# Patient Record
Sex: Male | Born: 1945 | Race: White | Hispanic: No | Marital: Married | Smoking: Current every day smoker
Health system: Southern US, Community
[De-identification: ages and names within clinical notes are randomized; demographics above are authoritative.]

## PROBLEM LIST (undated history)

## (undated) DIAGNOSIS — I6789 Other cerebrovascular disease: Secondary | ICD-10-CM

## (undated) DIAGNOSIS — M549 Dorsalgia, unspecified: Secondary | ICD-10-CM

## (undated) DIAGNOSIS — Z974 Presence of external hearing-aid: Secondary | ICD-10-CM

## (undated) DIAGNOSIS — G8929 Other chronic pain: Secondary | ICD-10-CM

## (undated) DIAGNOSIS — D719 Functional disorders of polymorphonuclear neutrophils, unspecified: Secondary | ICD-10-CM

## (undated) DIAGNOSIS — Z972 Presence of dental prosthetic device (complete) (partial): Secondary | ICD-10-CM

## (undated) DIAGNOSIS — I509 Heart failure, unspecified: Secondary | ICD-10-CM

## (undated) DIAGNOSIS — J439 Emphysema, unspecified: Secondary | ICD-10-CM

## (undated) DIAGNOSIS — I219 Acute myocardial infarction, unspecified: Secondary | ICD-10-CM

## (undated) DIAGNOSIS — I700 Atherosclerosis of aorta: Secondary | ICD-10-CM

## (undated) DIAGNOSIS — E785 Hyperlipidemia, unspecified: Secondary | ICD-10-CM

## (undated) DIAGNOSIS — I10 Essential (primary) hypertension: Secondary | ICD-10-CM

## (undated) DIAGNOSIS — G5601 Carpal tunnel syndrome, right upper limb: Secondary | ICD-10-CM

## (undated) DIAGNOSIS — K449 Diaphragmatic hernia without obstruction or gangrene: Secondary | ICD-10-CM

## (undated) DIAGNOSIS — Q7649 Other congenital malformations of spine, not associated with scoliosis: Secondary | ICD-10-CM

## (undated) DIAGNOSIS — M5416 Radiculopathy, lumbar region: Secondary | ICD-10-CM

## (undated) DIAGNOSIS — K219 Gastro-esophageal reflux disease without esophagitis: Secondary | ICD-10-CM

## (undated) DIAGNOSIS — G8314 Monoplegia of lower limb affecting left nondominant side: Secondary | ICD-10-CM

## (undated) DIAGNOSIS — M1990 Unspecified osteoarthritis, unspecified site: Secondary | ICD-10-CM

## (undated) DIAGNOSIS — L8990 Pressure ulcer of unspecified site, unspecified stage: Secondary | ICD-10-CM

## (undated) DIAGNOSIS — M5136 Other intervertebral disc degeneration, lumbar region: Secondary | ICD-10-CM

## (undated) DIAGNOSIS — J019 Acute sinusitis, unspecified: Secondary | ICD-10-CM

## (undated) DIAGNOSIS — E7800 Pure hypercholesterolemia, unspecified: Secondary | ICD-10-CM

## (undated) DIAGNOSIS — K08109 Complete loss of teeth, unspecified cause, unspecified class: Secondary | ICD-10-CM

## (undated) DIAGNOSIS — Z7902 Long term (current) use of antithrombotics/antiplatelets: Secondary | ICD-10-CM

## (undated) DIAGNOSIS — K7460 Unspecified cirrhosis of liver: Secondary | ICD-10-CM

## (undated) DIAGNOSIS — I2510 Atherosclerotic heart disease of native coronary artery without angina pectoris: Secondary | ICD-10-CM

## (undated) DIAGNOSIS — I429 Cardiomyopathy, unspecified: Secondary | ICD-10-CM

## (undated) DIAGNOSIS — H259 Unspecified age-related cataract: Secondary | ICD-10-CM

## (undated) DIAGNOSIS — K2970 Gastritis, unspecified, without bleeding: Secondary | ICD-10-CM

## (undated) DIAGNOSIS — H9190 Unspecified hearing loss, unspecified ear: Secondary | ICD-10-CM

## (undated) DIAGNOSIS — H6120 Impacted cerumen, unspecified ear: Secondary | ICD-10-CM

## (undated) DIAGNOSIS — I5030 Unspecified diastolic (congestive) heart failure: Secondary | ICD-10-CM

## (undated) DIAGNOSIS — I739 Peripheral vascular disease, unspecified: Secondary | ICD-10-CM

## (undated) DIAGNOSIS — G5600 Carpal tunnel syndrome, unspecified upper limb: Secondary | ICD-10-CM

## (undated) DIAGNOSIS — M1612 Unilateral primary osteoarthritis, left hip: Secondary | ICD-10-CM

## (undated) DIAGNOSIS — I209 Angina pectoris, unspecified: Secondary | ICD-10-CM

## (undated) DIAGNOSIS — K5790 Diverticulosis of intestine, part unspecified, without perforation or abscess without bleeding: Secondary | ICD-10-CM

## (undated) DIAGNOSIS — G629 Polyneuropathy, unspecified: Secondary | ICD-10-CM

## (undated) DIAGNOSIS — M795 Residual foreign body in soft tissue: Secondary | ICD-10-CM

## (undated) DIAGNOSIS — G4701 Insomnia due to medical condition: Secondary | ICD-10-CM

## (undated) DIAGNOSIS — R918 Other nonspecific abnormal finding of lung field: Secondary | ICD-10-CM

## (undated) DIAGNOSIS — K2289 Other specified disease of esophagus: Secondary | ICD-10-CM

## (undated) DIAGNOSIS — R0989 Other specified symptoms and signs involving the circulatory and respiratory systems: Secondary | ICD-10-CM

## (undated) DIAGNOSIS — M542 Cervicalgia: Secondary | ICD-10-CM

## (undated) DIAGNOSIS — M51369 Other intervertebral disc degeneration, lumbar region without mention of lumbar back pain or lower extremity pain: Secondary | ICD-10-CM

## (undated) DIAGNOSIS — I255 Ischemic cardiomyopathy: Secondary | ICD-10-CM

## (undated) DIAGNOSIS — D71 Functional disorders of polymorphonuclear neutrophils: Secondary | ICD-10-CM

## (undated) DIAGNOSIS — M5430 Sciatica, unspecified side: Secondary | ICD-10-CM

## (undated) DIAGNOSIS — M25539 Pain in unspecified wrist: Secondary | ICD-10-CM

## (undated) DIAGNOSIS — I639 Cerebral infarction, unspecified: Secondary | ICD-10-CM

## (undated) HISTORY — PX: NECK SURGERY: SHX720

## (undated) HISTORY — PX: SHOULDER OPEN ROTATOR CUFF REPAIR: SHX2407

## (undated) HISTORY — PX: EYE SURGERY: SHX253

## (undated) HISTORY — PX: APPENDECTOMY: SHX54

## (undated) HISTORY — PX: CARDIAC CATHETERIZATION: SHX172

## (undated) HISTORY — PX: BACK SURGERY: SHX140

## (undated) HISTORY — PX: CATARACT EXTRACTION W/ INTRAOCULAR LENS IMPLANT: SHX1309

## (undated) HISTORY — PX: COLONOSCOPY: SHX174

---

## 1989-06-03 HISTORY — PX: ANKLE RECONSTRUCTION: SHX1151

## 2007-12-11 ENCOUNTER — Other Ambulatory Visit: Payer: Self-pay

## 2007-12-12 ENCOUNTER — Observation Stay: Payer: Self-pay | Admitting: Internal Medicine

## 2007-12-12 ENCOUNTER — Other Ambulatory Visit: Payer: Self-pay

## 2009-02-16 ENCOUNTER — Emergency Department: Payer: Self-pay | Admitting: Emergency Medicine

## 2009-03-30 ENCOUNTER — Ambulatory Visit: Payer: Self-pay | Admitting: Gastroenterology

## 2009-08-09 ENCOUNTER — Observation Stay: Payer: Self-pay | Admitting: *Deleted

## 2009-08-09 DIAGNOSIS — I2510 Atherosclerotic heart disease of native coronary artery without angina pectoris: Secondary | ICD-10-CM

## 2009-08-09 DIAGNOSIS — I214 Non-ST elevation (NSTEMI) myocardial infarction: Secondary | ICD-10-CM

## 2009-08-09 HISTORY — DX: Atherosclerotic heart disease of native coronary artery without angina pectoris: I25.10

## 2009-08-09 HISTORY — PX: LEFT HEART CATH AND CORONARY ANGIOGRAPHY: CATH118249

## 2009-08-09 HISTORY — DX: Non-ST elevation (NSTEMI) myocardial infarction: I21.4

## 2009-08-15 ENCOUNTER — Ambulatory Visit: Payer: Self-pay | Admitting: Cardiovascular Disease

## 2009-08-31 ENCOUNTER — Encounter: Payer: Self-pay | Admitting: Internal Medicine

## 2009-09-01 ENCOUNTER — Encounter: Payer: Self-pay | Admitting: Internal Medicine

## 2009-10-01 ENCOUNTER — Encounter: Payer: Self-pay | Admitting: Internal Medicine

## 2009-11-01 ENCOUNTER — Encounter: Payer: Self-pay | Admitting: Internal Medicine

## 2009-11-18 ENCOUNTER — Observation Stay: Payer: Self-pay | Admitting: Internal Medicine

## 2009-12-01 ENCOUNTER — Encounter: Payer: Self-pay | Admitting: Internal Medicine

## 2010-01-01 ENCOUNTER — Encounter: Payer: Self-pay | Admitting: Internal Medicine

## 2010-03-28 ENCOUNTER — Observation Stay: Payer: Self-pay | Admitting: Internal Medicine

## 2010-03-29 ENCOUNTER — Emergency Department: Payer: Self-pay | Admitting: Emergency Medicine

## 2010-04-02 ENCOUNTER — Ambulatory Visit: Payer: Self-pay | Admitting: Gastroenterology

## 2010-04-09 ENCOUNTER — Ambulatory Visit: Payer: Self-pay | Admitting: Gastroenterology

## 2010-04-11 LAB — PATHOLOGY REPORT

## 2010-04-12 ENCOUNTER — Ambulatory Visit: Payer: Self-pay | Admitting: Gastroenterology

## 2010-05-20 ENCOUNTER — Inpatient Hospital Stay: Payer: Self-pay | Admitting: Internal Medicine

## 2010-05-23 LAB — PATHOLOGY REPORT

## 2010-06-14 ENCOUNTER — Ambulatory Visit: Payer: Self-pay | Admitting: General Surgery

## 2010-07-04 ENCOUNTER — Ambulatory Visit: Payer: Self-pay | Admitting: General Surgery

## 2010-08-17 ENCOUNTER — Emergency Department: Payer: Self-pay | Admitting: Emergency Medicine

## 2010-08-29 ENCOUNTER — Emergency Department: Payer: Self-pay | Admitting: Emergency Medicine

## 2010-09-20 ENCOUNTER — Encounter: Payer: Self-pay | Admitting: Anesthesiology

## 2010-10-02 ENCOUNTER — Encounter: Payer: Self-pay | Admitting: Anesthesiology

## 2011-09-01 LAB — BASIC METABOLIC PANEL
Anion Gap: 9 (ref 7–16)
BUN: 24 mg/dL — ABNORMAL HIGH (ref 7–18)
Chloride: 106 mmol/L (ref 98–107)
EGFR (African American): >60
EGFR (Non-African Amer.): >60
Glucose: 125 mg/dL — ABNORMAL HIGH (ref 65–99)
Osmolality: 294 (ref 275–301)
Sodium: 145 mmol/L (ref 136–145)

## 2011-09-01 LAB — CBC
HCT: 40.3 % (ref 40.0–52.0)
HGB: 13.7 g/dL (ref 13.0–18.0)
MCH: 32.8 pg (ref 26.0–34.0)
MCV: 97 fL (ref 80–100)
RBC: 4.18 10*6/uL — ABNORMAL LOW (ref 4.40–5.90)

## 2011-09-01 LAB — TROPONIN I: Troponin-I: <0.02 ng/mL

## 2011-09-02 ENCOUNTER — Observation Stay: Payer: Self-pay | Admitting: Specialist

## 2011-09-02 LAB — CK TOTAL AND CKMB (NOT AT ARMC)
CK, Total: 81 U/L (ref 35–232)
CK-MB: 1.2 ng/mL (ref 0.5–3.6)

## 2011-09-02 LAB — TROPONIN I: Troponin-I: <0.02 ng/mL

## 2012-02-27 ENCOUNTER — Emergency Department: Payer: Self-pay | Admitting: *Deleted

## 2012-02-27 LAB — COMPREHENSIVE METABOLIC PANEL
Albumin: 4.3 g/dL (ref 3.4–5.0)
Anion Gap: 9 (ref 7–16)
BUN: 14 mg/dL (ref 7–18)
Calcium, Total: 9.6 mg/dL (ref 8.5–10.1)
Chloride: 100 mmol/L (ref 98–107)
Co2: 29 mmol/L (ref 21–32)
EGFR (African American): >60
EGFR (Non-African Amer.): >60
Glucose: 98 mg/dL (ref 65–99)
Osmolality: 276 (ref 275–301)
Potassium: 4.1 mmol/L (ref 3.5–5.1)
SGOT(AST): 67 U/L — ABNORMAL HIGH (ref 15–37)
Sodium: 138 mmol/L (ref 136–145)
Total Protein: 7.9 g/dL (ref 6.4–8.2)

## 2012-02-27 LAB — CBC
HGB: 16.2 g/dL (ref 13.0–18.0)
MCH: 33.4 pg (ref 26.0–34.0)
MCV: 94 fL (ref 80–100)
Platelet: 254 10*3/uL (ref 150–440)
RBC: 4.86 10*6/uL (ref 4.40–5.90)
RDW: 13.6 % (ref 11.5–14.5)
WBC: 14.7 10*3/uL — ABNORMAL HIGH (ref 3.8–10.6)

## 2012-02-27 LAB — URINALYSIS, COMPLETE
Blood: NEGATIVE
Ketone: NEGATIVE
Nitrite: NEGATIVE
Ph: 6 (ref 4.5–8.0)
Protein: NEGATIVE
RBC,UR: NONE SEEN /HPF (ref 0–5)
Specific Gravity: 1.013 (ref 1.003–1.030)
WBC UR: 1 /HPF (ref 0–5)

## 2012-04-09 ENCOUNTER — Emergency Department: Payer: Self-pay | Admitting: Emergency Medicine

## 2012-04-09 LAB — COMPREHENSIVE METABOLIC PANEL
Alkaline Phosphatase: 120 U/L (ref 50–136)
Anion Gap: 8 (ref 7–16)
BUN: 20 mg/dL — ABNORMAL HIGH (ref 7–18)
Bilirubin,Total: 1 mg/dL (ref 0.2–1.0)
Chloride: 102 mmol/L (ref 98–107)
Co2: 29 mmol/L (ref 21–32)
Creatinine: 0.83 mg/dL (ref 0.60–1.30)
EGFR (African American): >60
EGFR (Non-African Amer.): >60
Potassium: 3.5 mmol/L (ref 3.5–5.1)
SGOT(AST): 58 U/L — ABNORMAL HIGH (ref 15–37)
Total Protein: 7.8 g/dL (ref 6.4–8.2)

## 2012-04-09 LAB — URINALYSIS, COMPLETE
Bacteria: NONE SEEN
Glucose,UR: NEGATIVE mg/dL (ref 0–75)
Ketone: NEGATIVE
Nitrite: NEGATIVE
Specific Gravity: 1.025 (ref 1.003–1.030)
WBC UR: 1 /HPF (ref 0–5)

## 2012-04-09 LAB — CBC
HGB: 15 g/dL (ref 13.0–18.0)
MCH: 34 pg (ref 26.0–34.0)
MCHC: 36.1 g/dL — ABNORMAL HIGH (ref 32.0–36.0)
Platelet: 231 10*3/uL (ref 150–440)
RDW: 13.5 % (ref 11.5–14.5)
WBC: 12.6 10*3/uL — ABNORMAL HIGH (ref 3.8–10.6)

## 2012-06-27 ENCOUNTER — Emergency Department: Payer: Self-pay | Admitting: Unknown Physician Specialty

## 2012-06-27 LAB — BASIC METABOLIC PANEL
BUN: 21 mg/dL — ABNORMAL HIGH (ref 7–18)
Co2: 25 mmol/L (ref 21–32)
Creatinine: 0.94 mg/dL (ref 0.60–1.30)
EGFR (African American): >60
EGFR (Non-African Amer.): >60
Glucose: 109 mg/dL — ABNORMAL HIGH (ref 65–99)
Osmolality: 277 (ref 275–301)
Sodium: 137 mmol/L (ref 136–145)

## 2012-06-28 LAB — CBC
HGB: 17.2 g/dL (ref 13.0–18.0)
MCHC: 36.1 g/dL — ABNORMAL HIGH (ref 32.0–36.0)
Platelet: 210 10*3/uL (ref 150–440)
RBC: 5.17 10*6/uL (ref 4.40–5.90)
RDW: 13.6 % (ref 11.5–14.5)

## 2012-06-28 LAB — DIFFERENTIAL
Comment - H1-Com1: NORMAL
Lymphocytes: 14 %
Monocytes: 10 %
Segmented Neutrophils: 72 %
Variant Lymphocyte - H1-Rlymph: 4 %

## 2012-08-01 HISTORY — PX: CORONARY ARTERY BYPASS GRAFT: SHX141

## 2013-06-03 DIAGNOSIS — E876 Hypokalemia: Secondary | ICD-10-CM

## 2013-06-03 DIAGNOSIS — I2510 Atherosclerotic heart disease of native coronary artery without angina pectoris: Secondary | ICD-10-CM

## 2013-06-03 HISTORY — DX: Atherosclerotic heart disease of native coronary artery without angina pectoris: I25.10

## 2013-06-03 HISTORY — DX: Hypokalemia: E87.6

## 2013-08-10 ENCOUNTER — Observation Stay: Payer: Self-pay | Admitting: Internal Medicine

## 2013-08-10 LAB — CK TOTAL AND CKMB (NOT AT ARMC)
CK, TOTAL: 71 U/L
CK, Total: 95 U/L
CK, Total: 85 U/L
CK-MB: 2.3 ng/mL (ref 0.5–3.6)
CK-MB: 1.6 ng/mL (ref 0.5–3.6)
CK-MB: 1.5 ng/mL (ref 0.5–3.6)

## 2013-08-10 LAB — CBC
HCT: 39.4 % — ABNORMAL LOW (ref 40.0–52.0)
HGB: 14 g/dL (ref 13.0–18.0)
MCH: 32.8 pg (ref 26.0–34.0)
MCHC: 35.4 g/dL (ref 32.0–36.0)
MCV: 93 fL (ref 80–100)
Platelet: 223 10*3/uL (ref 150–440)
RBC: 4.26 10*6/uL — ABNORMAL LOW (ref 4.40–5.90)
RDW: 13.7 % (ref 11.5–14.5)
WBC: 7.6 10*3/uL (ref 3.8–10.6)

## 2013-08-10 LAB — BASIC METABOLIC PANEL
Anion Gap: 4 — ABNORMAL LOW (ref 7–16)
BUN: 22 mg/dL — ABNORMAL HIGH (ref 7–18)
CO2: 29 mmol/L (ref 21–32)
CREATININE: 0.87 mg/dL (ref 0.60–1.30)
Calcium, Total: 9 mg/dL (ref 8.5–10.1)
Chloride: 105 mmol/L (ref 98–107)
EGFR (African American): >60
Glucose: 142 mg/dL — ABNORMAL HIGH (ref 65–99)
Osmolality: 281 (ref 275–301)
Potassium: 4 mmol/L (ref 3.5–5.1)
SODIUM: 138 mmol/L (ref 136–145)

## 2013-08-10 LAB — TROPONIN I
Troponin-I: <0.02 ng/mL
Troponin-I: <0.02 ng/mL

## 2013-08-10 LAB — PRO B NATRIURETIC PEPTIDE: B-Type Natriuretic Peptide: 77 pg/mL (ref 0–125)

## 2013-08-11 HISTORY — PX: LEFT HEART CATH AND CORONARY ANGIOGRAPHY: CATH118249

## 2013-08-11 LAB — LIPID PANEL
Cholesterol: 193 mg/dL (ref 0–200)
HDL Cholesterol: 32 mg/dL — ABNORMAL LOW (ref 40–60)
Ldl Cholesterol, Calc: 124 mg/dL — ABNORMAL HIGH (ref 0–100)
TRIGLYCERIDES: 186 mg/dL (ref 0–200)
VLDL Cholesterol, Calc: 37 mg/dL (ref 5–40)

## 2013-08-11 LAB — HEMOGLOBIN A1C: HEMOGLOBIN A1C: 5.9 % (ref 4.2–6.3)

## 2013-08-11 LAB — HEPATIC FUNCTION PANEL A (ARMC)
ALBUMIN: 3.9 g/dL (ref 3.4–5.0)
ALT: 61 U/L (ref 12–78)
Alkaline Phosphatase: 105 U/L
BILIRUBIN DIRECT: 0.1 mg/dL (ref 0.00–0.20)
BILIRUBIN TOTAL: 0.7 mg/dL (ref 0.2–1.0)
SGOT(AST): 45 U/L — ABNORMAL HIGH (ref 15–37)
Total Protein: 7.8 g/dL (ref 6.4–8.2)

## 2013-08-11 LAB — TSH: Thyroid Stimulating Horm: 2.54 u[IU]/mL

## 2013-08-12 LAB — BASIC METABOLIC PANEL
Anion Gap: 1 — ABNORMAL LOW (ref 7–16)
BUN: 15 mg/dL (ref 7–18)
Calcium, Total: 8.7 mg/dL (ref 8.5–10.1)
Chloride: 100 mmol/L (ref 98–107)
Co2: 32 mmol/L (ref 21–32)
Creatinine: 0.88 mg/dL (ref 0.60–1.30)
EGFR (African American): >60
EGFR (Non-African Amer.): >60
Glucose: 172 mg/dL — ABNORMAL HIGH (ref 65–99)
OSMOLALITY: 271 (ref 275–301)
POTASSIUM: 4.3 mmol/L (ref 3.5–5.1)
Sodium: 133 mmol/L — ABNORMAL LOW (ref 136–145)

## 2013-08-19 DIAGNOSIS — K2289 Other specified disease of esophagus: Secondary | ICD-10-CM | POA: Insufficient documentation

## 2013-08-19 DIAGNOSIS — I2510 Atherosclerotic heart disease of native coronary artery without angina pectoris: Secondary | ICD-10-CM | POA: Insufficient documentation

## 2013-08-19 DIAGNOSIS — K228 Other specified diseases of esophagus: Secondary | ICD-10-CM | POA: Insufficient documentation

## 2013-08-19 DIAGNOSIS — H9190 Unspecified hearing loss, unspecified ear: Secondary | ICD-10-CM

## 2013-08-19 DIAGNOSIS — IMO0001 Reserved for inherently not codable concepts without codable children: Secondary | ICD-10-CM

## 2013-08-19 DIAGNOSIS — G8929 Other chronic pain: Secondary | ICD-10-CM | POA: Insufficient documentation

## 2013-08-19 DIAGNOSIS — M5441 Lumbago with sciatica, right side: Secondary | ICD-10-CM

## 2013-08-20 DIAGNOSIS — Z951 Presence of aortocoronary bypass graft: Secondary | ICD-10-CM

## 2013-08-20 HISTORY — PX: CORONARY ARTERY BYPASS GRAFT: SHX141

## 2013-08-20 HISTORY — DX: Presence of aortocoronary bypass graft: Z95.1

## 2013-08-21 DIAGNOSIS — Z951 Presence of aortocoronary bypass graft: Secondary | ICD-10-CM | POA: Insufficient documentation

## 2013-08-23 DIAGNOSIS — I498 Other specified cardiac arrhythmias: Secondary | ICD-10-CM | POA: Insufficient documentation

## 2013-08-28 LAB — CBC
HCT: 33 % — ABNORMAL LOW (ref 40.0–52.0)
HGB: 11.1 g/dL — ABNORMAL LOW (ref 13.0–18.0)
MCH: 31.3 pg (ref 26.0–34.0)
MCHC: 33.7 g/dL (ref 32.0–36.0)
MCV: 93 fL (ref 80–100)
Platelet: 302 10*3/uL (ref 150–440)
RBC: 3.56 10*6/uL — ABNORMAL LOW (ref 4.40–5.90)
RDW: 14.8 % — ABNORMAL HIGH (ref 11.5–14.5)
WBC: 12 10*3/uL — ABNORMAL HIGH (ref 3.8–10.6)

## 2013-08-28 LAB — BASIC METABOLIC PANEL
ANION GAP: 4 — AB (ref 7–16)
BUN: 17 mg/dL (ref 7–18)
CO2: 30 mmol/L (ref 21–32)
CREATININE: 0.92 mg/dL (ref 0.60–1.30)
Calcium, Total: 9 mg/dL (ref 8.5–10.1)
Chloride: 99 mmol/L (ref 98–107)
EGFR (African American): >60
EGFR (Non-African Amer.): >60
Glucose: 107 mg/dL — ABNORMAL HIGH (ref 65–99)
Osmolality: 268 (ref 275–301)
Potassium: 4.2 mmol/L (ref 3.5–5.1)
Sodium: 133 mmol/L — ABNORMAL LOW (ref 136–145)

## 2013-08-28 LAB — TROPONIN I
TROPONIN-I: 0.05 ng/mL
Troponin-I: 0.04 ng/mL

## 2013-08-28 LAB — PRO B NATRIURETIC PEPTIDE: B-Type Natriuretic Peptide: 751 pg/mL — ABNORMAL HIGH (ref 0–125)

## 2013-08-28 LAB — MAGNESIUM: Magnesium: 2.2 mg/dL

## 2013-08-29 ENCOUNTER — Inpatient Hospital Stay: Payer: Self-pay | Admitting: Internal Medicine

## 2013-08-29 LAB — TROPONIN I: Troponin-I: 0.03 ng/mL

## 2013-08-30 LAB — URINALYSIS, COMPLETE
Bilirubin,UR: NEGATIVE
Blood: NEGATIVE
Glucose,UR: NEGATIVE mg/dL (ref 0–75)
Hyaline Cast: 2
Ketone: NEGATIVE
Leukocyte Esterase: NEGATIVE
NITRITE: NEGATIVE
Ph: 6 (ref 4.5–8.0)
Protein: NEGATIVE
RBC,UR: NONE SEEN /HPF (ref 0–5)
SPECIFIC GRAVITY: 1.011 (ref 1.003–1.030)
Squamous Epithelial: NONE SEEN
WBC UR: NONE SEEN /HPF (ref 0–5)

## 2013-08-30 LAB — BASIC METABOLIC PANEL
ANION GAP: 5 — AB (ref 7–16)
BUN: 25 mg/dL — ABNORMAL HIGH (ref 7–18)
CREATININE: 1.26 mg/dL (ref 0.60–1.30)
Calcium, Total: 8.8 mg/dL (ref 8.5–10.1)
Chloride: 95 mmol/L — ABNORMAL LOW (ref 98–107)
Co2: 31 mmol/L (ref 21–32)
EGFR (African American): >60
GFR CALC NON AF AMER: 59 — AB
Glucose: 109 mg/dL — ABNORMAL HIGH (ref 65–99)
Osmolality: 268 (ref 275–301)
Potassium: 4.1 mmol/L (ref 3.5–5.1)
Sodium: 131 mmol/L — ABNORMAL LOW (ref 136–145)

## 2013-08-30 LAB — CBC WITH DIFFERENTIAL/PLATELET
BASOS PCT: 0.5 %
Basophil #: 0.1 10*3/uL (ref 0.0–0.1)
Eosinophil #: 0.6 10*3/uL (ref 0.0–0.7)
Eosinophil %: 4.4 %
HCT: 33 % — ABNORMAL LOW (ref 40.0–52.0)
HGB: 11 g/dL — ABNORMAL LOW (ref 13.0–18.0)
LYMPHS PCT: 20.7 %
Lymphocyte #: 2.8 10*3/uL (ref 1.0–3.6)
MCH: 30.9 pg (ref 26.0–34.0)
MCHC: 33.4 g/dL (ref 32.0–36.0)
MCV: 93 fL (ref 80–100)
Monocyte #: 1.2 x10 3/mm — ABNORMAL HIGH (ref 0.2–1.0)
Monocyte %: 9 %
NEUTROS PCT: 65.4 %
Neutrophil #: 9 10*3/uL — ABNORMAL HIGH (ref 1.4–6.5)
Platelet: 357 10*3/uL (ref 150–440)
RBC: 3.56 10*6/uL — ABNORMAL LOW (ref 4.40–5.90)
RDW: 14.7 % — ABNORMAL HIGH (ref 11.5–14.5)
WBC: 13.7 10*3/uL — AB (ref 3.8–10.6)

## 2013-08-31 LAB — BASIC METABOLIC PANEL
Anion Gap: 3 — ABNORMAL LOW (ref 7–16)
BUN: 21 mg/dL — AB (ref 7–18)
CREATININE: 0.9 mg/dL (ref 0.60–1.30)
Calcium, Total: 8.8 mg/dL (ref 8.5–10.1)
Chloride: 100 mmol/L (ref 98–107)
Co2: 30 mmol/L (ref 21–32)
EGFR (African American): >60
Glucose: 106 mg/dL — ABNORMAL HIGH (ref 65–99)
Osmolality: 270 (ref 275–301)
Potassium: 4.2 mmol/L (ref 3.5–5.1)
SODIUM: 133 mmol/L — AB (ref 136–145)

## 2013-08-31 LAB — CBC WITH DIFFERENTIAL/PLATELET
Basophil #: 0 10*3/uL (ref 0.0–0.1)
Basophil %: 0.5 %
EOS ABS: 0.5 10*3/uL (ref 0.0–0.7)
EOS PCT: 6.1 %
HCT: 29.9 % — ABNORMAL LOW (ref 40.0–52.0)
HGB: 10.3 g/dL — AB (ref 13.0–18.0)
LYMPHS ABS: 2.3 10*3/uL (ref 1.0–3.6)
Lymphocyte %: 25.9 %
MCH: 31.3 pg (ref 26.0–34.0)
MCHC: 34.4 g/dL (ref 32.0–36.0)
MCV: 91 fL (ref 80–100)
MONOS PCT: 8.9 %
Monocyte #: 0.8 x10 3/mm (ref 0.2–1.0)
NEUTROS PCT: 58.6 %
Neutrophil #: 5.1 10*3/uL (ref 1.4–6.5)
Platelet: 334 10*3/uL (ref 150–440)
RBC: 3.29 10*6/uL — ABNORMAL LOW (ref 4.40–5.90)
RDW: 14.3 % (ref 11.5–14.5)
WBC: 8.8 10*3/uL (ref 3.8–10.6)

## 2013-08-31 LAB — MAGNESIUM: MAGNESIUM: 2.3 mg/dL

## 2013-09-06 LAB — COMPREHENSIVE METABOLIC PANEL
ALBUMIN: 3.1 g/dL — AB (ref 3.4–5.0)
ALT: 34 U/L (ref 12–78)
AST: 28 U/L (ref 15–37)
Alkaline Phosphatase: 121 U/L — ABNORMAL HIGH
Anion Gap: 6 — ABNORMAL LOW (ref 7–16)
BUN: 19 mg/dL — ABNORMAL HIGH (ref 7–18)
Bilirubin,Total: 0.3 mg/dL (ref 0.2–1.0)
CHLORIDE: 102 mmol/L (ref 98–107)
CREATININE: 0.87 mg/dL (ref 0.60–1.30)
Calcium, Total: 8.9 mg/dL (ref 8.5–10.1)
Co2: 28 mmol/L (ref 21–32)
EGFR (African American): >60
EGFR (Non-African Amer.): >60
Glucose: 118 mg/dL — ABNORMAL HIGH (ref 65–99)
OSMOLALITY: 275 (ref 275–301)
POTASSIUM: 3.8 mmol/L (ref 3.5–5.1)
SODIUM: 136 mmol/L (ref 136–145)
Total Protein: 7.2 g/dL (ref 6.4–8.2)

## 2013-09-06 LAB — CBC
HCT: 32.7 % — AB (ref 40.0–52.0)
HGB: 11.3 g/dL — ABNORMAL LOW (ref 13.0–18.0)
MCH: 31.1 pg (ref 26.0–34.0)
MCHC: 34.5 g/dL (ref 32.0–36.0)
MCV: 90 fL (ref 80–100)
Platelet: 407 10*3/uL (ref 150–440)
RBC: 3.63 10*6/uL — ABNORMAL LOW (ref 4.40–5.90)
RDW: 14.7 % — ABNORMAL HIGH (ref 11.5–14.5)
WBC: 6.9 10*3/uL (ref 3.8–10.6)

## 2013-09-06 LAB — URINALYSIS, COMPLETE
BACTERIA: NONE SEEN
BILIRUBIN, UR: NEGATIVE
Blood: NEGATIVE
Glucose,UR: NEGATIVE mg/dL (ref 0–75)
KETONE: NEGATIVE
LEUKOCYTE ESTERASE: NEGATIVE
NITRITE: NEGATIVE
Ph: 7 (ref 4.5–8.0)
Protein: NEGATIVE
RBC,UR: NONE SEEN /HPF (ref 0–5)
Specific Gravity: 1.015 (ref 1.003–1.030)
WBC UR: NONE SEEN /HPF (ref 0–5)

## 2013-09-06 LAB — PROTIME-INR
INR: 1
PROTHROMBIN TIME: 12.9 s (ref 11.5–14.7)

## 2013-09-06 LAB — TROPONIN I

## 2013-09-06 LAB — PRO B NATRIURETIC PEPTIDE: B-TYPE NATIURETIC PEPTID: 410 pg/mL — AB (ref 0–125)

## 2013-09-07 ENCOUNTER — Observation Stay: Payer: Self-pay | Admitting: Internal Medicine

## 2013-09-07 LAB — CK TOTAL AND CKMB (NOT AT ARMC)
CK, Total: 34 U/L — ABNORMAL LOW
CK, Total: 36 U/L — ABNORMAL LOW
CK-MB: 1.2 ng/mL (ref 0.5–3.6)
CK-MB: 1.5 ng/mL (ref 0.5–3.6)

## 2013-09-07 LAB — CK-MB: CK-MB: 1.1 ng/mL (ref 0.5–3.6)

## 2013-09-07 LAB — TROPONIN I
Troponin-I: <0.02 ng/mL
Troponin-I: <0.02 ng/mL

## 2013-10-13 ENCOUNTER — Encounter: Payer: Self-pay | Admitting: Internal Medicine

## 2013-10-20 DIAGNOSIS — R079 Chest pain, unspecified: Secondary | ICD-10-CM | POA: Insufficient documentation

## 2013-11-01 ENCOUNTER — Encounter: Payer: Self-pay | Admitting: Internal Medicine

## 2013-12-15 ENCOUNTER — Ambulatory Visit: Payer: Self-pay | Admitting: Family Medicine

## 2014-03-03 HISTORY — PX: CARPAL TUNNEL RELEASE: SHX101

## 2014-06-03 DIAGNOSIS — Z86018 Personal history of other benign neoplasm: Secondary | ICD-10-CM

## 2014-06-03 DIAGNOSIS — M549 Dorsalgia, unspecified: Secondary | ICD-10-CM

## 2014-06-03 DIAGNOSIS — M7989 Other specified soft tissue disorders: Secondary | ICD-10-CM

## 2014-06-03 HISTORY — DX: Other specified soft tissue disorders: M79.89

## 2014-06-03 HISTORY — DX: Dorsalgia, unspecified: M54.9

## 2014-06-03 HISTORY — DX: Personal history of other benign neoplasm: Z86.018

## 2014-06-15 DIAGNOSIS — J029 Acute pharyngitis, unspecified: Secondary | ICD-10-CM | POA: Diagnosis not present

## 2014-07-13 DIAGNOSIS — Z9889 Other specified postprocedural states: Secondary | ICD-10-CM | POA: Diagnosis not present

## 2014-07-20 DIAGNOSIS — M25532 Pain in left wrist: Secondary | ICD-10-CM | POA: Diagnosis not present

## 2014-07-20 DIAGNOSIS — Z9889 Other specified postprocedural states: Secondary | ICD-10-CM | POA: Diagnosis not present

## 2014-07-20 DIAGNOSIS — Z4789 Encounter for other orthopedic aftercare: Secondary | ICD-10-CM | POA: Diagnosis not present

## 2014-07-20 DIAGNOSIS — G5602 Carpal tunnel syndrome, left upper limb: Secondary | ICD-10-CM | POA: Diagnosis not present

## 2014-08-01 DIAGNOSIS — T502X5A Adverse effect of carbonic-anhydrase inhibitors, benzothiadiazides and other diuretics, initial encounter: Secondary | ICD-10-CM | POA: Diagnosis not present

## 2014-08-01 DIAGNOSIS — E876 Hypokalemia: Secondary | ICD-10-CM | POA: Diagnosis not present

## 2014-08-01 DIAGNOSIS — G8929 Other chronic pain: Secondary | ICD-10-CM | POA: Diagnosis not present

## 2014-08-01 DIAGNOSIS — R600 Localized edema: Secondary | ICD-10-CM | POA: Diagnosis not present

## 2014-08-01 DIAGNOSIS — M545 Low back pain: Secondary | ICD-10-CM | POA: Diagnosis not present

## 2014-08-31 DIAGNOSIS — Z9889 Other specified postprocedural states: Secondary | ICD-10-CM | POA: Diagnosis not present

## 2014-09-24 NOTE — Discharge Summary (Signed)
PATIENT NAME:  [name] [name], [name] [name] MR#:  [id number] DATE OF BIRTH:  09/30/1945  DATE OF ADMISSION:  08/10/2013 DATE OF DISCHARGE:  08/12/2013  ADMITTING DIAGNOSIS:  Chest pain.   DISCHARGE DIAGNOSES: 1.  Unstable angina status post cardiac catheterization on 08/11/2013 by Dr. Lady Gary.  Chronically occluded laser Doppler anemometry as well as phase Doppler anemometry was noted and new 80% obstruction of proximal left circumflex.  Surgical consultation was recommended.  The patient is to see [facility] cardio surgeon in the next few days after discharge.  2.  Left ventricular function near-normal.  3.  Hyperlipidemia with LDL of 124.  4.  Relative hypotension.  5.  Hyperglycemia with hemoglobin A1c normal at 5.9.  No diabetes.  6.  Former tobacco abuse, recently quit.  7.  Obesity.  8.  History of coronary artery disease.  9.  Hypertension.   10.  Chronic back pain.  11.  Esophageal dysmotility.   DISCHARGE CONDITION:  Stable.   DISCHARGE MEDICATIONS:  The patient is to continue omeprazole 40 mg by mouth daily, dicyclomine 10 mg twice daily, aspirin 81 mg by mouth daily, Flexeril 1 tablet twice daily as needed.  Oxycodone 10 mg by mouth every six hours as needed, Colace 100 mg twice daily, nitroglycerin 0.4 mg sublingually every five minutes as needed, metoprolol tartrate 25 mg by mouth twice daily, this is a new medication.  Nicotine oral inhaler 1 inhalation every 1 to 2 hours as needed and Crestor 10 mg by mouth at bedtime.  The patient is not to take HCTZ triamterene.  HOME OXYGEN:  None.   DIET:  2 gram salt, low fat, low cholesterol, regular consistency.   ACTIVITY LIMITATIONS:  As tolerated.   FOLLOWUP APPOINTMENT:  With Dr. Lady Gary in two days after discharge.  [facility] cardio surgeon in the next 1 to 2 days after discharge.   CONSULTANTS:  Care management, social work, Dr. Lady Gary.   RADIOLOGIC STUDIES:  Chest x-ray, PA and lateral, 08/10/2013,  revealed findings which may reflect low-grade compensated CHF.  There is no evidence of pneumonia.  Overall the appearance of the chest has improved since the previous study according to radiology.  Carotid ultrasound 08/12/2013 revealed mild heterogeneous atherosclerotic plaque results in less than 50% diameter stenosis on the right.  On the left there is a trace atherosclerotic plaque without evidence of narrowing, tortuous left internal carotid artery.  This may represent the source of the patient's bruit.  Vertebral arteries are patent with normal antegrade flow according to radiology.  Cardiac catheterization 08/11/2013, ejection fraction calculated by contrast ventriculography was 40%.  There was significant triple-vessel coronary artery disease.  It appears that the patient will require coronary revascularization in the near future.  There is significant triple vessel coronary artery disease.  Coronary circulation, left main normal.  Mid LAD 100% stenosis.  There was TIMI grade 0 flow through the vesel, no flow.  A moderated size vascular territory distal to the lesion and poor collateral blood supply to the distal myocardium.  The lesion is chronically total occlusion.  Proximal circumflex there was 90% stenosis.  First obtuse marginal, the vessel was small size, there was discrete 75% stenosis at the ostium of the vessel segment.  There was TIMI grade III flow through the vessel, brisk flow.  Right PDA, there was 100% stenosis.  There was a small vascular territory distal to the lesion and good collateral blood supply to the distal myocardium.  This lesion is  chronic total occlusion.  Consultation with a cardiac surgeon was recommended for surgical opinion and coronary artery bypass grafting at [facility].   HOSPITAL COURSE:  The patient is a 69 year old Caucasian male with past medical history significant for history of hypertension, hyperlipidemia, obesity, coronary artery disease,  cardiomyopathy with ejection fraction of 35%, who presents to the hospital with complaints of chest pains.  Please refer to Dr. Jearl Klinefelter Patel's admission note on 08/10/2013.  On arrival to the hospital, the patient was afebrile, his pulse was 78, blood pressure 114/60, saturation was 97% on room air.  Physical exam was unremarkable.  The patient's EKG shows normal sinus rhythm with sinus arrhythmia.  No acute ST-T changes were noted.  Chest x-ray revealed possible low-grade compensated CHF with no evidence of pneumonia and improvement from prior study.  The patient's lab data done in the Emergency Room showed mild elevation of glucose to 142, BUN of 22.  Beta-type natriuretic peptide was 77, otherwise BMP was unremarkable.  The patient's cardiac enzymes x 3 were within normal limits.  TSH was normal at 2.54.  CBC:  White blood cell count 7.6, hemoglobin 14.0, platelet count 223.  The patient was admitted to the hospital for further evaluation.  His cardiac enzymes were cycled.  Risk factors were evaluated.  Lipid panel was performed.  LDL was found to be 124.  Total cholesterol was 193, triglycerides 186.  HDL was low at 32.  Hemoglobin A1c 5.9.  The patient was seen by Dr. Lady Gary who recommended cardiac catheterization.  The patient underwent catheterization on 08/11/2013 which showed triple vessel disease and recommendation for cardio surgery.  The patient was advised to be started on Crestor as well as metoprolol by cardiologist.  He was ambulated and since he did not have any significant discomfort he was ready to be discharged home.  He is to follow up with Dr. Lady Gary as well as cardio surgery at [facility] for further recommendations.   In regards to hyperlipidemia, as mentioned above, Crestor was initiated.   In regards to hyperglycemia, hemoglobin A1c was checked, was found to be 5.9.  No diabetes.    For tobacco abuse, apparently the patient recently quit, however he was willing to try nicotine inhalers to  decrease his craving which were prescribed for him upon discharge.    Past medical history of hypertension, chronic back pains, esophageal dysmotility, the patient is to continue his outpatient management.   In regards to obesity, the patient was advised to lose weight.   On the day of discharge, he felt satisfactory, did not complain of any significant pain.  His vital signs were stable with temperature of 97.8, pulse was 66, respiration rate was 18, blood pressure 101/56, saturation 96% on room air at rest.  TIME SPENT:  40 minutes.    ____________________________ Katharina Caper, MD rv:ea D: 08/12/2013 16:02:04 ET T: 08/13/2013 04:25:38 ET JOB#: [id number]  cc: Katharina Caper, MD, <Dictator> Darlin Priestly. Lady Gary, MD [facility] Hokulani Rogel MD ELECTRONICALLY SIGNED 09/01/2013 22:59

## 2014-09-24 NOTE — H&P (Signed)
PATIENT NAME:  [name] [name], [name] [name] MR#:  [id number] DATE OF BIRTH:  06/24/1945  DATE OF ADMISSION:  08/10/2013  PRIMARY CARE PHYSICIAN: Dr. Sherryll Burger [facility]   CARDIOLOGIST: Dr. Gwen Pounds  CHIEF COMPLAINT: Chest pain today.   HISTORY OF PRESENT ILLNESS: [name] [name] is a 69 year old morbidly obese Caucasian gentleman with history of coronary artery disease with history of cardiomyopathy, EF of 35% by cardiac catheterization in 2011, hypertension, hyperlipidemia, chronic back pain, and migraine headaches who comes to the Emergency Room after he started having chest pain, more left substernal and radiating to the left arm with feeling "flushed" at work today. The patient states he did not have any chest pain for a long time. He continued to feel chest tightness even despite getting rest after work and decided to come to the Emergency Room. He received nitroglycerin and aspirin. His pain is 2/10 at this time. His pain initially at work was 8/10. The patient is being admitted for further evaluation and management. His EKG shows normal sinus rhythm without any ST elevation or depression. His first set of cardiac enzymes are negative.   PAST MEDICAL HISTORY: 1.  Coronary artery disease with MI status post cardiac cath, last one was in February 2011, along with EF of 35%.  2.  Hypertension.  3.  Hyperlipidemia.  4.  Chronic back pain.  5.  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  6.  Esophageal dysmotility and mild gastritis.  7.  Migraine headaches.  8.  Hepatic steatosis.   PAST SURGICAL HISTORY: 1.  Bilateral cataract surgery.  2.  Low back surgery.  3.  Neck surgery.  4.  Cholecystectomy.   ALLERGIES: PARAFON FORTE.  SOCIAL HISTORY: He lives in [place] with his wife. He quit smoking after 57 years, on 06/22/2013. Denies any drug use.   FAMILY HISTORY: Sister with diabetes.   HOME MEDICATIONS: 1.  Oxycodone 10 mg  p.o. every 6 hours as needed.  2.  Omeprazole 40 mg daily.  3.  Hydrochlorothiazide/triamterene 25/37.5 mg p.o. daily.  4.  Flexeril 1 tablet b.i.d.  5.  Dicyclomine 10 mg 1 capsule b.i.d.  6.  Colace 100 mg b.i.d.  7.  Aspirin 81 mg daily.   REVIEW OF SYSTEMS: CONSTITUTIONAL: No fever, fatigue, weakness or pain.  EYES: No blurred or double vision, glaucoma or cataracts.  EARS, NOSE, THROAT: No tinnitus, ear pain, hearing loss, postnasal drip.  RESPIRATORY: No cough, wheeze, hemoptysis, dyspnea, or painful respiration.  CARDIOVASCULAR: Positive for chest pain. No hypertension. No syncope or palpitations.  GASTROINTESTINAL: No nausea, vomiting, diarrhea, abdominal pain, hematemesis or GERD.  GENITOURINARY: No dysuria, hematuria, renal calculus, or frequency.  ENDOCRINE: No polyuria, nocturia, or thyroid problems.  HEMATOLOGY: No anemia, easy bruising or bleeding.  SKIN: No acne or rash.  MUSCULOSKELETAL: Positive for arthritis and chronic back pain. No swelling or gout.  NEUROLOGIC: No CVA, TIA, ataxia or dementia. PSYCH: No anxiety or depression or bipolar disorder. All other systems reviewed and negative.   PHYSICAL EXAMINATION: GENERAL: The patient is awake, alert, and oriented x3, not in acute distress.  VITAL SIGNS: Afebrile. Pulse is 78. Blood pressure is 114/60 and sats are 97% on room air.  HEENT: Atraumatic, normocephalic. Pupils are equal, round and reactive to light and accommodation. EOM intact. Oral mucosa is moist.  NECK: Supple. No JVD. No carotid bruit.  LUNGS: Clear to auscultation bilaterally. No rales, rhonchi, respiratory distress or labored breathing.  HEART: Both the heart sounds  are normal. Rate, rhythm regular. PMI not lateralized. Chest is nontender.  EXTREMITIES: Good pedal pulses, good femoral pulses. No lower extremity edema.  ABDOMEN: Soft, benign, nontender. No organomegaly. Positive bowel sounds.  NEUROLOGIC: Grossly intact cranial nerves II through XII.  No motor or sensory deficit.  PSYCH: The patient is awake, alert, and oriented x3.   DIAGNOSTIC DATA: EKG shows sinus rhythm with sinus arrhythmia. No acute ST elevation or depression.   Chest x-ray: No acute cardiopulmonary abnormality.   Troponin is less than 0.02. CBC within normal limits. Basic metabolic panel within normal limits, except glucose of 142. BNP is 77.   ASSESSMENT: 69 year old [name] [name] with history of coronary artery disease status post myocardial infarction in the past with history of hypertension, hyperlipidemia, and ischemic cardiomyopathy who comes in with:  1.  Unstable angina. The patient is status post aspirin and nitroglycerin in the Emergency Room. Currently is chest pain-free. His first set of cardiac enzymes are negative. EKG does not show any acute changes. Will admit the patient on telemetry floor, give nitroglycerin p.r.n. His blood pressure has relative hypotension and hence we will hold off on beta blockers. His last cardiac catheterization was in February 2011. Will obtain cardiology consultation for further evaluation and management. Continue to cycle cardiac enzymes x3.  2.  Relative hypotension. The patient is asymptomatic at this time. However, I will not start beta blockers. Will continue his hydrochlorothiazide/triamterene. Nitroglycerin will be given as p.r.n.  3.  Ischemic cardiomyopathy. The patient is euvolemic. EF by cardiac catheterization was 35% to 45%. We will get echo of the heart.  4.  Chronic back pain. Resume his oxycodone.  5.  Gastroesophageal reflux disease. Continue Protonix while the patient is in house.  6.  Deep vein thrombosis prophylaxis with subcutaneous heparin.   Further work-up according to the patient's clinical course. Hospital admission plan was discussed with the patient and the patient's wife who was present in the Emergency Room.   TIME SPENT: 50 minutes.   ____________________________ Wylie Hail Allena Katz,  MD sap:sb D: 08/10/2013 16:00:37 ET T: 08/10/2013 16:22:40 ET JOB#: [id number]  cc: Bryndle Corredor A. Allena Katz, MD, <Dictator> Dr. Sherryll Burger [facility] Lamar Blinks, MD Willow Ora MD ELECTRONICALLY SIGNED 08/17/2013 15:08

## 2014-09-24 NOTE — Discharge Summary (Signed)
PATIENT NAME:  [name] [name], [name] [name] MR#:  [id number] DATE OF BIRTH:  12/20/1945  DATE OF ADMISSION:  08/29/2013 DATE OF DISCHARGE:  08/31/2013   PRIMARY CARE PHYSICIAN: None local.   CONSULTATIONS: Dr. Lady Gary.   DISCHARGE DIAGNOSES: 1.  New acute diastolic congestive heart failure.  2.  Coronary artery disease.   CONDITION: Stable.   CODE STATUS: Full code.   HOME MEDICATIONS: Please refer to the medication reconciliation list.   DIET: Low-sodium, low-fat, low-cholesterol diet.   ACTIVITY: As tolerated.   FOLLOWUP CARE: Follow up with PCP within 1 to 2 weeks. Follow up with Dr. Gwen Pounds in 1 to 2 weeks.   REASON FOR ADMISSION: Shortness of breath.   HOSPITAL COURSE: The patient is a 69 year old Caucasian male with a history of coronary artery disease, status post CABG 1 week prior to this admission in [facility]. The patient was discharged from there but developed shortness of breath for 3 to 4 days. The patient denied any chest pain. For detailed history and physical examination, please refer to the admission note dictated by Dr. Clint Guy. On admission date, laboratory data showed BNP 751. Troponin 0.04. BUN 17, creatinine 0.92, sodium 133, potassium 4.2. WBC 12. Chest x-ray showed mild pulmonary edema with small amount of atelectasis with a small effusion in the left lobe.  1.  Acute diastolic CHF: After admission, the patient has been treated with Lasix. Dr. Clint Guy consulted [facility] cardiovascular surgeon, who suggested Lasix 40 mg b.i.d. However, since the patient's blood pressure was on the low side, we gave Lasix 30 mg b.i.d. IV. However, the patient's blood pressure decreased to lower than 100, so we held Lasix and decreased the dose to 20 mg b.i.d.  2.  Leukocytosis: The patient's chest x-ray did not show any infiltrates. Urinalysis was negative. Leukocytosis is possibly due to reaction, which has improved.  3.  CAD: The patient has been treated with aspirin. Since THE PATIENT IS  ALLERGIC TO STATIN, we did not start a statin. Coreg and lisinopril were added. However, since the patient's blood pressure is on the low side, Coreg and lisinopril were discontinued.  4.  Hyponatremia and dehydration, which is possibly due to Lasix, improved after decreasing Lasix dose and holding Lasix.   The patient's shortness of breath has improved. Leg edema has been improving. Leg edema is much better. He is off oxygen. Vital signs are stable. He is clinically stable and will be discharged to home today. I discussed the patient's discharge plan with the patient, nurse, case manager.   TIME SPENT: About 38 minutes.   ____________________________ Shaune Pollack, MD qc:jcm D: 08/31/2013 14:13:57 ET T: 08/31/2013 14:42:04 ET JOB#: [id number]  cc: Shaune Pollack, MD, <Dictator> Shaune Pollack MD ELECTRONICALLY SIGNED 08/31/2013 15:40

## 2014-09-24 NOTE — H&P (Signed)
PATIENT NAME:  ABHIK, GALINSKI MR#:  [id number] DATE OF BIRTH:  [birth year]  DATE OF ADMISSION:  09/07/2013   PRIMARY CARE PHYSICIAN:  Dr. Brayton El  REFERRING PHYSICIAN:   Dr. Margarita Grizzle.   CHIEF COMPLAINT: Chest pain.   HISTORY OF PRESENT ILLNESS: Mr. Roscoe, a 69 year old male who underwent coronary artery bypass grafting 5 weeks back, bypassing the  , gastroesophageal reflux disease, hypertension, hyperlipidemia, who had presented to the Emergency Department with the complaints of chest pain, shortness of breath for the last 2-3 days. The patient has been experiencing shortness of breath with any exertion which has been gradually getting worse. Today, he had to come into the Emergency Department. He started to experience severe pain in the left side of the chest. Concerning this, the patient is brought to the Emergency Department. Workup in the Emergency Department with an EKG and the cardiac enzymes were unremarkable. Patient states he usually has sternal pain, however, this is different from the sternal pain.  The patient denies having any cough, did not notice any increase of swelling in the lower extremities.   PAST MEDICAL HISTORY:  1. Gastroesophageal reflux disease. 2. 3. Chronic back pain.   PAST SURGICAL HISTORY:  1. Coronary artery disease status post bypass surgery. 2. Left ankle surgery.  3. Back surgery.   ALLERGIES:  1. PARAFON FORTE.  2. STATINS.   HOME MEDICATIONS:  1. Potassium chloride 20 mEq once a day.  2. Oxycodone 5 mg every 6 hours as needed.  3. Omeprazole 40 mg a day.  4. Nitroglycerin 0.4 mg every 5 minutes as needed.  5. Gabapentin 100 mg 3 times a day.  6. Lasix 20 mg 2 times a day.  7. Dicyclomine 10 mg 1 capsule 2 times a day.  8. Flexeril 10 mg 2 times a day.  9. Colace 100 mg 2 times a day.  10. Aspirin 81 mg daily.   SOCIAL HISTORY: Former smoker. Denies drinking alcohol or using illicit drugs.   FAMILY HISTORY: Diabetes mellitus.   REVIEW  OF SYSTEMS: CONSTITUTIONAL: Generalized weakness.  EYES: No change in vision.  EARS, NOSE, AND THROAT:  No change in hearing.  RESPIRATORY: Has shortness of breath.  CARDIOVASCULAR: Has chest pain. GASTROINTESTINAL: No nausea, vomiting, abdominal pain.  GENITOURINARY:   No dysuria, hematuria.  ENDOCRINE: No polyuria or polydipsia.  SKIN: No rash or lesions.  HEMATOLOGIC:  No easy bruising or bleeding.  MUSCULOSKELETAL: Has pain in his sternal area. NEUROLOGIC: No weakness or numbness in any part of the body.   PHYSICAL EXAMINATION:  GENERAL: This is a well-built, well-nourished, age-appropriate male lying down in the bed, not in distress.  VITAL SIGNS: Temperature 98.7, pulse 101,  blood pressure 123/67, respiratory rate of 16, oxygen saturation 97% on room air.  HEENT: Head normocephalic, atraumatic. There is no scleral icterus.  Conjunctivae  normal. Pupils equal and react to light. Extraocular movements are intact. Mucous membranes moist. No pharyngeal erythema.  NECK: Supple. No lymphadenopathy.  No JVD.  No carotid bruit.  CHEST: Has significant tenderness on this sternal area. Has well-healing  sternotomy site.   LUNGS:  Bibasilar crackles are heard.  HEART: S1, S2, regular, tachycardia.  ABDOMEN: Bowel sounds present. Soft, nontender, nondistended. Could not appreciate any hepatosplenomegaly.  EXTREMITIES: Indicates 4 stockings.  No obvious swelling is noted. NEUROLOGIC: The  patient is alert, oriented to place, person, and time. Cranial nerves II-XII intact. Motor 5/5 in upper and lower extremities.  LABORATORY DATA:  Urinalysis negative for  nitrites and leukocyte esterase. CK-MB 1.1, troponin less than 0.02. BMP is completely within normal limits.    CBC is completely within normal limits. The BNP 410.    RADIOLOGICAL DATA:  Chest x-ray, one view portable: Changes without evidence of a pleural effusion, evidence of pneumonia.   ASSESSMENT AND PLAN: Mr. Conover is a  69 year old male who comes to the Emergency Department with complaints of chest pain, shortness of breath.  1. Chest pain: Differential diagnosis is a musculoskeletal pain from sternotomy, Dressler's  syndrome as patient had coronary artery bypass grafting with a possibility of pericardial pain. Also possibility of angina from the distal disease. Admit the patient to a monitored bed, cycle cardiac enzymes x 3. If negative, further workup is needed. Continue with the home medications.  2. Shortness of breath. Concerning about underlying congestive heart failure. Keep the patient on IV Lasix and follow up.  We will obtain echocardiogram.  3. Hypertension: Currently well controlled. Continue with the home medications.  4. Keep the patient on deep venous thrombosis prophylaxis with Lovenox.  TIME SPENT:  50 minutes.   ____________________________ Susa Griffins, MD pv:dd D: 09/07/2013 02:02:38 ET T: 09/07/2013 03:41:37 ET JOB#: [id number]  cc: Susa Griffins, MD, <Dictator> Nigel Bridgeman, MD   Clerance Lav Aaron Boeh MD ELECTRONICALLY SIGNED 09/16/2013 0:32

## 2014-09-24 NOTE — Consult Note (Signed)
Brief Consult Note: Diagnosis: 69 yo male with history of diffuse cad by cath in 2011 treated medically who was admitted with progressive chest pain with exetion.   Patient was seen by consultant.   Recommend further assessment or treatment.   Orders entered.   Comments: 69 yo with diffuse cad s/p cath in 2011 with diffuse cad treated medically. Now with progressive chest pain at rest and with exertion. Ruled ouit for mi. WIll need cardiac cath to evaluate anatomy to guide further therapy.  Electronic Signatures: Dalia Heading (MD)  (Signed 11-Mar-15 10:58)  Authored: Brief Consult Note   Last Updated: 11-Mar-15 10:58 by Dalia Heading (MD)

## 2014-09-24 NOTE — Consult Note (Signed)
   Present Illness 69 year old male with history of coronary artery disease status post coronary artery bypass grafting with cardiac catheterization showing three-vessel coronary disease. He underwent 5 vessel CABG in March of 2015. He also has hypertension. He has recently had been admitted with some chest discomfort. He ruled out for myocardial infarction. Yesterday he was at his surgeon's office where he had the staples removed. He returned home and was at a restaurant eating when he felt "like everything was closing in on him". He and these were called. They checked his vital signs which are normal. He complained of some chest discomfort and therefore was brought to the emergency room. Laboratories, EKG and chest x-ray were all unremarkable. He has ruled out for a myocardial infarction. He has some mild tenderness over the incision as well as over the LIMA takedown site.   Physical Exam:  GEN no acute distress, obese   HEENT PERRL, hearing intact to voice   NECK supple   RESP normal resp effort  clear BS  no use of accessory muscles   CARD Regular rate and rhythm  No murmur   ABD denies tenderness  no hernia  normal BS   LYMPH negative axillae   EXTR negative cyanosis/clubbing, negative edema   SKIN normal to palpation   NEURO cranial nerves intact, motor/sensory function intact   PSYCH A+O to time, place, person   Review of Systems:  Subjective/Chief Complaint "feeling like everything is closing in on him" mild left  lateral and rleft shoulder pain   General: Weakness   Skin: No Complaints   ENT: No Complaints   Eyes: No Complaints   Neck: No Complaints   Respiratory: No Complaints   Cardiovascular: Chest pain or discomfort   Gastrointestinal: No Complaints   Genitourinary: No Complaints   Vascular: No Complaints   Musculoskeletal: Muscle or joint pain   Neurologic: No Complaints   Hematologic: No Complaints   Endocrine: No Complaints   Psychiatric: No  Complaints   Review of Systems: All other systems were reviewed and found to be negative   Medications/Allergies Reviewed Medications/Allergies reviewed   EKG:  EKG NSR    Parafon Forte DSC: Hives, Rash  Statins: Other   Impression 69 year old male with coronary disease status post recent coronary artery bypass grafting. He is now readmitted with complaints of "everything closing in on him" shortness of breath weakness and fatigue. Electrocardiogram is unremarkable. Laboratories are unremarkable as is his chest x-ray. He has some mild tenderness over the incision as well as the LIMA takedown site. There is no evidence of heart failure, no evidence of arrhythmia, no evidence of ischemia. Would continue with current medications, ambulating consider discharge.   Plan 1. Continue with current medications   2. Ambulate 3. Physical therapy 4. Consider discharge   Electronic Signatures: Dalia Heading (MD)  (Signed 07-Apr-15 14:25)  Authored: General Aspect/Present Illness, History and Physical Exam, Review of System, EKG , Allergies, Impression/Plan   Last Updated: 07-Apr-15 14:25 by Dalia Heading (MD)

## 2014-09-24 NOTE — Discharge Summary (Signed)
PATIENT NAME:  [name] [name], [name] [name] MR#:  [id number] DATE OF BIRTH:  1945/11/30  DATE OF ADMISSION:  09/07/2013 DATE OF DISCHARGE:  09/07/2013  DISCHARGE DIAGNOSES: 1.  Left shoulder chest pain secondary to pulled muscle.  2.  Coronary artery disease status post CABG.  3.  Gastroesophageal reflux disease.  4.  Chronic back pain.   CONSULTANTS: Dr. Lady Gary with cardiology.   IMAGING STUDIES: Chest x-ray showed CABG changes, nothing acute.   ADMITTING HISTORY AND PHYSICAL AND HOSPITAL COURSE: Please see detailed H and P dictated previously. In brief, a 69 year old male patient with history of CAD status post CABG who presented to the hospital complaining of left shoulder chest pain. Was admitted to rule out acute coronary syndrome. The patient's cardiac enzymes were normal, was seen by Dr. Lady Gary who did not feel this is acute coronary syndrome. The patient's pain started after he tried to pull himself up with his left arm. This was pleuritic, getting worse on moving his left arm with muscle pain, and he is on pain medications which will be continued at discharge.   Prior to discharge, the patient had some pleuritic left shoulder pain. No chest pressure other than on his CABG scar.   DISCHARGE MEDICATIONS: 1.  Potassium chloride 20 mEq daily.  2.  Oxycodone 5 mg every 6 hours as needed.  3.  Omeprazole 40 mg.  4.  Nitroglycerin 0.4 every 5 minutes as needed.  5.  Gabapentin 100 mg 3 times a day.  6.  Lasix 20 mg 2 times a day.  7.  Dicyclomine 10 mg 1 capsule 2 times a day.  8.  Flexeril 10 mg 2 times a day.  9.  Colace 100 mg 2 times a day.  10.  Aspirin 81 mg daily.   DISCHARGE INSTRUCTIONS: Low-salt diet. Activity as tolerated. Follow up with primary care physician in 1 to 2 weeks.   TOTAL TIME SPENT: On day of discharge in discharge activity: 40 minutes.  ____________________________ Molinda Bailiff [name] Tienda, MD srs:sb D: 09/07/2013 17:04:20 ET T: 09/07/2013 17:19:46  ET JOB#: [id number]  cc: Wardell Heath R. Elpidio Anis, MD, <Dictator> Orie Fisherman MD ELECTRONICALLY SIGNED 09/14/2013 14:49

## 2014-09-24 NOTE — H&P (Signed)
PATIENT NAME:  [name] [name], RISDEN MR#:  [id number] DATE OF BIRTH:  [birth year]  DATE OF ADMISSION:  08/28/2013  REFERRING PHYSICIAN:  Physician's assistant Reita May.   PRIMARY CARE PHYSICIAN:  Dr. Sherryll Burger.  CHIEF COMPLAINT:  Shortness of breath.   HISTORY OF PRESENT ILLNESS:  A 69 year old Caucasian gentleman with past medical history of coronary artery disease status post CABG approximately one week ago at [facility] who is presenting with shortness of breath.  He describes a three to four day duration of shortness of breath mainly as dyspnea on exertion with associated nonproductive cough.  Denies fevers, chills; however, does complain of worsening lower extremity edema, orthopnea.  He is now sleeping in a chair as well as PND.  Denies any chest pain other than pain at incision site during coughing.  Denies any further complaints.  On arrival to the Emergency Department he was saturating in the low 90s on room air as well as complaining of pain.  He now has no complaints.  The case was discussed with [facility] who recommends diuresis and increasing home Lasix medication on discharge to 40 mg by mouth twice daily.   REVIEW OF SYSTEMS:  CONSTITUTIONAL:  Positive for fatigue, weakness.  Denies fevers, chills.  EYES:  Denies blurred vision, double vision, eye pain.  EAR, NOSE, THROAT:  Denies tinnitus, ear pain, hearing loss.  RESPIRATORY:  Positive for cough, shortness of breath, dyspnea on exertion.  CARDIOVASCULAR:  Denies chest pain.  Positive for orthopnea, edema, and PND.  GASTROINTESTINAL:  Denies nausea, vomiting, diarrhea or abdominal pain.  GENITOURINARY:  Denies dysuria, hematuria.  ENDOCRINE:  Denies nocturia or thyroid problems. HEMATOLOGY AND LYMPHATIC:  Denies easy bruising, bleeding.  SKIN:  Denies rash or lesions.  MUSCULOSKELETAL:  Denies pain in neck, back, shoulder, knees, hips or arthritic symptoms.  NEUROLOGIC:  Denies paralysis, paresthesias.  PSYCHIATRIC:  Denies anxiety or  depressive symptoms.  Otherwise, full review of systems performed by me is negative.   PAST MEDICAL HISTORY:  Coronary artery disease status post CABG approximately one week ago at [facility], gastroesophageal reflux disease, hyperlipidemia, hypertension.   SOCIAL HISTORY:  Remote tobacco abuse.  Denies any alcohol or drug usage.   FAMILY HISTORY:  Positive for diabetes.   ALLERGIES:  PARAFON FORTE AS WELL AS STATINS.   HOME MEDICATIONS:  Include aspirin 81 mg by mouth daily, oxycodone 5 mg by mouth q. 6 hours as needed for pain, nitroglycerin 0.4 mg sublingual every five minutes as needed for chest pain, Lasix 20 mg by mouth twice daily, Colace 100 mg by mouth twice daily, potassium 20 mEq by mouth daily, Flexeril 10 mg by mouth daily as needed for muscle spasm, Prilosec 40 mg by mouth daily.   PHYSICAL EXAMINATION: VITAL SIGNS:  Temperature 98.4, heart rate 86, respirations 24, blood pressure 134/60, saturating 98% on supplemental O2.  Weight 97.1 kg.  BMI 31.6.  GENERAL:  Well-nourished, well-developed, Caucasian gentleman, currently in minimal distress secondary to shortness of breath and pain.  HEAD:  Normocephalic, atraumatic.  EYES:  Pupils equal, round, reactive to light.  Extraocular muscles intact.  No scleral icterus.  MOUTH:  Moist mucous membranes.  Dentition intact.  No abscess noted.  EAR, NOSE, THROAT:  Throat clear without exudates.  No external lesions.  NECK:  Supple.  No thyromegaly.  No nodules.  Difficult to assess JVD, though not prominent.  PULMONARY:  Diffuse coarse breath sounds with bibasilar crackles.  No use of accessory muscles, though tachypneic.  Good respiratory  effort.  CHEST:  Tender to palpation as expected over incision site.  CARDIOVASCULAR:  S1, S2, regular rate and rhythm.  No murmurs, rubs, or gallops.  2+ edema to the thighs bilaterally.  Pedal pulses 2+ bilaterally.  GASTROINTESTINAL:  Soft, nontender, nondistended.  No masses.  Positive bowel sounds.  No   hepatosplenomegaly.  MUSCULOSKELETAL:  No swelling, clubbing.  Positive for edema as described above.  Range of motion full in all extremities.  NEUROLOGIC:  Cranial nerves II through XII intact.  No gross focal neurological deficits.  Sensation intact.  Reflexes intact.  SKIN:  No ulcerations, lesions, rashes, or cyanosis.  Incision on chest healing well, minimal erythema.  No discharge noted.  Skin warm, dry.  Turgor intact.  PSYCHIATRIC:  Mood and affect within normal limits.  The patient is awake, alert, oriented x 3.  Insight and judgment intact.   LABORATORY DATA:  An echocardiogram done prior to CABG 08/10/2013 revealing ejection fraction of 55% to 60%.  Remainder of laboratory data:  Sodium 133, potassium 4.2, chloride 99, bicarb 30, BUN 17, creatinine 0.92, glucose 107.  BNP 751, troponin I 0.04.  WBC 12, hemoglobin 11, platelets 302.  Chest x-ray performed, sternotomy wires, fluid in the fissure of the right lobe, bilateral interstitial infiltrate consistent with mild pulmonary edema and small amount of atelectasis with small effusion on left lobe.   ASSESSMENT AND PLAN:  A 69 year old Caucasian gentleman with history of coronary artery disease status post coronary artery bypass graft about a week ago presenting with shortness of breath.  1.  Acute congestive heart failure exacerbation.  Follow ins and outs, urine output, daily weights.  Diuresis with Lasix.  He has been given 20 mg intravenous already.  Trend cardiac enzymes.   Cardiology consult.  As for his cough we will provide Robitussin with codeine 10 mL q. 4 hours as needed.  Case discussed with cardiothoracic surgery at [facility] who are recommending increasing Lasix to 40 mg by mouth twice daily upon discharge.  2.  Systemic inflammatory response syndrome in the setting of coronary artery bypass graft and congestive heart failure, meeting systemic inflammatory response syndrome criteria by respiratory rate and leukocytosis.  No evidence of  infection at this time.  Hold antibiotics for now.  3.  Coronary artery disease status post coronary artery bypass graft.  We will continue aspirin and Lasix.  I question why he is not on any beta blocker or ACE inhibitors.  We will need to add these as blood pressure allows.  4.  Venous thromboembolism prophylaxis with heparin subQ.  5.  CODE STATUS:  THE PATIENT IS A FULL CODE.   TIME SPENT:  45 minutes.     ____________________________ Cletis Athens. Birdie Fetty, MD dkh:ea D: 08/28/2013 22:05:00 ET T: 08/29/2013 00:44:05 ET JOB#: [id number]  cc: Cletis Athens. Lyn Joens, MD, <Dictator> Aviannah Castoro Synetta Shadow MD ELECTRONICALLY SIGNED 08/29/2013 2:59

## 2014-09-24 NOTE — Consult Note (Signed)
   Present Illness patient is a 69 year old male with history of known coronary artery disease status post previous myocardial infarction. Cardiac catheterization in 2011 revealed a 30% proximal LAD with an occluded mid LAD. He had a 70% obtuse marginal one, 30% left circumflex stenosis. He had an occluded PDA with a 20% stenosis in the right coronary artery. This was treated medically. He was also treated for hypertension and hyperlipidemia. He has done fairly well since cardiac catheterization in 2011. More recently he began noting exertional chest pain which progressed to rest pain. He was midsternal with radiation to his arms. He denies syncope or presyncope. He continues to smoke but states he was compliant with his medications. He has ruled out for a myocardial infarction but continues to have midsternal rest chest pain   Physical Exam:  GEN no acute distress   HEENT PERRL, hearing intact to voice   NECK supple   RESP normal resp effort   CARD Regular rate and rhythm   ABD denies tenderness  denies Flank Tenderness  no hernia  normal BS   LYMPH negative axillae   EXTR negative cyanosis/clubbing, negative edema   SKIN normal to palpation   NEURO cranial nerves intact, motor/sensory function intact   PSYCH A+O to time, place, person   Review of Systems:  Subjective/Chief Complaint midsternal chest pain   General: Fatigue   Skin: No Complaints   ENT: No Complaints   Eyes: No Complaints   Neck: No Complaints   Respiratory: Short of breath   Cardiovascular: Chest pain or discomfort  Tightness   Gastrointestinal: No Complaints   Genitourinary: No Complaints   Vascular: No Complaints   Musculoskeletal: No Complaints   Neurologic: No Complaints   Hematologic: No Complaints   Endocrine: No Complaints   Psychiatric: No Complaints   Review of Systems: All other systems were reviewed and found to be negative   Medications/Allergies Reviewed Medications/Allergies  reviewed   EKG:  EKG NSR    Parafon Forte DSC: Hives, Rash   Impression 69 year old male with history of diffuse coronary disease by cardiac catheterization in 2011. Now admitted with chest pain. Has ruled out for myocardial infarction but has pain similar to his angina. Has progressed from moderate exertion to at rest. This has gone from [place] class 3-4. He was not on aggressive antianginal regimen as an outpatient is currently on beta blockers and nitrates and continues to have mild chest tightness.  He will need further evaluation to assess for any evidence of progression of his coronary disease. Given the diffuse nature of his previous catheter, surgical or percutaneous intervention may be limited however will need to assess whether this is the case.   Plan 1. Continue with current meds including metoprolol and nitrates 2. Risk and benefits left cardiac catheterization were explained the patient agrees to proceed 3. 3 left cardiac catheterization evaluate coronary anatomy with further recommendations after studies are complete   Electronic Signatures: Dalia Heading (MD)  (Signed 11-Mar-15 14:18)  Authored: General Aspect/Present Illness, History and Physical Exam, Review of System, EKG , Allergies, Impression/Plan   Last Updated: 11-Mar-15 14:18 by Dalia Heading (MD)

## 2014-09-25 NOTE — H&P (Signed)
PATIENT NAME:  [name] [name], [name] [name] MR#:  [id number] DATE OF BIRTH:  [birth year]  DATE OF ADMISSION:  09/02/2011  REFERRING PHYSICIAN: Dr. Dolores Frame PRIMARY CARE PHYSICIAN: Dr. Noralee Stain  PRIMARY CARDIOLOGIST: Dr. Gwen Pounds  PRESENTING COMPLAINT: Chest pain associated with nausea, shortness of breath that felt like indigestion.   HISTORY OF PRESENT ILLNESS: [name] [name] is a pleasant 69 year old gentleman with history of coronary disease and myocardial infarction, hyperlipidemia, hypertension, esophageal dysmotility and benign esophageal mass who presents with reports of developing midsternal chest pain while he was watching television. He reports associated nausea, presyncope and shortness of breath, felt like indigestion. He took antacids without relief. He is now chest pain free. He only complains of headache. He was noted to have drop in his blood pressure during the ED evaluation and has now had discontinuation of his nitro paste but still remains chest pain free.   PAST MEDICAL HISTORY:  1. Coronary artery disease and myocardial infarction status post cardiac catheterization in February 2011 with 30% proximal LAD lesion. 100% mid LAD with poor collateral, 100% PDA lesion, 70% obtuse marginal, 30% circumflex, 20% RCA.  2. Hypertension.  3. Hyperlipidemia.  4. Chronic back pain.  5. 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  6. Ischemic cardiomyopathy, echo from March 2011 revealing for ejection fraction of 35% to 45% without significant valvular abnormalities.  7. Migraines.  8. Gastroesophageal reflux disease.  9. Esophageal dysmotility and bowel gastritis, last EGD December 2011.  10. Hepatic steatosis.    PAST SURGICAL HISTORY:  1. Bilateral cataract surgery.  2. Low back surgery x3.  3. Neck surgery x2.  4. Cholecystectomy.   ALLERGIES: Parafon forte.   MEDICATIONS:  1. Acetaminophen hydrocodone 5/325, 1 tablet  every six hours as needed.  2. Aspirin 81 mg daily.  3. Fish oil 1000 mg daily.  4. Triamterene 50 mg daily.  5. Simvastatin 20 mg at bedtime.  6. Flexeril 10 mg at bedtime.  7. Omeprazole 40 mg daily.  8. Naproxen 500 mg b.i.d.  9. Multivitamin daily.  10. Bentyl 20 mg q.i.d.  11. Fioricet 50/325/40, 1 to 2 tablets every six hours as needed.   SOCIAL HISTORY: He lives in [place] with his wife. He smokes 1/2 pack per day and occasional alcohol use. No drug use.   FAMILY HISTORY: Sister with diabetes.    REVIEW OF SYSTEMS: CONSTITUTIONAL: No fevers. He endorses nausea as per history of present illness. EYES: No glaucoma. Has history of cataracts. ENT: He wears hearing aid in the left ear. No dysphagia. CARDIOVASCULAR: As per history of present illness. RESPIRATORY: He endorses shortness of breath with chest pain as per history of present illness. GASTROINTESTINAL: Endorses nausea but no vomiting. He has history of gastroesophageal reflux disease and esophageal dysmotility. GENITOURINARY: No dysuria, hematuria. ENDO: No polyuria or polydipsia. HEMATOLOGIC: No easy bleeding. SKIN: No ulcers. MUSCULOSKELETAL: He has chronic back pain. NEUROLOGIC: No history of strokes or seizures. PSYCH: Denies any depression or suicidal ideation.    PHYSICAL EXAMINATION:  VITAL SIGNS: Temperature 97.6, pulse 84, respiratory rate 22, blood pressure 140/73 with drop in his blood pressure 83 to 63, repeat pressure 102/51 after discontinuation of the nitro paste, sating at 99% on room air.   GENERAL: Lying in bed in no apparent distress.   HEENT: Normocephalic, atraumatic. Pupils equal and symmetric, nonicteric. Nares without discharge. Moist mucous membrane.   NECK: Soft and supple. No adenopathy or JVP.   CARDIOVASCULAR: Non-tachy. No murmurs,  rubs, or gallops.   LUNGS: Clear to auscultation bilaterally. No use of accessory muscles or increased respiratory effort.   ABDOMEN: Soft. Positive bowel sounds.  No mass appreciated.   EXTREMITIES: Trace edema. Dorsalis pedis pulses intact.   MUSCULOSKELETAL: No joint effusion.   SKIN: No ulcers.   NEUROLOGIC: No dysarthria or aphasia. Symmetrical strength. No focal deficits.   PSYCH: He is alert and oriented. Patient is cooperative.   LABORATORY, DIAGNOSTIC AND RADIOLOGICAL DATA: WBC 8.5, hemoglobin 13.7, hematocrit 40.3, platelet 229, MCV 97, glucose 125, BUN 24, creatinine 0.83, sodium 145, potassium 4.2, chloride 106, carbon dioxide 30, calcium 9.1. Troponin less than 0.02. Chest x-ray without acute findings. EKG with sinus rate of 89. No ST changes.   ASSESSMENT AND PLAN: [name] [name] is a pleasant 69 year old gentleman with history of coronary artery disease and myocardial infarction, hypertension, hyperlipidemia, ischemic cardiomyopathy, esophageal dysmotility, gastroesophageal reflux disease presenting with midsternal chest pain, nausea, shortness of breath similar to his past myocardial infarction.  1. Unstable angina status post aspirin, nitroglycerin and Lovenox in the ED, currently chest pain free. Blood pressure dropped likely in the setting of the nitro paste. Will discontinue and continue nitroglycerin sublingual as needed. Start on aspirin, simvastatin. Continue oxygen. Hold off on metoprolol with relative hypotension. Last catheterization in February 2011 with results as dictated above. Will obtain cardiology consultation for diagnostic recommendations. Continue on tele. Cycle cardiac enzymes. Send labs for risk stratification with TSH, fasting lipid panel, and A1c.  2. Relative hypotension. His nitro paste has been discontinued. Hold his triamterene for now.  3. Ischemic cardiomyopathy. Patient is euvolemic.  4. Chronic back pain. Restart Percocet as needed, Flexeril and naproxen.  5. Prophylaxis with aspirin. He has received Lovenox and omeprazole.   TIME SPENT: Approximately 45 minutes spent on patient care.    ____________________________ Reuel Derby, MD ap:cms D: 09/02/2011 04:43:38 ET T: 09/02/2011 07:44:11 ET JOB#: [id number]  cc: Jonny Longino, MD, <Dictator> Ginette A. Andrey Spearman, MD Reuel Derby MD ELECTRONICALLY SIGNED 09/19/2011 1:58

## 2014-09-25 NOTE — Discharge Summary (Signed)
PATIENT NAME:  [name] [name], [name] [name] MR#:  [id number] DATE OF BIRTH:  July 23, 1945  DATE OF ADMISSION:  09/02/2011 DATE OF DISCHARGE:  09/02/2011  For a detailed note, please take a look at the history and physical done by Dr. Margie Ege on admission.  DIAGNOSES AT DISCHARGE: 1. Chest pain, likely musculoskeletal in nature. 2. Ischemic cardiomyopathy, ejection fraction of 35%. 3. History of coronary disease. 4. Obesity. 5. Chronic back pain. 6. Hyperlipidemia.   DIET: The patient is being discharged on a low sodium, low fat diet.   ACTIVITY: As tolerated.   FOLLOW-UP: Follow-up with his primary care physician, Dr. Brooke Dare, at [facility] on April 5th at 11 a.m.   DISCHARGE MEDICATIONS:  1. Percocet 5/325 one tab q.6 hours p.r.n.  2. Flexeril 10 mg at bedtime. 3. Simvastatin 20 mg at bedtime.  4. Omeprazole 40 mg daily.  5. Bentyl 10 mg b.i.d.  6. Fioricet 1 tab q.12 hours as needed.  7. Fish Oil daily.  8. Multivitamin daily.  9. Naprosyn 500 mg daily.  10. Triamterene 50 mg daily.  11. Aspirin 81 mg daily.   CONSULTANT DURING THE HOSPITAL COURSE: Dr. Arnoldo Hooker from Cardiology    PERTINENT STUDIES DONE DURING THE HOSPITAL COURSE:  1. Chest x-ray done on admission showing findings suggestive of CHF. No focal pneumonia.  2. Nuclear Medicine stress test done showing negative Lexiscan infusion, LV function normal, 50 to 55% with no wall motion abnormalities, fixed apical hypoperfusion with stress and rest with no reversibility.   HOSPITAL COURSE: This is a 69 year old male with medical problems as mentioned above who presented to the hospital secondary to chest pain.  1. Chest pain. Given the patient's history of previous coronary artery disease, ischemic cardiomyopathy, the patient was observed overnight on telemetry, had three sets of cardiac markers done which were negative. He was maintained on aspirin, statin, oxygen, and some p.r.n. nitroglycerin. He underwent a  stress test day of admission which showed no evidence of any wall motion abnormalities and normal LV function. He currently is chest pain free and hemodynamically stable and is, therefore, being discharged home.  2. Hypertension. The patient had some relative hypotension while he was here. His triamterene was held. His blood pressure since then has improved. He can resume his triamterene upon discharge.  3. Ischemic cardiomyopathy. He had no evidence of congestive heart failure clinically even though the chest x-ray did show that. He was maintained on his aspirin, statin, oxygen, and some sublingual nitroglycerin. He will resume his maintenance meds as mentioned along with his triamterene as mentioned.  4. Chronic back pain. The patient was maintained on his Percocet, Flexeril, and Naprosyn and he will resume that upon discharge.   CODE STATUS: The patient is a FULL CODE.   TIME SPENT: 35 minutes.  ____________________________ Rolly Pancake. Cherlynn Kaiser, MD vjs:drc D: 09/02/2011 15:35:58 ET T: 09/03/2011 13:30:59 ET JOB#: [id number]  cc: Rolly Pancake. Cherlynn Kaiser, MD, <Dictator> Dr. Brooke Dare at [facility] [place] Siren MD ELECTRONICALLY SIGNED 09/03/2011 15:00

## 2014-09-25 NOTE — Consult Note (Signed)
PATIENT NAME:  [name] [name] MR#:  [id number] DATE OF BIRTH:  1946/05/15  DATE OF CONSULTATION:  09/02/2011  REFERRING PHYSICIAN:  Alounthith Phichith, MD CONSULTING PHYSICIAN:  Lamar Blinks, MD  CHIEF COMPLAINT:  "I have chest pain."   HISTORY OF PRESENT ILLNESS: This is a 69 year old male with known history of coronary artery disease, myocardial infarction, hypertension, hyperlipidemia, esophageal dysmotility beginning with esophageal mass in the past with mid sternal chest discomfort while watching TV associated with nausea, shortness of breath and indigestion. He took some antacids without relief and did have chest pain relief with nitroglycerin. He previously had coronary artery disease with cardiac catheterization showing multiple coronary artery atherosclerosis. Hypertension and hyperlipidemia have been very well controlled. He also had mild cardiomyopathy with ejection fraction of 35%, currently controlled on current medical regimen.   REVIEW OF SYSTEMS:   The remainder of review of systems is negative for vision change, ringing in the ears, hearing loss, cough, congestion, heartburn, nausea, vomiting, diarrhea, bloody stools, stomach pain, extremity pain, leg weakness, cramping of the buttocks, known blood clots, headaches, blackouts, dizzy spells, nosebleeds, congestion, trouble swallowing, frequent urination, urination at night, muscle weakness, numbness, anxiety, depression, skin lesions, or skin rashes.   PAST MEDICAL HISTORY:  1. Hypertension.  2. Hyperlipidemia.  3. Coronary artery disease.  4. Migraines.  5. Gastroesophageal reflux.   FAMILY HISTORY: Sister has diabetes.   SOCIAL HISTORY: The patient currently smokes 1/2 pack per day and occasionally drinks alcohol.   ALLERGIES: He has no known drug allergies.   CURRENT MEDICATIONS: As listed.   PHYSICAL EXAMINATION:  VITAL SIGNS: Blood pressure 140/73 bilaterally, heart rate 72 upright, reclining, and regular.    GENERAL: He is a well-appearing male in no acute distress.   HEENT: No icterus, thyromegaly, ulcers, hemorrhage, or xanthelasma.   HEART: Regular rate and rhythm with normal S1 and S2 without murmur, gallop, or rub. Point of maximal impulse is normal size and placement. Carotid upstroke normal without bruit. Jugular venous pressure is normal.   LUNGS: Lungs have few basilar crackles with normal respirations.   ABDOMEN: Soft, nontender, without hepatosplenomegaly or masses. Abdominal aorta is normal size without bruit.   EXTREMITIES: 2+ bilateral pulses in dorsal, pedal, radial, and femoral arteries without lower extremity edema, cyanosis, clubbing, or ulcers.   NEUROLOGIC: He is oriented to time, place, and person with normal mood and affect.   ASSESSMENT: A 69 year old male with coronary artery disease, hypertension, hyperlipidemia, esophageal dysmotility having chest pain without evidence of myocardial infarction needing further treatment options.   RECOMMENDATIONS:  1. Serial ECG and enzymes to assess for possible myocardial infarction.  2. Continue hypertension control with current medical regimen. 3. Treadmill stress test to assess for myocardial ischemia, further need in treatment options. 4. Further lipid management with lipids, statins if necessary, watching for liver enzyme abnormalities.  5. Further treatment options after above. ____________________________ Lamar Blinks, MD bjk:ap D: 09/03/2011 08:01:05 ET             T: 09/03/2011 11:48:45 ET                  JOB#: [id number] cc: Lamar Blinks, MD, <Dictator> Lamar Blinks MD ELECTRONICALLY SIGNED 09/04/2011 16:12

## 2014-09-28 DIAGNOSIS — G629 Polyneuropathy, unspecified: Secondary | ICD-10-CM | POA: Insufficient documentation

## 2014-09-28 DIAGNOSIS — G5600 Carpal tunnel syndrome, unspecified upper limb: Secondary | ICD-10-CM | POA: Insufficient documentation

## 2014-09-28 DIAGNOSIS — G5602 Carpal tunnel syndrome, left upper limb: Secondary | ICD-10-CM | POA: Diagnosis not present

## 2014-09-28 DIAGNOSIS — Z9889 Other specified postprocedural states: Secondary | ICD-10-CM | POA: Diagnosis not present

## 2014-10-03 DIAGNOSIS — Z9889 Other specified postprocedural states: Secondary | ICD-10-CM | POA: Insufficient documentation

## 2014-10-12 DIAGNOSIS — G5602 Carpal tunnel syndrome, left upper limb: Secondary | ICD-10-CM | POA: Diagnosis not present

## 2014-10-17 ENCOUNTER — Encounter
Admission: RE | Admit: 2014-10-17 | Discharge: 2014-10-17 | Disposition: A | Discharge status: Home / Self Care | Payer: 59 | Source: Ambulatory Visit | Attending: Orthopedic Surgery | Admitting: Orthopedic Surgery

## 2014-10-17 DIAGNOSIS — I10 Essential (primary) hypertension: Secondary | ICD-10-CM | POA: Diagnosis not present

## 2014-10-17 DIAGNOSIS — I252 Old myocardial infarction: Secondary | ICD-10-CM | POA: Diagnosis not present

## 2014-10-17 DIAGNOSIS — I509 Heart failure, unspecified: Secondary | ICD-10-CM | POA: Diagnosis not present

## 2014-10-17 DIAGNOSIS — H9193 Unspecified hearing loss, bilateral: Secondary | ICD-10-CM | POA: Diagnosis not present

## 2014-10-17 DIAGNOSIS — Z79899 Other long term (current) drug therapy: Secondary | ICD-10-CM | POA: Diagnosis not present

## 2014-10-17 DIAGNOSIS — Z951 Presence of aortocoronary bypass graft: Secondary | ICD-10-CM | POA: Diagnosis not present

## 2014-10-17 DIAGNOSIS — M1990 Unspecified osteoarthritis, unspecified site: Secondary | ICD-10-CM | POA: Diagnosis not present

## 2014-10-17 DIAGNOSIS — E785 Hyperlipidemia, unspecified: Secondary | ICD-10-CM | POA: Diagnosis not present

## 2014-10-17 DIAGNOSIS — I2510 Atherosclerotic heart disease of native coronary artery without angina pectoris: Secondary | ICD-10-CM | POA: Diagnosis not present

## 2014-10-17 DIAGNOSIS — G5602 Carpal tunnel syndrome, left upper limb: Secondary | ICD-10-CM | POA: Diagnosis not present

## 2014-10-17 DIAGNOSIS — Z981 Arthrodesis status: Secondary | ICD-10-CM | POA: Diagnosis not present

## 2014-10-17 DIAGNOSIS — K219 Gastro-esophageal reflux disease without esophagitis: Secondary | ICD-10-CM | POA: Diagnosis not present

## 2014-10-17 DIAGNOSIS — Z79891 Long term (current) use of opiate analgesic: Secondary | ICD-10-CM | POA: Diagnosis not present

## 2014-10-17 HISTORY — DX: Heart failure, unspecified: I50.9

## 2014-10-17 HISTORY — DX: Dorsalgia, unspecified: M54.9

## 2014-10-17 HISTORY — DX: Atherosclerotic heart disease of native coronary artery without angina pectoris: I25.10

## 2014-10-17 HISTORY — DX: Angina pectoris, unspecified: I20.9

## 2014-10-17 LAB — BASIC METABOLIC PANEL
Anion gap: 6 (ref 5–15)
BUN: 21 mg/dL — ABNORMAL HIGH (ref 6–20)
CALCIUM: 9.3 mg/dL (ref 8.9–10.3)
CO2: 28 mmol/L (ref 22–32)
Chloride: 104 mmol/L (ref 101–111)
Creatinine, Ser: 0.7 mg/dL (ref 0.61–1.24)
GFR calc non Af Amer: >60 mL/min (ref >60)
Glucose, Bld: 106 mg/dL — ABNORMAL HIGH (ref 65–99)
Potassium: 4.6 mmol/L (ref 3.5–5.1)
SODIUM: 138 mmol/L (ref 135–145)

## 2014-10-17 LAB — CBC
HEMATOCRIT: 43.1 % (ref 40.0–52.0)
Hemoglobin: 14.7 g/dL (ref 13.0–18.0)
MCH: 32 pg (ref 26.0–34.0)
MCHC: 34 g/dL (ref 32.0–36.0)
MCV: 94 fL (ref 80.0–100.0)
PLATELETS: 216 10*3/uL (ref 150–440)
RBC: 4.58 MIL/uL (ref 4.40–5.90)
RDW: 13.5 % (ref 11.5–14.5)
WBC: 8.8 10*3/uL (ref 3.8–10.6)

## 2014-10-17 NOTE — Patient Instructions (Signed)
  Your procedure is scheduled on: Oct 20, 2014 Report to Same Day Surgery. To find out your arrival time please call [phone] between 1PM - 3PM on Oct 19, 2014.  Remember: Instructions that are not followed completely may result in serious medical risk, up to and including death, or upon the discretion of your surgeon and anesthesiologist your surgery may need to be rescheduled.    __X__ 1. Do not eat food or drink liquids after midnight. No gum chewing or hard candies.     __x_ 2. No Alcohol for 24 hours before or after surgery.   ____ 3. Bring all medications with you on the day of surgery if instructed.    __x__ 4. Notify your doctor if there is any change in your medical condition     (cold, fever, infections).     Do not wear jewelry, make-up, hairpins, clips or nail polish.  Do not wear lotions, powders, or perfumes. You may wear deodorant.  Do not shave 48 hours prior to surgery. Men may shave face and neck.  Do not bring valuables to the hospital.    [facility] is not responsible for any belongings or valuables.               Contacts, dentures or bridgework may not be worn into surgery.  Leave your suitcase in the car. After surgery it may be brought to your room.  For patients admitted to the hospital, discharge time is determined by your treatment team.   Patients discharged the day of surgery will not be allowed to drive home.   Please read over the following fact sheets that you were given:   [facility] Preparing for surgery   __x__ Take these medicines the morning of surgery with A SIP OF WATER:    1. Gabapentin     ____ Fleet Enema (as directed)   __x__ Use CHG Soap as directed  ____ Use inhalers on the day of surgery  ____ Stop metformin 2 days prior to surgery    ____ Take 1/2 of usual insulin dose the night before surgery and none on the morning of surgery.   ____ Stop Coumadin/Plavix/aspirin on Oct 14, 2014  ____ Stop Anti-inflammatories on  Oct 17, 2014.   ____ Stop supplements until after surgery.    ____ Bring C-Pap to the hospital.

## 2014-10-20 ENCOUNTER — Ambulatory Visit
Admission: RE | Admit: 2014-10-20 | Discharge: 2014-10-20 | Disposition: A | Discharge status: Home / Self Care | Payer: 59 | Source: Ambulatory Visit | Attending: Orthopedic Surgery | Admitting: Orthopedic Surgery

## 2014-10-20 ENCOUNTER — Encounter
Admission: RE | Disposition: A | Discharge status: Home / Self Care | Payer: Self-pay | Source: Ambulatory Visit | Attending: Orthopedic Surgery

## 2014-10-20 ENCOUNTER — Ambulatory Visit: Payer: 59 | Admitting: Anesthesiology

## 2014-10-20 ENCOUNTER — Encounter: Payer: Self-pay | Admitting: *Deleted

## 2014-10-20 DIAGNOSIS — Z79899 Other long term (current) drug therapy: Secondary | ICD-10-CM | POA: Insufficient documentation

## 2014-10-20 DIAGNOSIS — M1990 Unspecified osteoarthritis, unspecified site: Secondary | ICD-10-CM | POA: Diagnosis not present

## 2014-10-20 DIAGNOSIS — I252 Old myocardial infarction: Secondary | ICD-10-CM | POA: Insufficient documentation

## 2014-10-20 DIAGNOSIS — Z79891 Long term (current) use of opiate analgesic: Secondary | ICD-10-CM | POA: Insufficient documentation

## 2014-10-20 DIAGNOSIS — Z981 Arthrodesis status: Secondary | ICD-10-CM | POA: Insufficient documentation

## 2014-10-20 DIAGNOSIS — H9193 Unspecified hearing loss, bilateral: Secondary | ICD-10-CM | POA: Insufficient documentation

## 2014-10-20 DIAGNOSIS — K219 Gastro-esophageal reflux disease without esophagitis: Secondary | ICD-10-CM | POA: Diagnosis not present

## 2014-10-20 DIAGNOSIS — E785 Hyperlipidemia, unspecified: Secondary | ICD-10-CM | POA: Diagnosis not present

## 2014-10-20 DIAGNOSIS — I509 Heart failure, unspecified: Secondary | ICD-10-CM | POA: Diagnosis not present

## 2014-10-20 DIAGNOSIS — I25810 Atherosclerosis of coronary artery bypass graft(s) without angina pectoris: Secondary | ICD-10-CM | POA: Diagnosis not present

## 2014-10-20 DIAGNOSIS — G5602 Carpal tunnel syndrome, left upper limb: Secondary | ICD-10-CM | POA: Diagnosis not present

## 2014-10-20 DIAGNOSIS — I10 Essential (primary) hypertension: Secondary | ICD-10-CM | POA: Diagnosis not present

## 2014-10-20 DIAGNOSIS — I2510 Atherosclerotic heart disease of native coronary artery without angina pectoris: Secondary | ICD-10-CM | POA: Insufficient documentation

## 2014-10-20 DIAGNOSIS — Z951 Presence of aortocoronary bypass graft: Secondary | ICD-10-CM | POA: Diagnosis not present

## 2014-10-20 HISTORY — PX: CARPAL TUNNEL RELEASE: SHX101

## 2014-10-20 SURGERY — CARPAL TUNNEL RELEASE
Anesthesia: General | Laterality: Left | Wound class: Clean

## 2014-10-20 MED ORDER — FENTANYL CITRATE (PF) 100 MCG/2ML IJ SOLN: 25.0000 ug | INTRAMUSCULAR | Status: DC | PRN

## 2014-10-20 MED ORDER — LIDOCAINE HCL (CARDIAC) 20 MG/ML IV SOLN
INTRAVENOUS | Status: DC | PRN
  Administered 2014-10-20: 50 mg via INTRAVENOUS

## 2014-10-20 MED ORDER — PROPOFOL 10 MG/ML IV BOLUS
INTRAVENOUS | Status: DC | PRN
  Administered 2014-10-20: 150 mg via INTRAVENOUS
  Administered 2014-10-20: 20 mg via INTRAVENOUS

## 2014-10-20 MED ORDER — LACTATED RINGERS IV SOLN
INTRAVENOUS | Status: DC
  Administered 2014-10-20 (×2): via INTRAVENOUS

## 2014-10-20 MED ORDER — BUPIVACAINE HCL 0.5 % IJ SOLN
INTRAMUSCULAR | Status: DC | PRN
  Administered 2014-10-20: 10 mL

## 2014-10-20 MED ORDER — FENTANYL CITRATE (PF) 100 MCG/2ML IJ SOLN
INTRAMUSCULAR | Status: DC | PRN
  Administered 2014-10-20 (×2): 50 ug via INTRAVENOUS

## 2014-10-20 MED ORDER — LIDOCAINE HCL (PF) 1 % IJ SOLN
INTRAMUSCULAR | Status: AC
  Filled 2014-10-20: qty 30

## 2014-10-20 MED ORDER — HYDROCODONE-ACETAMINOPHEN 5-325 MG PO TABS: 1.0000 | ORAL_TABLET | Freq: Four times a day (QID) | ORAL | Status: DC | PRN

## 2014-10-20 MED ORDER — HYDROCODONE-ACETAMINOPHEN 5-325 MG PO TABS
ORAL_TABLET | ORAL | Status: AC
  Filled 2014-10-20: qty 1

## 2014-10-20 MED ORDER — ONDANSETRON HCL 4 MG/2ML IJ SOLN
INTRAMUSCULAR | Status: DC | PRN
  Administered 2014-10-20: 4 mg via INTRAVENOUS

## 2014-10-20 MED ORDER — GLYCOPYRROLATE 0.2 MG/ML IJ SOLN
INTRAMUSCULAR | Status: DC | PRN
  Administered 2014-10-20: 0.2 mg via INTRAVENOUS

## 2014-10-20 MED ORDER — HYDROCODONE-ACETAMINOPHEN 5-325 MG PO TABS
1.0000 | ORAL_TABLET | Freq: Four times a day (QID) | ORAL | Status: DC | PRN
  Administered 2014-10-20: 1 via ORAL

## 2014-10-20 MED ORDER — MIDAZOLAM HCL 2 MG/2ML IJ SOLN
INTRAMUSCULAR | Status: DC | PRN
  Administered 2014-10-20: 2 mg via INTRAVENOUS

## 2014-10-20 MED ORDER — BUPIVACAINE HCL (PF) 0.5 % IJ SOLN
INTRAMUSCULAR | Status: AC
  Filled 2014-10-20: qty 30

## 2014-10-20 MED ORDER — ONDANSETRON HCL 4 MG/2ML IJ SOLN
4.0000 mg | Freq: Once | INTRAMUSCULAR | Status: DC | PRN
Start: 2014-10-20 — End: 2014-10-20

## 2014-10-20 SURGICAL SUPPLY — 25 items
BANDAGE ELASTIC 3 CLIP ST LF (GAUZE/BANDAGES/DRESSINGS) ×3 IMPLANT
BNDG ESMARK 4X12 TAN STRL LF (GAUZE/BANDAGES/DRESSINGS) ×3 IMPLANT
CANISTER SUCT 1200ML W/VALVE (MISCELLANEOUS) ×3 IMPLANT
CHLORAPREP W/TINT 26ML (MISCELLANEOUS) ×3 IMPLANT
ELECT CAUTERY NEEDLE 2.0 MIC (NEEDLE) ×3 IMPLANT
ELECT CAUTERY NEEDLE TIP 1.0 (MISCELLANEOUS) ×3
ELECTRODE CAUTERY NEDL TIP 1.0 (MISCELLANEOUS) ×1 IMPLANT
GAUZE PETRO XEROFOAM 1X8 (MISCELLANEOUS) ×3 IMPLANT
GAUZE SPONGE 4X4 12PLY STRL (GAUZE/BANDAGES/DRESSINGS) ×3 IMPLANT
GLOVE BIOGEL PI IND STRL 9 (GLOVE) ×1 IMPLANT
GLOVE BIOGEL PI INDICATOR 9 (GLOVE) ×2
GLOVE SURG ORTHO 9.0 STRL STRW (GLOVE) ×3 IMPLANT
GOWN SPECIALTY ULTRA XL (MISCELLANEOUS) ×3 IMPLANT
GOWN STRL REUS W/ TWL LRG LVL3 (GOWN DISPOSABLE) ×1 IMPLANT
GOWN STRL REUS W/TWL 2XL LVL3 (GOWN DISPOSABLE) ×3 IMPLANT
GOWN STRL REUS W/TWL LRG LVL3 (GOWN DISPOSABLE) ×2
KIT RM TURNOVER STRD PROC AR (KITS) ×3 IMPLANT
NS IRRIG 500ML POUR BTL (IV SOLUTION) ×3 IMPLANT
PACK EXTREMITY ARMC (MISCELLANEOUS) ×3 IMPLANT
PAD CAST CTTN 4X4 STRL (SOFTGOODS) ×1 IMPLANT
PADDING CAST COTTON 4X4 STRL (SOFTGOODS) ×2
STOCKINETTE STRL 4IN 9604848 (GAUZE/BANDAGES/DRESSINGS) ×3 IMPLANT
SUT ETHILON 4-0 (SUTURE) ×2
SUT ETHILON 4-0 FS2 18XMFL BLK (SUTURE) ×1
SUTURE ETHLN 4-0 FS2 18XMF BLK (SUTURE) ×1 IMPLANT

## 2014-10-20 NOTE — Discharge Instructions (Signed)
Work on range of motion of fingers on right hand. Loosen Ace wrap if hand swells.

## 2014-10-20 NOTE — Anesthesia Preprocedure Evaluation (Signed)
Anesthesia Evaluation  Patient identified by MRN, date of birth, ID band Patient awake    Reviewed: Allergy & Precautions, NPO status , Patient's Chart, lab work & pertinent test results  Airway Mallampati: III  TM Distance: >3 FB Neck ROM: Full    Dental  (+) Upper Dentures, Lower Dentures   Pulmonary former smoker (quit x 1 yr),          Cardiovascular - angina+ CAD, + CABG and +CHF (s/p CABG)     Neuro/Psych    GI/Hepatic GERD-  Medicated and Controlled,  Endo/Other    Renal/GU      Musculoskeletal   Abdominal   Peds  Hematology   Anesthesia Other Findings   Reproductive/Obstetrics                             Anesthesia Physical Anesthesia Plan  ASA: III  Anesthesia Plan: General   Post-op Pain Management:    Induction: Intravenous  Airway Management Planned: LMA  Additional Equipment:   Intra-op Plan:   Post-operative Plan:   Informed Consent: I have reviewed the patients History and Physical, chart, labs and discussed the procedure including the risks, benefits and alternatives for the proposed anesthesia with the patient or authorized representative who has indicated his/her understanding and acceptance.     Plan Discussed with:   Anesthesia Plan Comments:         Anesthesia Quick Evaluation

## 2014-10-20 NOTE — Anesthesia Postprocedure Evaluation (Signed)
  Anesthesia Post-op Note  Patient: [name] [name]  Procedure(s) Performed: Procedure(s): CARPAL TUNNEL RELEASE (Left)  Anesthesia type:General  Patient location: PACU  Post pain: Pain level controlled  Post assessment: Post-op Vital signs reviewed, Patient's Cardiovascular Status Stable, Respiratory Function Stable, Patent Airway and No signs of Nausea or vomiting  Post vital signs: Reviewed and stable  Last Vitals:  Filed Vitals:   10/20/14 1307  BP: 112/69  Pulse: 76  Temp: 36.1 C  Resp: 18    Level of consciousness: awake, alert  and patient cooperative  Complications: No apparent anesthesia complications

## 2014-10-20 NOTE — Anesthesia Procedure Notes (Addendum)
Procedure Name: LMA Insertion Date/Time: 10/20/2014 11:21 AM Performed by: Mathews Argyle Pre-anesthesia Checklist: Patient identified, Emergency Drugs available, Suction available, Patient being monitored and Timeout performed Patient Re-evaluated:Patient Re-evaluated prior to inductionOxygen Delivery Method: Circle system utilized Preoxygenation: Pre-oxygenation with 100% oxygen Intubation Type: IV induction LMA: LMA inserted LMA Size: 4.0 Number of attempts: 1 Tube secured with: Tape Dental Injury: Teeth and Oropharynx as per pre-operative assessment

## 2014-10-20 NOTE — H&P (Signed)
Reviewed paper H+P, will be scanned into chart. No changes noted.  

## 2014-10-20 NOTE — Op Note (Signed)
10/20/2014  12:09 PM  PATIENT:  [name] [name]  69 y.o. male  PRE-OPERATIVE DIAGNOSIS:  LEFT CARPAL TUNNEL SYNDROME  POST-OPERATIVE DIAGNOSIS:  LEFT CARPAL TUNNEL SYNDROME  PROCEDURE:  Procedure(s): CARPAL TUNNEL RELEASE (Left)  SURGEON: Leitha Schuller, MD  ASSISTANTS: None  ANESTHESIA:   general  EBL:  Total I/O In: 600 [I.V.:600] Out: -   BLOOD ADMINISTERED:none  DRAINS: none   LOCAL MEDICATIONS USED:  MARCAINE     SPECIMEN:  No Specimen  DISPOSITION OF SPECIMEN:  N/A  COUNTS:  YES  TOURNIQUET:   18 minutes at 250 mmHg  IMPLANTS: None  DICTATION: .Dragon Dictation patient brought Regular and after adequate general anesthesia was obtained, the left arm was prepped and draped in a sterile fashion. After appropriate patient identification and timeout procedures were completed, tourniquet was raised to250 mmHg. Volar incision was made extending the prior incision proximally and distally across the wrist flexion crease obliquely and there is a dense scar tissue present right under the skin. Distally the carpal tunnel was opened and there was fatty tissue just distal to the opening consistent with lack of compression. A vascular hemostat was placed underneath the transverse carpal ligament scar tissue was released to the proximal wrist flexion crease. The tendon and the nerve and median nerve was noted to have some scar tissue overlying it and this was released as well with some small hourglass constriction in the distal portion of the prior release. There is mild flexor tenosynovitis. The there appeared to be adequate release of the nerve at this point and wound was infiltrated with 10 cc of 5% Sensorcaine plain postoperative analgesia. The wound was closed with simple 4-0 nylon skin closure Xeroform 4 x 4's web roll and Ace wrap tourniquet is let down and patient sent to recovery room  PLAN OF CARE: Discharge to home after PACU  PATIENT DISPOSITION:  PACU - hemodynamically  stable.

## 2014-10-20 NOTE — Transfer of Care (Signed)
Immediate Anesthesia Transfer of Care Note  Patient: [name] [name]  Procedure(s) Performed: Procedure(s): CARPAL TUNNEL RELEASE (Left)  Patient Location: PACU  Anesthesia Type:General  Level of Consciousness: sedated and responds to stimulation  Airway & Oxygen Therapy: Patient Spontanous Breathing and Patient connected to face mask oxygen  Post-op Assessment: Report given to RN and Post -op Vital signs reviewed and stable  Post vital signs: Reviewed and stable  Last Vitals:  Filed Vitals:   10/20/14 1201  BP: 112/72  Pulse: 88  Temp: 36.7 C  Resp: 8    Complications: No apparent anesthesia complications

## 2014-10-21 ENCOUNTER — Encounter: Payer: Self-pay | Admitting: Orthopedic Surgery

## 2014-10-24 DIAGNOSIS — Z9889 Other specified postprocedural states: Secondary | ICD-10-CM | POA: Diagnosis not present

## 2014-11-07 ENCOUNTER — Telehealth: Payer: Self-pay | Admitting: Family Medicine

## 2014-11-07 MED ORDER — DICYCLOMINE HCL 10 MG PO CAPS: 10.0000 mg | ORAL_CAPSULE | Freq: Two times a day (BID) | ORAL | Status: DC

## 2014-11-07 NOTE — Telephone Encounter (Signed)
Patient is requesting a refill on Dicyclomine. Please send to  [email or web address]. He is completely out and he did schedule an appointment for next Monday

## 2014-11-07 NOTE — Telephone Encounter (Signed)
Refilled for 1 month and patient has a follow up with Dr. Sherryll Burger.

## 2014-11-14 ENCOUNTER — Ambulatory Visit (INDEPENDENT_AMBULATORY_CARE_PROVIDER_SITE_OTHER): Payer: Medicare Other | Admitting: Family Medicine

## 2014-11-14 ENCOUNTER — Encounter: Payer: Self-pay | Admitting: Family Medicine

## 2014-11-14 VITALS — BP 110/60 | HR 85 | Resp 15 | Ht 65.0 in | Wt 216.7 lb

## 2014-11-14 DIAGNOSIS — M5416 Radiculopathy, lumbar region: Secondary | ICD-10-CM | POA: Diagnosis not present

## 2014-11-14 DIAGNOSIS — G4701 Insomnia due to medical condition: Secondary | ICD-10-CM | POA: Diagnosis not present

## 2014-11-14 DIAGNOSIS — I2510 Atherosclerotic heart disease of native coronary artery without angina pectoris: Secondary | ICD-10-CM

## 2014-11-14 DIAGNOSIS — E876 Hypokalemia: Secondary | ICD-10-CM | POA: Insufficient documentation

## 2014-11-14 DIAGNOSIS — Z8719 Personal history of other diseases of the digestive system: Secondary | ICD-10-CM | POA: Diagnosis not present

## 2014-11-14 DIAGNOSIS — T502X5A Adverse effect of carbonic-anhydrase inhibitors, benzothiadiazides and other diuretics, initial encounter: Secondary | ICD-10-CM

## 2014-11-14 DIAGNOSIS — G8929 Other chronic pain: Secondary | ICD-10-CM

## 2014-11-14 DIAGNOSIS — T50905A Adverse effect of unspecified drugs, medicaments and biological substances, initial encounter: Secondary | ICD-10-CM

## 2014-11-14 DIAGNOSIS — Z86018 Personal history of other benign neoplasm: Secondary | ICD-10-CM | POA: Insufficient documentation

## 2014-11-14 MED ORDER — OXYCODONE HCL 10 MG PO TABS: 10.0000 mg | ORAL_TABLET | Freq: Two times a day (BID) | ORAL | Status: DC | PRN

## 2014-11-14 MED ORDER — NITROGLYCERIN 0.4 MG SL SUBL: 0.4000 mg | SUBLINGUAL_TABLET | SUBLINGUAL | Status: DC | PRN

## 2014-11-14 MED ORDER — GABAPENTIN 100 MG PO CAPS: 100.0000 mg | ORAL_CAPSULE | Freq: Two times a day (BID) | ORAL | Status: DC

## 2014-11-14 MED ORDER — TEMAZEPAM 7.5 MG PO CAPS: 7.5000 mg | ORAL_CAPSULE | Freq: Every evening | ORAL | Status: DC | PRN

## 2014-11-14 MED ORDER — DICYCLOMINE HCL 10 MG PO CAPS: 10.0000 mg | ORAL_CAPSULE | Freq: Two times a day (BID) | ORAL | Status: DC

## 2014-11-14 MED ORDER — POTASSIUM CHLORIDE CRYS ER 20 MEQ PO TBCR: 20.0000 meq | EXTENDED_RELEASE_TABLET | Freq: Every day | ORAL | Status: DC

## 2014-11-14 NOTE — Progress Notes (Signed)
Name: [name] [name]   MRN: [id number]    DOB: [birth year]   Date:11/14/2014       Progress Note  Subjective  Chief Complaint  Chief Complaint  Patient presents with  . Follow-up  . Pain    Back Pain This is a chronic problem. The problem is unchanged. The pain is present in the lumbar spine and sacro-iliac. The pain radiates to the right thigh. The pain is at a severity of 4/10. The pain is moderate. The symptoms are aggravated by position and sitting. Associated symptoms include tingling. Pertinent negatives include no bladder incontinence, bowel incontinence, chest pain, dysuria or paresthesias. He has tried analgesics for the symptoms.   Insomnia Pt. Is here for follow up of insomnia, presently treated with Temazepam 7.5mg  daily at bedtime as needed. This helps with insomnia, no side effects reported.  Benign Esophageal Tumors:  Pt. 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 Currently on Dicyclomine 10 mg twice daily. Occasionally he experiences difficulty swallowing if he has not taken his medication for a couple of days. No other symptoms reported. He is being followed by Gastroenterology.  Neuropathy: Pt. With history of peripheral neuropathy, most likely attributed to long-standing lower back degenerative disc disease. He experiences numbness, tingling, and burning sensation in his feet and also in his chest since his CABG last year. He is on Gabapentin 100 mg twice daily.    Past Medical History  Diagnosis Date  . Coronary artery disease   . Anginal pain   . Back pain     degenerative disc disease  . CHF (congestive heart failure)     Past Surgical History  Procedure Laterality Date  . Coronary artery bypass graft    . Back surgery N/A     lower back x 3  . Neck surgery N/A     neck x 2, one discectomy and one shaved disc.  Marland Kitchen Carpal tunnel release Left 03/2014    In Dr. Rosita Kea office  . Ankle reconstruction Left 1991  .  Appendectomy    . Carpal tunnel release Left 10/20/2014    Procedure: CARPAL TUNNEL RELEASE;  Surgeon: Kennedy Bucker, MD;  Location: [facility] ORS;  Service: Orthopedics;  Laterality: Left;    History reviewed. No pertinent family history.  History   Social History  . Marital Status: Married    Spouse Name: N/A  . Number of Children: N/A  . Years of Education: N/A   Occupational History  . Not on file.   Social History Main Topics  . Smoking status: Former Smoker    Quit date: 06/18/2013  . Smokeless tobacco: Never Used  . Alcohol Use: Not on file  . Drug Use: No  . Sexual Activity: Not on file   Other Topics Concern  . Not on file   Social History Narrative     Current outpatient prescriptions:  .  aspirin 81 MG tablet, Take 81 mg by mouth every morning., Disp: , Rfl:  .  cyclobenzaprine (FLEXERIL) 10 MG tablet, Take 10 mg by mouth 2 (two) times daily as needed. , Disp: , Rfl:  .  dicyclomine (BENTYL) 10 MG capsule, Take 1 capsule (10 mg total) by mouth 2 (two) times daily., Disp: 60 capsule, Rfl: 0 .  docusate sodium (COLACE) 100 MG capsule, Take 100 mg by mouth at bedtime., Disp: , Rfl:  .  furosemide (LASIX) 20 MG tablet, Take 20 mg by mouth 2 (two) times daily. , Disp: ,  Rfl:  .  gabapentin (NEURONTIN) 100 MG capsule, Take 100 mg by mouth 2 (two) times daily., Disp: , Rfl:  .  Oxycodone HCl 10 MG TABS, Take 10 mg by mouth 4 (four) times daily as needed. , Disp: , Rfl:  .  potassium chloride SA (KLOR-CON M20) 20 MEQ tablet, Take 1 tablet by mouth daily., Disp: , Rfl:  .  temazepam (RESTORIL) 7.5 MG capsule, Take 1 capsule by mouth as needed., Disp: , Rfl:  .  HYDROcodone-acetaminophen (NORCO) 5-325 MG per tablet, Take 1 tablet by mouth every 6 (six) hours as needed for moderate pain. (Patient not taking: Reported on 11/14/2014), Disp: 30 tablet, Rfl: 0 .  nitroGLYCERIN (NITROSTAT) 0.4 MG SL tablet, Place 1 tablet under the tongue as needed., Disp: , Rfl:   Allergies   Allergen Reactions  . Parafon Forte Dsc [Chlorzoxazone] Rash  . Cucumber Extract Other (See Comments)    GI upset     Review of Systems  Cardiovascular: Negative for chest pain and palpitations.  Gastrointestinal: Positive for heartburn. Negative for nausea, vomiting and bowel incontinence.  Genitourinary: Negative for bladder incontinence and dysuria.  Musculoskeletal: Positive for back pain. Negative for myalgias.  Neurological: Positive for tingling. Negative for paresthesias.  Psychiatric/Behavioral: The patient has insomnia.       Objective  Filed Vitals:   11/14/14 1358  BP: 110/60  Pulse: 85  Resp: 15  Height: 5\' 5"  (1.651 m)  Weight: 216 lb 11.2 oz (98.294 kg)  SpO2: 97%    Physical Exam  Constitutional: He is oriented to person, place, and time and well-developed, well-nourished, and in no distress.  HENT:  Head: Normocephalic and atraumatic.  Cardiovascular: Normal rate and regular rhythm.   Pulmonary/Chest: Effort normal and breath sounds normal.  Musculoskeletal:       Right ankle: He exhibits no swelling.       Left ankle: He exhibits no swelling.       Lumbar back: He exhibits decreased range of motion, tenderness, pain and spasm. He exhibits no swelling, no edema and no deformity.  Neurological: He is alert and oriented to person, place, and time.  Nursing note and vitals reviewed.         Assessment & Plan 1. Atherosclerosis of native coronary artery of native heart without angina pectoris  - nitroGLYCERIN (NITROSTAT) 0.4 MG SL tablet; Place 1 tablet (0.4 mg total) under the tongue every 5 (five) minutes as needed for chest pain.  Dispense: 30 tablet; Refill: 0  2. Lumbar radiculopathy Patient has chronic low back pain from degenerative disc disease. It is well controlled on oxycodone 10 mg 2 times daily as needed. Patient is compliant with the controlled substances agreement. Refills for oxycodone provided. Follow-up in one month. -  gabapentin (NEURONTIN) 100 MG capsule; Take 1 capsule (100 mg total) by mouth 2 (two) times daily.  Dispense: 60 capsule; Refill: 5 - Oxycodone HCl 10 MG TABS; Take 1 tablet (10 mg total) by mouth 2 (two) times daily as needed.  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  Dispense: 30 capsule; Refill: 0  4. History of benign esophageal tumor  - dicyclomine (BENTYL) 10 MG capsule; Take 1 capsule (10 mg total) by mouth 2 (two) times daily.  Dispense: 60 capsule; Refill: 5  5. Drug-induced hypokalemia  - potassium chloride SA (KLOR-CON M20) 20  MEQ tablet; Take 1 tablet (20 mEq total) by mouth daily.  Dispense: 30 tablet; Refill: 5 - Comprehensive metabolic panel  Vanissa Strength Asad [facility] 11/14/2014 2:22 PM

## 2014-11-15 LAB — COMPREHENSIVE METABOLIC PANEL
ALBUMIN: 4.6 g/dL (ref 3.6–4.8)
ALK PHOS: 105 IU/L (ref 39–117)
ALT: 57 IU/L — AB (ref 0–44)
AST: 36 IU/L (ref 0–40)
Albumin/Globulin Ratio: 1.8 (ref 1.1–2.5)
BILIRUBIN TOTAL: 1 mg/dL (ref 0.0–1.2)
BUN/Creatinine Ratio: 23 — ABNORMAL HIGH (ref 10–22)
BUN: 18 mg/dL (ref 8–27)
CHLORIDE: 100 mmol/L (ref 97–108)
CO2: 25 mmol/L (ref 18–29)
Calcium: 9.9 mg/dL (ref 8.6–10.2)
Creatinine, Ser: 0.8 mg/dL (ref 0.76–1.27)
GFR calc non Af Amer: 92 mL/min/{1.73_m2} (ref >59)
GFR, EST AFRICAN AMERICAN: 107 mL/min/{1.73_m2} (ref >59)
GLUCOSE: 106 mg/dL — AB (ref 65–99)
Globulin, Total: 2.6 g/dL (ref 1.5–4.5)
POTASSIUM: 4.5 mmol/L (ref 3.5–5.2)
Sodium: 140 mmol/L (ref 134–144)
TOTAL PROTEIN: 7.2 g/dL (ref 6.0–8.5)

## 2014-12-20 ENCOUNTER — Ambulatory Visit: Payer: Medicare Other | Admitting: Family Medicine

## 2014-12-20 ENCOUNTER — Encounter: Payer: Self-pay | Admitting: Family Medicine

## 2014-12-20 ENCOUNTER — Ambulatory Visit (INDEPENDENT_AMBULATORY_CARE_PROVIDER_SITE_OTHER): Payer: Medicare Other | Admitting: Family Medicine

## 2014-12-20 VITALS — BP 138/60 | HR 90 | Temp 98.3°F | Resp 16 | Ht 65.0 in | Wt 216.0 lb

## 2014-12-20 DIAGNOSIS — J0100 Acute maxillary sinusitis, unspecified: Secondary | ICD-10-CM | POA: Diagnosis not present

## 2014-12-20 MED ORDER — AZITHROMYCIN 250 MG PO TABS: 250.0000 mg | ORAL_TABLET | Freq: Every day | ORAL | Status: DC

## 2014-12-20 NOTE — Progress Notes (Signed)
Name: [name] [name]   MRN: [id number]    DOB: 09-18-1945   Date:12/20/2014       Progress Note  Subjective  Chief Complaint  Chief Complaint  Patient presents with  . Acute Visit    Upper Respiratory Infection    Sinusitis This is a new problem. The current episode started in the past 7 days (started 4 days ago.). There has been no fever. His pain is at a severity of 5/10. The pain is moderate. Associated symptoms include chills, congestion, coughing, diaphoresis, headaches, sinus pressure and a sore throat. Pertinent negatives include no shortness of breath. Past treatments include oral decongestants.      Past Medical History  Diagnosis Date  . Coronary artery disease   . Anginal pain   . Back pain     degenerative disc disease  . CHF (congestive heart failure)     Past Surgical History  Procedure Laterality Date  . Coronary artery bypass graft    . Back surgery N/A     lower back x 3  . Neck surgery N/A     neck x 2, one discectomy and one shaved disc.  Marland Kitchen Carpal tunnel release Left 03/2014    In Dr. Rosita Kea office  . Ankle reconstruction Left 1991  . Appendectomy    . Carpal tunnel release Left 10/20/2014    Procedure: CARPAL TUNNEL RELEASE;  Surgeon: Kennedy Bucker, MD;  Location: [facility] ORS;  Service: Orthopedics;  Laterality: Left;    History reviewed. No pertinent family history.  History   Social History  . Marital Status: Married    Spouse Name: N/A  . Number of Children: N/A  . Years of Education: N/A   Occupational History  . Not on file.   Social History Main Topics  . Smoking status: Former Smoker    Quit date: 06/18/2013  . Smokeless tobacco: Never Used  . Alcohol Use: Not on file  . Drug Use: No  . Sexual Activity: Not on file   Other Topics Concern  . Not on file   Social History Narrative     Current outpatient prescriptions:  .  aspirin 81 MG tablet, Take 81 mg by mouth every morning., Disp: , Rfl:  .  cyclobenzaprine (FLEXERIL) 10  MG tablet, Take 10 mg by mouth 2 (two) times daily as needed. , Disp: , Rfl:  .  dicyclomine (BENTYL) 10 MG capsule, Take 1 capsule (10 mg total) by mouth 2 (two) times daily., Disp: 60 capsule, Rfl: 5 .  docusate sodium (COLACE) 100 MG capsule, Take 100 mg by mouth at bedtime., Disp: , Rfl:  .  furosemide (LASIX) 20 MG tablet, Take 20 mg by mouth 2 (two) times daily. , Disp: , Rfl:  .  gabapentin (NEURONTIN) 100 MG capsule, Take 1 capsule (100 mg total) by mouth 2 (two) times daily., Disp: 60 capsule, Rfl: 5 .  nitroGLYCERIN (NITROSTAT) 0.4 MG SL tablet, Place 1 tablet (0.4 mg total) under the tongue every 5 (five) minutes as needed for chest pain., Disp: 30 tablet, Rfl: 0 .  Oxycodone HCl 10 MG TABS, Take 1 tablet (10 mg total) by mouth 2 (two) times daily as needed., Disp: 60 tablet, Rfl: 0 .  potassium chloride SA (KLOR-CON M20) 20 MEQ tablet, Take 1 tablet (20 mEq total) by mouth daily., Disp: 30 tablet, Rfl: 5 .  temazepam (RESTORIL) 7.5 MG capsule, Take 1 capsule (7.5 mg total) by mouth at bedtime as needed., Disp: 30 capsule, Rfl:  0  Allergies  Allergen Reactions  . Parafon Forte Dsc [Chlorzoxazone] Rash  . Cucumber Extract Other (See Comments)    GI upset     Review of Systems  Constitutional: Positive for chills and diaphoresis. Negative for fever.  HENT: Positive for congestion, sinus pressure and sore throat.   Respiratory: Positive for cough. Negative for shortness of breath.   Neurological: Positive for headaches.      Objective  Filed Vitals:   12/20/14 1443  BP: 138/60  Pulse: 90  Temp: 98.3 F (36.8 C)  TempSrc: Oral  Resp: 16  Height: 5\' 5"  (1.651 m)  Weight: 216 lb (97.977 kg)  SpO2: 96%    Physical Exam  Constitutional: He is well-developed, well-nourished, and in no distress.  HENT:  Head: Normocephalic and atraumatic.  Nose: Right sinus exhibits maxillary sinus tenderness and frontal sinus tenderness. Left sinus exhibits maxillary sinus tenderness  and frontal sinus tenderness.  Mouth/Throat: Posterior oropharyngeal erythema present.  Cardiovascular: Normal rate and regular rhythm.   Pulmonary/Chest: Effort normal and breath sounds normal.  Nursing note and vitals reviewed.    Assessment & Plan 1. Acute maxillary sinusitis, recurrence not specified  - azithromycin (ZITHROMAX Z-PAK) 250 MG tablet; Take 1 tablet (250 mg total) by mouth daily. Take 2 tabs po x day 1, then 1 tab po q day x 4 days.  Dispense: 6 each; Refill: 0   Sharen Youngren Asad [facility] 12/20/2014 3:17 PM

## 2015-02-02 ENCOUNTER — Ambulatory Visit (INDEPENDENT_AMBULATORY_CARE_PROVIDER_SITE_OTHER): Payer: Medicare Other | Admitting: Family Medicine

## 2015-02-03 ENCOUNTER — Ambulatory Visit (INDEPENDENT_AMBULATORY_CARE_PROVIDER_SITE_OTHER): Payer: Medicare Other | Admitting: Family Medicine

## 2015-02-03 ENCOUNTER — Ambulatory Visit: Payer: Medicare Other | Admitting: Family Medicine

## 2015-02-03 ENCOUNTER — Encounter: Payer: Self-pay | Admitting: Family Medicine

## 2015-02-03 VITALS — BP 138/73 | HR 65 | Temp 97.9°F | Resp 18 | Ht 65.0 in | Wt 217.2 lb

## 2015-02-03 DIAGNOSIS — M19049 Primary osteoarthritis, unspecified hand: Secondary | ICD-10-CM | POA: Insufficient documentation

## 2015-02-03 DIAGNOSIS — M7989 Other specified soft tissue disorders: Secondary | ICD-10-CM

## 2015-02-03 DIAGNOSIS — G8929 Other chronic pain: Secondary | ICD-10-CM

## 2015-02-03 DIAGNOSIS — R600 Localized edema: Secondary | ICD-10-CM | POA: Insufficient documentation

## 2015-02-03 DIAGNOSIS — M9979 Connective tissue and disc stenosis of intervertebral foramina of abdomen and other regions: Secondary | ICD-10-CM | POA: Insufficient documentation

## 2015-02-03 DIAGNOSIS — E7800 Pure hypercholesterolemia, unspecified: Secondary | ICD-10-CM | POA: Insufficient documentation

## 2015-02-03 DIAGNOSIS — I255 Ischemic cardiomyopathy: Secondary | ICD-10-CM | POA: Insufficient documentation

## 2015-02-03 DIAGNOSIS — I10 Essential (primary) hypertension: Secondary | ICD-10-CM | POA: Insufficient documentation

## 2015-02-03 DIAGNOSIS — K219 Gastro-esophageal reflux disease without esophagitis: Secondary | ICD-10-CM | POA: Insufficient documentation

## 2015-02-03 DIAGNOSIS — I2510 Atherosclerotic heart disease of native coronary artery without angina pectoris: Secondary | ICD-10-CM | POA: Insufficient documentation

## 2015-02-03 DIAGNOSIS — M549 Dorsalgia, unspecified: Secondary | ICD-10-CM

## 2015-02-03 MED ORDER — CYCLOBENZAPRINE HCL 10 MG PO TABS: 10.0000 mg | ORAL_TABLET | Freq: Two times a day (BID) | ORAL | Status: DC | PRN

## 2015-02-03 MED ORDER — FUROSEMIDE 20 MG PO TABS: 20.0000 mg | ORAL_TABLET | Freq: Two times a day (BID) | ORAL | Status: DC

## 2015-02-03 MED ORDER — OXYCODONE HCL 10 MG PO TABS: 10.0000 mg | ORAL_TABLET | Freq: Two times a day (BID) | ORAL | Status: DC | PRN

## 2015-02-03 NOTE — Progress Notes (Signed)
Name: [name] [name]   MRN: [id number]    DOB: [birth year]   Date:02/03/2015       Progress Note  Subjective  Chief Complaint  Chief Complaint  Patient presents with  . Medication Refill    oxycodone 10 mg / cyclobenzaprine 100mg     Back Pain This is a chronic problem. The problem is unchanged. The pain is present in the lumbar spine. The pain is moderate. The symptoms are aggravated by bending and position. Pertinent negatives include no chest pain or fever. He has tried analgesics and muscle relaxant for the symptoms. The treatment provided significant relief.    Past Medical History  Diagnosis Date  . Coronary artery disease   . Anginal pain   . Back pain     degenerative disc disease  . CHF (congestive heart failure)     Past Surgical History  Procedure Laterality Date  . Coronary artery bypass graft    . Back surgery N/A     lower back x 3  . Neck surgery N/A     neck x 2, one discectomy and one shaved disc.  Marland Kitchen Carpal tunnel release Left 03/2014    In Dr. Rosita Kea office  . Ankle reconstruction Left 1991  . Appendectomy    . Carpal tunnel release Left 10/20/2014    Procedure: CARPAL TUNNEL RELEASE;  Surgeon: Kennedy Bucker, MD;  Location: [facility] ORS;  Service: Orthopedics;  Laterality: Left;    History reviewed. No pertinent family history.  Social History   Social History  . Marital Status: Married    Spouse Name: N/A  . Number of Children: N/A  . Years of Education: N/A   Occupational History  . Not on file.   Social History Main Topics  . Smoking status: Former Smoker    Quit date: 06/18/2013  . Smokeless tobacco: Never Used  . Alcohol Use: Not on file  . Drug Use: No  . Sexual Activity: Not on file   Other Topics Concern  . Not on file   Social History Narrative     Current outpatient prescriptions:  .  aspirin 81 MG tablet, Take 81 mg by mouth every morning., Disp: , Rfl:  .  azithromycin (ZITHROMAX Z-PAK) 250 MG tablet, Take 1 tablet (250 mg  total) by mouth daily. Take 2 tabs po x day 1, then 1 tab po q day x 4 days., Disp: 6 each, Rfl: 0 .  cyclobenzaprine (FLEXERIL) 10 MG tablet, Take 10 mg by mouth 2 (two) times daily as needed. , Disp: , Rfl:  .  dicyclomine (BENTYL) 10 MG capsule, Take 1 capsule (10 mg total) by mouth 2 (two) times daily., Disp: 60 capsule, Rfl: 5 .  docusate sodium (COLACE) 100 MG capsule, Take 100 mg by mouth at bedtime., Disp: , Rfl:  .  furosemide (LASIX) 20 MG tablet, Take 20 mg by mouth 2 (two) times daily. , Disp: , Rfl:  .  gabapentin (NEURONTIN) 100 MG capsule, Take 1 capsule (100 mg total) by mouth 2 (two) times daily., Disp: 60 capsule, Rfl: 5 .  nitroGLYCERIN (NITROSTAT) 0.4 MG SL tablet, Place 1 tablet (0.4 mg total) under the tongue every 5 (five) minutes as needed for chest pain., Disp: 30 tablet, Rfl: 0 .  Oxycodone HCl 10 MG TABS, Take 1 tablet (10 mg total) by mouth 2 (two) times daily as needed., Disp: 60 tablet, Rfl: 0 .  potassium chloride SA (KLOR-CON M20) 20 MEQ tablet, Take 1 tablet (20 mEq  total) by mouth daily., Disp: 30 tablet, Rfl: 5 .  temazepam (RESTORIL) 7.5 MG capsule, Take 1 capsule (7.5 mg total) by mouth at bedtime as needed., Disp: 30 capsule, Rfl: 0  Allergies  Allergen Reactions  . Parafon Forte Dsc [Chlorzoxazone] Rash  . Cucumber Extract Other (See Comments)    GI upset    Review of Systems  Constitutional: Negative for fever and chills.  Cardiovascular: Negative for chest pain and leg swelling.  Musculoskeletal: Positive for back pain.   Objective  Filed Vitals:   02/03/15 0829  BP: 138/73  Pulse: 65  Temp: 97.9 F (36.6 C)  TempSrc: Oral  Resp: 18  Height:  (1.651 m)  Weight: 217 lb 3.2 oz (98.521 kg)  SpO2: 95%    Physical Exam  Constitutional: He is well-developed, well-nourished, and in no distress.  Cardiovascular: Normal rate and regular rhythm.   Pulmonary/Chest: Effort normal and breath sounds normal.  Musculoskeletal:       Lumbar  back: He exhibits tenderness, pain and spasm.  Nursing note and vitals reviewed.   Assessment & Plan  1. Back pain, chronic  Chronic low back pain stable on chronic opioid therapy. Patient compliant with controlled substances agreement and is aware of the adverse effects and the dependence potential of opioids. Refills provided and follow-up in 3 months.  - Oxycodone HCl 10 MG TABS; Take 1 tablet (10 mg total) by mouth 2 (two) times daily as needed.  Dispense: 60 tablet; Refill: 0 - cyclobenzaprine (FLEXERIL) 10 MG tablet; Take 1 tablet (10 mg total) by mouth 2 (two) times daily as needed.  Dispense: 60 tablet; Refill: 0  2. Leg swelling Stable on present diuretic therapy. - furosemide (LASIX) 20 MG tablet; Take 1 tablet (20 mg total) by mouth 2 (two) times daily.  Dispense: 60 tablet; Refill: 2   [name] [name] Asad [facility] 02/03/2015 8:43 AM

## 2015-03-27 ENCOUNTER — Other Ambulatory Visit: Payer: Self-pay | Admitting: Family Medicine

## 2015-03-27 DIAGNOSIS — M549 Dorsalgia, unspecified: Principal | ICD-10-CM

## 2015-03-27 DIAGNOSIS — G8929 Other chronic pain: Secondary | ICD-10-CM

## 2015-03-27 MED ORDER — OXYCODONE HCL 10 MG PO TABS: 10.0000 mg | ORAL_TABLET | Freq: Two times a day (BID) | ORAL | Status: DC | PRN

## 2015-03-27 NOTE — Telephone Encounter (Signed)
Refill request was sent to Dr. Syed Asad Shah for approval and submission.  

## 2015-03-27 NOTE — Telephone Encounter (Signed)
Pt states he needs a refill on oxycodone.

## 2015-04-26 ENCOUNTER — Telehealth: Payer: Self-pay

## 2015-04-26 MED ORDER — OMEPRAZOLE 40 MG PO CPDR: 40.0000 mg | DELAYED_RELEASE_CAPSULE | Freq: Every day | ORAL | Status: DC

## 2015-04-26 NOTE — Telephone Encounter (Signed)
Medication has been refilled and sent to [facility] [address] 

## 2015-04-26 NOTE — Telephone Encounter (Signed)
Error- please disregard

## 2015-05-11 ENCOUNTER — Ambulatory Visit (INDEPENDENT_AMBULATORY_CARE_PROVIDER_SITE_OTHER): Payer: Medicare Other | Admitting: Family Medicine

## 2015-05-11 ENCOUNTER — Encounter: Payer: Self-pay | Admitting: Family Medicine

## 2015-05-11 VITALS — BP 124/62 | HR 54 | Temp 97.7°F | Resp 12 | Wt 214.9 lb

## 2015-05-11 DIAGNOSIS — M5416 Radiculopathy, lumbar region: Secondary | ICD-10-CM

## 2015-05-11 DIAGNOSIS — R0989 Other specified symptoms and signs involving the circulatory and respiratory systems: Secondary | ICD-10-CM | POA: Insufficient documentation

## 2015-05-11 DIAGNOSIS — G6289 Other specified polyneuropathies: Secondary | ICD-10-CM | POA: Diagnosis not present

## 2015-05-11 DIAGNOSIS — M549 Dorsalgia, unspecified: Secondary | ICD-10-CM

## 2015-05-11 DIAGNOSIS — M5440 Lumbago with sciatica, unspecified side: Secondary | ICD-10-CM | POA: Insufficient documentation

## 2015-05-11 DIAGNOSIS — G8929 Other chronic pain: Secondary | ICD-10-CM

## 2015-05-11 DIAGNOSIS — I219 Acute myocardial infarction, unspecified: Secondary | ICD-10-CM | POA: Insufficient documentation

## 2015-05-11 DIAGNOSIS — R740 Nonspecific elevation of levels of transaminase and lactic acid dehydrogenase [LDH]: Secondary | ICD-10-CM

## 2015-05-11 DIAGNOSIS — M25539 Pain in unspecified wrist: Secondary | ICD-10-CM | POA: Insufficient documentation

## 2015-05-11 DIAGNOSIS — K229 Disease of esophagus, unspecified: Secondary | ICD-10-CM | POA: Insufficient documentation

## 2015-05-11 DIAGNOSIS — I429 Cardiomyopathy, unspecified: Secondary | ICD-10-CM | POA: Insufficient documentation

## 2015-05-11 DIAGNOSIS — R7401 Elevation of levels of liver transaminase levels: Secondary | ICD-10-CM | POA: Insufficient documentation

## 2015-05-11 MED ORDER — GABAPENTIN 300 MG PO CAPS: 300.0000 mg | ORAL_CAPSULE | Freq: Two times a day (BID) | ORAL | Status: DC

## 2015-05-11 MED ORDER — OXYCODONE HCL 10 MG PO TABS: 10.0000 mg | ORAL_TABLET | Freq: Two times a day (BID) | ORAL | Status: DC | PRN

## 2015-05-11 NOTE — Progress Notes (Signed)
Name: [name] [name]   MRN: [id number]    DOB: 1946-03-05   Date:05/11/2015       Progress Note  Subjective  Chief Complaint  Chief Complaint  Patient presents with  . Medication Refill    all meds except the Omeprazole    Back Pain This is a chronic problem. The problem occurs daily. The pain is present in the lumbar spine. The quality of the pain is described as burning and shooting (Burning in the toes, recently gotten worse.). The pain radiates to the left foot and right foot (Pt. has peripheral neuropathy.). The symptoms are aggravated by position and standing (At his job, he is on his feet a lot, which makes the pain worse.). Associated symptoms include numbness and paresthesias. Pertinent negatives include no bladder incontinence or bowel incontinence. He has tried NSAIDs and analgesics for the symptoms.    Past Medical History  Diagnosis Date  . Coronary artery disease   . Anginal pain (HCC)   . Back pain     degenerative disc disease  . CHF (congestive heart failure) [facility])     Past Surgical History  Procedure Laterality Date  . Coronary artery bypass graft    . Back surgery N/A     lower back x 3  . Neck surgery N/A     neck x 2, one discectomy and one shaved disc.  Marland Kitchen Carpal tunnel release Left 03/2014    In Dr. Rosita Kea office  . Ankle reconstruction Left 1991  . Appendectomy    . Carpal tunnel release Left 10/20/2014    Procedure: CARPAL TUNNEL RELEASE;  Surgeon: [name] Bucker, MD;  Location: [facility] ORS;  Service: Orthopedics;  Laterality: Left;    History reviewed. No pertinent family history.  Social History   Social History  . Marital Status: Married    Spouse Name: N/A  . Number of Children: N/A  . Years of Education: N/A   Occupational History  . Not on file.   Social History Main Topics  . Smoking status: Former Smoker    Quit date: 06/18/2013  . Smokeless tobacco: Never Used  . Alcohol Use: Not on file  . Drug Use: No  . Sexual Activity: Not on  file   Other Topics Concern  . Not on file   Social History Narrative     Current outpatient prescriptions:  .  aspirin 81 MG tablet, Take 81 mg by mouth every morning., Disp: , Rfl:  .  cyclobenzaprine (FLEXERIL) 10 MG tablet, Take 1 tablet (10 mg total) by mouth 2 (two) times daily as needed., Disp: 60 tablet, Rfl: 0 .  dicyclomine (BENTYL) 10 MG capsule, Take 1 capsule (10 mg total) by mouth 2 (two) times daily., Disp: 60 capsule, Rfl: 5 .  docusate sodium (COLACE) 100 MG capsule, Take 100 mg by mouth at bedtime., Disp: , Rfl:  .  furosemide (LASIX) 20 MG tablet, Take 1 tablet (20 mg total) by mouth 2 (two) times daily., Disp: 60 tablet, Rfl: 2 .  gabapentin (NEURONTIN) 100 MG capsule, Take 1 capsule (100 mg total) by mouth 2 (two) times daily., Disp: 60 capsule, Rfl: 5 .  nitroGLYCERIN (NITROSTAT) 0.4 MG SL tablet, Place 1 tablet (0.4 mg total) under the tongue every 5 (five) minutes as needed for chest pain., Disp: 30 tablet, Rfl: 0 .  omeprazole (PRILOSEC) 40 MG capsule, Take 1 capsule (40 mg total) by mouth daily., Disp: 30 capsule, Rfl: 3 .  Oxycodone HCl 10 MG TABS,  Take 1 tablet (10 mg total) by mouth 2 (two) times daily as needed., Disp: 60 tablet, Rfl: 0 .  potassium chloride SA (KLOR-CON M20) 20 MEQ tablet, Take 1 tablet (20 mEq total) by mouth daily., Disp: 30 tablet, Rfl: 5 .  vitamin E (VITAMIN E) 400 UNIT capsule, Take 400 Units by mouth daily., Disp: , Rfl:   Allergies  Allergen Reactions  . Parafon Forte Dsc [Chlorzoxazone] Rash  . Cucumber Extract Other (See Comments)    GI upset     Review of Systems  Gastrointestinal: Negative for bowel incontinence.  Genitourinary: Negative for bladder incontinence.  Musculoskeletal: Positive for back pain.  Neurological: Positive for numbness and paresthesias.    Objective  Filed Vitals:   05/11/15 0843  BP: 124/62  Pulse: 54  Temp: 97.7 F (36.5 C)  TempSrc: Oral  Resp: 12  Weight: 214 lb 14.4 oz (97.478 kg)   SpO2: 96%    Physical Exam  Constitutional: He is well-developed, well-nourished, and in no distress.  Cardiovascular: Normal rate, regular rhythm and normal heart sounds.   No murmur heard. Pulses:      Dorsalis pedis pulses are 1+ on the right side, and 2+ on the left side.       Posterior tibial pulses are 1+ on the right side, and 2+ on the left side.  Pulmonary/Chest: Effort normal and breath sounds normal. He has no wheezes.  Musculoskeletal:       Lumbar back: He exhibits tenderness, pain and spasm.       Right foot: There is no tenderness, no bony tenderness and no swelling.       Left foot: There is no tenderness, no bony tenderness and no swelling.  Nursing note and vitals reviewed.   Assessment & Plan  1. Back pain, chronic Chronic low back pain, stable on opioid therapy. Patient compliant with controlled substances agreement. Refills provided. - Oxycodone HCl 10 MG TABS; Take 1 tablet (10 mg total) by mouth 2 (two) times daily as needed.  Dispense: 60 tablet; Refill: 0  2. Absent peripheral pulse Decreased peripheral pulses on the right lower extremity, patient with history of CVD, will be referred to vascular for evaluation of PVD - Ambulatory referral to Vascular Surgery  3. Lumbar radiculopathy We'll increase gabapentin to 300 mg twice daily for relief of neuropathic pain. Follow-up in one month. - gabapentin (NEURONTIN) 300 MG capsule; Take 1 capsule (300 mg total) by mouth 2 (two) times daily.  Dispense: 60 capsule; Refill: 0  4. Other polyneuropathy (HCC)  - CBC with Differential - Comprehensive Metabolic Panel (CMET) - B12  [name] Asad [facility] 05/11/2015 9:22 AM

## 2015-05-12 LAB — COMPREHENSIVE METABOLIC PANEL
ALK PHOS: 102 IU/L (ref 39–117)
ALT: 58 IU/L — AB (ref 0–44)
AST: 48 IU/L — AB (ref 0–40)
Albumin/Globulin Ratio: 1.8 (ref 1.1–2.5)
Albumin: 4.4 g/dL (ref 3.6–4.8)
BILIRUBIN TOTAL: 0.9 mg/dL (ref 0.0–1.2)
BUN / CREAT RATIO: 26 — AB (ref 10–22)
BUN: 21 mg/dL (ref 8–27)
CHLORIDE: 99 mmol/L (ref 97–106)
CO2: 26 mmol/L (ref 18–29)
Calcium: 9.6 mg/dL (ref 8.6–10.2)
Creatinine, Ser: 0.8 mg/dL (ref 0.76–1.27)
GFR calc Af Amer: 107 mL/min/{1.73_m2} (ref >59)
GFR calc non Af Amer: 92 mL/min/{1.73_m2} (ref >59)
GLUCOSE: 109 mg/dL — AB (ref 65–99)
Globulin, Total: 2.5 g/dL (ref 1.5–4.5)
Potassium: 5 mmol/L (ref 3.5–5.2)
Sodium: 140 mmol/L (ref 136–144)
Total Protein: 6.9 g/dL (ref 6.0–8.5)

## 2015-05-12 LAB — CBC WITH DIFFERENTIAL/PLATELET
BASOS ABS: 0 10*3/uL (ref 0.0–0.2)
Basos: 1 %
EOS (ABSOLUTE): 0.4 10*3/uL (ref 0.0–0.4)
Eos: 6 %
Hematocrit: 42.4 % (ref 37.5–51.0)
Hemoglobin: 14.9 g/dL (ref 12.6–17.7)
Immature Grans (Abs): 0.1 10*3/uL (ref 0.0–0.1)
Immature Granulocytes: 1 %
LYMPHS ABS: 3.1 10*3/uL (ref 0.7–3.1)
Lymphs: 40 %
MCH: 32.1 pg (ref 26.6–33.0)
MCHC: 35.1 g/dL (ref 31.5–35.7)
MCV: 91 fL (ref 79–97)
Monocytes Absolute: 0.9 10*3/uL (ref 0.1–0.9)
Monocytes: 13 %
NEUTROS ABS: 2.9 10*3/uL (ref 1.4–7.0)
Neutrophils: 39 %
PLATELETS: 232 10*3/uL (ref 150–379)
RBC: 4.64 x10E6/uL (ref 4.14–5.80)
RDW: 13.7 % (ref 12.3–15.4)
WBC: 7.4 10*3/uL (ref 3.4–10.8)

## 2015-05-12 LAB — VITAMIN B12: VITAMIN B 12: 319 pg/mL (ref 211–946)

## 2015-05-23 ENCOUNTER — Encounter: Payer: Self-pay | Admitting: *Deleted

## 2015-05-23 ENCOUNTER — Emergency Department
Admission: EM | Admit: 2015-05-23 | Discharge: 2015-05-23 | Disposition: A | Discharge status: Home / Self Care | Payer: Medicare Other | Attending: Emergency Medicine | Admitting: Emergency Medicine

## 2015-05-23 ENCOUNTER — Emergency Department: Payer: Medicare Other

## 2015-05-23 DIAGNOSIS — M25511 Pain in right shoulder: Secondary | ICD-10-CM

## 2015-05-23 DIAGNOSIS — Y9289 Other specified places as the place of occurrence of the external cause: Secondary | ICD-10-CM | POA: Diagnosis not present

## 2015-05-23 DIAGNOSIS — S4991XA Unspecified injury of right shoulder and upper arm, initial encounter: Secondary | ICD-10-CM | POA: Diagnosis not present

## 2015-05-23 DIAGNOSIS — Z87891 Personal history of nicotine dependence: Secondary | ICD-10-CM | POA: Insufficient documentation

## 2015-05-23 DIAGNOSIS — Y9389 Activity, other specified: Secondary | ICD-10-CM | POA: Diagnosis not present

## 2015-05-23 DIAGNOSIS — Z7982 Long term (current) use of aspirin: Secondary | ICD-10-CM | POA: Insufficient documentation

## 2015-05-23 DIAGNOSIS — Z79899 Other long term (current) drug therapy: Secondary | ICD-10-CM | POA: Insufficient documentation

## 2015-05-23 DIAGNOSIS — W1789XA Other fall from one level to another, initial encounter: Secondary | ICD-10-CM | POA: Insufficient documentation

## 2015-05-23 DIAGNOSIS — Y998 Other external cause status: Secondary | ICD-10-CM | POA: Diagnosis not present

## 2015-05-23 DIAGNOSIS — S2231XA Fracture of one rib, right side, initial encounter for closed fracture: Secondary | ICD-10-CM | POA: Insufficient documentation

## 2015-05-23 DIAGNOSIS — S299XXA Unspecified injury of thorax, initial encounter: Secondary | ICD-10-CM | POA: Diagnosis present

## 2015-05-23 NOTE — ED Provider Notes (Signed)
CSN: [id number]     Arrival date & time 05/23/15  1338 History   First MD Initiated Contact with Patient 05/23/15 1428     Chief Complaint  Patient presents with  . Fall     HPI Comments: 69 year old male presents today complaining of right shoulder pain secondary to fall that occurred last evening. Pt reports that he stepped out of his pickup truck last night and fell backward with his right hand outstretched. Since then he has had right shoulder pain and is having difficulty lifting it above his head. No history of shoulder problems. Takes oxycodone on a chronic basis for his back and neck pain.   The history is provided by the patient.    Past Medical History  Diagnosis Date  . Coronary artery disease   . Anginal pain (HCC)   . Back pain     degenerative disc disease  . CHF (congestive heart failure) [facility])    Past Surgical History  Procedure Laterality Date  . Coronary artery bypass graft    . Back surgery N/A     lower back x 3  . Neck surgery N/A     neck x 2, one discectomy and one shaved disc.  Marland Kitchen Carpal tunnel release Left 03/2014    In Dr. Rosita Kea office  . Ankle reconstruction Left 1991  . Appendectomy    . Carpal tunnel release Left 10/20/2014    Procedure: CARPAL TUNNEL RELEASE;  Surgeon: Kennedy Bucker, MD;  Location: [facility] ORS;  Service: Orthopedics;  Laterality: Left;  . Shoulder open rotator cuff repair      Left   No family history on file. Social History  Substance Use Topics  . Smoking status: Former Smoker    Quit date: 06/18/2013  . Smokeless tobacco: Never Used  . Alcohol Use: 0.0 - 0.6 oz/week    0-1 Cans of beer per week     Comment: occasionally    Review of Systems  Musculoskeletal: Positive for myalgias and arthralgias. Negative for back pain and neck pain.  All other systems reviewed and are negative.     Allergies  Parafon forte dsc and Cucumber extract  Home Medications   Prior to Admission medications   Medication Sig Start Date End  Date Taking? Authorizing Provider  aspirin 81 MG tablet Take 81 mg by mouth every morning.    Historical Provider, MD  cyclobenzaprine (FLEXERIL) 10 MG tablet Take 1 tablet (10 mg total) by mouth 2 (two) times daily as needed. 02/03/15   Ellyn Hack, MD  dicyclomine (BENTYL) 10 MG capsule Take 1 capsule (10 mg total) by mouth 2 (two) times daily. 11/14/14   Ellyn Hack, MD  docusate sodium (COLACE) 100 MG capsule Take 100 mg by mouth at bedtime.    Historical Provider, MD  furosemide (LASIX) 20 MG tablet Take 1 tablet (20 mg total) by mouth 2 (two) times daily. 02/03/15   Ellyn Hack, MD  gabapentin (NEURONTIN) 300 MG capsule Take 1 capsule (300 mg total) by mouth 2 (two) times daily. 05/11/15   Ellyn Hack, MD  nitroGLYCERIN (NITROSTAT) 0.4 MG SL tablet Place 1 tablet (0.4 mg total) under the tongue every 5 (five) minutes as needed for chest pain. 11/14/14   Ellyn Hack, MD  omeprazole (PRILOSEC) 40 MG capsule Take 1 capsule (40 mg total) by mouth daily. 04/26/15   Ellyn Hack, MD  Oxycodone HCl 10 MG TABS  Take 1 tablet (10 mg total) by mouth 2 (two) times daily as needed. 05/11/15   Ellyn Hack, MD  potassium chloride SA (KLOR-CON M20) 20 MEQ tablet Take 1 tablet (20 mEq total) by mouth daily. 11/14/14   Ellyn Hack, MD  vitamin E (VITAMIN E) 400 UNIT capsule Take 400 Units by mouth daily.    Historical Provider, MD   BP 123/64 mmHg  Pulse 79  Temp(Src) 97.6 F (36.4 C) (Oral)  Resp 20  Ht  (1.702 m)  Wt 96.163 kg  BMI 33.20 kg/m2  SpO2 97% Physical Exam  Constitutional: Vital signs are normal. He appears well-developed and well-nourished. He is active.  Non-toxic appearance. He does not have a sickly appearance. He does not appear ill.  HENT:  Head: Normocephalic and atraumatic.  Neck: Normal range of motion. Neck supple.  Cardiovascular: Normal rate, regular rhythm, normal heart sounds and intact distal pulses.   Pulmonary/Chest: Effort normal and  breath sounds normal. No respiratory distress. He has no wheezes. He has no rales. He exhibits tenderness.  Musculoskeletal: He exhibits tenderness.       Right shoulder: He exhibits decreased range of motion and tenderness.  Right shoulder - TTP over [facility] joint and lateral humerus. Limited abduction, no ROM greater than 45 degrees. Pain with flexion and extension.   Neurological: He is alert.  Skin: Skin is warm and dry.  Psychiatric: He has a normal mood and affect. His behavior is normal. Judgment and thought content normal.  Nursing note and vitals reviewed.   ED Course  Procedures (including critical care time) Labs Review Labs Reviewed - No data to display  Imaging Review Dg Shoulder Right  05/23/2015  CLINICAL DATA:  Status post fall exiting a truck last night with a right shoulder injury and pain. Initial encounter. Overall EXAM: RIGHT SHOULDER - 2+ VIEW COMPARISON:  None. FINDINGS: The right shoulder is located. No shoulder fracture is identified. Tiny calcification over the greater tuberosity is compatible with calcific rotator cuff tendinopathy. Mild to moderate acromioclavicular degenerative change is seen. There is a right rib fracture which appears to involve the third rib. No pneumothorax is identified. Imaged right lung appears clear. IMPRESSION: Likely right third rib fracture. No acute abnormality involving the shoulder. Acromioclavicular degenerative change. Electronically Signed   By: Drusilla Kanner M.D.   On: 05/23/2015 15:01   I have personally reviewed and evaluated these images and lab results as part of my medical decision-making.   EKG Interpretation None      MDM  Discussed with patient and he understands results. He will continue taking pain medication already prescribed to him - oxycodone. Encouraged him to do gentle shoulder range of motion exercises. Gave him phone number for orthopedist - Dr. Ernest Pine Also encouarged him to take deep breaths to avoid  developing pneumonia since he has a rib fracture.  Final diagnoses:  Right shoulder pain  Rib fracture, right, closed, initial encounter        Christella Scheuermann, PA-C 05/23/15 1540  Emily Filbert, MD 05/23/15 [phone]

## 2015-05-23 NOTE — ED Notes (Signed)
Patient states he fell backwards when stepping out of his truck last night and braced self with right arm. Patient c/o right shoulder pain.

## 2015-05-25 DIAGNOSIS — M25511 Pain in right shoulder: Secondary | ICD-10-CM | POA: Diagnosis not present

## 2015-05-25 DIAGNOSIS — M6281 Muscle weakness (generalized): Secondary | ICD-10-CM | POA: Diagnosis not present

## 2015-05-30 ENCOUNTER — Other Ambulatory Visit: Payer: Self-pay | Admitting: Orthopedic Surgery

## 2015-05-30 DIAGNOSIS — M25511 Pain in right shoulder: Secondary | ICD-10-CM

## 2015-06-01 ENCOUNTER — Ambulatory Visit
Admission: RE | Admit: 2015-06-01 | Discharge: 2015-06-01 | Disposition: A | Discharge status: Home / Self Care | Payer: 59 | Source: Ambulatory Visit | Attending: Orthopedic Surgery | Admitting: Orthopedic Surgery

## 2015-06-01 DIAGNOSIS — S46111A Strain of muscle, fascia and tendon of long head of biceps, right arm, initial encounter: Secondary | ICD-10-CM | POA: Insufficient documentation

## 2015-06-01 DIAGNOSIS — M75121 Complete rotator cuff tear or rupture of right shoulder, not specified as traumatic: Secondary | ICD-10-CM | POA: Insufficient documentation

## 2015-06-01 DIAGNOSIS — M25511 Pain in right shoulder: Secondary | ICD-10-CM | POA: Diagnosis not present

## 2015-06-01 DIAGNOSIS — X58XXXA Exposure to other specified factors, initial encounter: Secondary | ICD-10-CM | POA: Insufficient documentation

## 2015-06-01 DIAGNOSIS — M6281 Muscle weakness (generalized): Secondary | ICD-10-CM | POA: Diagnosis present

## 2015-06-01 MED ORDER — IOHEXOL 300 MG/ML  SOLN
10.0000 mL | Freq: Once | INTRAMUSCULAR | Status: AC | PRN
  Administered 2015-06-01: 10 mL via INTRA_ARTICULAR

## 2015-06-02 ENCOUNTER — Other Ambulatory Visit: Payer: Medicare Other

## 2015-06-02 ENCOUNTER — Telehealth: Payer: Self-pay | Admitting: Family Medicine

## 2015-06-02 ENCOUNTER — Encounter
Admission: RE | Admit: 2015-06-02 | Discharge: 2015-06-02 | Disposition: A | Discharge status: Home / Self Care | Payer: Medicare Other | Source: Ambulatory Visit | Attending: Surgery | Admitting: Surgery

## 2015-06-02 ENCOUNTER — Ambulatory Visit: Payer: Medicare Other

## 2015-06-02 DIAGNOSIS — Z01812 Encounter for preprocedural laboratory examination: Secondary | ICD-10-CM | POA: Diagnosis not present

## 2015-06-02 DIAGNOSIS — I509 Heart failure, unspecified: Secondary | ICD-10-CM | POA: Diagnosis not present

## 2015-06-02 DIAGNOSIS — Z01810 Encounter for preprocedural cardiovascular examination: Secondary | ICD-10-CM | POA: Insufficient documentation

## 2015-06-02 DIAGNOSIS — M75121 Complete rotator cuff tear or rupture of right shoulder, not specified as traumatic: Secondary | ICD-10-CM | POA: Diagnosis not present

## 2015-06-02 HISTORY — DX: Acute myocardial infarction, unspecified: I21.9

## 2015-06-02 HISTORY — DX: Gastro-esophageal reflux disease without esophagitis: K21.9

## 2015-06-02 HISTORY — DX: Diverticulosis of intestine, part unspecified, without perforation or abscess without bleeding: K57.90

## 2015-06-02 HISTORY — DX: Polyneuropathy, unspecified: G62.9

## 2015-06-02 HISTORY — DX: Unspecified osteoarthritis, unspecified site: M19.90

## 2015-06-02 LAB — POTASSIUM: POTASSIUM: 3.9 mmol/L (ref 3.5–5.1)

## 2015-06-02 NOTE — Telephone Encounter (Signed)
Received fax for clearance Dr. Sherryll Burger will complete when he returns to office, office notes from last office visit has been faxed per request by Tiffaney T at [facility]

## 2015-06-02 NOTE — Telephone Encounter (Signed)
Tiffany from [facility] with Dr Joice Lofts will be faxing over a surgical clearance form. He is having the right shoulder arthroscopy which is scheduled for 06-06-15. Is it possible that another doctor can fill this out so that this can be done by surgery date?

## 2015-06-02 NOTE — Pre-Procedure Instructions (Signed)
Called Dr. Pernell Dupre regarding patient cardiac history and EKG. EKG from 2015 and today EKG sent to anesthesia for review.

## 2015-06-02 NOTE — Pre-Procedure Instructions (Signed)
Dr. Yolanda Bonine patient for surgery.

## 2015-06-02 NOTE — Patient Instructions (Signed)
  Your procedure is scheduled on: June 06, 2015 (Tuesday) Report to Day Surgery.[facility])  Second Floor To find out your arrival time please call [phone] between 1PM - 3PM on Arrival time 9:45 am.  Remember: Instructions that are not followed completely may result in serious medical risk, up to and including death, or upon the discretion of your surgeon and anesthesiologist your surgery may need to be rescheduled.    __x__ 1. Do not eat food or drink liquids after midnight. No gum chewing or hard candies.     __x__ 2. No Alcohol for 24 hours before or after surgery.   ____ 3. Bring all medications with you on the day of surgery if instructed.    __x__ 4. Notify your doctor if there is any change in your medical condition     (cold, fever, infections).     Do not wear jewelry, make-up, hairpins, clips or nail polish.  Do not wear lotions, powders, or perfumes. You may wear deodorant.  Do not shave 48 hours prior to surgery. Men may shave face and neck.  Do not bring valuables to the hospital.    [facility] is not responsible for any belongings or valuables.               Contacts, dentures or bridgework may not be worn into surgery.  Leave your suitcase in the car. After surgery it may be brought to your room.  For patients admitted to the hospital, discharge time is determined by your                treatment team.   Patients discharged the day of surgery will not be allowed to drive home.   Please read over the following fact sheets that you were given:   Surgical Site Infection Prevention   __x__ Take these medicines the morning of surgery with A SIP OF WATER:    1. Gabapentin    2. Omeprazole   ____ Fleet Enema (as directed)   __x__ Use CHG Soap as directed  ____ Use inhalers on the day of surgery  ____ Stop metformin 2 days prior to surgery    ____ Take 1/2 of usual insulin dose the night before surgery and none on the morning of surgery.   __x__ Stop  Coumadin/Plavix/aspirin on (PATIENT HAS STOPPED ASPIRIN PER OFFICE)  _x___ Stop Anti-inflammatories on (STOP ALEVE AND NAPROXEN NOW ) TYLENOL OK TO TAKE FOR PAIN IF NEEDED   _x ___ Stop supplements until after surgery. (STOP VITAMIN  E  NOW)   ____ Bring C-Pap to the hospital.

## 2015-06-06 ENCOUNTER — Ambulatory Visit
Admission: RE | Admit: 2015-06-06 | Discharge: 2015-06-06 | Disposition: A | Discharge status: Home / Self Care | Payer: 59 | Source: Ambulatory Visit | Attending: Surgery | Admitting: Surgery

## 2015-06-06 ENCOUNTER — Encounter: Payer: Self-pay | Admitting: Surgery

## 2015-06-06 ENCOUNTER — Ambulatory Visit: Payer: 59 | Admitting: Certified Registered Nurse Anesthetist

## 2015-06-06 ENCOUNTER — Encounter
Admission: RE | Disposition: A | Discharge status: Home / Self Care | Payer: Self-pay | Source: Ambulatory Visit | Attending: Surgery

## 2015-06-06 DIAGNOSIS — I252 Old myocardial infarction: Secondary | ICD-10-CM | POA: Insufficient documentation

## 2015-06-06 DIAGNOSIS — Y939 Activity, unspecified: Secondary | ICD-10-CM | POA: Insufficient documentation

## 2015-06-06 DIAGNOSIS — Z79899 Other long term (current) drug therapy: Secondary | ICD-10-CM | POA: Insufficient documentation

## 2015-06-06 DIAGNOSIS — M24111 Other articular cartilage disorders, right shoulder: Secondary | ICD-10-CM | POA: Diagnosis not present

## 2015-06-06 DIAGNOSIS — Z7982 Long term (current) use of aspirin: Secondary | ICD-10-CM | POA: Diagnosis not present

## 2015-06-06 DIAGNOSIS — M75121 Complete rotator cuff tear or rupture of right shoulder, not specified as traumatic: Secondary | ICD-10-CM | POA: Diagnosis not present

## 2015-06-06 DIAGNOSIS — Z5333 Arthroscopic surgical procedure converted to open procedure: Secondary | ICD-10-CM | POA: Diagnosis not present

## 2015-06-06 DIAGNOSIS — Z951 Presence of aortocoronary bypass graft: Secondary | ICD-10-CM | POA: Diagnosis not present

## 2015-06-06 DIAGNOSIS — M94211 Chondromalacia, right shoulder: Secondary | ICD-10-CM | POA: Diagnosis not present

## 2015-06-06 DIAGNOSIS — G8918 Other acute postprocedural pain: Secondary | ICD-10-CM | POA: Diagnosis not present

## 2015-06-06 DIAGNOSIS — M25519 Pain in unspecified shoulder: Secondary | ICD-10-CM | POA: Diagnosis not present

## 2015-06-06 DIAGNOSIS — I509 Heart failure, unspecified: Secondary | ICD-10-CM | POA: Insufficient documentation

## 2015-06-06 DIAGNOSIS — M7581 Other shoulder lesions, right shoulder: Secondary | ICD-10-CM | POA: Diagnosis not present

## 2015-06-06 DIAGNOSIS — Z9889 Other specified postprocedural states: Secondary | ICD-10-CM | POA: Diagnosis not present

## 2015-06-06 DIAGNOSIS — Z87891 Personal history of nicotine dependence: Secondary | ICD-10-CM | POA: Insufficient documentation

## 2015-06-06 DIAGNOSIS — I2510 Atherosclerotic heart disease of native coronary artery without angina pectoris: Secondary | ICD-10-CM | POA: Insufficient documentation

## 2015-06-06 DIAGNOSIS — X58XXXA Exposure to other specified factors, initial encounter: Secondary | ICD-10-CM | POA: Insufficient documentation

## 2015-06-06 DIAGNOSIS — M25511 Pain in right shoulder: Secondary | ICD-10-CM | POA: Insufficient documentation

## 2015-06-06 DIAGNOSIS — M67911 Unspecified disorder of synovium and tendon, right shoulder: Secondary | ICD-10-CM | POA: Insufficient documentation

## 2015-06-06 DIAGNOSIS — Z885 Allergy status to narcotic agent status: Secondary | ICD-10-CM | POA: Insufficient documentation

## 2015-06-06 DIAGNOSIS — M7541 Impingement syndrome of right shoulder: Secondary | ICD-10-CM | POA: Diagnosis not present

## 2015-06-06 DIAGNOSIS — M549 Dorsalgia, unspecified: Secondary | ICD-10-CM | POA: Diagnosis not present

## 2015-06-06 DIAGNOSIS — I209 Angina pectoris, unspecified: Secondary | ICD-10-CM | POA: Diagnosis not present

## 2015-06-06 DIAGNOSIS — S43431A Superior glenoid labrum lesion of right shoulder, initial encounter: Secondary | ICD-10-CM | POA: Insufficient documentation

## 2015-06-06 DIAGNOSIS — Z91018 Allergy to other foods: Secondary | ICD-10-CM | POA: Diagnosis not present

## 2015-06-06 HISTORY — PX: SHOULDER ARTHROSCOPY: SHX128

## 2015-06-06 LAB — CBC WITH DIFFERENTIAL/PLATELET
BASOS PCT: 0 %
Basophils Absolute: 0.1 10*3/uL (ref 0–0.1)
EOS ABS: 0.2 10*3/uL (ref 0–0.7)
Eosinophils Relative: 1 %
HCT: 41 % (ref 40.0–52.0)
HEMOGLOBIN: 14.1 g/dL (ref 13.0–18.0)
LYMPHS ABS: 2.2 10*3/uL (ref 1.0–3.6)
Lymphocytes Relative: 18 %
MCH: 31.2 pg (ref 26.0–34.0)
MCHC: 34.4 g/dL (ref 32.0–36.0)
MCV: 90.8 fL (ref 80.0–100.0)
MONO ABS: 1.3 10*3/uL — AB (ref 0.2–1.0)
MONOS PCT: 11 %
Neutro Abs: 8.7 10*3/uL — ABNORMAL HIGH (ref 1.4–6.5)
Neutrophils Relative %: 70 %
Platelets: 206 10*3/uL (ref 150–440)
RBC: 4.52 MIL/uL (ref 4.40–5.90)
RDW: 13.4 % (ref 11.5–14.5)
WBC: 12.4 10*3/uL — ABNORMAL HIGH (ref 3.8–10.6)

## 2015-06-06 LAB — COMPREHENSIVE METABOLIC PANEL
ALBUMIN: 4.1 g/dL (ref 3.5–5.0)
ALK PHOS: 86 U/L (ref 38–126)
ALT: 46 U/L (ref 17–63)
ANION GAP: 7 (ref 5–15)
AST: 44 U/L — ABNORMAL HIGH (ref 15–41)
BILIRUBIN TOTAL: 0.9 mg/dL (ref 0.3–1.2)
BUN: 19 mg/dL (ref 6–20)
CALCIUM: 9.2 mg/dL (ref 8.9–10.3)
CO2: 25 mmol/L (ref 22–32)
Chloride: 102 mmol/L (ref 101–111)
Creatinine, Ser: 0.71 mg/dL (ref 0.61–1.24)
GFR calc non Af Amer: >60 mL/min (ref >60)
GLUCOSE: 124 mg/dL — AB (ref 65–99)
POTASSIUM: 4 mmol/L (ref 3.5–5.1)
SODIUM: 134 mmol/L — AB (ref 135–145)
TOTAL PROTEIN: 7.5 g/dL (ref 6.5–8.1)

## 2015-06-06 SURGERY — ARTHROSCOPY, SHOULDER
Anesthesia: General | Laterality: Right

## 2015-06-06 MED ORDER — LIDOCAINE HCL (PF) 1 % IJ SOLN
INTRAMUSCULAR | Status: AC
  Filled 2015-06-06: qty 5

## 2015-06-06 MED ORDER — MIDAZOLAM HCL 5 MG/5ML IJ SOLN
INTRAMUSCULAR | Status: AC
  Filled 2015-06-06: qty 5

## 2015-06-06 MED ORDER — BUPIVACAINE-EPINEPHRINE (PF) 0.5% -1:200000 IJ SOLN
INTRAMUSCULAR | Status: AC
Start: 2015-06-06 — End: 2015-06-06
  Filled 2015-06-06: qty 30

## 2015-06-06 MED ORDER — METOCLOPRAMIDE HCL 10 MG PO TABS: 5.0000 mg | ORAL_TABLET | Freq: Three times a day (TID) | ORAL | Status: DC | PRN

## 2015-06-06 MED ORDER — ROCURONIUM BROMIDE 100 MG/10ML IV SOLN
INTRAVENOUS | Status: DC | PRN
  Administered 2015-06-06: 5 mg via INTRAVENOUS

## 2015-06-06 MED ORDER — OXYCODONE HCL 5 MG PO TABS: 5.0000 mg | ORAL_TABLET | ORAL | Status: DC | PRN

## 2015-06-06 MED ORDER — MIDAZOLAM HCL 5 MG/5ML IJ SOLN
0.5000 mg | Freq: Once | INTRAMUSCULAR | Status: AC
  Administered 2015-06-06: 0.5 mg via INTRAVENOUS

## 2015-06-06 MED ORDER — SUCCINYLCHOLINE CHLORIDE 20 MG/ML IJ SOLN
INTRAMUSCULAR | Status: DC | PRN
  Administered 2015-06-06: 140 mg via INTRAVENOUS

## 2015-06-06 MED ORDER — ACETAMINOPHEN 10 MG/ML IV SOLN
INTRAVENOUS | Status: AC
  Filled 2015-06-06: qty 100

## 2015-06-06 MED ORDER — FENTANYL CITRATE (PF) 100 MCG/2ML IJ SOLN
INTRAMUSCULAR | Status: DC | PRN
  Administered 2015-06-06: 50 ug via INTRAVENOUS

## 2015-06-06 MED ORDER — EPINEPHRINE HCL 1 MG/ML IJ SOLN
INTRAMUSCULAR | Status: AC
  Filled 2015-06-06: qty 1

## 2015-06-06 MED ORDER — PROPOFOL 10 MG/ML IV BOLUS
INTRAVENOUS | Status: DC | PRN
  Administered 2015-06-06: 150 mg via INTRAVENOUS
  Administered 2015-06-06: 30 mg via INTRAVENOUS

## 2015-06-06 MED ORDER — METOCLOPRAMIDE HCL 5 MG/ML IJ SOLN: 5.0000 mg | Freq: Three times a day (TID) | INTRAMUSCULAR | Status: DC | PRN

## 2015-06-06 MED ORDER — CEFAZOLIN SODIUM-DEXTROSE 2-3 GM-% IV SOLR
2.0000 g | Freq: Once | INTRAVENOUS | Status: AC
  Administered 2015-06-06: 2 g via INTRAVENOUS

## 2015-06-06 MED ORDER — ACETAMINOPHEN 10 MG/ML IV SOLN
INTRAVENOUS | Status: DC | PRN
  Administered 2015-06-06: 1000 mg via INTRAVENOUS

## 2015-06-06 MED ORDER — CEFAZOLIN SODIUM-DEXTROSE 2-3 GM-% IV SOLR
INTRAVENOUS | Status: AC
  Filled 2015-06-06: qty 50

## 2015-06-06 MED ORDER — FENTANYL CITRATE (PF) 100 MCG/2ML IJ SOLN: 25.0000 ug | INTRAMUSCULAR | Status: DC | PRN

## 2015-06-06 MED ORDER — ONDANSETRON HCL 4 MG/2ML IJ SOLN: 4.0000 mg | Freq: Once | INTRAMUSCULAR | Status: DC | PRN

## 2015-06-06 MED ORDER — ONDANSETRON HCL 4 MG/2ML IJ SOLN
INTRAMUSCULAR | Status: DC | PRN
  Administered 2015-06-06: 4 mg via INTRAVENOUS

## 2015-06-06 MED ORDER — EPINEPHRINE HCL 1 MG/ML IJ SOLN
INTRAMUSCULAR | Status: DC | PRN
  Administered 2015-06-06: 2 mL

## 2015-06-06 MED ORDER — MIDAZOLAM HCL 5 MG/5ML IJ SOLN
1.0000 mg | Freq: Once | INTRAMUSCULAR | Status: AC
  Administered 2015-06-06: 1 mg via INTRAVENOUS

## 2015-06-06 MED ORDER — ONDANSETRON HCL 4 MG PO TABS: 4.0000 mg | ORAL_TABLET | Freq: Four times a day (QID) | ORAL | Status: DC | PRN

## 2015-06-06 MED ORDER — LACTATED RINGERS IV SOLN
INTRAVENOUS | Status: DC
  Administered 2015-06-06: 11:00:00 via INTRAVENOUS

## 2015-06-06 MED ORDER — BUPIVACAINE-EPINEPHRINE (PF) 0.5% -1:200000 IJ SOLN
INTRAMUSCULAR | Status: DC | PRN
Start: 2015-06-06 — End: 2015-06-06
  Administered 2015-06-06: 29 mL via PERINEURAL

## 2015-06-06 MED ORDER — LIDOCAINE HCL (CARDIAC) 20 MG/ML IV SOLN
INTRAVENOUS | Status: DC | PRN
  Administered 2015-06-06: 80 mg via INTRAVENOUS

## 2015-06-06 MED ORDER — POTASSIUM CHLORIDE IN NACL 20-0.9 MEQ/L-% IV SOLN: INTRAVENOUS | Status: DC

## 2015-06-06 MED ORDER — OXYCODONE-ACETAMINOPHEN 5-325 MG PO TABS
ORAL_TABLET | ORAL | Status: AC
  Administered 2015-06-06: 1 via ORAL
  Filled 2015-06-06: qty 1

## 2015-06-06 MED ORDER — ONDANSETRON HCL 4 MG/2ML IJ SOLN: 4.0000 mg | Freq: Four times a day (QID) | INTRAMUSCULAR | Status: DC | PRN

## 2015-06-06 MED ORDER — OXYCODONE-ACETAMINOPHEN 5-325 MG PO TABS
1.0000 | ORAL_TABLET | Freq: Once | ORAL | Status: AC
  Administered 2015-06-06: 1 via ORAL

## 2015-06-06 MED ORDER — ROPIVACAINE HCL 2 MG/ML IJ SOLN
INTRAMUSCULAR | Status: AC
  Filled 2015-06-06: qty 40

## 2015-06-06 SURGICAL SUPPLY — 52 items
ANCHOR JUGGERKNOT WTAP NDL 2.9 (Anchor) ×6 IMPLANT
ANCHOR SUT QUATTRO KNTLS 4.5 (Anchor) ×3 IMPLANT
ANCHOR SUT W/ ORTHOCORD (Anchor) ×3 IMPLANT
BIT DRILL JUGRKNT W/NDL BIT2.9 (DRILL) ×1 IMPLANT
BLADE FULL RADIUS 3.5 (BLADE) ×3 IMPLANT
BLADE SHAVER 4.5X7 STR FR (MISCELLANEOUS) IMPLANT
BUR ACROMIONIZER 4.0 (BURR) ×3 IMPLANT
BUR BR 5.5 WIDE MOUTH (BURR) ×3 IMPLANT
CANNULA 8.5X75 THRED (CANNULA) IMPLANT
CANNULA SHAVER 8MMX76MM (CANNULA) ×6 IMPLANT
CHLORAPREP W/TINT 26ML (MISCELLANEOUS) ×6 IMPLANT
DRAPE IMP U-DRAPE 54X76 (DRAPES) ×6 IMPLANT
DRAPE SURG 17X11 SM STRL (DRAPES) ×3 IMPLANT
DRILL JUGGERKNOT W/NDL BIT 2.9 (DRILL) ×3
DRSG OPSITE POSTOP 4X8 (GAUZE/BANDAGES/DRESSINGS) ×3 IMPLANT
GAUZE PETRO XEROFOAM 1X8 (MISCELLANEOUS) ×3 IMPLANT
GAUZE SPONGE 4X4 12PLY STRL (GAUZE/BANDAGES/DRESSINGS) ×3 IMPLANT
GLOVE BIO SURGEON STRL SZ7.5 (GLOVE) ×6 IMPLANT
GLOVE BIO SURGEON STRL SZ8 (GLOVE) ×6 IMPLANT
GLOVE BIOGEL PI IND STRL 8 (GLOVE) ×1 IMPLANT
GLOVE BIOGEL PI INDICATOR 8 (GLOVE) ×2
GLOVE INDICATOR 8.0 STRL GRN (GLOVE) ×3 IMPLANT
GOWN STRL REUS W/ TWL LRG LVL3 (GOWN DISPOSABLE) ×2 IMPLANT
GOWN STRL REUS W/ TWL XL LVL3 (GOWN DISPOSABLE) ×1 IMPLANT
GOWN STRL REUS W/TWL LRG LVL3 (GOWN DISPOSABLE) ×4
GOWN STRL REUS W/TWL XL LVL3 (GOWN DISPOSABLE) ×2
GRASPER SUT 15 45D LOW PRO (SUTURE) ×3 IMPLANT
IV LACTATED RINGER IRRG 3000ML (IV SOLUTION) ×4
IV LR IRRIG 3000ML ARTHROMATIC (IV SOLUTION) ×2 IMPLANT
MANIFOLD NEPTUNE II (INSTRUMENTS) ×3 IMPLANT
MASK FACE SPIDER DISP (MASK) ×3 IMPLANT
MAT BLUE FLOOR 46X72 FLO (MISCELLANEOUS) ×3 IMPLANT
NDL MAYO CATGUT SZ4 (NEEDLE) IMPLANT
NEEDLE MAYO 6 CRC TAPER PT (NEEDLE) IMPLANT
NEEDLE MAYO CATGUT SZ 1.5 (NEEDLE)
NEEDLE MAYO CATGUT SZ 2 (NEEDLE) IMPLANT
NEEDLE REVERSE CUT 1/2 CRC (NEEDLE) IMPLANT
PACK ARTHROSCOPY SHOULDER (MISCELLANEOUS) ×3 IMPLANT
PAD GROUND ADULT SPLIT (MISCELLANEOUS) ×3 IMPLANT
SLING ARM LRG DEEP (SOFTGOODS) ×3 IMPLANT
SLING ULTRA II LG (MISCELLANEOUS) ×3 IMPLANT
STAPLER SKIN PROX 35W (STAPLE) ×3 IMPLANT
STRAP SAFETY BODY (MISCELLANEOUS) ×3 IMPLANT
SUT ETHIBOND 0 MO6 C/R (SUTURE) ×3 IMPLANT
SUT PROLENE 4 0 PS 2 18 (SUTURE) ×3 IMPLANT
SUT VIC AB 2-0 CT1 27 (SUTURE) ×4
SUT VIC AB 2-0 CT1 TAPERPNT 27 (SUTURE) ×2 IMPLANT
TAPE MICROFOAM 4IN (TAPE) ×3 IMPLANT
TUBING ARTHRO INFLOW-ONLY STRL (TUBING) ×3 IMPLANT
TUBING CONNECTING 10 (TUBING) ×2 IMPLANT
TUBING CONNECTING 10' (TUBING) ×1
WAND HAND CNTRL MULTIVAC 90 (MISCELLANEOUS) ×3 IMPLANT

## 2015-06-06 NOTE — Transfer of Care (Signed)
Immediate Anesthesia Transfer of Care Note  Patient: [name] [name]  Procedure(s) Performed: Procedure(s): ARTHROSCOPY SHOULDER (Right)  Patient Location: PACU  Anesthesia Type:General  Level of Consciousness: sedated  Airway & Oxygen Therapy: Patient Spontanous Breathing and Patient connected to nasal cannula oxygen  Post-op Assessment: Report given to RN and Post -op Vital signs reviewed and stable  Post vital signs: Reviewed and stable  Last Vitals:  Filed Vitals:   06/06/15 0954  BP: 130/76  Pulse: 66  Temp: 37.1 C  Resp: 16    Complications: No apparent anesthesia complications

## 2015-06-06 NOTE — Discharge Instructions (Addendum)
Keep dressing dry and intact.  °May shower after dressing changed on post-op day #4 (Saturday).  °Cover staples with Band-Aids after drying off. °Apply ice frequently to shoulder. °Keep shoulder immobilizer on at all times except may remove for bathing purposes. Follow-up in 10-14 days or as scheduled. ° °General Anesthesia, Adult, Care After °Refer to this sheet in the next few weeks. These instructions provide you with information on caring for yourself after your procedure. Your health care provider may also give you more specific instructions. Your treatment has been planned according to current medical practices, but problems sometimes occur. Call your health care provider if you have any problems or questions after your procedure. °WHAT TO EXPECT AFTER THE PROCEDURE °After the procedure, it is typical to experience: °· Sleepiness. °· Nausea and vomiting. °HOME CARE INSTRUCTIONS °· For the first 24 hours after general anesthesia: °¨ Have a responsible person with you. °¨ Do not drive a car. If you are alone, do not take public transportation. °¨ Do not drink alcohol. °¨ Do not take medicine that has not been prescribed by your health care provider. °¨ Do not sign important papers or make important decisions. °¨ You may resume a normal diet and activities as directed by your health care provider. °· Change bandages (dressings) as directed. °· If you have questions or problems that seem related to general anesthesia, call the hospital and ask for the anesthetist or anesthesiologist on call. °SEEK MEDICAL CARE IF: °· You have nausea and vomiting that continue the day after anesthesia. °· You develop a rash. °SEEK IMMEDIATE MEDICAL CARE IF:  °· You have difficulty breathing. °· You have chest pain. °· You have any allergic problems. °  °This information is not intended to replace advice given to you by your health care provider. Make sure you discuss any questions you have with your health care provider. °    °Document Released: 08/26/2000 Document Revised: 06/10/2014 Document Reviewed: 09/18/2011 °Elsevier Interactive Patient Education ©2016 [employer] ° °

## 2015-06-06 NOTE — ED Notes (Signed)
Had right rotator cuff repair today and feels burning all over, also co pain to shoulder.  Took tramadol at 2030 without relief.

## 2015-06-06 NOTE — Op Note (Signed)
06/06/2015  1:18 PM  Patient:   [name] [name]  Pre-Op Diagnosis:   Impingement/tendinopathy with full-thickness rotator cuff tear and biceps tendinopathy, right shoulder  Postoperative diagnosis: Impingement/tendinopathy with full-thickness rotator cuff tear, biceps tendinopathy, and SLAP tear, right shoulder.  Procedure: Arthroscopic SLAP repair, extensive arthroscopic debridement, arthroscopic subacromial decompression, mini-open rotator cuff repair, and mini-open biceps tenodesis, right shoulder.  Anesthesia: General endotracheal with interscalene block placed preoperatively by the anesthesiologist.  Surgeon:   Maryagnes Amos, MD  Assistant:   Horris Latino, PA-C  Findings: As above. There was a degenerative superior labral tear extending from the 11:00 to the 1:00 position, as well as extensive bicipital tendon hepatic changes. There was a full-thickness tear of the anterior insertional fibers the rotator cuff measuring approximately 2 x 3 cm. There also was some partial tearing of the superior insertional fibers of the subscapularis tendon permitting some medial subluxation of the biceps tendon. There were grade 2 chondromalacial changes of the glenoid. The humeral head articular surface was in satisfactory condition.  Complications: None  Fluids:   600 cc  Estimated blood loss: 10 cc  Tourniquet time: None  Drains: None  Closure: Staples   Brief clinical note: The patient is a 70 year old male with a one month history of right shoulder pain following an injury in which he apparently fell while getting out of his truck and landed on his outstretched right arm. The patient's symptoms have progressed despite medications, activity modification, etc. The patient's history and examination are consistent with impingement/tendinopathy with a rotator cuff tear and biceps tendinopathy. These findings were confirmed by an arthro/CT scan. The patient presents at  this time for definitive management of these shoulder symptoms.  Procedure: The patient underwent placement of an interscalene block by the anesthesiologist in the preoperative holding area before he was brought into the operating room and lain in the supine position. The patient then underwent general endotracheal intubation and anesthesia before being repositioned in the beach chair position using the beach chair positioner. The right shoulder and upper extremity were prepped with ChloraPrep solution before being draped sterilely. Preoperative antibiotics were administered. A timeout was performed to confirm the proper surgical site before the expected portal sites and incision site were injected with 0.5% Sensorcaine with epinephrine. A posterior portal was created and the glenohumeral joint thoroughly inspected with the findings as described above. An anterior portal was created using an outside-in technique. The labrum and rotator cuff were further probed, again confirming the above-noted findings. The areas of labral fraying was debrided using the full-radius resector, as were the torn margins of the rotator cuff and areas of extensive synovitis anteriorly, posteriorly, and superiorly. The biceps tendon was released from its labral attachment using the ArthroCare wand before the exposed glenoid rim was roughened with an end-cutting rasp and full-radius resector. The superior labrum was stabilized using a single Mitek BioKnotless anchor placed at the 12 o'clock position. Subsequent probing of the repair demonstrated excellent stability. The ArthroCare wand was re-inserted and used to obtain hemostasis as well as to "anneal" the labrum superiorly and anteriorly. The instruments were removed from the joint after suctioning the excess fluid.  The camera was repositioned through the posterior portal into the subacromial space. A separate lateral portal was created using an outside-in technique. The 3.5 mm  full-radius resector was introduced and used to perform a subtotal bursectomy. The ArthroCare wand was then inserted and used to remove the periosteal tissue off the undersurface of the anterior  third of the acromion as well as to recess the coracoacromial ligament from its attachment along the anterior and lateral margins of the acromion. The 4.0 mm acromionizing bur was introduced and used to complete the decompression by removing the undersurface of the anterior third of the acromion. The full radius resector was reintroduced to remove any residual bony debris before the ArthroCare wand was re-introduced to obtain hemostasis. The instruments were then removed from the subacromial space after suctioning the excess fluid.  An approximately 4-5 cm incision was made over the anterolateral aspect of the shoulder beginning at the anterolateral corner of the acromion and extending distally in line with the bicipital groove. This incision was carried down through the subcutaneous tissues to expose the deltoid fascia. The raphae between the anterior and middle thirds was identified and this plane developed to provide access into the subacromial space. Additional bursal tissues were debrided sharply using Metzenbaum scissors. The rotator cuff tear was readily identified. The margins were debrided sharply with a #15 blade and the exposed greater tuberosity roughened with a rongeur. The tear was repaired using a single Biomet 2.9 mm JuggerKnot anchor. The ends of these sutures were brought back laterally and secured using a Biomet [employer] anchor to create a two-layer closure. Several #0 Ethibond interrupted sutures were placed in a side-to-side fashion to reapproximate the longitudinal portion of the tear. An apparent watertight closure was obtained.  The bicipital groove was identified by palpation and opened for 1-1.5 cm. The biceps tendon stump was retrieved through this defect. The floor of the bicipital  groove was roughened with a curet before another Biomet 2.9 mm JuggerKnot anchor was inserted. Both sets of sutures were passed through the biceps tendon and tied securely to effect the tenodesis. The bicipital sheath was reapproximated using two #0 Ethibond interrupted sutures, incorporating the biceps tendon to further reinforce the tenodesis.  The wound was copiously irrigated with sterile saline solution before the deltoid raphae was reapproximated using 2-0 Vicryl interrupted sutures. The subcutaneous tissues were closed in two layers using 2-0 Vicryl interrupted sutures before the skin was closed using staples. The portal sites also were closed using staples. A sterile bulky dressing was applied to the shoulder before the arm was placed into a shoulder immobilizer. The patient was then awakened, extubated, and returned to the recovery room in satisfactory condition after tolerating the procedure well.

## 2015-06-06 NOTE — OR Nursing (Signed)
To pacu for block 

## 2015-06-06 NOTE — Anesthesia Procedure Notes (Signed)
Procedure Name: Intubation Date/Time: 06/06/2015 11:32 AM Performed by: Ginger Carne Pre-anesthesia Checklist: Patient identified, Emergency Drugs available, Suction available, Patient being monitored and Timeout performed Patient Re-evaluated:Patient Re-evaluated prior to inductionOxygen Delivery Method: Circle system utilized Preoxygenation: Pre-oxygenation with 100% oxygen Intubation Type: IV induction Ventilation: Mask ventilation without difficulty and Oral airway inserted - appropriate to patient size Laryngoscope Size: Hyacinth Meeker and 2 Grade View: Grade I Tube type: Oral Number of attempts: 1 Airway Equipment and Method: Stylet Placement Confirmation: ETT inserted through vocal cords under direct vision,  positive ETCO2 and breath sounds checked- equal and bilateral Secured at: 23 cm Tube secured with: Tape

## 2015-06-06 NOTE — H&P (Signed)
Paper H&P to be scanned into permanent record. H&P reviewed. No changes. 

## 2015-06-06 NOTE — ED Notes (Signed)
Pt arrived to ED with c/o increased right shoulder pain after surgery procedure today. Pt states "I feel like I am burning from the inside out". Pt reports taking pain medication without relief.

## 2015-06-06 NOTE — Anesthesia Preprocedure Evaluation (Signed)
Anesthesia Evaluation  Patient identified by MRN, date of birth, ID band Patient awake    Reviewed: Allergy & Precautions, H&P , NPO status , Patient's Chart, lab work & pertinent test results, reviewed documented beta blocker date and time   Airway Mallampati: II  TM Distance: >3 FB Neck ROM: full    Dental  (+) Edentulous Upper, Edentulous Lower   Pulmonary neg pulmonary ROS, Current Smoker, former smoker,    Pulmonary exam normal        Cardiovascular hypertension, + angina + CAD, + Past MI and +CHF  negative cardio ROS Normal cardiovascular exam Rhythm:regular Rate:Normal  S/p recent CABG with stable status cardiac function and followed by his PCP.  No reports of recent cardiac problems or changes in cardiac sx.  Will proceed with GOT.  JA   Neuro/Psych  Neuromuscular disease negative neurological ROS  negative psych ROS   GI/Hepatic negative GI ROS, Neg liver ROS, GERD  ,  Endo/Other  negative endocrine ROS  Renal/GU negative Renal ROS  negative genitourinary   Musculoskeletal   Abdominal   Peds  Hematology negative hematology ROS (+)   Anesthesia Other Findings   Reproductive/Obstetrics negative OB ROS                             Anesthesia Physical Anesthesia Plan  ASA: III  Anesthesia Plan: General ETT   Post-op Pain Management: MAC Combined w/ Regional for Post-op pain   Induction:   Airway Management Planned:   Additional Equipment:   Intra-op Plan:   Post-operative Plan:   Informed Consent: I have reviewed the patients History and Physical, chart, labs and discussed the procedure including the risks, benefits and alternatives for the proposed anesthesia with the patient or authorized representative who has indicated his/her understanding and acceptance.     Plan Discussed with: CRNA  Anesthesia Plan Comments:         Anesthesia Quick Evaluation

## 2015-06-07 ENCOUNTER — Emergency Department
Admission: EM | Admit: 2015-06-07 | Discharge: 2015-06-07 | Disposition: A | Discharge status: Home / Self Care | Payer: 59 | Attending: Emergency Medicine | Admitting: Emergency Medicine

## 2015-06-07 DIAGNOSIS — G8918 Other acute postprocedural pain: Secondary | ICD-10-CM | POA: Diagnosis not present

## 2015-06-07 MED ORDER — ONDANSETRON 4 MG PO TBDP
4.0000 mg | ORAL_TABLET | Freq: Once | ORAL | Status: AC
  Administered 2015-06-07: 4 mg via ORAL
  Filled 2015-06-07: qty 1

## 2015-06-07 MED ORDER — MORPHINE SULFATE (PF) 4 MG/ML IV SOLN
4.0000 mg | Freq: Once | INTRAVENOUS | Status: AC
  Administered 2015-06-07: 4 mg via INTRAMUSCULAR
  Filled 2015-06-07: qty 1

## 2015-06-07 NOTE — ED Provider Notes (Signed)
[facility] Emergency Department Provider Note  ____________________________________________  Time seen: 3:00 AM  I have reviewed the triage vital signs and the nursing notes.   HISTORY  Chief Complaint Post-op Problem    HPI [name] [name] is a 70 y.o. male presents with a 9 out of 10 right shoulder pain status post rotator cuff surgery performed yesterday by Dr. Joice Lofts. Patient states feels like burning all over. Patient took tramadol at 8:30 without relief however patient was also prescribed oxycodone 5 mg tablets which she did not take.     Past Medical History  Diagnosis Date  . Coronary artery disease   . Anginal pain (HCC)   . Back pain     degenerative disc disease  . CHF (congestive heart failure) (HCC)   . Myocardial infarction (HCC)   . GERD (gastroesophageal reflux disease)   . Arthritis   . Diverticulosis   . Neuropathy [facility])     Patient Active Problem List   Diagnosis Date Noted  . Elevation of level of transaminase and lactic acid dehydrogenase (LDH) 05/11/2015  . Back pain with radiation 05/11/2015  . Cardiomyopathy (HCC) 05/11/2015  . Esophageal lesion 05/11/2015  . Pain in the wrist 05/11/2015  . Low back pain with sciatica 05/11/2015  . Heart attack (HCC) 05/11/2015  . Absent peripheral pulse 05/11/2015  . ERRONEOUS ENCOUNTER--DISREGARD 04/26/2015  . Back pain, chronic 02/03/2015  . Leg swelling 02/03/2015  . Edema leg 02/03/2015  . CAD in native artery 02/03/2015  . Narrowing of intervertebral disc space 02/03/2015  . Acid reflux 02/03/2015  . Hypercholesteremia 02/03/2015  . BP (high blood pressure) 02/03/2015  . Cardiomyopathy, ischemic 02/03/2015  . Arthritis of hand, degenerative 02/03/2015  . Insomnia secondary to chronic pain 11/14/2014  . 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 Drug-induced hypokalemia 11/14/2014  . History of decompression of median nerve 10/03/2014  . Acquired polyneuropathy  (HCC) 09/28/2014  . Carpal tunnel syndrome 09/28/2014  . Peripheral neuropathy (HCC) 09/28/2014  . Chest pain 10/20/2013  . Other specified cardiac arrhythmias 08/23/2013  . H/O coronary artery bypass surgery 08/21/2013  . Lumbar radiculopathy 08/19/2013  . Difficulty hearing 08/19/2013  . Arteriosclerosis of coronary artery 08/19/2013  . Esophageal lump 08/19/2013    Past Surgical History  Procedure Laterality Date  . Back surgery N/A     lower back x 3  . Neck surgery N/A     neck x 2, one discectomy and one shaved disc.  Marland Kitchen Carpal tunnel release Left 03/2014    In Dr. Rosita Kea office  . Ankle reconstruction Left 1991  . Appendectomy    . Carpal tunnel release Left 10/20/2014    Procedure: CARPAL TUNNEL RELEASE;  Surgeon: Kennedy Bucker, MD;  Location: [facility] ORS;  Service: Orthopedics;  Laterality: Left;  . Shoulder open rotator cuff repair      Left  . Cardiac catheterization    . Eye surgery Bilateral     Cataract Extraction with IOL  . Coronary artery bypass graft  March 2014    [facility]  . Shoulder arthroscopy Right 06/06/2015    Procedure: right shoulder arthroscopy, arthroscopic debridement, subacromial decompression, SLAP repair, biotenodesis, mini open rotator cuff repair;  Surgeon: Christena Flake, MD;  Location: [facility] ORS;  Service: Orthopedics;  Laterality: Right;    Current Outpatient Rx  Name  Route  Sig  Dispense  Refill  . aspirin 81 MG tablet   Oral   Take 81 mg by  mouth every morning.         . cyclobenzaprine (FLEXERIL) 10 MG tablet   Oral   Take 1 tablet (10 mg total) by mouth 2 (two) times daily as needed.   60 tablet   0   . dicyclomine (BENTYL) 10 MG capsule   Oral   Take 1 capsule (10 mg total) by mouth 2 (two) times daily.   60 capsule   5   . docusate sodium (COLACE) 100 MG capsule   Oral   Take 100 mg by mouth at bedtime.         . furosemide (LASIX) 20 MG tablet   Oral   Take 1 tablet (20 mg total) by mouth 2 (two) times  daily. Patient taking differently: Take 20 mg by mouth daily.    60 tablet   2   . gabapentin (NEURONTIN) 300 MG capsule   Oral   Take 1 capsule (300 mg total) by mouth 2 (two) times daily.   60 capsule   0   . nitroGLYCERIN (NITROSTAT) 0.4 MG SL tablet   Sublingual   Place 1 tablet (0.4 mg total) under the tongue every 5 (five) minutes as needed for chest pain. Patient not taking: Reported on 06/06/2015   30 tablet   0   . omeprazole (PRILOSEC) 40 MG capsule   Oral   Take 1 capsule (40 mg total) by mouth daily. Patient taking differently: Take 40 mg by mouth at bedtime.    30 capsule   3   . oxyCODONE (ROXICODONE) 5 MG immediate release tablet   Oral   Take 1-2 tablets (5-10 mg total) by mouth every 4 (four) hours as needed for severe pain.   60 tablet   0   . potassium chloride SA (KLOR-CON M20) 20 MEQ tablet   Oral   Take 1 tablet (20 mEq total) by mouth daily.   30 tablet   5   . vitamin E (VITAMIN E) 400 UNIT capsule   Oral   Take 400 Units by mouth daily.           Allergies Parafon forte dsc and Cucumber extract  No family history on file.  Social History Social History  Substance Use Topics  . Smoking status: Former Smoker    Quit date: 06/18/2013  . Smokeless tobacco: Never Used  . Alcohol Use: 0.0 - 0.6 oz/week    0-1 Cans of beer per week     Comment: occasionally    Review of Systems  Constitutional: Negative for fever. Eyes: Negative for visual changes. ENT: Negative for sore throat. Cardiovascular: Negative for chest pain. Respiratory: Negative for shortness of breath. Gastrointestinal: Negative for abdominal pain, vomiting and diarrhea. Genitourinary: Negative for dysuria. Musculoskeletal: Negative for back pain. Positive for right shoulder pain Skin: Negative for rash. Neurological: Negative for headaches, focal weakness or numbness.   10-point ROS otherwise negative.  ____________________________________________   PHYSICAL  EXAM:  VITAL SIGNS: ED Triage Vitals  Enc Vitals Group     BP 06/06/15 2141 136/70 mmHg     Pulse Rate 06/06/15 2141 64     Resp 06/06/15 2141 24     Temp 06/06/15 2141 97.5 F (36.4 C)     Temp Source 06/06/15 2141 Oral     SpO2 06/06/15 2141 97 %     Weight 06/06/15 2141 218 lb (98.884 kg)     Height 06/06/15 2141 5\' 7"  (1.702 m)     Head Cir --  Peak Flow --      Pain Score 06/06/15 2150 7     Pain Loc --      Pain Edu? --      Excl. in GC? --    Constitutional: Alert and oriented. Well appearing and in no distress. Eyes: Conjunctivae are normal. PERRL. Normal extraocular movements. ENT   Head: Normocephalic and atraumatic.   Nose: No congestion/rhinnorhea.   Mouth/Throat: Mucous membranes are moist.   Neck: No stridor. Hematological/Lymphatic/Immunilogical: No cervical lymphadenopathy. Cardiovascular: Normal rate, regular rhythm. Normal and symmetric distal pulses are present in all extremities. No murmurs, rubs, or gallops. Respiratory: Normal respiratory effort without tachypnea nor retractions. Breath sounds are clear and equal bilaterally. No wheezes/rales/rhonchi. Gastrointestinal: Soft and nontender. No distention. There is no CVA tenderness. Genitourinary: deferred Musculoskeletal: Nontender with normal range of motion in all extremities. No joint effusions.  No lower extremity tenderness nor edema. Neurologic:  Normal speech and language. No gross focal neurologic deficits are appreciated. Speech is normal.  Skin:  Skin is warm, dry and intact. No rash noted. Psychiatric: Mood and affect are normal. Speech and behavior are normal. Patient exhibits appropriate insight and judgment.       INITIAL IMPRESSION / ASSESSMENT AND PLAN / ED COURSE  Pertinent labs & imaging results that were available during my care of the patient were reviewed by me and considered in my medical decision making (see chart for details).  Patient received IM morphine 4  mg a pain improvement  ____________________________________________   FINAL CLINICAL IMPRESSION(S) / ED DIAGNOSES  Final diagnoses:  Postoperative pain      Darci Current, MD 06/07/15 0451

## 2015-06-07 NOTE — ED Notes (Signed)
MD at bedside. 

## 2015-06-07 NOTE — ED Notes (Addendum)
Pt had rotar cuff surgery today, has had no pain relief at home, pt A&O, denies any fever but c/o heat . Pt has positive pulses

## 2015-06-07 NOTE — Discharge Instructions (Signed)
Pain Relief Preoperatively and Postoperatively  If you have questions, problems, or concerns about the pain that you may feel after surgery, let your health care provider know. Patients have the right to assessment and management of pain. Severe pain after surgery--and the fear or anxiety associated with that pain--may cause extreme discomfort that:  · Prevents sleep.  · Decreases the ability to breathe deeply and to cough. This can result in pneumonia or other upper airway infections.  · Causes the heart to beat more quickly and the blood pressure to be higher.  · Increases the risk for constipation and bloating.  · Decreases the ability of wounds to heal.  · May result in depression, increased anxiety, and feelings of helplessness.  Relieving pain before surgery (preoperatively) is also important because it lessens pain that you have after surgery (postoperatively). Patients who receive pain relief both before and after surgery experience greater pain relief than those who receive pain relief only after surgery. Let your health care provider know if you are having uncontrolled pain. This is very important. Pain after surgery is more difficult to manage if it is severe, so receiving prompt and adequate treatment of acute pain is necessary. If you become constipated after taking pain medicine, drink more liquids if you can. Your health care provider may have you take a mild laxative.  PAIN CONTROL METHODS  Your health care providers follow policies and procedures about the management of your pain. These guidelines should be explained to you before surgery. Plans for pain control after surgery must be decided upon by you and your health care provider and put into use with your full understanding and agreement. Do not be afraid to ask questions about the care that you are receiving.  Your health care providers will attempt to control your pain in various ways, and these methods may be used together (multimodal  analgesia). Using this approach has many benefits for you, including being able to eat, move around, and leave the hospital sooner.  As-Needed Pain Control  · You may be given pain medicine through an IV tube or as a pill or liquid that you can swallow. Let your health care provider know when you are having pain, and he or she will give you the pain medicine that is ordered for you.  IV Patient-Controlled Analgesia (PCA) Pump  · You can receive your pain medicine through an IV tube that goes into one of your veins. You can control the amount of pain medicine that you get. The pain medicine is controlled by a pump. When you push the button that is hooked up to this pump, you receive a specific amount of pain medicine. This button should be pushed only by you or by someone who is specifically assigned by you to do so. It is set up to keep you from accidentally giving yourself too much pain medicine. You will be able to start using your pain pump in the recovery room after your surgery. This method can be helpful for most types of surgery.  · Tell your health care provider:    If you are having too much pain.    If you are feeling too sleepy or nauseous.  Continuous Epidural Pain Control  · A thin, soft tube (catheter) is put into your back, outside the outer layer of your spinal cord. Pain medicine flows through the catheter to lessen pain in areas of your body that are below the level of catheter placement. Continuous epidural pain control may work best for you   if you are having surgery on your abdomen, hip area, or legs. The epidural catheter is usually put into your back shortly before surgery. It is left in until you can eat, take medicine by mouth, pass urine, and have a bowel movement.  · Giving pain medicine through the epidural catheter may help you to heal more quickly because you can do these things sooner:    Regain normal bowel and bladder function.    Return to eating.    Get up and walk.  Medicine That  Numbs the Area (Local Anesthetic)  You may be given pain medicine:  · As an injection near the area of the pain (local infiltration).  · As an injection near the nerve that controls the sensation to a specific part of your body (peripheral nerve block).  · In your spine to block pain (spinal block).  · Through a local anesthetic reservoir pump. If your surgeon or anesthesiologist selects this option as a part of your pain control, one or more thin, soft tubes will be inserted into your incision site(s) at the end of surgery. These tubes will be connected to a device that is filled with a non-narcotic pain medicine. This medicine gradually empties into your incision site over the next several days. Usually, after all of the medicine is used, your health care provider will remove the tubes and throw away the device.  Opioids  · Moderate to moderately severe acute pain after surgery may respond to opioids. Opioids are narcotic pain medicine. Opioids are often combined with non-narcotic medicines to improve pain relief, lower the risk of side effects, and reduce the chance of addiction.  · If you follow your health care provider's directions about taking opioids and you do not have a history of substance abuse, your risk of becoming addicted is very small. To prevent addiction, opioids are given for short periods of time in careful doses.  Other Methods of Pain Control  · Steroids.  · Physical therapy.  · Heat and cold therapy.  · Compression, such as wrapping an elastic bandage around the area of the pain.  · Massage.     This information is not intended to replace advice given to you by your health care provider. Make sure you discuss any questions you have with your health care provider.     Document Released: 08/10/2002 Document Revised: 06/10/2014 Document Reviewed: 08/14/2010  Elsevier Interactive Patient Education ©2016 [employer]

## 2015-06-08 ENCOUNTER — Encounter: Payer: Self-pay | Admitting: Surgery

## 2015-06-11 NOTE — Anesthesia Postprocedure Evaluation (Signed)
Anesthesia Post Note  Patient: ALOK MEZZANOTTE  Procedure(s) Performed: Procedure(s) (LRB): right shoulder arthroscopy, arthroscopic debridement, subacromial decompression, SLAP repair, biotenodesis, mini open rotator cuff repair (Right)  Patient location during evaluation: PACU Anesthesia Type: General Level of consciousness: awake and alert Pain management: pain level controlled Vital Signs Assessment: post-procedure vital signs reviewed and stable Respiratory status: spontaneous breathing, nonlabored ventilation, respiratory function stable and patient connected to nasal cannula oxygen Cardiovascular status: blood pressure returned to baseline and stable Postop Assessment: no signs of nausea or vomiting Anesthetic complications: no    Last Vitals:  Filed Vitals:   06/06/15 1429 06/06/15 1448  BP: 150/73 127/76  Pulse: 85 86  Temp:    Resp: 16 16    Last Pain:  Filed Vitals:   06/07/15 0834  PainSc: 8                  Yevette Edwards

## 2015-06-13 ENCOUNTER — Telehealth: Payer: Self-pay | Admitting: Family Medicine

## 2015-06-13 ENCOUNTER — Telehealth: Payer: Self-pay

## 2015-06-13 DIAGNOSIS — M5416 Radiculopathy, lumbar region: Secondary | ICD-10-CM

## 2015-06-13 DIAGNOSIS — T50905A Adverse effect of unspecified drugs, medicaments and biological substances, initial encounter: Principal | ICD-10-CM

## 2015-06-13 DIAGNOSIS — E876 Hypokalemia: Secondary | ICD-10-CM

## 2015-06-13 DIAGNOSIS — Z86018 Personal history of other benign neoplasm: Secondary | ICD-10-CM

## 2015-06-13 MED ORDER — POTASSIUM CHLORIDE CRYS ER 20 MEQ PO TBCR: 20.0000 meq | EXTENDED_RELEASE_TABLET | Freq: Every day | ORAL | Status: DC

## 2015-06-13 MED ORDER — GABAPENTIN 300 MG PO CAPS: 300.0000 mg | ORAL_CAPSULE | Freq: Two times a day (BID) | ORAL | Status: DC

## 2015-06-13 MED ORDER — DICYCLOMINE HCL 10 MG PO CAPS: 10.0000 mg | ORAL_CAPSULE | Freq: Two times a day (BID) | ORAL | Status: DC

## 2015-06-13 NOTE — Telephone Encounter (Signed)
Medication has been refilled and sent to [facility] [address] 

## 2015-06-13 NOTE — Telephone Encounter (Signed)
Medication has been refilled and sent to [facility] [address] °

## 2015-06-13 NOTE — Telephone Encounter (Signed)
Pts wife called stating pt needs refills on Klorikon, Dicyclamine, Flexeril, and Gabepentine to be sent to [address]. He has 2 days worth of his meds. Wife would like to pick these up this afternoon at the pharmacy.

## 2015-06-16 ENCOUNTER — Other Ambulatory Visit: Payer: Self-pay | Admitting: Family Medicine

## 2015-06-16 DIAGNOSIS — M549 Dorsalgia, unspecified: Principal | ICD-10-CM

## 2015-06-16 DIAGNOSIS — G8929 Other chronic pain: Secondary | ICD-10-CM

## 2015-06-16 MED ORDER — CYCLOBENZAPRINE HCL 10 MG PO TABS: 10.0000 mg | ORAL_TABLET | Freq: Two times a day (BID) | ORAL | Status: DC | PRN

## 2015-06-16 NOTE — Telephone Encounter (Signed)
Medication has been refilled per Dr. Sherryll Burger

## 2015-06-16 NOTE — Telephone Encounter (Signed)
Rose (wife) would like to speak with Dr Sherryll Burger no later than Monday. If not then she will come to the office. She states that 4 days ago she requested a refill for Flexeril and as of today she has not heard anything. Please return call [phone]

## 2015-07-06 DIAGNOSIS — G8929 Other chronic pain: Secondary | ICD-10-CM | POA: Diagnosis not present

## 2015-07-06 DIAGNOSIS — Z9889 Other specified postprocedural states: Secondary | ICD-10-CM | POA: Diagnosis not present

## 2015-07-06 DIAGNOSIS — M25511 Pain in right shoulder: Secondary | ICD-10-CM | POA: Diagnosis not present

## 2015-07-10 ENCOUNTER — Encounter: Payer: Self-pay | Admitting: Emergency Medicine

## 2015-07-10 ENCOUNTER — Emergency Department
Admission: EM | Admit: 2015-07-10 | Discharge: 2015-07-10 | Disposition: A | Discharge status: Home / Self Care | Payer: Medicare Other | Attending: Emergency Medicine | Admitting: Emergency Medicine

## 2015-07-10 ENCOUNTER — Emergency Department: Payer: Medicare Other

## 2015-07-10 DIAGNOSIS — R197 Diarrhea, unspecified: Secondary | ICD-10-CM | POA: Insufficient documentation

## 2015-07-10 DIAGNOSIS — Z79899 Other long term (current) drug therapy: Secondary | ICD-10-CM | POA: Diagnosis not present

## 2015-07-10 DIAGNOSIS — Z7982 Long term (current) use of aspirin: Secondary | ICD-10-CM | POA: Insufficient documentation

## 2015-07-10 DIAGNOSIS — R6883 Chills (without fever): Secondary | ICD-10-CM | POA: Insufficient documentation

## 2015-07-10 DIAGNOSIS — Z87891 Personal history of nicotine dependence: Secondary | ICD-10-CM | POA: Insufficient documentation

## 2015-07-10 DIAGNOSIS — R109 Unspecified abdominal pain: Secondary | ICD-10-CM | POA: Diagnosis not present

## 2015-07-10 DIAGNOSIS — R42 Dizziness and giddiness: Secondary | ICD-10-CM | POA: Insufficient documentation

## 2015-07-10 LAB — COMPREHENSIVE METABOLIC PANEL
ALBUMIN: 4 g/dL (ref 3.5–5.0)
ALK PHOS: 102 U/L (ref 38–126)
ALT: 38 U/L (ref 17–63)
AST: 53 U/L — ABNORMAL HIGH (ref 15–41)
Anion gap: 11 (ref 5–15)
BUN: 21 mg/dL — ABNORMAL HIGH (ref 6–20)
CALCIUM: 10 mg/dL (ref 8.9–10.3)
CO2: 24 mmol/L (ref 22–32)
CREATININE: 0.79 mg/dL (ref 0.61–1.24)
Chloride: 102 mmol/L (ref 101–111)
GFR calc Af Amer: >60 mL/min (ref >60)
GFR calc non Af Amer: >60 mL/min (ref >60)
GLUCOSE: 119 mg/dL — AB (ref 65–99)
Potassium: 4.2 mmol/L (ref 3.5–5.1)
SODIUM: 137 mmol/L (ref 135–145)
Total Bilirubin: 0.8 mg/dL (ref 0.3–1.2)
Total Protein: 8.2 g/dL — ABNORMAL HIGH (ref 6.5–8.1)

## 2015-07-10 LAB — CBC
HCT: 41.7 % (ref 40.0–52.0)
HEMOGLOBIN: 14.5 g/dL (ref 13.0–18.0)
MCH: 30.7 pg (ref 26.0–34.0)
MCHC: 34.8 g/dL (ref 32.0–36.0)
MCV: 88.3 fL (ref 80.0–100.0)
PLATELETS: 324 10*3/uL (ref 150–440)
RBC: 4.72 MIL/uL (ref 4.40–5.90)
RDW: 13.2 % (ref 11.5–14.5)
WBC: 10.6 10*3/uL (ref 3.8–10.6)

## 2015-07-10 LAB — C DIFFICILE QUICK SCREEN W PCR REFLEX
C DIFFICLE (CDIFF) ANTIGEN: NEGATIVE
C Diff interpretation: NEGATIVE
C Diff toxin: NEGATIVE

## 2015-07-10 MED ORDER — IOHEXOL 240 MG/ML SOLN
25.0000 mL | Freq: Once | INTRAMUSCULAR | Status: AC | PRN
Start: 2015-07-10 — End: 2015-07-10
  Administered 2015-07-10: 25 mL via ORAL

## 2015-07-10 MED ORDER — MORPHINE SULFATE (PF) 2 MG/ML IV SOLN
2.0000 mg | Freq: Once | INTRAVENOUS | Status: AC
  Administered 2015-07-10: 2 mg via INTRAVENOUS
  Filled 2015-07-10: qty 1

## 2015-07-10 MED ORDER — ONDANSETRON HCL 4 MG/2ML IJ SOLN
4.0000 mg | Freq: Once | INTRAMUSCULAR | Status: AC
  Administered 2015-07-10: 4 mg via INTRAVENOUS
  Filled 2015-07-10: qty 2

## 2015-07-10 MED ORDER — SODIUM CHLORIDE 0.9 % IV SOLN
Freq: Once | INTRAVENOUS | Status: AC
  Administered 2015-07-10: 17:00:00 via INTRAVENOUS

## 2015-07-10 MED ORDER — IOHEXOL 300 MG/ML  SOLN
100.0000 mL | Freq: Once | INTRAMUSCULAR | Status: AC | PRN
  Administered 2015-07-10: 100 mL via INTRAVENOUS

## 2015-07-10 MED ORDER — SODIUM CHLORIDE 0.9 % IV SOLN
Freq: Once | INTRAVENOUS | Status: AC
  Administered 2015-07-10: 18:00:00 via INTRAVENOUS

## 2015-07-10 NOTE — ED Notes (Signed)
Very small loose Bm per bedside toilet, no significant odor.

## 2015-07-10 NOTE — ED Provider Notes (Signed)
[facility] Emergency Department Provider Note  ____________________________________________  Time seen: Approximately 3:26 PM  I have reviewed the triage vital signs and the nursing notes.   HISTORY  Chief Complaint Diarrhea and Abdominal Pain    HPI [name] [name] is a 70 y.o. male patient reports frequent episodes of diarrhea since Saturday. He does not have a fever but has had cold chills. He has severe cramps with it. He has had dry heaves he is not drinking anything. He feels lightheaded when he gets up. As a history of C. difficile in the past. The cramps are diffuse throughout the abdomen.   Past Medical History  Diagnosis Date  . Coronary artery disease   . Anginal pain (HCC)   . Back pain     degenerative disc disease  . CHF (congestive heart failure) (HCC)   . Myocardial infarction (HCC)   . GERD (gastroesophageal reflux disease)   . Arthritis   . Diverticulosis   . Neuropathy [facility])     Patient Active Problem List   Diagnosis Date Noted  . Elevation of level of transaminase and lactic acid dehydrogenase (LDH) 05/11/2015  . Back pain with radiation 05/11/2015  . Cardiomyopathy (HCC) 05/11/2015  . Esophageal lesion 05/11/2015  . Pain in the wrist 05/11/2015  . Low back pain with sciatica 05/11/2015  . Heart attack (HCC) 05/11/2015  . Absent peripheral pulse 05/11/2015  . ERRONEOUS ENCOUNTER--DISREGARD 04/26/2015  . Back pain, chronic 02/03/2015  . Leg swelling 02/03/2015  . Edema leg 02/03/2015  . CAD in native artery 02/03/2015  . Narrowing of intervertebral disc space 02/03/2015  . Acid reflux 02/03/2015  . Hypercholesteremia 02/03/2015  . BP (high blood pressure) 02/03/2015  . Cardiomyopathy, ischemic 02/03/2015  . Arthritis of hand, degenerative 02/03/2015  . Insomnia secondary to chronic pain 11/14/2014  . 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 Drug-induced hypokalemia 11/14/2014  . History of decompression of  median nerve 10/03/2014  . Acquired polyneuropathy (HCC) 09/28/2014  . Carpal tunnel syndrome 09/28/2014  . Peripheral neuropathy (HCC) 09/28/2014  . Chest pain 10/20/2013  . Other specified cardiac arrhythmias 08/23/2013  . H/O coronary artery bypass surgery 08/21/2013  . Lumbar radiculopathy 08/19/2013  . Difficulty hearing 08/19/2013  . Arteriosclerosis of coronary artery 08/19/2013  . Esophageal lump 08/19/2013    Past Surgical History  Procedure Laterality Date  . Back surgery N/A     lower back x 3  . Neck surgery N/A     neck x 2, one discectomy and one shaved disc.  Marland Kitchen Carpal tunnel release Left 03/2014    In Dr. Rosita Kea office  . Ankle reconstruction Left 1991  . Appendectomy    . Carpal tunnel release Left 10/20/2014    Procedure: CARPAL TUNNEL RELEASE;  Surgeon: Kennedy Bucker, MD;  Location: [facility] ORS;  Service: Orthopedics;  Laterality: Left;  . Shoulder open rotator cuff repair      Left  . Cardiac catheterization    . Eye surgery Bilateral     Cataract Extraction with IOL  . Coronary artery bypass graft  March 2014    [facility]  . Shoulder arthroscopy Right 06/06/2015    Procedure: right shoulder arthroscopy, arthroscopic debridement, subacromial decompression, SLAP repair, biotenodesis, mini open rotator cuff repair;  Surgeon: Christena Flake, MD;  Location: [facility] ORS;  Service: Orthopedics;  Laterality: Right;    Current Outpatient Rx  Name  Route  Sig  Dispense  Refill  . aspirin 81  MG tablet   Oral   Take 81 mg by mouth every morning.         . cyclobenzaprine (FLEXERIL) 10 MG tablet   Oral   Take 1 tablet (10 mg total) by mouth 2 (two) times daily as needed.   60 tablet   0   . dicyclomine (BENTYL) 10 MG capsule   Oral   Take 1 capsule (10 mg total) by mouth 2 (two) times daily.   60 capsule   5   . docusate sodium (COLACE) 100 MG capsule   Oral   Take 100 mg by mouth at bedtime.         . furosemide (LASIX) 20 MG tablet   Oral   Take 1  tablet (20 mg total) by mouth 2 (two) times daily. Patient taking differently: Take 20 mg by mouth daily.    60 tablet   2   . gabapentin (NEURONTIN) 300 MG capsule   Oral   Take 1 capsule (300 mg total) by mouth 2 (two) times daily.   60 capsule   0   . nitroGLYCERIN (NITROSTAT) 0.4 MG SL tablet   Sublingual   Place 1 tablet (0.4 mg total) under the tongue every 5 (five) minutes as needed for chest pain. Patient not taking: Reported on 06/06/2015   30 tablet   0   . omeprazole (PRILOSEC) 40 MG capsule   Oral   Take 1 capsule (40 mg total) by mouth daily. Patient taking differently: Take 40 mg by mouth at bedtime.    30 capsule   3   . oxyCODONE (ROXICODONE) 5 MG immediate release tablet   Oral   Take 1-2 tablets (5-10 mg total) by mouth every 4 (four) hours as needed for severe pain.   60 tablet   0   . potassium chloride SA (KLOR-CON M20) 20 MEQ tablet   Oral   Take 1 tablet (20 mEq total) by mouth daily.   30 tablet   5   . vitamin E (VITAMIN E) 400 UNIT capsule   Oral   Take 400 Units by mouth daily.           Allergies Parafon forte dsc and Cucumber extract  History reviewed. No pertinent family history.  Social History Social History  Substance Use Topics  . Smoking status: Former Smoker    Quit date: 06/18/2013  . Smokeless tobacco: Never Used  . Alcohol Use: 0.0 - 0.6 oz/week    0-1 Cans of beer per week     Comment: occasionally    Review of Systems Constitutional: No fever Eyes: No visual changes. ENT: No sore throat. Cardiovascular: Denies chest pain. Respiratory: Denies shortness of breath. Gastrointestinal see history of present illness Genitourinary: Negative for dysuria. Musculoskeletal: Negative for back pain. Skin: Negative for rash. Neurological: Negative for headaches, focal weakness or numbness.  10-point ROS otherwise negative.  ____________________________________________   PHYSICAL EXAM:  VITAL SIGNS: ED Triage  Vitals  Enc Vitals Group     BP 07/10/15 1236 136/75 mmHg     Pulse Rate 07/10/15 1236 82     Resp 07/10/15 1236 20     Temp 07/10/15 1236 97.6 F (36.4 C)     Temp Source 07/10/15 1236 Oral     SpO2 07/10/15 1236 100 %     Weight 07/10/15 1236 212 lb (96.163 kg)     Height 07/10/15 1236 5\' 8"  (1.727 m)     Head Cir --  Peak Flow --      Pain Score --      Pain Loc --      Pain Edu? --      Excl. in GC? --     Constitutional: Alert and oriented. Well appearing and in no acute distress. Eyes: Conjunctivae are normal. PERRL. EOMI. Head: Atraumatic. Nose: No congestion/rhinnorhea. Mouth/Throat: Mucous membranes are moist.  Oropharynx non-erythematous. Neck: No stridor. Cardiovascular: Normal rate, regular rhythm. Grossly normal heart sounds.  Good peripheral circulation. Respiratory: Normal respiratory effort.  No retractions. Lungs CTAB. Gastrointestinal: Soft diffusely tender decreased bowel sounds No distention. No abdominal bruits. No CVA tenderness. }Musculoskeletal: No lower extremity tenderness nor edema.  No joint effusions. Neurologic:  Normal speech and language. No gross focal neurologic deficits are appreciated. No gait instability. Skin:  Skin is warm, dry and intact. No rash noted.   ____________________________________________   LABS (all labs ordered are listed, but only abnormal results are displayed)  Labs Reviewed  COMPREHENSIVE METABOLIC PANEL - Abnormal; Notable for the following:    Glucose, Bld 119 (*)    BUN 21 (*)    Total Protein 8.2 (*)    AST 53 (*)    All other components within normal limits  C DIFFICILE QUICK SCREEN W PCR REFLEX  CBC   ____________________________________________  EKG   ____________________________________________  RADIOLOGY  CT of the abdomen shows no acute pathology per radiology ____________________________________________   PROCEDURES    ____________________________________________   INITIAL  IMPRESSION / ASSESSMENT AND PLAN / ED COURSE  Pertinent labs & imaging results that were available during my care of the patient were reviewed by me and considered in my medical decision making (see chart for details).  Patient doing well tolerates by mouth we'll discharge him. ____________________________________________   FINAL CLINICAL IMPRESSION(S) / ED DIAGNOSES  Final diagnoses:  Diarrhea, unspecified type      Arnaldo Natal, MD 07/11/15 [phone]

## 2015-07-10 NOTE — ED Notes (Signed)
Stool sample sent to lab

## 2015-07-10 NOTE — ED Notes (Signed)
Pt presents to Ed for c/o diarrhea since Saturday morning. Per patient he has hx of c-diff.

## 2015-07-11 ENCOUNTER — Ambulatory Visit: Payer: Medicare Other | Admitting: Family Medicine

## 2015-07-12 ENCOUNTER — Ambulatory Visit (INDEPENDENT_AMBULATORY_CARE_PROVIDER_SITE_OTHER): Payer: Medicare Other | Admitting: Family Medicine

## 2015-07-12 ENCOUNTER — Encounter: Payer: Self-pay | Admitting: Family Medicine

## 2015-07-12 VITALS — BP 138/76 | HR 81 | Temp 98.4°F | Resp 19 | Ht 67.0 in | Wt 210.9 lb

## 2015-07-12 DIAGNOSIS — M5441 Lumbago with sciatica, right side: Secondary | ICD-10-CM

## 2015-07-12 DIAGNOSIS — G6289 Other specified polyneuropathies: Secondary | ICD-10-CM

## 2015-07-12 DIAGNOSIS — G8929 Other chronic pain: Secondary | ICD-10-CM | POA: Diagnosis not present

## 2015-07-12 DIAGNOSIS — M7989 Other specified soft tissue disorders: Secondary | ICD-10-CM | POA: Diagnosis not present

## 2015-07-12 MED ORDER — OXYCODONE HCL 10 MG PO TABS: 10.0000 mg | ORAL_TABLET | Freq: Two times a day (BID) | ORAL | Status: DC | PRN

## 2015-07-12 MED ORDER — FUROSEMIDE 20 MG PO TABS: 20.0000 mg | ORAL_TABLET | Freq: Two times a day (BID) | ORAL | Status: DC

## 2015-07-12 MED ORDER — GABAPENTIN 300 MG PO CAPS: 300.0000 mg | ORAL_CAPSULE | Freq: Two times a day (BID) | ORAL | Status: DC

## 2015-07-12 NOTE — Progress Notes (Signed)
Name: [name] [name]   MRN: [id number]    DOB: Oct 19, 1945   Date:07/12/2015       Progress Note  Subjective  Chief Complaint  Chief Complaint  Patient presents with  . Medication Refill    Back Pain This is a chronic problem. The problem occurs daily. The pain is present in the lumbar spine. The quality of the pain is described as burning and shooting. The pain radiates to the right thigh. The pain is at a severity of 4/10. The symptoms are aggravated by position and standing (Especially worse with being on his feet at his job.). Associated symptoms include numbness, paresthesias and tingling. Pertinent negatives include no abdominal pain, bladder incontinence, bowel incontinence, chest pain or fever. He has tried analgesics and muscle relaxant (Gabapentin for foot pain, Oxycodone & Cyclobenzaprine for low back pain) for the symptoms.   Leg Swelling Patient is on Furosemide 20 mg twice daily for lower leg swelling. No swelling today, pt. has been staying off his feet more since his shoulder injury 2 months ago.  Peripheral Neuropathy Pt. Has numbness and burning sensation in his feet, present for 4-5 years. Recently gotten worse (B12 was normal, pt. Does not have Diabetes). He was started on Gabapentin 300 mg twice daily, which helps relieve his symptoms.  Past Medical History  Diagnosis Date  . Coronary artery disease   . Anginal pain (HCC)   . Back pain     degenerative disc disease  . CHF (congestive heart failure) (HCC)   . Myocardial infarction (HCC)   . GERD (gastroesophageal reflux disease)   . Arthritis   . Diverticulosis   . Neuropathy [facility])     Past Surgical History  Procedure Laterality Date  . Back surgery N/A     lower back x 3  . Neck surgery N/A     neck x 2, one discectomy and one shaved disc.  Marland Kitchen Carpal tunnel release Left 03/2014    In Dr. Rosita Kea office  . Ankle reconstruction Left 1991  . Appendectomy    . Carpal tunnel release Left 10/20/2014   Procedure: CARPAL TUNNEL RELEASE;  Surgeon: Kennedy Bucker, MD;  Location: [facility] ORS;  Service: Orthopedics;  Laterality: Left;  . Shoulder open rotator cuff repair      Left  . Cardiac catheterization    . Eye surgery Bilateral     Cataract Extraction with IOL  . Coronary artery bypass graft  March 2014    [facility]  . Shoulder arthroscopy Right 06/06/2015    Procedure: right shoulder arthroscopy, arthroscopic debridement, subacromial decompression, SLAP repair, biotenodesis, mini open rotator cuff repair;  Surgeon: Christena Flake, MD;  Location: [facility] ORS;  Service: Orthopedics;  Laterality: Right;    History reviewed. No pertinent family history.  Social History   Social History  . Marital Status: Married    Spouse Name: N/A  . Number of Children: N/A  . Years of Education: N/A   Occupational History  . Not on file.   Social History Main Topics  . Smoking status: Former Smoker    Quit date: 06/18/2013  . Smokeless tobacco: Never Used  . Alcohol Use: 0.0 - 0.6 oz/week    0-1 Cans of beer per week     Comment: occasionally  . Drug Use: No  . Sexual Activity: Not on file   Other Topics Concern  . Not on file   Social History Narrative     Current outpatient prescriptions:  .  aspirin 81 MG tablet, Take 81 mg by mouth every morning., Disp: , Rfl:  .  cyclobenzaprine (FLEXERIL) 10 MG tablet, Take 1 tablet (10 mg total) by mouth 2 (two) times daily as needed., Disp: 60 tablet, Rfl: 0 .  dicyclomine (BENTYL) 10 MG capsule, Take 1 capsule (10 mg total) by mouth 2 (two) times daily., Disp: 60 capsule, Rfl: 5 .  docusate sodium (COLACE) 100 MG capsule, Take 100 mg by mouth at bedtime., Disp: , Rfl:  .  furosemide (LASIX) 20 MG tablet, Take 1 tablet (20 mg total) by mouth 2 (two) times daily. (Patient taking differently: Take 20 mg by mouth daily. ), Disp: 60 tablet, Rfl: 2 .  gabapentin (NEURONTIN) 300 MG capsule, Take 1 capsule (300 mg total) by mouth 2 (two) times daily.,  Disp: 60 capsule, Rfl: 0 .  nitroGLYCERIN (NITROSTAT) 0.4 MG SL tablet, Place 1 tablet (0.4 mg total) under the tongue every 5 (five) minutes as needed for chest pain., Disp: 30 tablet, Rfl: 0 .  omeprazole (PRILOSEC) 40 MG capsule, Take 1 capsule (40 mg total) by mouth daily. (Patient taking differently: Take 40 mg by mouth at bedtime. ), Disp: 30 capsule, Rfl: 3 .  oxyCODONE (ROXICODONE) 5 MG immediate release tablet, Take 1-2 tablets (5-10 mg total) by mouth every 4 (four) hours as needed for severe pain., Disp: 60 tablet, Rfl: 0 .  potassium chloride SA (KLOR-CON M20) 20 MEQ tablet, Take 1 tablet (20 mEq total) by mouth daily., Disp: 30 tablet, Rfl: 5 .  vitamin E (VITAMIN E) 400 UNIT capsule, Take 400 Units by mouth daily., Disp: , Rfl:   Allergies  Allergen Reactions  . Parafon Forte Dsc [Chlorzoxazone] Rash and Itching    severe  . Cucumber Extract Other (See Comments)    GI upset     Review of Systems  Constitutional: Negative for fever and chills.  Respiratory: Negative for shortness of breath.   Cardiovascular: Negative for chest pain, palpitations and leg swelling.  Gastrointestinal: Negative for nausea, vomiting, abdominal pain and bowel incontinence.  Genitourinary: Negative for bladder incontinence.  Musculoskeletal: Positive for back pain and joint pain.  Neurological: Positive for tingling, numbness and paresthesias.    Objective  Filed Vitals:   07/12/15 1133  BP: 138/76  Pulse: 81  Temp: 98.4 F (36.9 C)  TempSrc: Oral  Resp: 19  Height: 5\' 7"  (1.702 m)  Weight: 210 lb 14.4 oz (95.664 kg)  SpO2: 96%    Physical Exam  Constitutional: He is oriented to person, place, and time and well-developed, well-nourished, and in no distress.  HENT:  Head: Normocephalic and atraumatic.  Cardiovascular: Normal rate and regular rhythm.   Pulmonary/Chest: Effort normal and breath sounds normal.  Abdominal: Soft. Bowel sounds are normal.  Musculoskeletal:       Left  ankle: He exhibits no swelling.       Lumbar back: He exhibits pain and spasm.       Back:  Neurological: He is alert and oriented to person, place, and time.  Nursing note and vitals reviewed.    Assessment & Plan  1. Chronic right-sided low back pain with right-sided sciatica  stable and responsive to opioid therapy. Patient will be restarted back at his usual dose of oxycodone 10 mg twice a day when necessary. Refills provided. - Oxycodone HCl 10 MG TABS; Take 1 tablet (10 mg total) by mouth every 12 (twelve) hours as needed.  Dispense: 60 tablet; Refill: 0  2. Leg swelling  -  furosemide (LASIX) 20 MG tablet; Take 1 tablet (20 mg total) by mouth 2 (two) times daily.  Dispense: 60 tablet; Refill: 2  3. Other polyneuropathy (HCC) Peripheral neuropathic pain, responsive to gabapentin. - gabapentin (NEURONTIN) 300 MG capsule; Take 1 capsule (300 mg total) by mouth 2 (two) times daily.  Dispense: 60 capsule; Refill: 2   Janean Eischen Asad [facility] 07/12/2015 11:49 AM

## 2015-07-16 ENCOUNTER — Other Ambulatory Visit: Payer: Self-pay | Admitting: Family Medicine

## 2015-07-17 DIAGNOSIS — G8929 Other chronic pain: Secondary | ICD-10-CM | POA: Diagnosis not present

## 2015-07-17 DIAGNOSIS — Z9889 Other specified postprocedural states: Secondary | ICD-10-CM | POA: Diagnosis not present

## 2015-07-17 DIAGNOSIS — M25511 Pain in right shoulder: Secondary | ICD-10-CM | POA: Diagnosis not present

## 2015-07-19 ENCOUNTER — Telehealth: Payer: Self-pay | Admitting: Family Medicine

## 2015-07-19 NOTE — Telephone Encounter (Signed)
PT ASKING FOR AN ANTIOBOTIC FOR SINUS AND PT HUNG UP BEFORE GETTING HIS PHONE NUMBER TO CALL HIM BACK ON.

## 2015-07-20 ENCOUNTER — Telehealth: Payer: Self-pay

## 2015-07-20 DIAGNOSIS — J019 Acute sinusitis, unspecified: Secondary | ICD-10-CM | POA: Insufficient documentation

## 2015-07-20 MED ORDER — AZITHROMYCIN 250 MG PO TABS: ORAL_TABLET | ORAL | Status: DC

## 2015-07-20 NOTE — Telephone Encounter (Signed)
Routed to Dr. Shah for new prescription request 

## 2015-07-20 NOTE — Telephone Encounter (Signed)
Pt. Reports symptoms of pressure over his sinuses and forehead, runny nose, scratchy throat, and a slight cough. No fevers, chills, or shortness of breath. Symptoms present for 2 days. Pt. Requesting an antibiotic be called in to his pharmacy. I explained that he most likely has viral sinusitis and it should get better on its own but if not, then he may start taking the antibiotic after 3 days. Rx for Z-pak sent to pharmacy.

## 2015-07-24 DIAGNOSIS — Z9889 Other specified postprocedural states: Secondary | ICD-10-CM | POA: Diagnosis not present

## 2015-07-25 DIAGNOSIS — H903 Sensorineural hearing loss, bilateral: Secondary | ICD-10-CM | POA: Diagnosis not present

## 2015-07-25 DIAGNOSIS — H6123 Impacted cerumen, bilateral: Secondary | ICD-10-CM | POA: Diagnosis not present

## 2015-08-07 DIAGNOSIS — M25511 Pain in right shoulder: Secondary | ICD-10-CM | POA: Diagnosis not present

## 2015-08-07 DIAGNOSIS — Z9889 Other specified postprocedural states: Secondary | ICD-10-CM | POA: Diagnosis not present

## 2015-08-07 DIAGNOSIS — G8929 Other chronic pain: Secondary | ICD-10-CM | POA: Diagnosis not present

## 2015-08-18 ENCOUNTER — Encounter: Payer: Self-pay | Admitting: Family Medicine

## 2015-08-18 ENCOUNTER — Ambulatory Visit
Admission: RE | Admit: 2015-08-18 | Discharge: 2015-08-18 | Disposition: A | Discharge status: Home / Self Care | Payer: 59 | Source: Ambulatory Visit | Attending: Family Medicine | Admitting: Family Medicine

## 2015-08-18 ENCOUNTER — Ambulatory Visit
Admission: RE | Admit: 2015-08-18 | Discharge: 2015-08-18 | Disposition: A | Discharge status: Home / Self Care | Payer: 59 | Source: Ambulatory Visit | Attending: Surgical Oncology | Admitting: Surgical Oncology

## 2015-08-18 ENCOUNTER — Ambulatory Visit (INDEPENDENT_AMBULATORY_CARE_PROVIDER_SITE_OTHER): Payer: Medicare Other | Admitting: Family Medicine

## 2015-08-18 VITALS — BP 138/81 | HR 96 | Temp 98.2°F | Resp 15 | Ht 67.0 in | Wt 213.3 lb

## 2015-08-18 DIAGNOSIS — J111 Influenza due to unidentified influenza virus with other respiratory manifestations: Secondary | ICD-10-CM | POA: Diagnosis not present

## 2015-08-18 DIAGNOSIS — R509 Fever, unspecified: Secondary | ICD-10-CM | POA: Diagnosis not present

## 2015-08-18 DIAGNOSIS — R05 Cough: Secondary | ICD-10-CM | POA: Diagnosis not present

## 2015-08-18 DIAGNOSIS — R69 Illness, unspecified: Principal | ICD-10-CM

## 2015-08-18 MED ORDER — BENZONATATE 200 MG PO CAPS: 200.0000 mg | ORAL_CAPSULE | Freq: Three times a day (TID) | ORAL | Status: DC | PRN

## 2015-08-18 MED ORDER — AZITHROMYCIN 250 MG PO TABS: ORAL_TABLET | ORAL | Status: DC

## 2015-08-18 NOTE — Progress Notes (Signed)
Name: [name] [name]   MRN: [id number]    DOB: December 05, 1945   Date:08/18/2015       Progress Note  Subjective  Chief Complaint  Chief Complaint  Patient presents with  . Acute Visit    Cough    HPI  Chest Congestion: Dry cough, chest congestion, fever (T max 101.4 last night), chills, body aches. Symptoms present for 5 days. Has taken Tussin CF and got some cough syrup from his wife.   Past Medical History  Diagnosis Date  . Coronary artery disease   . Anginal pain (HCC)   . Back pain     degenerative disc disease  . CHF (congestive heart failure) (HCC)   . Myocardial infarction (HCC)   . GERD (gastroesophageal reflux disease)   . Arthritis   . Diverticulosis   . Neuropathy [facility])     Past Surgical History  Procedure Laterality Date  . Back surgery N/A     lower back x 3  . Neck surgery N/A     neck x 2, one discectomy and one shaved disc.  Marland Kitchen Carpal tunnel release Left 03/2014    In Dr. Rosita Kea office  . Ankle reconstruction Left 1991  . Appendectomy    . Carpal tunnel release Left 10/20/2014    Procedure: CARPAL TUNNEL RELEASE;  Surgeon: Kennedy Bucker, MD;  Location: [facility] ORS;  Service: Orthopedics;  Laterality: Left;  . Shoulder open rotator cuff repair      Left  . Cardiac catheterization    . Eye surgery Bilateral     Cataract Extraction with IOL  . 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 Shoulder arthroscopy Right 06/06/2015    Procedure: right shoulder arthroscopy, arthroscopic debridement, subacromial decompression, SLAP repair, biotenodesis, mini open rotator cuff repair;  Surgeon: Christena Flake, MD;  Location: [facility] ORS;  Service: Orthopedics;  Laterality: Right;    History reviewed. No pertinent family history.  Social History   Social History  . Marital Status: Married    Spouse Name: N/A  . Number of Children: N/A  . Years of Education: N/A   Occupational History  . Not on file.   Social History Main Topics  . Smoking  status: Former Smoker    Quit date: 06/18/2013  . Smokeless tobacco: Never Used  . Alcohol Use: 0.0 - 0.6 oz/week    0-1 Cans of beer per week     Comment: occasionally  . Drug Use: No  . Sexual Activity: Not on file   Other Topics Concern  . Not on file   Social History Narrative     Current outpatient prescriptions:  .  aspirin 81 MG tablet, Take 81 mg by mouth every morning., Disp: , Rfl:  .  azithromycin (ZITHROMAX Z-PAK) 250 MG tablet, 2 tabs po x day 1, then 1 tab po q day x 4 days., Disp: 6 each, Rfl: 0 .  cyclobenzaprine (FLEXERIL) 10 MG tablet, TAKE ONE TABLET BY MOUTH TWICE DAILY AS NEEDED, Disp: 60 tablet, Rfl: 0 .  dicyclomine (BENTYL) 10 MG capsule, Take 1 capsule (10 mg total) by mouth 2 (two) times daily., Disp: 60 capsule, Rfl: 5 .  docusate sodium (COLACE) 100 MG capsule, Take 100 mg by mouth at bedtime., Disp: , Rfl:  .  furosemide (LASIX) 20 MG tablet, Take 1 tablet (20 mg total) by mouth 2 (two) times daily., Disp: 60 tablet, Rfl: 2 .  gabapentin (NEURONTIN) 300 MG capsule,  Take 1 capsule (300 mg total) by mouth 2 (two) times daily., Disp: 60 capsule, Rfl: 2 .  nitroGLYCERIN (NITROSTAT) 0.4 MG SL tablet, Place 1 tablet (0.4 mg total) under the tongue every 5 (five) minutes as needed for chest pain., Disp: 30 tablet, Rfl: 0 .  omeprazole (PRILOSEC) 40 MG capsule, Take 1 capsule (40 mg total) by mouth daily. (Patient taking differently: Take 40 mg by mouth at bedtime. ), Disp: 30 capsule, Rfl: 3 .  Oxycodone HCl 10 MG TABS, Take 1 tablet (10 mg total) by mouth every 12 (twelve) hours as needed., Disp: 60 tablet, Rfl: 0 .  potassium chloride SA (KLOR-CON M20) 20 MEQ tablet, Take 1 tablet (20 mEq total) by mouth daily., Disp: 30 tablet, Rfl: 5 .  vitamin E (VITAMIN E) 400 UNIT capsule, Take 400 Units by mouth daily., Disp: , Rfl:   Allergies  Allergen Reactions  . Parafon Forte Dsc [Chlorzoxazone] Rash and Itching    severe  . Cucumber Extract Other (See Comments)     GI upset     Review of Systems  Constitutional: Positive for fever, chills and malaise/fatigue.  HENT: Positive for congestion and sore throat.   Respiratory: Positive for cough.   Cardiovascular: Negative for chest pain.      Objective  Filed Vitals:   08/18/15 0811  BP: 138/81  Pulse: 96  Temp: 98.2 F (36.8 C)  TempSrc: Oral  Resp: 15  Height:  (1.702 m)  Weight: 213 lb 4.8 oz (96.752 kg)  SpO2: 96%    Physical Exam  Constitutional: He is well-developed, well-nourished, and in no distress.  HENT:  Mouth/Throat: Mucous membranes are dry. No posterior oropharyngeal erythema.  Cardiovascular: Normal rate and regular rhythm.   Pulmonary/Chest: He has no wheezes. He has rhonchi in the right lower field and the left lower field.  Nursing note and vitals reviewed.    Assessment & Plan  1. Influenza-like illness We will start on Z-Pak for relief of cough, obtain chest x-ray for evaluation of crackles in lower lung fields. - azithromycin (ZITHROMAX Z-PAK) 250 MG tablet; 2 tabs po x day 1, then 1 tab po q day x 4 days.  Dispense: 6 each; Refill: 0 - benzonatate (TESSALON) 200 MG capsule; Take 1 capsule (200 mg total) by mouth 3 (three) times daily as needed for cough.  Dispense: 20 capsule; Refill: 0 - DG Chest 2 View; Future     Terri Rorrer [facility] 08/18/2015 8:26 AM

## 2015-08-21 DIAGNOSIS — Z9889 Other specified postprocedural states: Secondary | ICD-10-CM | POA: Diagnosis not present

## 2015-08-26 ENCOUNTER — Other Ambulatory Visit: Payer: Self-pay | Admitting: Family Medicine

## 2015-09-01 ENCOUNTER — Other Ambulatory Visit: Payer: Self-pay | Admitting: Family Medicine

## 2015-09-01 DIAGNOSIS — G8929 Other chronic pain: Secondary | ICD-10-CM

## 2015-09-01 DIAGNOSIS — M5441 Lumbago with sciatica, right side: Principal | ICD-10-CM

## 2015-09-01 MED ORDER — OXYCODONE HCL 10 MG PO TABS: 10.0000 mg | ORAL_TABLET | Freq: Two times a day (BID) | ORAL | Status: DC | PRN

## 2015-09-01 NOTE — Telephone Encounter (Signed)
Routed to Dr. Shah for refill approval 

## 2015-09-01 NOTE — Telephone Encounter (Signed)
PT IS NEEDING RX FOR HIS PAIN MEDICATION OXYCOD

## 2015-09-04 DIAGNOSIS — Z9889 Other specified postprocedural states: Secondary | ICD-10-CM | POA: Diagnosis not present

## 2015-10-08 ENCOUNTER — Other Ambulatory Visit: Payer: Self-pay | Admitting: Family Medicine

## 2015-10-11 ENCOUNTER — Encounter: Payer: Self-pay | Admitting: Family Medicine

## 2015-10-11 ENCOUNTER — Ambulatory Visit (INDEPENDENT_AMBULATORY_CARE_PROVIDER_SITE_OTHER): Payer: 59 | Admitting: Family Medicine

## 2015-10-11 VITALS — BP 141/77 | HR 70 | Temp 98.0°F | Resp 15 | Ht 67.0 in | Wt 213.5 lb

## 2015-10-11 DIAGNOSIS — R12 Heartburn: Secondary | ICD-10-CM

## 2015-10-11 DIAGNOSIS — M5441 Lumbago with sciatica, right side: Secondary | ICD-10-CM

## 2015-10-11 DIAGNOSIS — G8929 Other chronic pain: Secondary | ICD-10-CM

## 2015-10-11 MED ORDER — GABAPENTIN 300 MG PO CAPS: 300.0000 mg | ORAL_CAPSULE | Freq: Two times a day (BID) | ORAL | Status: DC

## 2015-10-11 MED ORDER — PANTOPRAZOLE SODIUM 40 MG PO TBEC: 40.0000 mg | DELAYED_RELEASE_TABLET | Freq: Every morning | ORAL | Status: DC

## 2015-10-11 MED ORDER — OXYCODONE HCL 10 MG PO TABS: 10.0000 mg | ORAL_TABLET | Freq: Two times a day (BID) | ORAL | Status: DC | PRN

## 2015-10-11 MED ORDER — CYCLOBENZAPRINE HCL 10 MG PO TABS: 10.0000 mg | ORAL_TABLET | Freq: Two times a day (BID) | ORAL | Status: DC | PRN

## 2015-10-11 NOTE — Progress Notes (Signed)
Name: [name] [name]   MRN: [id number]    DOB: 12/04/1945   Date:10/11/2015       Progress Note  Subjective  Chief Complaint  Chief Complaint  Patient presents with  . Acute Visit    Food not digesting well    HPI  Pt. Presents for evaluation of frequent heartburn , bloating, and gas. He reports feeling as if the food that he eats is not moving properly through his digestive tract, he feels full soon after eating, has frequent belching, and gas.  He is on Omeprazole 40 mg daily which is not helping relieve his symptoms.  Patient also requesting refills for oxycodone, gabapentin, and cyclobenzaprine. Has history of chronic low back pain, takes the above-mentioned medications to help relieve the back pain,   Past Medical History  Diagnosis Date  . Coronary artery disease   . Anginal pain (HCC)   . Back pain     degenerative disc disease  . CHF (congestive heart failure) (HCC)   . Myocardial infarction (HCC)   . GERD (gastroesophageal reflux disease)   . Arthritis   . Diverticulosis   . Neuropathy [facility])     Past Surgical History  Procedure Laterality Date  . Back surgery N/A     lower back x 3  . Neck surgery N/A     neck x 2, one discectomy and one shaved disc.  Marland Kitchen Carpal tunnel release Left 03/2014    In Dr. Rosita Kea office  . Ankle reconstruction Left 1991  . Appendectomy    . Carpal tunnel release Left 10/20/2014    Procedure: CARPAL TUNNEL RELEASE;  Surgeon: Kennedy Bucker, MD;  Location: [facility] ORS;  Service: Orthopedics;  Laterality: Left;  . Shoulder open rotator cuff repair      Left  . Cardiac catheterization    . Eye surgery Bilateral     Cataract Extraction with IOL  . 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 Shoulder arthroscopy Right 06/06/2015    Procedure: right shoulder arthroscopy, arthroscopic debridement, subacromial decompression, SLAP repair, biotenodesis, mini open rotator cuff repair;  Surgeon: Christena Flake, MD;  Location: [facility]  ORS;  Service: Orthopedics;  Laterality: Right;    History reviewed. No pertinent family history.  Social History   Social History  . Marital Status: Married    Spouse Name: N/A  . Number of Children: N/A  . Years of Education: N/A   Occupational History  . Not on file.   Social History Main Topics  . Smoking status: Former Smoker    Quit date: 06/18/2013  . Smokeless tobacco: Never Used  . Alcohol Use: 0.0 - 0.6 oz/week    0-1 Cans of beer per week     Comment: occasionally  . Drug Use: No  . Sexual Activity: Not on file   Other Topics Concern  . Not on file   Social History Narrative     Current outpatient prescriptions:  .  aspirin 81 MG tablet, Take 81 mg by mouth every morning., Disp: , Rfl:  .  cyclobenzaprine (FLEXERIL) 10 MG tablet, TAKE ONE TABLET BY MOUTH TWICE DAILY AS NEEDED, Disp: 60 tablet, Rfl: 0 .  dicyclomine (BENTYL) 10 MG capsule, Take 1 capsule (10 mg total) by mouth 2 (two) times daily., Disp: 60 capsule, Rfl: 5 .  docusate sodium (COLACE) 100 MG capsule, Take 100 mg by mouth at bedtime., Disp: , Rfl:  .  furosemide (LASIX) 20 MG  tablet, Take 1 tablet (20 mg total) by mouth 2 (two) times daily., Disp: 60 tablet, Rfl: 2 .  gabapentin (NEURONTIN) 300 MG capsule, TAKE ONE CAPSULE BY MOUTH TWICE DAILY, Disp: 60 capsule, Rfl: 0 .  nitroGLYCERIN (NITROSTAT) 0.4 MG SL tablet, Place 1 tablet (0.4 mg total) under the tongue every 5 (five) minutes as needed for chest pain., Disp: 30 tablet, Rfl: 0 .  omeprazole (PRILOSEC) 40 MG capsule, TAKE ONE CAPSULE BY MOUTH ONCE DAILY, Disp: 30 capsule, Rfl: 0 .  Oxycodone HCl 10 MG TABS, Take 1 tablet (10 mg total) by mouth every 12 (twelve) hours as needed., Disp: 60 tablet, Rfl: 0 .  potassium chloride SA (KLOR-CON M20) 20 MEQ tablet, Take 1 tablet (20 mEq total) by mouth daily., Disp: 30 tablet, Rfl: 5 .  vitamin E (VITAMIN E) 400 UNIT capsule, Take 400 Units by mouth daily., Disp: , Rfl:  .  azithromycin (ZITHROMAX  Z-PAK) 250 MG tablet, 2 tabs po x day 1, then 1 tab po q day x 4 days. (Patient not taking: Reported on 10/11/2015), Disp: 6 each, Rfl: 0 .  benzonatate (TESSALON) 200 MG capsule, Take 1 capsule (200 mg total) by mouth 3 (three) times daily as needed for cough. (Patient not taking: Reported on 10/11/2015), Disp: 20 capsule, Rfl: 0  Allergies  Allergen Reactions  . Parafon Forte Dsc [Chlorzoxazone] Rash and Itching    severe  . Cucumber Extract Other (See Comments)    GI upset     Review of Systems  Constitutional: Negative for fever and chills.  Cardiovascular: Negative for chest pain.  Gastrointestinal: Positive for heartburn. Negative for diarrhea, constipation and melena.    Objective  Filed Vitals:   10/11/15 1117  BP: 141/77  Pulse: 70  Temp: 98 F (36.7 C)  TempSrc: Oral  Resp: 15  Height:  (1.702 m)  Weight: 213 lb 8 oz (96.843 kg)  SpO2: 95%    Physical Exam  Constitutional: He is well-developed, well-nourished, and in no distress.  Cardiovascular: Normal rate and regular rhythm.   Pulmonary/Chest: Effort normal and breath sounds normal.  Abdominal: Soft. Bowel sounds are normal. There is no tenderness.  Nursing note and vitals reviewed.     Assessment & Plan  1. Heartburn GI symptoms appear to be related to heartburn, we will change to a stronger PPI and referral to gastroenterology for consideration of endoscopy given that he has history of benign esophageal tumors. - pantoprazole (PROTONIX) 40 MG tablet; Take 1 tablet (40 mg total) by mouth every morning.  Dispense: 30 tablet; Refill: 0 - Ambulatory referral to Gastroenterology  2. Chronic right-sided low back pain with right-sided sciatica Responsive to opioid and muscle relaxant therapy refills provided and follow-up in one month. - Oxycodone HCl 10 MG TABS; Take 1 tablet (10 mg total) by mouth every 12 (twelve) hours as needed.  Dispense: 60 tablet; Refill: 0 - gabapentin (NEURONTIN) 300 MG  capsule; Take 1 capsule (300 mg total) by mouth 2 (two) times daily.  Dispense: 180 capsule; Refill: 0 - cyclobenzaprine (FLEXERIL) 10 MG tablet; Take 1 tablet (10 mg total) by mouth 2 (two) times daily as needed.  Dispense: 180 tablet; Refill: 0  Kyree Fedorko [facility]ad [facility] 10/11/2015 11:28 AM

## 2015-10-16 ENCOUNTER — Telehealth: Payer: Self-pay | Admitting: Family Medicine

## 2015-10-16 NOTE — Telephone Encounter (Signed)
PT WIFE CALLED AND SAID THAT HER HUSBAND LOOKS LIKE HE HAS THE PINE EYE. 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 IT FEELS LIKE SOMETHING IS IN IT AND IT IS RED, HAS SOME LIQUID IN IT. YOU DO NOT HAVE ANY APPTS TILL Thursday SO WANTS TO KNOW IF YOU WOULD CALL IN SOMETHING TO [facility] ON [address].

## 2015-10-16 NOTE — Telephone Encounter (Signed)
He should be seen by an eye doctor or at an urgent care.

## 2015-10-17 ENCOUNTER — Telehealth: Payer: Self-pay | Admitting: Gastroenterology

## 2015-10-17 ENCOUNTER — Telehealth: Payer: Self-pay | Admitting: Family Medicine

## 2015-10-17 DIAGNOSIS — I2510 Atherosclerotic heart disease of native coronary artery without angina pectoris: Secondary | ICD-10-CM

## 2015-10-17 MED ORDER — NITROGLYCERIN 0.4 MG SL SUBL: 0.4000 mg | SUBLINGUAL_TABLET | SUBLINGUAL | Status: DC | PRN

## 2015-10-17 NOTE — Telephone Encounter (Signed)
Patient needs to set up appointment for an EGD. Referred by Dr. Sherryll Burger. Call his wife, Okey Dupre, @ [phone]

## 2015-10-17 NOTE — Telephone Encounter (Signed)
Pt informed

## 2015-10-17 NOTE — Telephone Encounter (Signed)
Prescription for sublingual nitroglycerin has been sent to his pharmacy

## 2015-10-17 NOTE — Telephone Encounter (Signed)
Pt needs a refill on Nitro that goes under tongue. Wife could not remember the name. Wife states his got washed in the washer. Pts pharmacy is [facility] [address]

## 2015-10-18 ENCOUNTER — Other Ambulatory Visit: Payer: Self-pay

## 2015-10-18 NOTE — Telephone Encounter (Signed)
Pt scheduled for EGD at [facility] on 10/27/15. Heartburn R12. Please precert.

## 2015-10-19 ENCOUNTER — Encounter: Payer: Self-pay | Admitting: *Deleted

## 2015-10-25 NOTE — Discharge Instructions (Signed)

## 2015-10-26 ENCOUNTER — Encounter
Admission: RE | Disposition: A | Discharge status: Home / Self Care | Payer: Self-pay | Source: Ambulatory Visit | Attending: Gastroenterology

## 2015-10-26 ENCOUNTER — Ambulatory Visit: Payer: 59 | Admitting: Anesthesiology

## 2015-10-26 ENCOUNTER — Ambulatory Visit
Admission: RE | Admit: 2015-10-26 | Discharge: 2015-10-26 | Disposition: A | Discharge status: Home / Self Care | Payer: 59 | Source: Ambulatory Visit | Attending: Gastroenterology | Admitting: Gastroenterology

## 2015-10-26 DIAGNOSIS — Z7982 Long term (current) use of aspirin: Secondary | ICD-10-CM | POA: Insufficient documentation

## 2015-10-26 DIAGNOSIS — I509 Heart failure, unspecified: Secondary | ICD-10-CM | POA: Insufficient documentation

## 2015-10-26 DIAGNOSIS — Z951 Presence of aortocoronary bypass graft: Secondary | ICD-10-CM | POA: Insufficient documentation

## 2015-10-26 DIAGNOSIS — K2970 Gastritis, unspecified, without bleeding: Secondary | ICD-10-CM

## 2015-10-26 DIAGNOSIS — K449 Diaphragmatic hernia without obstruction or gangrene: Secondary | ICD-10-CM | POA: Diagnosis not present

## 2015-10-26 DIAGNOSIS — Z888 Allergy status to other drugs, medicaments and biological substances status: Secondary | ICD-10-CM | POA: Diagnosis not present

## 2015-10-26 DIAGNOSIS — I252 Old myocardial infarction: Secondary | ICD-10-CM | POA: Insufficient documentation

## 2015-10-26 DIAGNOSIS — M1990 Unspecified osteoarthritis, unspecified site: Secondary | ICD-10-CM | POA: Insufficient documentation

## 2015-10-26 DIAGNOSIS — Z79899 Other long term (current) drug therapy: Secondary | ICD-10-CM | POA: Diagnosis not present

## 2015-10-26 DIAGNOSIS — I10 Essential (primary) hypertension: Secondary | ICD-10-CM | POA: Insufficient documentation

## 2015-10-26 DIAGNOSIS — K219 Gastro-esophageal reflux disease without esophagitis: Secondary | ICD-10-CM | POA: Diagnosis not present

## 2015-10-26 DIAGNOSIS — Z91018 Allergy to other foods: Secondary | ICD-10-CM | POA: Insufficient documentation

## 2015-10-26 DIAGNOSIS — Z87891 Personal history of nicotine dependence: Secondary | ICD-10-CM | POA: Insufficient documentation

## 2015-10-26 DIAGNOSIS — R1310 Dysphagia, unspecified: Secondary | ICD-10-CM | POA: Diagnosis not present

## 2015-10-26 DIAGNOSIS — K2930 Chronic superficial gastritis without bleeding: Secondary | ICD-10-CM | POA: Diagnosis not present

## 2015-10-26 DIAGNOSIS — I2510 Atherosclerotic heart disease of native coronary artery without angina pectoris: Secondary | ICD-10-CM | POA: Diagnosis not present

## 2015-10-26 HISTORY — DX: Presence of dental prosthetic device (complete) (partial): Z97.2

## 2015-10-26 HISTORY — DX: Presence of external hearing-aid: Z97.4

## 2015-10-26 HISTORY — PX: ESOPHAGOGASTRODUODENOSCOPY (EGD) WITH PROPOFOL: SHX5813

## 2015-10-26 HISTORY — DX: Carpal tunnel syndrome, right upper limb: G56.01

## 2015-10-26 SURGERY — ESOPHAGOGASTRODUODENOSCOPY (EGD) WITH PROPOFOL
Anesthesia: Monitor Anesthesia Care | Wound class: Clean Contaminated

## 2015-10-26 MED ORDER — LACTATED RINGERS IV SOLN
INTRAVENOUS | Status: DC
  Administered 2015-10-26: 10:00:00 via INTRAVENOUS

## 2015-10-26 MED ORDER — LIDOCAINE HCL (CARDIAC) 20 MG/ML IV SOLN
INTRAVENOUS | Status: DC | PRN
  Administered 2015-10-26: 40 mg via INTRAVENOUS

## 2015-10-26 MED ORDER — PROPOFOL 10 MG/ML IV BOLUS
INTRAVENOUS | Status: DC | PRN
  Administered 2015-10-26 (×3): 20 mg via INTRAVENOUS
  Administered 2015-10-26: 30 mg via INTRAVENOUS
  Administered 2015-10-26: 50 mg via INTRAVENOUS

## 2015-10-26 MED ORDER — ACETAMINOPHEN 325 MG PO TABS: 325.0000 mg | ORAL_TABLET | ORAL | Status: DC | PRN

## 2015-10-26 MED ORDER — ACETAMINOPHEN 160 MG/5ML PO SOLN: 325.0000 mg | ORAL | Status: DC | PRN

## 2015-10-26 MED ORDER — STERILE WATER FOR IRRIGATION IR SOLN
Status: DC | PRN
  Administered 2015-10-26: 11:00:00

## 2015-10-26 MED ORDER — GLYCOPYRROLATE 0.2 MG/ML IJ SOLN
INTRAMUSCULAR | Status: DC | PRN
  Administered 2015-10-26: 0.1 mg via INTRAVENOUS

## 2015-10-26 MED ORDER — ONDANSETRON HCL 4 MG/2ML IJ SOLN: 4.0000 mg | Freq: Once | INTRAMUSCULAR | Status: DC | PRN

## 2015-10-26 SURGICAL SUPPLY — 31 items
BALLN DILATOR 10-12 8 (BALLOONS)
BALLN DILATOR 12-15 8 (BALLOONS)
BALLN DILATOR 15-18 8 (BALLOONS)
BALLN DILATOR CRE 0-12 8 (BALLOONS)
BALLN DILATOR ESOPH 8 10 CRE (MISCELLANEOUS) IMPLANT
BALLOON DILATOR 12-15 8 (BALLOONS) IMPLANT
BALLOON DILATOR 15-18 8 (BALLOONS) IMPLANT
BALLOON DILATOR CRE 0-12 8 (BALLOONS) IMPLANT
BLOCK BITE 60FR ADLT L/F GRN (MISCELLANEOUS) ×3 IMPLANT
CANISTER SUCT 1200ML W/VALVE (MISCELLANEOUS) ×3 IMPLANT
CLIP HMST 235XBRD CATH ROT (MISCELLANEOUS) IMPLANT
CLIP RESOLUTION 360 11X235 (MISCELLANEOUS)
FCP ESCP3.2XJMB 240X2.8X (MISCELLANEOUS)
FORCEPS BIOP RAD 4 LRG CAP 4 (CUTTING FORCEPS) IMPLANT
FORCEPS BIOP RJ4 240 W/NDL (MISCELLANEOUS)
FORCEPS ESCP3.2XJMB 240X2.8X (MISCELLANEOUS) IMPLANT
GOWN CVR UNV OPN BCK APRN NK (MISCELLANEOUS) ×2 IMPLANT
GOWN ISOL THUMB LOOP REG UNIV (MISCELLANEOUS) ×4
INJECTOR VARIJECT VIN23 (MISCELLANEOUS) IMPLANT
KIT DEFENDO VALVE AND CONN (KITS) IMPLANT
KIT ENDO PROCEDURE OLY (KITS) ×3 IMPLANT
MARKER SPOT ENDO TATTOO 5ML (MISCELLANEOUS) IMPLANT
PAD GROUND ADULT SPLIT (MISCELLANEOUS) IMPLANT
SNARE SHORT THROW 13M SML OVAL (MISCELLANEOUS) IMPLANT
SNARE SHORT THROW 30M LRG OVAL (MISCELLANEOUS) IMPLANT
SPOT EX ENDOSCOPIC TATTOO (MISCELLANEOUS)
SYR INFLATION 60ML (SYRINGE) IMPLANT
TRAP ETRAP POLY (MISCELLANEOUS) IMPLANT
VARIJECT INJECTOR VIN23 (MISCELLANEOUS)
WATER STERILE IRR 250ML POUR (IV SOLUTION) ×3 IMPLANT
WIRE CRE 18-20MM 8CM F G (MISCELLANEOUS) IMPLANT

## 2015-10-26 NOTE — Op Note (Signed)
[facility] Gastroenterology Patient Name: [name] [name] Procedure Date: 10/26/2015 10:47 AM MRN: [id number] Account #: [id number] Date of Birth: 03/10/1946 Admit Type: Outpatient Age: 70 Room: [facility] OR ROOM 01 Gender: Male Note Status: Finalized Procedure:            Upper GI endoscopy Indications:          Dysphagia Providers:            [name], MD Referring MD:         Shelbie Ammons. Sherryll Burger (Referring MD) Medicines:            Propofol per Anesthesia Complications:        No immediate complications. Procedure:            Pre-Anesthesia Assessment:                       - Prior to the procedure, a History and Physical was                        performed, and patient medications and allergies were                        reviewed. The patient's tolerance of previous                        anesthesia was also reviewed. The risks and benefits of                        the procedure and the sedation options and risks were                        discussed with the patient. All questions were                        answered, and informed consent was obtained. Prior                        Anticoagulants: The patient has taken no previous                        anticoagulant or antiplatelet agents. ASA Grade                        Assessment: II - A patient with mild systemic disease.                        After reviewing the risks and benefits, the patient was                        deemed in satisfactory condition to undergo the                        procedure.                       After obtaining informed consent, the endoscope was                        passed under direct vision. Throughout the procedure,  the patient's blood pressure, pulse, and oxygen                        saturations were monitored continuously. The Olympus                        GIF H180J colonscope (T#:[id number]) was introduced                        through the mouth,  and advanced to the second part of                        duodenum. The upper GI endoscopy was accomplished                        without difficulty. The patient tolerated the procedure                        well. Findings:      A small hiatal hernia was present.      The scope was withdrawn. Dilation was performed in the entire esophagus       with a Maloney dilator with no resistance at 52 Fr.      Localized mild inflammation characterized by erythema was found in the       gastric antrum. Biopsies were taken with a cold forceps for histology.      The examined duodenum was normal. Impression:           - Small hiatal hernia.                       - Gastritis. Biopsied.                       - Normal examined duodenum.                       - Dilation performed in the entire esophagus. Recommendation:       - Await pathology results. Procedure Code(s):    --- Professional ---                       [phone], Esophagogastroduodenoscopy, flexible, transoral;                        with biopsy, single or multiple                       43450, Dilation of esophagus, by unguided sound or                        bougie, single or multiple passes Diagnosis Code(s):    --- Professional ---                       R13.10, Dysphagia, unspecified                       K44.9, Diaphragmatic hernia without obstruction or                        gangrene  K29.70, Gastritis, unspecified, without bleeding CPT copyright 2016 American Medical Association. All rights reserved. The codes documented in this report are preliminary and upon coder review may  be revised to meet current compliance requirements. [name], MD 10/26/2015 11:05:14 AM This report has been signed electronically. Number of Addenda: 0 Note Initiated On: 10/26/2015 10:47 AM      [facility]

## 2015-10-26 NOTE — Anesthesia Procedure Notes (Signed)
Procedure Name: MAC Performed by: Donnella Morford Pre-anesthesia Checklist: Patient identified, Emergency Drugs available, Suction available, Timeout performed and Patient being monitored Patient Re-evaluated:Patient Re-evaluated prior to inductionOxygen Delivery Method: Nasal cannula Placement Confirmation: positive ETCO2       

## 2015-10-26 NOTE — Anesthesia Preprocedure Evaluation (Addendum)
Anesthesia Evaluation  Patient identified by MRN, date of birth, ID band Patient awake    Reviewed: Allergy & Precautions, NPO status , Patient's Chart, lab work & pertinent test results  Airway Mallampati: II  TM Distance: >3 FB Neck ROM: Full    Dental  (+) Upper Dentures, Lower Dentures   Pulmonary former smoker,    Pulmonary exam normal        Cardiovascular hypertension, Pt. on medications + angina + CAD, + Past MI, + CABG and +CHF  Normal cardiovascular exam     Neuro/Psych  Neuromuscular disease negative psych ROS   GI/Hepatic Neg liver ROS, GERD  Medicated and Controlled,  Endo/Other  negative endocrine ROS  Renal/GU negative Renal ROS     Musculoskeletal  (+) Arthritis , Osteoarthritis,    Abdominal   Peds  Hematology   Anesthesia Other Findings   Reproductive/Obstetrics                           Anesthesia Physical Anesthesia Plan  ASA: III  Anesthesia Plan: MAC   Post-op Pain Management:    Induction: Intravenous  Airway Management Planned:   Additional Equipment:   Intra-op Plan:   Post-operative Plan:   Informed Consent: I have reviewed the patients History and Physical, chart, labs and discussed the procedure including the risks, benefits and alternatives for the proposed anesthesia with the patient or authorized representative who has indicated his/her understanding and acceptance.     Plan Discussed with: CRNA  Anesthesia Plan Comments:         Anesthesia Quick Evaluation

## 2015-10-26 NOTE — Transfer of Care (Signed)
Immediate Anesthesia Transfer of Care Note  Patient: [name] [name]  Procedure(s) Performed: Procedure(s): ESOPHAGOGASTRODUODENOSCOPY (EGD) WITH PROPOFOL with dialation (N/A)  Patient Location: PACU  Anesthesia Type: MAC  Level of Consciousness: awake, alert  and patient cooperative  Airway and Oxygen Therapy: Patient Spontanous Breathing and Patient connected to supplemental oxygen  Post-op Assessment: Post-op Vital signs reviewed, Patient's Cardiovascular Status Stable, Respiratory Function Stable, Patent Airway and No signs of Nausea or vomiting  Post-op Vital Signs: Reviewed and stable  Complications: No apparent anesthesia complications

## 2015-10-26 NOTE — Anesthesia Postprocedure Evaluation (Signed)
Anesthesia Post Note  Patient: [name] [name]  Procedure(s) Performed: Procedure(s) (LRB): ESOPHAGOGASTRODUODENOSCOPY (EGD) WITH PROPOFOL with dialation (N/A)  Patient location during evaluation: PACU Anesthesia Type: MAC Level of consciousness: awake and alert and oriented Pain management: pain level controlled Vital Signs Assessment: post-procedure vital signs reviewed and stable Respiratory status: spontaneous breathing and nonlabored ventilation Cardiovascular status: stable Postop Assessment: no signs of nausea or vomiting and adequate PO intake Anesthetic complications: no    Harolyn Rutherford

## 2015-10-26 NOTE — H&P (Signed)
[facility]  [address], [address] [place], [place] [address] Phone: [phone] Fax : [phone]  Primary Care Physician:  Brayton El, MD Primary Gastroenterologist:  Dr. Servando Snare  Pre-Procedure History & Physical: HPI:  [name] [name] is a 70 y.o. male is here for an endoscopy.   Past Medical History  Diagnosis Date  . Coronary artery disease   . Anginal pain (HCC)   . Back pain     degenerative disc disease  . CHF (congestive heart failure) (HCC)   . Myocardial infarction (HCC)   . GERD (gastroesophageal reflux disease)   . Diverticulosis   . Neuropathy (HCC)   . Carpal tunnel syndrome of right wrist   . Wears hearing aid     bilateral  . Wears dentures     full upper and lower  . Arthritis     Past Surgical History  Procedure Laterality Date  . Back surgery N/A     lower back x 3  . Neck surgery N/A     neck x 2, one discectomy and one shaved disc.  Marland Kitchen Carpal tunnel release Left 03/2014    In Dr. Rosita Kea office  . Ankle reconstruction Left 1991  . Appendectomy    . Carpal tunnel release Left 10/20/2014    Procedure: CARPAL TUNNEL RELEASE;  Surgeon: Kennedy Bucker, MD;  Location: [facility];  Service: Orthopedics;  Laterality: Left;  . Shoulder open rotator cuff repair      Left  . Cardiac catheterization    . Eye surgery Bilateral     Cataract Extraction with IOL  . 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 Shoulder arthroscopy Right 06/06/2015    Procedure: right shoulder arthroscopy, arthroscopic debridement, subacromial decompression, SLAP repair, biotenodesis, mini open rotator cuff repair;  Surgeon: Christena Flake, MD;  Location: [facility];  Service: Orthopedics;  Laterality: Right;    Prior to Admission medications   Medication Sig Start Date End Date Taking? Authorizing Provider  aspirin 81 MG tablet Take 81 mg by mouth every morning.   Yes Historical Provider, MD  cyclobenzaprine (FLEXERIL) 10 MG tablet Take 1 tablet (10  mg total) by mouth 2 (two) times daily as needed. 10/11/15  Yes Ellyn Hack, MD  dicyclomine (BENTYL) 10 MG capsule Take 1 capsule (10 mg total) by mouth 2 (two) times daily. 06/13/15  Yes Ellyn Hack, MD  docusate sodium (COLACE) 100 MG capsule Take 100 mg by mouth at bedtime.   Yes Historical Provider, MD  furosemide (LASIX) 20 MG tablet Take 1 tablet (20 mg total) by mouth 2 (two) times daily. 07/12/15  Yes Ellyn Hack, MD  gabapentin (NEURONTIN) 300 MG capsule Take 1 capsule (300 mg total) by mouth 2 (two) times daily. 10/11/15  Yes Ellyn Hack, MD  nitroGLYCERIN (NITROSTAT) 0.4 MG SL tablet Place 1 tablet (0.4 mg total) under the tongue every 5 (five) minutes as needed for chest pain. Max 3 doses within 15 mins 10/17/15  Yes Ellyn Hack, MD  Oxycodone HCl 10 MG TABS Take 1 tablet (10 mg total) by mouth every 12 (twelve) hours as needed. 10/11/15  Yes Ellyn Hack, MD  pantoprazole (PROTONIX) 40 MG tablet Take 1 tablet (40 mg total) by mouth every morning. 10/11/15  Yes Ellyn Hack, MD  potassium chloride SA (KLOR-CON M20) 20 MEQ tablet Take 1 tablet (20 mEq total) by mouth  daily. 06/13/15  Yes Ellyn Hack, MD  vitamin E (VITAMIN E) 400 UNIT capsule Take 400 Units by mouth daily.   Yes Historical Provider, MD    Allergies as of 10/18/2015 - Review Complete 10/11/2015  Allergen Reaction Noted  . Parafon forte dsc [chlorzoxazone] Rash and Itching 10/20/2014  . Cucumber extract Other (See Comments) 10/17/2014    History reviewed. No pertinent family history.  Social History   Social History  . Marital Status: Married    Spouse Name: N/A  . Number of Children: N/A  . Years of Education: N/A   Occupational History  . Not on file.   Social History Main Topics  . Smoking status: Former Smoker    Quit date: 06/18/2013  . Smokeless tobacco: Never Used  . Alcohol Use: 0.0 - 0.6 oz/week    0-1 Cans of beer per week     Comment: occasionally  . Drug Use: No   . Sexual Activity: Not on file   Other Topics Concern  . Not on file   Social History Narrative    Review of Systems: See HPI, otherwise negative ROS  Physical Exam: BP 116/69 mmHg  Pulse 56  Temp(Src) 98.4 F (36.9 C) (Temporal)  Ht 5\' 7"  (1.702 m)  Wt 212 lb (96.163 kg)  BMI 33.20 kg/m2  SpO2 97% General:   Alert,  pleasant and cooperative in NAD Head:  Normocephalic and atraumatic. Neck:  Supple; no masses or thyromegaly. Lungs:  Clear throughout to auscultation.    Heart:  Regular rate and rhythm. Abdomen:  Soft, nontender and nondistended. Normal bowel sounds, without guarding, and without rebound.   Neurologic:  Alert and  oriented x4;  grossly normal neurologically.  Impression/Plan: [name] is here for an endoscopy to be performed for GERD  Risks, benefits, limitations, and alternatives regarding  endoscopy have been reviewed with the patient.  Questions have been answered.  All parties agreeable.   [name], MD  10/26/2015, 9:48 AM

## 2015-11-02 ENCOUNTER — Encounter: Payer: Self-pay | Admitting: Gastroenterology

## 2015-11-06 ENCOUNTER — Other Ambulatory Visit: Payer: Self-pay | Admitting: Family Medicine

## 2015-11-06 ENCOUNTER — Telehealth: Payer: Self-pay | Admitting: Gastroenterology

## 2015-11-06 NOTE — Telephone Encounter (Signed)
Patients wife called back and asked that if she doesn't answer please leave a voice message with results

## 2015-11-06 NOTE — Telephone Encounter (Signed)
Left vm for pt to return my call.  

## 2015-11-06 NOTE — Telephone Encounter (Signed)
Patient wife, Okey Dupre, called regarding results from his endoscopy

## 2015-11-07 ENCOUNTER — Other Ambulatory Visit: Payer: Self-pay | Admitting: Family Medicine

## 2015-11-07 NOTE — Telephone Encounter (Signed)
Left vm with results per pt's wife request. Advised to contact me with any questions.

## 2015-12-16 ENCOUNTER — Other Ambulatory Visit: Payer: Self-pay | Admitting: Family Medicine

## 2015-12-28 ENCOUNTER — Telehealth: Payer: Self-pay | Admitting: Family Medicine

## 2015-12-28 DIAGNOSIS — G8929 Other chronic pain: Secondary | ICD-10-CM

## 2015-12-28 DIAGNOSIS — M5441 Lumbago with sciatica, right side: Principal | ICD-10-CM

## 2015-12-28 MED ORDER — OXYCODONE HCL 10 MG PO TABS: 10.0000 mg | ORAL_TABLET | Freq: Two times a day (BID) | ORAL | 0 refills | Status: DC | PRN

## 2015-12-28 NOTE — Telephone Encounter (Signed)
Prescription for oxycodone is ready for pickup 

## 2016-01-11 ENCOUNTER — Ambulatory Visit (INDEPENDENT_AMBULATORY_CARE_PROVIDER_SITE_OTHER): Payer: 59 | Admitting: Family Medicine

## 2016-01-11 ENCOUNTER — Encounter: Payer: Self-pay | Admitting: Family Medicine

## 2016-01-11 VITALS — BP 120/72 | HR 60 | Temp 97.7°F | Resp 16 | Ht 67.0 in | Wt 212.8 lb

## 2016-01-11 DIAGNOSIS — Z8719 Personal history of other diseases of the digestive system: Secondary | ICD-10-CM

## 2016-01-11 DIAGNOSIS — M7989 Other specified soft tissue disorders: Secondary | ICD-10-CM

## 2016-01-11 DIAGNOSIS — Z86018 Personal history of other benign neoplasm: Secondary | ICD-10-CM

## 2016-01-11 DIAGNOSIS — G8929 Other chronic pain: Secondary | ICD-10-CM | POA: Diagnosis not present

## 2016-01-11 DIAGNOSIS — K219 Gastro-esophageal reflux disease without esophagitis: Secondary | ICD-10-CM | POA: Diagnosis not present

## 2016-01-11 DIAGNOSIS — M5441 Lumbago with sciatica, right side: Secondary | ICD-10-CM | POA: Diagnosis not present

## 2016-01-11 MED ORDER — PANTOPRAZOLE SODIUM 40 MG PO TBEC: 40.0000 mg | DELAYED_RELEASE_TABLET | Freq: Every morning | ORAL | 0 refills | Status: DC

## 2016-01-11 MED ORDER — GABAPENTIN 300 MG PO CAPS: 300.0000 mg | ORAL_CAPSULE | Freq: Two times a day (BID) | ORAL | 0 refills | Status: DC

## 2016-01-11 MED ORDER — FUROSEMIDE 20 MG PO TABS: 20.0000 mg | ORAL_TABLET | Freq: Two times a day (BID) | ORAL | 0 refills | Status: DC

## 2016-01-11 MED ORDER — CYCLOBENZAPRINE HCL 10 MG PO TABS: 10.0000 mg | ORAL_TABLET | Freq: Two times a day (BID) | ORAL | 0 refills | Status: DC | PRN

## 2016-01-11 MED ORDER — DICYCLOMINE HCL 10 MG PO CAPS: 10.0000 mg | ORAL_CAPSULE | Freq: Two times a day (BID) | ORAL | 2 refills | Status: DC

## 2016-01-11 NOTE — Progress Notes (Signed)
Name: [name] [name]   MRN: [id number]    DOB: 1945-09-20   Date:01/11/2016       Progress Note  Subjective  Chief Complaint  Chief Complaint  Patient presents with  . Medication Refill    HPI  Chronic Low Back Pain: Pt. Presents for medication follow up, has chronic low back pain, sometimes radiating down the right leg, controlled on Oxycodone 10 mg twice daily as needed along with Cyclobenzaprine 10 mg twice daily as needed for associated muscle spasm. Reports pain fluctuates in intensity, worse with sitting for long periods of time.  Esophageal Tumors: Pt. Has history of benign esophageal tumors, taking Bentyl 10 mg two times daily as needed. Reports no problems as long as he is on medication but initial symptoms included dysphagia which led to endoscopy and the subsequent findings.  GERD: Pt. Has long-standing history of gastroesophageal reflux, worse with certain foods such as fried and greasy foods. He takes Pantoprazole 40 mg daily, which keeps his symptoms under control. No dysphagia, blood in stool, or abdominal pain.   Past Medical History:  Diagnosis Date  . Anginal pain (HCC)   . Arthritis   . Back pain    degenerative disc disease  . Carpal tunnel syndrome of right wrist   . CHF (congestive heart failure) (HCC)   . Coronary artery disease   . Diverticulosis   . GERD (gastroesophageal reflux disease)   . Myocardial infarction (HCC)   . Neuropathy (HCC)   . Wears dentures    full upper and lower  . Wears hearing aid    bilateral    Past Surgical History:  Procedure Laterality Date  . ANKLE RECONSTRUCTION Left 1991  . APPENDECTOMY    . BACK SURGERY N/A    lower back x 3  . CARDIAC CATHETERIZATION    . CARPAL TUNNEL RELEASE Left 03/2014   In Dr. Rosita Kea office  . CARPAL TUNNEL RELEASE Left 10/20/2014   Procedure: CARPAL TUNNEL RELEASE;  Surgeon: Kennedy Bucker, MD;  Location: [facility];  Service: Orthopedics;  Laterality: Left;  . CORONARY ARTERY BYPASS GRAFT   March 2014   [facility]  . ESOPHAGOGASTRODUODENOSCOPY (EGD) WITH PROPOFOL N/A 10/26/2015   Procedure: ESOPHAGOGASTRODUODENOSCOPY (EGD) WITH PROPOFOL with dialation;  Surgeon: [name], MD;  Location: [facility];  Service: Endoscopy;  Laterality: N/A;  . EYE SURGERY Bilateral    Cataract Extraction with IOL  . NECK SURGERY N/A    neck x 2, one discectomy and one shaved disc.  Marland Kitchen SHOULDER ARTHROSCOPY Right 06/06/2015   Procedure: right shoulder arthroscopy, arthroscopic debridement, subacromial decompression, SLAP repair, biotenodesis, mini open rotator cuff repair;  Surgeon: Christena Flake, MD;  Location: [facility];  Service: Orthopedics;  Laterality: Right;  . SHOULDER OPEN ROTATOR CUFF REPAIR     Left    History reviewed. No pertinent family history.  Social History   Social History  . Marital status: Married    Spouse name: N/A  . Number of children: N/A  . Years of education: N/A   Occupational History  . Not on file.   Social History Main Topics  . Smoking status: Former Smoker    Quit date: 06/18/2013  . Smokeless tobacco: Never Used  . Alcohol use 0.0 - 0.6 oz/week     Comment: occasionally  . Drug use: No  . Sexual activity: Not on file   Other Topics Concern  . Not on file   Social History Narrative  . No  narrative on file     Current Outpatient Prescriptions:  .  aspirin 81 MG tablet, Take 81 mg by mouth every morning., Disp: , Rfl:  .  cyclobenzaprine (FLEXERIL) 10 MG tablet, Take 1 tablet (10 mg total) by mouth 2 (two) times daily as needed., Disp: 180 tablet, Rfl: 0 .  dicyclomine (BENTYL) 10 MG capsule, TAKE ONE CAPSULE BY MOUTH TWICE DAILY, Disp: 60 capsule, Rfl: 0 .  docusate sodium (COLACE) 100 MG capsule, Take 100 mg by mouth at bedtime., Disp: , Rfl:  .  furosemide (LASIX) 20 MG tablet, Take 1 tablet (20 mg total) by mouth 2 (two) times daily., Disp: 60 tablet, Rfl: 2 .  gabapentin (NEURONTIN) 300 MG capsule, Take 1 capsule (300 mg  total) by mouth 2 (two) times daily., Disp: 180 capsule, Rfl: 0 .  KLOR-CON M20 20 MEQ tablet, TAKE ONE TABLET BY MOUTH ONCE DAILY, Disp: 30 tablet, Rfl: 0 .  nitroGLYCERIN (NITROSTAT) 0.4 MG SL tablet, Place 1 tablet (0.4 mg total) under the tongue every 5 (five) minutes as needed for chest pain. Max 3 doses within 15 mins, Disp: 30 tablet, Rfl: 0 .  Oxycodone HCl 10 MG TABS, Take 1 tablet (10 mg total) by mouth every 12 (twelve) hours as needed., Disp: 60 tablet, Rfl: 0 .  pantoprazole (PROTONIX) 40 MG tablet, TAKE ONE TABLET BY MOUTH IN THE MORNING, Disp: 90 tablet, Rfl: 0 .  vitamin E (VITAMIN E) 400 UNIT capsule, Take 400 Units by mouth daily., Disp: , Rfl:   Allergies  Allergen Reactions  . Parafon Forte Dsc [Chlorzoxazone] Rash and Itching    severe  . Cucumber Extract Other (See Comments)    GI upset     Review of Systems  Cardiovascular: Negative for leg swelling.  Gastrointestinal: Negative for abdominal pain, blood in stool, nausea and vomiting.  Musculoskeletal: Positive for back pain and joint pain.  Neurological: Positive for tingling.    Objective  Vitals:   01/11/16 0950  BP: 120/72  Pulse: 60  Resp: 16  Temp: 97.7 F (36.5 C)  TempSrc: Oral  SpO2: 96%  Weight: 212 lb 12.8 oz (96.5 kg)  Height: 5\' 7"  (1.702 m)    Physical Exam  Constitutional: He is oriented to person, place, and time and well-developed, well-nourished, and in no distress.  HENT:  Head: Normocephalic and atraumatic.  Cardiovascular: Normal rate, regular rhythm and normal heart sounds.   No murmur heard. Pulmonary/Chest: Effort normal and breath sounds normal. He has no wheezes.  Abdominal: Soft. Bowel sounds are normal.  Musculoskeletal:       Lumbar back: He exhibits tenderness and pain.       Back:  Neurological: He is alert and oriented to person, place, and time.  Psychiatric: Mood, memory, affect and judgment normal.  Nursing note and vitals reviewed.     Assessment &  Plan   1. Chronic right-sided low back pain with right-sided sciatica Stable, has some neuropathy in the feet, likely related to disc disease in lower back. Until you on gabapentin and cyclobenzaprine - gabapentin (NEURONTIN) 300 MG capsule; Take 1 capsule (300 mg total) by mouth 2 (two) times daily.  Dispense: 180 capsule; Refill: 0 - cyclobenzaprine (FLEXERIL) 10 MG tablet; Take 1 tablet (10 mg total) by mouth 2 (two) times daily as needed.  Dispense: 180 tablet; Refill: 0  2. Leg swelling No swelling at this time, continue on furosemide - furosemide (LASIX) 20 MG tablet; Take 1 tablet (20 mg total)  by mouth 2 (two) times daily.  Dispense: 180 tablet; Refill: 0  3. Gastroesophageal reflux disease, esophagitis presence not specified  - pantoprazole (PROTONIX) 40 MG tablet; Take 1 tablet (40 mg total) by mouth every morning.  Dispense: 90 tablet; Refill: 0  4. History of benign esophageal tumor  - dicyclomine (BENTYL) 10 MG capsule; Take 1 capsule (10 mg total) by mouth 2 (two) times daily.  Dispense: 180 capsule; Refill: 2   Kamiyah Kindel Asad [facility] 01/11/2016 10:01 AM

## 2016-01-14 ENCOUNTER — Other Ambulatory Visit: Payer: Self-pay | Admitting: Family Medicine

## 2016-01-15 ENCOUNTER — Telehealth: Payer: Self-pay

## 2016-01-15 MED ORDER — POTASSIUM CHLORIDE CRYS ER 20 MEQ PO TBCR: 20.0000 meq | EXTENDED_RELEASE_TABLET | Freq: Every day | ORAL | 1 refills | Status: DC

## 2016-01-15 NOTE — Telephone Encounter (Signed)
Medication has been refilled and sent to [facility] [address] 

## 2016-01-23 NOTE — Telephone Encounter (Signed)
Medication has been refilled and sent to [facility] [address] on 01/15/2016 pt has been notified

## 2016-01-29 NOTE — Telephone Encounter (Signed)
ERROR PLEASE DISREGARD

## 2016-02-14 ENCOUNTER — Other Ambulatory Visit: Payer: Self-pay | Admitting: Family Medicine

## 2016-02-14 DIAGNOSIS — G8929 Other chronic pain: Secondary | ICD-10-CM

## 2016-02-14 DIAGNOSIS — M5441 Lumbago with sciatica, right side: Principal | ICD-10-CM

## 2016-02-14 MED ORDER — GABAPENTIN 300 MG PO CAPS: 300.0000 mg | ORAL_CAPSULE | Freq: Two times a day (BID) | ORAL | 0 refills | Status: DC

## 2016-02-14 MED ORDER — OXYCODONE HCL 10 MG PO TABS: 10.0000 mg | ORAL_TABLET | Freq: Two times a day (BID) | ORAL | 0 refills | Status: DC | PRN

## 2016-02-23 DIAGNOSIS — G5602 Carpal tunnel syndrome, left upper limb: Secondary | ICD-10-CM | POA: Diagnosis not present

## 2016-02-23 DIAGNOSIS — M65332 Trigger finger, left middle finger: Secondary | ICD-10-CM | POA: Diagnosis not present

## 2016-04-01 ENCOUNTER — Other Ambulatory Visit: Payer: Self-pay | Admitting: Family Medicine

## 2016-04-08 ENCOUNTER — Emergency Department
Admission: EM | Admit: 2016-04-08 | Discharge: 2016-04-08 | Disposition: A | Discharge status: Home / Self Care | Payer: Medicare Other | Attending: Emergency Medicine | Admitting: Emergency Medicine

## 2016-04-08 ENCOUNTER — Encounter: Payer: Self-pay | Admitting: Emergency Medicine

## 2016-04-08 ENCOUNTER — Emergency Department: Payer: Medicare Other

## 2016-04-08 DIAGNOSIS — I509 Heart failure, unspecified: Secondary | ICD-10-CM | POA: Insufficient documentation

## 2016-04-08 DIAGNOSIS — Z79899 Other long term (current) drug therapy: Secondary | ICD-10-CM | POA: Diagnosis not present

## 2016-04-08 DIAGNOSIS — R0789 Other chest pain: Secondary | ICD-10-CM | POA: Insufficient documentation

## 2016-04-08 DIAGNOSIS — Z87891 Personal history of nicotine dependence: Secondary | ICD-10-CM | POA: Diagnosis not present

## 2016-04-08 DIAGNOSIS — R51 Headache: Secondary | ICD-10-CM | POA: Diagnosis not present

## 2016-04-08 DIAGNOSIS — I2510 Atherosclerotic heart disease of native coronary artery without angina pectoris: Secondary | ICD-10-CM | POA: Insufficient documentation

## 2016-04-08 DIAGNOSIS — Z7982 Long term (current) use of aspirin: Secondary | ICD-10-CM | POA: Diagnosis not present

## 2016-04-08 DIAGNOSIS — Z951 Presence of aortocoronary bypass graft: Secondary | ICD-10-CM | POA: Diagnosis not present

## 2016-04-08 LAB — CBC
HCT: 45.7 % (ref 40.0–52.0)
HEMOGLOBIN: 15.8 g/dL (ref 13.0–18.0)
MCH: 32 pg (ref 26.0–34.0)
MCHC: 34.5 g/dL (ref 32.0–36.0)
MCV: 92.7 fL (ref 80.0–100.0)
Platelets: 217 10*3/uL (ref 150–440)
RBC: 4.93 MIL/uL (ref 4.40–5.90)
RDW: 13.2 % (ref 11.5–14.5)
WBC: 10.2 10*3/uL (ref 3.8–10.6)

## 2016-04-08 LAB — BASIC METABOLIC PANEL
ANION GAP: 8 (ref 5–15)
BUN: 17 mg/dL (ref 6–20)
CALCIUM: 9.6 mg/dL (ref 8.9–10.3)
CO2: 23 mmol/L (ref 22–32)
Chloride: 105 mmol/L (ref 101–111)
Creatinine, Ser: 0.71 mg/dL (ref 0.61–1.24)
Glucose, Bld: 113 mg/dL — ABNORMAL HIGH (ref 65–99)
Potassium: 4 mmol/L (ref 3.5–5.1)
Sodium: 136 mmol/L (ref 135–145)

## 2016-04-08 LAB — TROPONIN I

## 2016-04-08 MED ORDER — GI COCKTAIL ~~LOC~~
30.0000 mL | Freq: Once | ORAL | Status: AC
  Administered 2016-04-08: 30 mL via ORAL
  Filled 2016-04-08: qty 30

## 2016-04-08 MED ORDER — HYDROMORPHONE HCL 1 MG/ML IJ SOLN
1.0000 mg | Freq: Once | INTRAMUSCULAR | Status: AC
  Administered 2016-04-08: 1 mg via INTRAVENOUS
  Filled 2016-04-08: qty 1

## 2016-04-08 MED ORDER — SODIUM CHLORIDE 0.9 % IV BOLUS (SEPSIS)
500.0000 mL | Freq: Once | INTRAVENOUS | Status: AC
Start: 2016-04-08 — End: 2016-04-08
  Administered 2016-04-08: 500 mL via INTRAVENOUS

## 2016-04-08 MED ORDER — METOCLOPRAMIDE HCL 5 MG/ML IJ SOLN
10.0000 mg | Freq: Once | INTRAMUSCULAR | Status: AC
  Administered 2016-04-08: 10 mg via INTRAVENOUS
  Filled 2016-04-08: qty 2

## 2016-04-08 NOTE — Discharge Instructions (Signed)
Please return immediately if condition worsens. Please contact her primary physician or the physician you were given for referral. If you have any specialist physicians involved in her treatment and plan please also contact them. Thank you for using [facility]. ° °

## 2016-04-08 NOTE — ED Provider Notes (Signed)
Time Seen: Approximately 1513 I have reviewed the triage notes  Chief Complaint: Chest Pain   History of Present Illness: [name] [name] is a 70 y.o. male *who presents with 2 focal complaints of some right-sided chest discomfort along with a frontal headache. Patient states that he's had epigastric pain which was thought to be reflux now for the past several days. He states today he noticed that the pain was up into the right side of his chest and describes it as a mild discomfort. Patient denies any fever, chills, productive cough. He denies any associated symptoms such as nausea, vomiting, shortness of breath. Denies any pleuritic or positional component. He does have a history of coronary artery disease with previous bypass surgery and 2014. He states he has not received a recent evaluation of his heart. He is followed locally by cardiologist Dr. Gwen Pounds. He states the headache started roughly the same time as a right-sided chest discomfort around noon today. Headache is in the front with no photophobia and no neck pain no focal weakness in either upper or lower extremities. No fever or head trauma. No visual disturbances.   Past Medical History:  Diagnosis Date  . Anginal pain (HCC)   . Arthritis   . Back pain    degenerative disc disease  . Carpal tunnel syndrome of right wrist   . CHF (congestive heart failure) (HCC)   . Coronary artery disease   . Diverticulosis   . GERD (gastroesophageal reflux disease)   . Myocardial infarction   . Neuropathy (HCC)   . Wears dentures    full upper and lower  . Wears hearing aid    bilateral    Patient Active Problem List   Diagnosis Date Noted  . ERRONEOUS ENCOUNTER--DISREGARD 01/29/2016  . Difficulty in swallowing   . Hiatal hernia   . Gastritis   . Heartburn 10/11/2015  . Influenza-like illness 08/18/2015  . Sinusitis, acute 07/20/2015  . Elevation of level of transaminase and lactic acid dehydrogenase (LDH) 05/11/2015  .  Back pain with radiation 05/11/2015  . Cardiomyopathy (HCC) 05/11/2015  . Esophageal lesion 05/11/2015  . Pain in the wrist 05/11/2015  . Low back pain with sciatica 05/11/2015  . Heart attack 05/11/2015  . Absent peripheral pulse 05/11/2015  . ERRONEOUS ENCOUNTER--DISREGARD 04/26/2015  . Back pain, chronic 02/03/2015  . Leg swelling 02/03/2015  . Edema leg 02/03/2015  . CAD in native artery 02/03/2015  . Narrowing of intervertebral disc space 02/03/2015  . Acid reflux 02/03/2015  . Hypercholesteremia 02/03/2015  . BP (high blood pressure) 02/03/2015  . Cardiomyopathy, ischemic 02/03/2015  . Arthritis of hand, degenerative 02/03/2015  . Insomnia secondary to chronic pain 11/14/2014  . 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 Drug-induced hypokalemia 11/14/2014  . History of decompression of median nerve 10/03/2014  . Acquired polyneuropathy (HCC) 09/28/2014  . Carpal tunnel syndrome 09/28/2014  . Peripheral neuropathy (HCC) 09/28/2014  . Chest pain 10/20/2013  . Other specified cardiac arrhythmias 08/23/2013  . H/O coronary artery bypass surgery 08/21/2013  . Chronic low back pain with right-sided sciatica 08/19/2013  . Difficulty hearing 08/19/2013  . Arteriosclerosis of coronary artery 08/19/2013  . Esophageal lump 08/19/2013    Past Surgical History:  Procedure Laterality Date  . ANKLE RECONSTRUCTION Left 1991  . APPENDECTOMY    . BACK SURGERY N/A    lower back x 3  . CARDIAC CATHETERIZATION    . CARPAL TUNNEL RELEASE Left 03/2014   In Dr. Rosita Kea  office  . CARPAL TUNNEL RELEASE Left 10/20/2014   Procedure: CARPAL TUNNEL RELEASE;  Surgeon: Kennedy BuckerMichael Menz, MD;  Location: [facility];  Service: Orthopedics;  Laterality: Left;  . CORONARY ARTERY [facility]  [facility]  . ESOPHAGOGASTRODUODENOSCOPY (EGD) WITH PROPOFOL N/A 10/26/2015   Procedure: ESOPHAGOGASTRODUODENOSCOPY (EGD) WITH PROPOFOL with dialation;  Surgeon: Midge Miniumarren Wohl, MD;  [facility];  Service: Endoscopy;  Laterality: N/A;  . EYE SURGERY Bilateral    Cataract Extraction with IOL  . NECK SURGERY N/A    neck x 2, one discectomy and one shaved disc.  Marland Kitchen. SHOULDER ARTHROSCOPY Right 06/06/2015   Procedure: right shoulder arthroscopy, arthroscopic debridement, subacromial decompression, SLAP repair, biotenodesis, mini open rotator cuff repair;  Surgeon: Christena FlakeJohn J Poggi, MD;  Location: [facility];  Service: Orthopedics;  Laterality: Right;  . SHOULDER OPEN ROTATOR CUFF REPAIR     Left    Past Surgical History:  Procedure Laterality Date  . ANKLE RECONSTRUCTION Left 1991  . APPENDECTOMY    . BACK SURGERY N/A    lower back x 3  . CARDIAC CATHETERIZATION    . CARPAL TUNNEL RELEASE Left 03/2014   In Dr. Rosita KeaMenz office  . CARPAL TUNNEL RELEASE Left 10/20/2014   Procedure: CARPAL TUNNEL RELEASE;  Surgeon: Kennedy BuckerMichael Menz, MD;  Location: [facility];  Service: Orthopedics;  Laterality: Left;  . CORONARY ARTERY BYPASS GRAFT  March [facility]  . ESOPHAGOGASTRODUODENOSCOPY (EGD) WITH PROPOFOL N/A 10/26/2015   Procedure: ESOPHAGOGASTRODUODENOSCOPY (EGD) WITH PROPOFOL with dialation;  Surgeon: Midge Miniumarren Wohl, MD;  [facility];  Service: Endoscopy;  Laterality: N/A;  . EYE SURGERY Bilateral    Cataract Extraction with IOL  . NECK SURGERY N/A    neck x 2, one discectomy and one shaved disc.  Marland Kitchen. SHOULDER ARTHROSCOPY Right 06/06/2015   Procedure: right shoulder arthroscopy, arthroscopic debridement, subacromial decompression, SLAP repair, biotenodesis, mini open rotator cuff repair;  Surgeon: Christena FlakeJohn J Poggi, MD;  Location: [facility];  Service: Orthopedics;  Laterality: Right;  . SHOULDER OPEN ROTATOR CUFF REPAIR     Left    Current Outpatient Rx  . Order #: [id number] Class: Historical Med  . Order #: [id number] Class: Normal  . Order #: [id number] Class: Normal  . Order #: [id number] Class: Historical Med  . Order #: [id number] Class: Normal  . Order #:  [id number] Class: Normal  . Order #: [id number] Class: Normal  . Order #: [id number] Class: Normal  . Order #: [id number] Class: Print  . Order #: [id number] Class: Normal  . Order #: [id number] Class: Historical Med    Allergies:  Parafon forte dsc [chlorzoxazone] and Cucumber extract  Family History: No family history on file.  Social History: Social History  Substance Use Topics  . Smoking status: Former Smoker    Quit date: 06/18/2013  . Smokeless tobacco: Never Used  . Alcohol use 0.0 - 0.6 oz/week     Comment: occasionally     Review of Systems:   10 point review of systems was performed and was otherwise negative:  Constitutional: No fever Eyes: No visual disturbances ENT: No sore throat, ear pain Cardiac: Pain is very mild right side of the chest without radiation. He states he did notice some mild relief with nitroglycerin and aspirin and received prior to arrival. Respiratory: No shortness of breath, wheezing, or stridor Abdomen: No abdominal pain, no vomiting, No diarrhea Endocrine: No weight loss, No night sweats Extremities: No peripheral edema, cyanosis Skin: No rashes, easy bruising Neurologic:  No focal weakness, trouble with speech or swollowing Urologic: No dysuria, Hematuria, or urinary frequency   Physical Exam:  ED Triage Vitals  Enc Vitals Group     BP 04/08/16 1232 127/75     Pulse Rate 04/08/16 1232 91     Resp 04/08/16 1232 20     Temp 04/08/16 1232 98 F (36.7 C)     Temp Source 04/08/16 1232 Oral     SpO2 04/08/16 1232 98 %     Weight 04/08/16 1231 212 lb (96.2 kg)     Height 04/08/16 1231 5\' 8"  (1.727 m)     Head Circumference --      Peak Flow --      Pain Score 04/08/16 1231 1     Pain Loc --      Pain Edu? --      Excl. in GC? --     General: Awake , Alert , and Oriented times 3; GCS 15 Head: Normal cephalic , atraumatic. No obvious reproducible nature to his headache Eyes: Pupils equal , round, reactive to light. Extraocular eye  movements are intact, conjunctiva is clear, no papilledema  Nose/Throat: No nasal drainage, patent upper airway without erythema or exudate.  Neck: Supple, Full range of motion, No anterior adenopathy or palpable thyroid masses. No meningeal signs Lungs: Clear to ascultation without wheezes , rhonchi, or rales Heart: Regular rate, regular rhythm without murmurs , gallops , or rubs Abdomen: Soft, non tender without rebound, guarding , or rigidity; bowel sounds positive and symmetric in all 4 quadrants. No organomegaly .        Extremities: 2 plus symmetric pulses. Pulses are symmetric. No edema, clubbing or cyanosis Neurologic: normal ambulation, Motor symmetric without deficits, sensory intact Skin: warm, dry, no rashes No reproducible chest wall pain  Labs:   All laboratory work was reviewed including any pertinent negatives or positives listed below:  Labs Reviewed  BASIC METABOLIC PANEL - Abnormal; Notable for the following:       Result Value   Glucose, Bld 113 (*)    All other components within normal limits  CBC  TROPONIN I    EKG:  ED ECG REPORT I, Jennye Moccasin, the attending physician, personally viewed and interpreted this ECG.  Date: 04/08/2016 EKG Time: 1232 Rate: 99 Rhythm: normal sinus rhythm QRS Axis: normal Intervals: normal ST/T Wave abnormalities: normal Conduction Disturbances: none Narrative Interpretation: unremarkable Poor R-wave progression in the lateral leads with QS complexes No significant ischemic changes in comparison to 06-02-2015  Radiology:  "Dg Chest 2 View  Result Date: 04/08/2016 CLINICAL DATA:  Mid right-sided chest pain associated with epigastric pain and headache. History of previous MIs, CABG, CHF. EXAM: CHEST  2 VIEW COMPARISON:  PA and lateral chest x-ray of August 18, 2015 FINDINGS: The lungs are adequately inflated. The interstitial markings are mildly increased. The cardiac silhouette is mildly enlarged. The pulmonary vascularity  is not engorged. There are post CABG changes. There is no pleural effusion or pneumothorax. There is mild multilevel degenerative disc disease of the thoracic spine. IMPRESSION: Chronic bronchitic changes. Stable mild cardiomegaly with mild pulmonary interstitial edema. No alveolar pneumonia. Electronically Signed   By: David  [place] M.D.   On: 04/08/2016 13:52   Ct Head Wo Contrast  Result Date: 04/08/2016 CLINICAL DATA:  Left temporal headache EXAM: CT HEAD WITHOUT CONTRAST TECHNIQUE: Contiguous axial images were obtained from the base of the skull through the vertex without intravenous contrast. COMPARISON:  08/29/2010 FINDINGS:  Brain: No intracranial hemorrhage, mass effect or midline shift. No acute cortical infarction. No mass lesion is noted on this unenhanced scan. Ventricular size is stable from prior exam. Mild cerebral atrophy. Vascular: No hyperdense vessel or unexpected calcification. Skull: Normal. Negative for fracture or focal lesion. Sinuses/Orbits: No acute finding. Other: None. IMPRESSION: No acute intracranial abnormality. No significant change. Mild cerebral atrophy. Electronically Signed   By: Natasha Mead M.D.   On: 04/08/2016 15:48  "  I personally reviewed the radiologic studies     ED Course:  Given the patient's current clinical presentation and objective findings I felt this was unlikely to be either a life-threatening cause for his headache nor a life-threatening cause of chest pain at this time. His EKG shows no ischemic changes and serial troponins were negative. He had relief with a GI cocktail here. For his headache the patient received a single dose of Dilaudid and Zofran and had improvement of his headache. All parties agree that is not needed a lumbar puncture at this time as I do not have a strong clinical suspicion for an intracerebral hemorrhage, subarachnoid hemorrhage, etc. Given the bilateral temporal nature of his headache without any visual disturbances of felt  she most likely was a muscle tension headache. Patient has equal blood pressures and pulses and I felt was unlikely to be an aortic or carotid dissection. Patient's case was reviewed with the on-call cardiologist for Dr. Gwen Pounds. He advised for the patient to call in the morning for follow-up as soon as possible. The patient was recommended by myself to go back on his omeprazole which she had stopped taking after recommendations from the primary physician. Clinical Course      Assessment:  Acute unspecified cephalgia Acute unspecified chest pain      Plan:  Outpatient Patient was advised to return immediately if condition worsens. Patient was advised to follow up with their primary care physician or other specialized physicians involved in their outpatient care. The patient and/or family member/power of attorney had laboratory results reviewed at the bedside. All questions and concerns were addressed and appropriate discharge instructions were distributed by the nursing staff.             Jennye Moccasin, MD 04/08/16 [phone]

## 2016-04-08 NOTE — ED Notes (Signed)
MD Huel Cote at the bedside.

## 2016-04-08 NOTE — ED Notes (Signed)
Pt. Verbalizes understanding of d/c instructions and follow-up. VS stable and pain controlled per pt.  Pt. In NAD at time of d/c and denies further concerns regarding this visit. Pt. Stable at the time of departure from the unit, departing unit by the safest and most appropriate manner per that pt condition and limitations. Pt advised to return to the ED at any time for emergent concerns, or for new/worsening symptoms.   

## 2016-04-08 NOTE — ED Triage Notes (Addendum)
Patient presents to the ED with centralized/right sided chest pain that patient describes as a non-radiating discomfort.  Patient reports headache that also began today.  Patient reports epigastric pain since Thursday but states today's chest pain feels different.  Patient denies shortness of breath and dizziness but reports feeling weak.  Patient is alert and oriented x 4.  Skin is warm and dry.  Patient reports history of open heart surgery and heart attacks.

## 2016-04-08 NOTE — ED Notes (Signed)
Blood pressure  LEFT ARM 123/84  RIGHT ARM 124/70

## 2016-04-12 ENCOUNTER — Telehealth: Payer: Self-pay | Admitting: Family Medicine

## 2016-04-12 NOTE — Telephone Encounter (Signed)
Pt informed and scheduled and appt

## 2016-04-12 NOTE — Telephone Encounter (Signed)
Pt needs refill on Oxycodone

## 2016-04-12 NOTE — Telephone Encounter (Signed)
Patient needs an appointment for medication refill.  °

## 2016-04-14 ENCOUNTER — Other Ambulatory Visit: Payer: Self-pay | Admitting: Family Medicine

## 2016-04-14 DIAGNOSIS — K219 Gastro-esophageal reflux disease without esophagitis: Secondary | ICD-10-CM

## 2016-04-18 ENCOUNTER — Ambulatory Visit (INDEPENDENT_AMBULATORY_CARE_PROVIDER_SITE_OTHER): Payer: 59 | Admitting: Family Medicine

## 2016-04-18 ENCOUNTER — Encounter: Payer: Self-pay | Admitting: Family Medicine

## 2016-04-18 VITALS — BP 129/76 | HR 76 | Temp 98.6°F | Resp 16 | Ht 68.0 in | Wt 212.9 lb

## 2016-04-18 DIAGNOSIS — Z86018 Personal history of other benign neoplasm: Secondary | ICD-10-CM | POA: Diagnosis not present

## 2016-04-18 DIAGNOSIS — G6289 Other specified polyneuropathies: Secondary | ICD-10-CM

## 2016-04-18 DIAGNOSIS — J0100 Acute maxillary sinusitis, unspecified: Secondary | ICD-10-CM

## 2016-04-18 DIAGNOSIS — K219 Gastro-esophageal reflux disease without esophagitis: Secondary | ICD-10-CM | POA: Diagnosis not present

## 2016-04-18 DIAGNOSIS — M5441 Lumbago with sciatica, right side: Secondary | ICD-10-CM | POA: Diagnosis not present

## 2016-04-18 DIAGNOSIS — E876 Hypokalemia: Secondary | ICD-10-CM | POA: Diagnosis not present

## 2016-04-18 DIAGNOSIS — T502X5A Adverse effect of carbonic-anhydrase inhibitors, benzothiadiazides and other diuretics, initial encounter: Secondary | ICD-10-CM | POA: Diagnosis not present

## 2016-04-18 DIAGNOSIS — G8929 Other chronic pain: Secondary | ICD-10-CM | POA: Diagnosis not present

## 2016-04-18 DIAGNOSIS — M7989 Other specified soft tissue disorders: Secondary | ICD-10-CM

## 2016-04-18 MED ORDER — AZITHROMYCIN 250 MG PO TABS: ORAL_TABLET | ORAL | 0 refills | Status: DC

## 2016-04-18 MED ORDER — FUROSEMIDE 20 MG PO TABS: 20.0000 mg | ORAL_TABLET | Freq: Every day | ORAL | 0 refills | Status: DC

## 2016-04-18 MED ORDER — DICYCLOMINE HCL 10 MG PO CAPS: 10.0000 mg | ORAL_CAPSULE | Freq: Two times a day (BID) | ORAL | 2 refills | Status: DC

## 2016-04-18 MED ORDER — PANTOPRAZOLE SODIUM 40 MG PO TBEC: 40.0000 mg | DELAYED_RELEASE_TABLET | Freq: Every morning | ORAL | 1 refills | Status: DC

## 2016-04-18 MED ORDER — OXYCODONE HCL 10 MG PO TABS: 10.0000 mg | ORAL_TABLET | Freq: Two times a day (BID) | ORAL | 0 refills | Status: DC | PRN

## 2016-04-18 MED ORDER — GABAPENTIN 300 MG PO CAPS: 300.0000 mg | ORAL_CAPSULE | Freq: Two times a day (BID) | ORAL | 0 refills | Status: DC

## 2016-04-18 MED ORDER — POTASSIUM CHLORIDE CRYS ER 20 MEQ PO TBCR: 20.0000 meq | EXTENDED_RELEASE_TABLET | Freq: Every day | ORAL | 1 refills | Status: DC

## 2016-04-18 NOTE — Progress Notes (Signed)
Name: [name] [name]   MRN: [id number]    DOB: 09-06-1945   Date:04/18/2016       Progress Note  Subjective  Chief Complaint  Chief Complaint  Patient presents with  . Medication Refill    Sinusitis  This is a new problem. The current episode started in the past 7 days. The problem has been gradually worsening since onset. There has been no fever. Associated symptoms include congestion, headaches and sinus pressure. Pertinent negatives include no sore throat. Past treatments include nothing.    Chronic Low Back Pain: Pt. Presents for medication follow up, has chronic low back pain, sometimes radiating down the right leg, controlled on Oxycodone 10 mg twice daily as needed along with Cyclobenzaprine 10 mg twice daily as needed for associated muscle spasm. Reports pain fluctuates in intensity, worse with sitting for long periods of time. Pain is worse at night, today its not bad at all.  Esophageal Tumors: Pt. Has history of benign esophageal tumors, taking Bentyl 10 mg two times daily as needed. Reports no problems as long as he is on medication.  GERD: Pt. Has long-standing history of gastroesophageal reflux, worse with certain foods such as fried and greasy foods. He takes Pantoprazole 40 mg daily, which keeps his symptoms under control. No dysphagia, blood in stool, or abdominal pain.  Peripheral Neuropathy: Pt. Has peripheral neuropathy, described as burning and tingling in his feet, worse at night. He takes Gabapentin 300 mg twice daily which seems to help relieve his symptoms. Neuropathy is thought to be from back surgeries.   Past Medical History:  Diagnosis Date  . Anginal pain (HCC)   . Arthritis   . Back pain    degenerative disc disease  . Carpal tunnel syndrome of right wrist   . CHF (congestive heart failure) (HCC)   . Coronary artery disease   . Diverticulosis   . GERD (gastroesophageal reflux disease)   . Myocardial infarction   . Neuropathy (HCC)   .  Wears dentures    full upper and lower  . Wears hearing aid    bilateral    Past Surgical History:  Procedure Laterality Date  . ANKLE RECONSTRUCTION Left 1991  . APPENDECTOMY    . BACK SURGERY N/A    lower back x 3  . CARDIAC CATHETERIZATION    . CARPAL TUNNEL RELEASE Left 03/2014   In Dr. Rosita KeaMenz office  . CARPAL TUNNEL RELEASE Left 10/20/2014   Procedure: CARPAL TUNNEL RELEASE;  Surgeon: Kennedy BuckerMichael Menz, MD;  Location: [facility];  Service: Orthopedics;  Laterality: Left;  . CORONARY [facility]  [facility]  . ESOPHAGOGASTRODUODENOSCOPY (EGD) WITH PROPOFOL N/A 10/26/2015   Procedure: ESOPHAGOGASTRODUODENOSCOPY (EGD) WITH PROPOFOL with dialation;  Surgeon: Midge Miniumarren Wohl, MD;  [facility];  Service: Endoscopy;  Laterality: N/A;  . EYE SURGERY Bilateral    Cataract Extraction with IOL  . NECK SURGERY N/A    neck x 2, one discectomy and one shaved disc.  Marland Kitchen. SHOULDER ARTHROSCOPY Right 06/06/2015   Procedure: right shoulder arthroscopy, arthroscopic debridement, subacromial decompression, SLAP repair, biotenodesis, mini open rotator cuff repair;  Surgeon: Christena FlakeJohn J Poggi, MD;  Location: [facility];  Service: Orthopedics;  Laterality: Right;  . SHOULDER OPEN ROTATOR CUFF REPAIR     Left    History reviewed. No pertinent family history.  Social History   Social History  . Marital status: Married    Spouse name: N/A  . Number of  children: N/A  . Years of education: N/A   Occupational History  . Not on file.   Social History Main Topics  . Smoking status: Former Smoker    Quit date: 06/18/2013  . Smokeless tobacco: Never Used  . Alcohol use 0.0 - 0.6 oz/week     Comment: occasionally  . Drug use: No  . Sexual activity: Not on file   Other Topics Concern  . Not on file   Social History Narrative  . No narrative on file     Current Outpatient Prescriptions:  .  aspirin 81 MG tablet, Take 81 mg by mouth every morning., Disp: , Rfl:  .   cyclobenzaprine (FLEXERIL) 10 MG tablet, Take 1 tablet (10 mg total) by mouth 2 (two) times daily as needed., Disp: 180 tablet, Rfl: 0 .  dicyclomine (BENTYL) 10 MG capsule, Take 1 capsule (10 mg total) by mouth 2 (two) times daily., Disp: 180 capsule, Rfl: 2 .  docusate sodium (COLACE) 100 MG capsule, Take 100 mg by mouth 2 (two) times daily., Disp: , Rfl:  .  furosemide (LASIX) 20 MG tablet, Take 1 tablet (20 mg total) by mouth 2 (two) times daily. (Patient taking differently: Take 20 mg by mouth daily. ), Disp: 180 tablet, Rfl: 0 .  gabapentin (NEURONTIN) 300 MG capsule, Take 1 capsule (300 mg total) by mouth 2 (two) times daily. (Patient taking differently: Take 300 mg by mouth at bedtime. ), Disp: 180 capsule, Rfl: 0 .  KLOR-CON M20 20 MEQ tablet, TAKE ONE TABLET BY MOUTH ONCE DAILY, Disp: 30 tablet, Rfl: 1 .  nitroGLYCERIN (NITROSTAT) 0.4 MG SL tablet, Place 1 tablet (0.4 mg total) under the tongue every 5 (five) minutes as needed for chest pain. Max 3 doses within 15 mins, Disp: 30 tablet, Rfl: 0 .  Oxycodone HCl 10 MG TABS, Take 1 tablet (10 mg total) by mouth every 12 (twelve) hours as needed., Disp: 60 tablet, Rfl: 0 .  pantoprazole (PROTONIX) 40 MG tablet, TAKE ONE TABLET BY MOUTH IN THE MORNING, Disp: 90 tablet, Rfl: 0 .  vitamin E (VITAMIN E) 400 UNIT capsule, Take 400 Units by mouth daily., Disp: , Rfl:   Allergies  Allergen Reactions  . Parafon Forte Dsc [Chlorzoxazone] Rash and Itching    severe  . Cucumber Extract Other (See Comments)    GI upset     Review of Systems  HENT: Positive for congestion and sinus pressure. Negative for sore throat.   Neurological: Positive for headaches.     Objective  Vitals:   04/18/16 1339  BP: 129/76  Pulse: 76  Resp: 16  Temp: 98.6 F (37 C)  TempSrc: Oral  SpO2: 95%  Weight: 212 lb 14.4 oz (96.6 kg)  Height: 5\' 8"  (1.727 m)    Physical Exam  Constitutional: He is oriented to person, place, and time and well-developed,  well-nourished, and in no distress.  HENT:  Head: Normocephalic and atraumatic.  Nose: Right sinus exhibits maxillary sinus tenderness. Left sinus exhibits maxillary sinus tenderness.  Mouth/Throat: No posterior oropharyngeal erythema.  Nasal mucosal inflammation, turbinate hypertrophy  Cardiovascular: Normal rate, regular rhythm and normal heart sounds.   No murmur heard. Pulmonary/Chest: Effort normal and breath sounds normal. He has no wheezes.  Abdominal: Soft. Bowel sounds are normal.  Musculoskeletal: He exhibits no edema.       Lumbar back: He exhibits tenderness and pain.       Back:  Neurological: He is alert and oriented to person,  place, and time.  Psychiatric: Mood, memory, affect and judgment normal.  Nursing note and vitals reviewed.     Assessment & Plan  1. Chronic right-sided low back pain with right-sided sciatica Chronic low back pain, responsive to opioid therapy. Refills provided - Oxycodone HCl 10 MG TABS; Take 1 tablet (10 mg total) by mouth every 12 (twelve) hours as needed.  Dispense: 60 tablet; Refill: 0  2. Gastroesophageal reflux disease, esophagitis presence not specified  - pantoprazole (PROTONIX) 40 MG tablet; Take 1 tablet (40 mg total) by mouth every morning.  Dispense: 90 tablet; Refill: 1  3. History of benign esophageal tumor  - dicyclomine (BENTYL) 10 MG capsule; Take 1 capsule (10 mg total) by mouth 2 (two) times daily.  Dispense: 180 capsule; Refill: 2  4. Leg swelling  - furosemide (LASIX) 20 MG tablet; Take 1 tablet (20 mg total) by mouth daily.  Dispense: 90 tablet; Refill: 0  5. Other polyneuropathy (HCC)  - gabapentin (NEURONTIN) 300 MG capsule; Take 1 capsule (300 mg total) by mouth 2 (two) times daily.  Dispense: 180 capsule; Refill: 0  6. Diuretic-induced hypokalemia  - potassium chloride SA (KLOR-CON M20) 20 MEQ tablet; Take 1 tablet (20 mEq total) by mouth daily.  Dispense: 90 tablet; Refill: 1   7. Acute non-recurrent  maxillary sinusitis  - azithromycin (ZITHROMAX) 250 MG tablet; 2 tabs po day 1, then 1 tab po q day x 4 days  Dispense: 6 tablet; Refill: 0 Note; total face-to-face time 40 minutes, greater than 50% was spent in counselin and coordination of care with the patient. This included addressing multiple chronic medical conditions, reviewing medications, addressing 1 acute medical problem. Jedediah Noda Asad A[facility] 04/18/2016 2:14 PM

## 2016-04-19 ENCOUNTER — Ambulatory Visit: Payer: 59 | Admitting: Family Medicine

## 2016-05-08 DIAGNOSIS — I25118 Atherosclerotic heart disease of native coronary artery with other forms of angina pectoris: Secondary | ICD-10-CM | POA: Diagnosis not present

## 2016-05-09 ENCOUNTER — Encounter: Payer: 59 | Admitting: Family Medicine

## 2016-05-13 DIAGNOSIS — I2510 Atherosclerotic heart disease of native coronary artery without angina pectoris: Secondary | ICD-10-CM | POA: Diagnosis not present

## 2016-05-13 DIAGNOSIS — I10 Essential (primary) hypertension: Secondary | ICD-10-CM | POA: Diagnosis not present

## 2016-05-13 DIAGNOSIS — E7800 Pure hypercholesterolemia, unspecified: Secondary | ICD-10-CM | POA: Diagnosis not present

## 2016-05-29 ENCOUNTER — Telehealth: Payer: Self-pay | Admitting: Family Medicine

## 2016-05-29 ENCOUNTER — Other Ambulatory Visit: Payer: Self-pay | Admitting: Family Medicine

## 2016-05-29 DIAGNOSIS — R0989 Other specified symptoms and signs involving the circulatory and respiratory systems: Secondary | ICD-10-CM

## 2016-05-29 NOTE — Telephone Encounter (Signed)
Patient states that you have given him a referral last year for a vascular doctor on [address]. Patient was not able to go at the time due to surgery on rotator cuff. He is now having complications again and is asking that you please resend the referral because it is out dated. If you have any questions you can call wife Okey Dupre) at [phone]

## 2016-05-29 NOTE — Progress Notes (Signed)
Referral is entered and authorized

## 2016-05-29 NOTE — Telephone Encounter (Signed)
Referral has been placed. 

## 2016-05-31 ENCOUNTER — Telehealth: Payer: Self-pay | Admitting: Family Medicine

## 2016-05-31 ENCOUNTER — Encounter: Payer: Self-pay | Admitting: Family Medicine

## 2016-05-31 ENCOUNTER — Ambulatory Visit (INDEPENDENT_AMBULATORY_CARE_PROVIDER_SITE_OTHER): Payer: 59 | Admitting: Family Medicine

## 2016-05-31 ENCOUNTER — Other Ambulatory Visit: Payer: Self-pay | Admitting: Family Medicine

## 2016-05-31 VITALS — BP 132/76 | HR 60 | Temp 98.6°F | Resp 15 | Ht 68.0 in | Wt 215.9 lb

## 2016-05-31 DIAGNOSIS — H6123 Impacted cerumen, bilateral: Secondary | ICD-10-CM

## 2016-05-31 DIAGNOSIS — B9789 Other viral agents as the cause of diseases classified elsewhere: Secondary | ICD-10-CM

## 2016-05-31 DIAGNOSIS — J069 Acute upper respiratory infection, unspecified: Secondary | ICD-10-CM

## 2016-05-31 DIAGNOSIS — J0100 Acute maxillary sinusitis, unspecified: Secondary | ICD-10-CM

## 2016-05-31 DIAGNOSIS — G8929 Other chronic pain: Secondary | ICD-10-CM

## 2016-05-31 DIAGNOSIS — H6120 Impacted cerumen, unspecified ear: Secondary | ICD-10-CM | POA: Insufficient documentation

## 2016-05-31 DIAGNOSIS — Z0000 Encounter for general adult medical examination without abnormal findings: Secondary | ICD-10-CM

## 2016-05-31 DIAGNOSIS — M5441 Lumbago with sciatica, right side: Secondary | ICD-10-CM | POA: Diagnosis not present

## 2016-05-31 MED ORDER — OXYCODONE HCL 10 MG PO TABS: 10.0000 mg | ORAL_TABLET | Freq: Two times a day (BID) | ORAL | 0 refills | Status: DC | PRN

## 2016-05-31 NOTE — Progress Notes (Signed)
Name: [name] [name]   MRN: [id number]    DOB: 1945-10-13   Date:05/31/2016       Progress Note  Subjective  Chief Complaint  Chief Complaint  Patient presents with  . Annual Exam    CPE    HPI  Pt. Presents for Complete Physical Exam. Colonoscopy 2010, repeat in 2020. Does not remember the last time he had prostate exam.    Past Medical History:  Diagnosis Date  . Anginal pain (HCC)   . Arthritis   . Back pain    degenerative disc disease  . Carpal tunnel syndrome of right wrist   . CHF (congestive heart failure) (HCC)   . Coronary artery disease   . Diverticulosis   . GERD (gastroesophageal reflux disease)   . Myocardial infarction   . Neuropathy (HCC)   . Wears dentures    full upper and lower  . Wears hearing aid    bilateral    Past Surgical History:  Procedure Laterality Date  . ANKLE RECONSTRUCTION Left 1991  . APPENDECTOMY    . BACK SURGERY N/A    lower back x 3  . CARDIAC CATHETERIZATION    . CARPAL TUNNEL RELEASE Left 03/2014   In Dr. Rosita KeaMenz office  . CARPAL TUNNEL RELEASE Left 10/20/2014   Procedure: CARPAL TUNNEL RELEASE;  Surgeon: Kennedy BuckerMichael Menz, MD;  Location: [facility];  Service: Orthopedics;  Laterality: Left;  . [facility]  [facility]  . ESOPHAGOGASTRODUODENOSCOPY (EGD) WITH PROPOFOL N/A 10/26/2015   Procedure: ESOPHAGOGASTRODUODENOSCOPY (EGD) WITH PROPOFOL with dialation;  Surgeon: Midge Miniumarren Wohl, MD;  [facility];  Service: Endoscopy;  Laterality: N/A;  . EYE SURGERY Bilateral    Cataract Extraction with IOL  . NECK SURGERY N/A    neck x 2, one discectomy and one shaved disc.  Marland Kitchen. SHOULDER ARTHROSCOPY Right 06/06/2015   Procedure: right shoulder arthroscopy, arthroscopic debridement, subacromial decompression, SLAP repair, biotenodesis, mini open rotator cuff repair;  Surgeon: Christena FlakeJohn J Poggi, MD;  Location: [facility];  Service: Orthopedics;  Laterality: Right;  . SHOULDER OPEN ROTATOR CUFF  REPAIR     Left    History reviewed. No pertinent family history.  Social History   Social History  . Marital status: Married    Spouse name: N/A  . Number of children: N/A  . Years of education: N/A   Occupational History  . Not on file.   Social History Main Topics  . Smoking status: Former Smoker    Quit date: 06/18/2013  . Smokeless tobacco: Never Used  . Alcohol use 0.0 - 0.6 oz/week     Comment: occasionally  . Drug use: No  . Sexual activity: Not on file   Other Topics Concern  . Not on file   Social History Narrative  . No narrative on file     Current Outpatient Prescriptions:  .  aspirin 81 MG tablet, Take 81 mg by mouth every morning., Disp: , Rfl:  .  cyclobenzaprine (FLEXERIL) 10 MG tablet, Take 1 tablet (10 mg total) by mouth 2 (two) times daily as needed., Disp: 180 tablet, Rfl: 0 .  dicyclomine (BENTYL) 10 MG capsule, Take 1 capsule (10 mg total) by mouth 2 (two) times daily., Disp: 180 capsule, Rfl: 2 .  docusate sodium (COLACE) 100 MG capsule, Take 100 mg by mouth 2 (two) times daily., Disp: , Rfl:  .  gabapentin (NEURONTIN) 300 MG capsule, Take 1 capsule (300 mg total)  by mouth 2 (two) times daily., Disp: 180 capsule, Rfl: 0 .  nitroGLYCERIN (NITROSTAT) 0.4 MG SL tablet, Place 1 tablet (0.4 mg total) under the tongue every 5 (five) minutes as needed for chest pain. Max 3 doses within 15 mins, Disp: 30 tablet, Rfl: 0 .  Oxycodone HCl 10 MG TABS, Take 1 tablet (10 mg total) by mouth every 12 (twelve) hours as needed., Disp: 60 tablet, Rfl: 0 .  pantoprazole (PROTONIX) 40 MG tablet, Take 1 tablet (40 mg total) by mouth every morning., Disp: 90 tablet, Rfl: 1 .  vitamin E (VITAMIN E) 400 UNIT capsule, Take 400 Units by mouth daily., Disp: , Rfl:  .  azithromycin (ZITHROMAX) 250 MG tablet, 2 tabs po day 1, then 1 tab po q day x 4 days (Patient not taking: Reported on 05/31/2016), Disp: 6 tablet, Rfl: 0 .  furosemide (LASIX) 20 MG tablet, Take 1 tablet (20 mg  total) by mouth daily. (Patient not taking: Reported on 05/31/2016), Disp: 90 tablet, Rfl: 0 .  potassium chloride SA (KLOR-CON M20) 20 MEQ tablet, Take 1 tablet (20 mEq total) by mouth daily. (Patient not taking: Reported on 05/31/2016), Disp: 90 tablet, Rfl: 1  Allergies  Allergen Reactions  . Parafon Forte Dsc [Chlorzoxazone] Rash and Itching    severe  . Cucumber Extract Other (See Comments)    GI upset     Review of Systems  Constitutional: Negative for chills, fever, malaise/fatigue and weight loss.  HENT: Positive for sore throat. Negative for congestion and ear pain.   Eyes: Negative for blurred vision and double vision.  Respiratory: Negative for cough, hemoptysis, shortness of breath and wheezing.   Cardiovascular: Negative for chest pain, palpitations and leg swelling.  Gastrointestinal: Negative for abdominal pain, blood in stool, constipation, diarrhea, nausea and vomiting.  Genitourinary: Negative for dysuria and hematuria.  Musculoskeletal: Positive for back pain. Negative for joint pain and neck pain.  Neurological: Negative for dizziness and headaches.  Psychiatric/Behavioral: Negative for depression. The patient is not nervous/anxious and does not have insomnia.      Objective  Vitals:   05/31/16 1050  BP: 132/76  Pulse: 60  Resp: 15  Temp: 98.6 F (37 C)  TempSrc: Oral  SpO2: 96%  Weight: 215 lb 14.4 oz (97.9 kg)  Height: 5\' 8"  (1.727 m)    Physical Exam  Constitutional: He is oriented to person, place, and time and well-developed, well-nourished, and in no distress.  HENT:  Head: Normocephalic and atraumatic.  Bilateral cerumen impaction  Cardiovascular: Normal rate, regular rhythm and normal heart sounds.   No murmur heard. Pulmonary/Chest: Effort normal and breath sounds normal. He has no wheezes.  Abdominal: Soft. Bowel sounds are normal. There is no tenderness.  Genitourinary: Rectum normal. Rectal exam shows no external hemorrhoid. Prostate  is enlarged (mildly enlarged). Prostate is not tender.  Musculoskeletal:       Right ankle: He exhibits no swelling.       Left ankle: He exhibits no swelling.  Neurological: He is alert and oriented to person, place, and time.  Skin: Skin is warm, dry and intact.  Psychiatric: Mood, memory, affect and judgment normal.  Nursing note and vitals reviewed.       Assessment & Plan  1. Annual physical exam Obtained age appropriate laboratory screenings - CBC with Differential - Comprehensive Metabolic Panel (CMET) - Lipid Profile - PSA - TSH - Vitamin D (25 hydroxy)  2. Viral URI Likely viral etiology, advised conservative treatment  3. Bilateral impacted cerumen Removal of cerumen from both ears.  4. Chronic right-sided low back pain with right-sided sciatica Stable and responsive to opioid therapy, refills provided - Oxycodone HCl 10 MG TABS; Take 1 tablet (10 mg total) by mouth every 12 (twelve) hours as needed.  Dispense: 60 tablet; Refill: 0   Eleina Jergens [facility]ad [facility] 05/31/2016 10:58 AM

## 2016-05-31 NOTE — Telephone Encounter (Signed)
Pt said that the dr told her to tell the front staff to place a message about calling her in a zpack but helen told the wife she would handle it. The dr told me to let the patients wife know that this would be done before 5pm today. Gave her this message.

## 2016-06-04 ENCOUNTER — Ambulatory Visit (INDEPENDENT_AMBULATORY_CARE_PROVIDER_SITE_OTHER): Payer: Medicare Other | Admitting: Vascular Surgery

## 2016-06-04 ENCOUNTER — Encounter (INDEPENDENT_AMBULATORY_CARE_PROVIDER_SITE_OTHER): Payer: Self-pay | Admitting: Vascular Surgery

## 2016-06-04 VITALS — BP 144/81 | HR 66 | Resp 16 | Ht 67.0 in | Wt 215.0 lb

## 2016-06-04 DIAGNOSIS — I2510 Atherosclerotic heart disease of native coronary artery without angina pectoris: Secondary | ICD-10-CM | POA: Diagnosis not present

## 2016-06-04 DIAGNOSIS — I10 Essential (primary) hypertension: Secondary | ICD-10-CM | POA: Diagnosis not present

## 2016-06-04 DIAGNOSIS — M79604 Pain in right leg: Secondary | ICD-10-CM

## 2016-06-04 DIAGNOSIS — E7800 Pure hypercholesterolemia, unspecified: Secondary | ICD-10-CM | POA: Diagnosis not present

## 2016-06-04 DIAGNOSIS — M79609 Pain in unspecified limb: Secondary | ICD-10-CM | POA: Insufficient documentation

## 2016-06-04 DIAGNOSIS — M5440 Lumbago with sciatica, unspecified side: Secondary | ICD-10-CM

## 2016-06-04 DIAGNOSIS — M79605 Pain in left leg: Secondary | ICD-10-CM

## 2016-06-04 DIAGNOSIS — G8929 Other chronic pain: Secondary | ICD-10-CM

## 2016-06-04 NOTE — Assessment & Plan Note (Signed)
Clearly has an atherosclerotic history with the heart so makes lower extremity peripheral arterial disease more worrisome.

## 2016-06-04 NOTE — Assessment & Plan Note (Signed)
Could certainly be contributing to his lower extremity neuropathy symptoms.

## 2016-06-04 NOTE — Assessment & Plan Note (Signed)
blood pressure control important in reducing the progression of atherosclerotic disease. On appropriate oral medications.  

## 2016-06-04 NOTE — Progress Notes (Signed)
Patient ID: [name] [name], male   DOB: [birth year], 71 y.o.   MRN: [id number]  Chief Complaint  Patient presents with  . New Evaluation    Numbness and burning in leg and feet    HPI [name] [name] is a 71 y.o. male.  I am asked to see the patient by Dr. Trena Platt for evaluation of peripheral arterial disease as a potential cause of his lower extremity pain.  The patient reports years of worsening pain in his feet and lower legs. He has been treated unsuccessfully for neuropathy for many months now. Despite medications, his lower extremity symptoms seem to only be worsening. Both lower extremities are affected. He says that he started to notice the symptoms several years ago after the initiation of statin therapy for his hyperlipidemia. He has since stopped the statins due to intolerance, but his symptoms persist. He does not have ulceration or infection. He has no fever or chills or signs of lower extremity symptoms. He does not really describe typical claudication symptoms, says that sometimes getting up and moving his legs actually makes him feel better.   Past Medical History:  Diagnosis Date  . Anginal pain ([place])   . Arthritis   . Back pain    degenerative disc disease  . Carpal tunnel syndrome of right wrist   . CHF (congestive heart failure) ([place])   . Coronary artery disease   . Diverticulosis   . GERD (gastroesophageal reflux disease)   . Myocardial infarction   . Neuropathy ([place])   . Wears dentures    full upper and lower  . Wears hearing aid    bilateral    Past Surgical History:  Procedure Laterality Date  . ANKLE RECONSTRUCTION Left 1991  . APPENDECTOMY    . BACK SURGERY N/A    lower back x 3  . CARDIAC CATHETERIZATION    . CARPAL TUNNEL RELEASE Left 03/2014   In Dr. Rudene Christians office  . CARPAL TUNNEL RELEASE Left 10/20/2014   Procedure: CARPAL TUNNEL RELEASE;  Surgeon: Hessie Knows, MD;  Location: [facility];  Service: Orthopedics;  Laterality: Left;  . CORONARY  ARTERY BYPASS GRAFT  March 2014   [facility]  . ESOPHAGOGASTRODUODENOSCOPY (EGD) WITH PROPOFOL N/A 10/26/2015   Procedure: ESOPHAGOGASTRODUODENOSCOPY (EGD) WITH PROPOFOL with dialation;  Surgeon: Lucilla Lame, MD;  Location: [place];  Service: Endoscopy;  Laterality: N/A;  . EYE SURGERY Bilateral    Cataract Extraction with IOL  . NECK SURGERY N/A    neck x 2, one discectomy and one shaved disc.  Marland Kitchen SHOULDER ARTHROSCOPY Right 06/06/2015   Procedure: right shoulder arthroscopy, arthroscopic debridement, subacromial decompression, SLAP repair, biotenodesis, mini open rotator cuff repair;  Surgeon: Corky Mull, MD;  Location: [facility];  Service: Orthopedics;  Laterality: Right;  . SHOULDER OPEN ROTATOR CUFF REPAIR     Left    Family history No bleeding disorders, clotting disorders, autoimmune diseases, or aneurysms  Social History Social History  Substance Use Topics  . Smoking status: Former Smoker    Quit date: 06/18/2013  . Smokeless tobacco: Never Used  . Alcohol use 0.0 - 0.6 oz/week     Comment: occasionally   Married  Allergies  Allergen Reactions  . Parafon Forte Dsc [Chlorzoxazone] Rash and Itching    severe  . Cucumber Extract Other (See Comments)    GI upset    Current Outpatient Prescriptions  Medication Sig Dispense Refill  . aspirin 81 MG tablet  Take 81 mg by mouth every morning.    Marland Kitchen azithromycin (ZITHROMAX) 250 MG tablet 2 tabs po day 1, then 1 tab po q day x 4 days 6 tablet 0  . cyclobenzaprine (FLEXERIL) 10 MG tablet Take 1 tablet (10 mg total) by mouth 2 (two) times daily as needed. 180 tablet 0  . dicyclomine (BENTYL) 10 MG capsule Take 1 capsule (10 mg total) by mouth 2 (two) times daily. 180 capsule 2  . docusate sodium (COLACE) 100 MG capsule Take 100 mg by mouth 2 (two) times daily.    . furosemide (LASIX) 20 MG tablet Take 1 tablet (20 mg total) by mouth daily. 90 tablet 0  . gabapentin (NEURONTIN) 300 MG capsule Take 1 capsule (300  mg total) by mouth 2 (two) times daily. 180 capsule 0  . isosorbide mononitrate (IMDUR) 30 MG 24 hr tablet     . nitroGLYCERIN (NITROSTAT) 0.4 MG SL tablet Place 1 tablet (0.4 mg total) under the tongue every 5 (five) minutes as needed for chest pain. Max 3 doses within 15 mins 30 tablet 0  . Oxycodone HCl 10 MG TABS Take 1 tablet (10 mg total) by mouth every 12 (twelve) hours as needed. 60 tablet 0  . pantoprazole (PROTONIX) 40 MG tablet Take 1 tablet (40 mg total) by mouth every morning. 90 tablet 1  . potassium chloride SA (KLOR-CON M20) 20 MEQ tablet Take 1 tablet (20 mEq total) by mouth daily. 90 tablet 1  . vitamin E (VITAMIN E) 400 UNIT capsule Take 400 Units by mouth daily.     No current facility-administered medications for this visit.       REVIEW OF SYSTEMS (Negative unless checked)  Constitutional: []Weight loss  []Fever  []Chills Cardiac: []Chest pain   []Chest pressure   []Palpitations   []Shortness of breath when laying flat   []Shortness of breath at rest   []Shortness of breath with exertion. Vascular:  []Pain in legs with walking   []Pain in legs at rest   []Pain in legs when laying flat   []Claudication   [x]Pain in feet when walking  [x]Pain in feet at rest  []Pain in feet when laying flat   []History of DVT   []Phlebitis   []Swelling in legs   []Varicose veins   []Non-healing ulcers Pulmonary:   []Uses home oxygen   []Productive cough   []Hemoptysis   []Wheeze  []COPD   []Asthma Neurologic:  []Dizziness  []Blackouts   []Seizures   []History of stroke   []History of TIA  []Aphasia   []Temporary blindness   []Dysphagia   []Weakness or numbness in arms   [x]Weakness or numbness in legs Musculoskeletal:  []Arthritis   []Joint swelling   []Joint pain   []Low back pain Hematologic:  []Easy bruising  []Easy bleeding   []Hypercoagulable state   []Anemic  []Hepatitis Gastrointestinal:  []Blood in stool   []Vomiting blood  []Gastroesophageal reflux/heartburn   []Abdominal  pain Genitourinary:  []Chronic kidney disease   []Difficult urination  []Frequent urination  []Burning with urination   []Hematuria Skin:  []Rashes   []Ulcers   []Wounds Psychological:  []History of anxiety   [] History of major depression.    Physical Exam BP (!) 144/81 (BP Location: Right Arm)   Pulse 66   Resp 16   Ht 5' 7" (1.702 m)   Wt 215 lb (97.5 kg)   BMI 33.67 kg/m  Gen:  WD/WN, NAD Head: [place]/AT, No temporalis wasting. Prominent temp  pulse not noted. Ear/Nose/Throat: Hearing grossly intact, nares w/o erythema or drainage, oropharynx w/o Erythema/Exudate Eyes: Conjunctiva clear, sclera non-icteric  Neck: trachea midline.  No bruit or JVD.  Pulmonary:  Good air movement, clear to auscultation bilaterally.  Cardiac: RRR, normal S1, S2, no Murmurs, rubs or gallops. Vascular:  Vessel Right Left  Radial Palpable Palpable  Ulnar Palpable Palpable  Brachial Palpable Palpable  Carotid Palpable, without bruit Palpable, without bruit  Aorta Not palpable N/A  Femoral Palpable Palpable  Popliteal Palpable Palpable  PT Palpable Trace Palpable  DP 1+ Palpable Palpable   Gastrointestinal: soft, non-tender/non-distended. No guarding/reflex. No masses, surgical incisions, or scars. Musculoskeletal: M/S 5/5 throughout.  Extremities without ischemic changes.  No deformity or atrophy.  Neurologic: Sensation grossly intact in extremities.  Symmetrical.  Speech is fluent. Motor exam as listed above. Psychiatric: Judgment intact, Mood & affect appropriate for pt's clinical situation. Dermatologic: No rashes or ulcers noted.  No cellulitis or open wounds. Lymph : No Cervical, Axillary, or Inguinal lymphadenopathy.   Radiology No results found.  Labs Recent Results (from the past 2160 hour(s))  Basic metabolic panel     Status: Abnormal   Collection Time: 04/08/16 12:36 PM  Result Value Ref Range   Sodium 136 135 - 145 mmol/L   Potassium 4.0 3.5 - 5.1 mmol/L   Chloride 105 101 -  111 mmol/L   CO2 23 22 - 32 mmol/L   Glucose, Bld 113 (H) 65 - 99 mg/dL   BUN 17 6 - 20 mg/dL   Creatinine, Ser 0.71 0.61 - 1.24 mg/dL   Calcium 9.6 8.9 - 10.3 mg/dL   GFR calc non Af Amer >60 >60 mL/min   GFR calc Af Amer >60 >60 mL/min    Comment: (NOTE) The eGFR has been calculated using the CKD EPI equation. This calculation has not been validated in all clinical situations. eGFR's persistently <60 mL/min signify possible Chronic Kidney Disease.    Anion gap 8 5 - 15  CBC     Status: None   Collection Time: 04/08/16 12:36 PM  Result Value Ref Range   WBC 10.2 3.8 - 10.6 K/uL   RBC 4.93 4.40 - 5.90 MIL/uL   Hemoglobin 15.8 13.0 - 18.0 g/dL   HCT 45.7 40.0 - 52.0 %   MCV 92.7 80.0 - 100.0 fL   MCH 32.0 26.0 - 34.0 pg   MCHC 34.5 32.0 - 36.0 g/dL   RDW 13.2 11.5 - 14.5 %   Platelets 217 150 - 440 K/uL  Troponin I     Status: None   Collection Time: 04/08/16 12:36 PM  Result Value Ref Range   Troponin I <0.03 <0.03 ng/mL  Troponin I     Status: None   Collection Time: 04/08/16  4:22 PM  Result Value Ref Range   Troponin I <0.03 <0.03 ng/mL    Assessment/Plan:  Hypercholesteremia Leg pain started after statin use so not able to take these. Hasn't atherosclerotic history of previous coronary disease so diet and exercise will be very important.  Back pain, chronic Could certainly be contributing to his lower extremity neuropathy symptoms.  BP (high blood pressure) blood pressure control important in reducing the progression of atherosclerotic disease. On appropriate oral medications.   CAD in native artery Clearly has an atherosclerotic history with the heart so makes lower extremity peripheral arterial disease more worrisome.  Pain in limb Although his lower extremity symptoms sound more neuropathic in nature, given his significant atherosclerotic risk factors  and multiple comorbidities as well as a previous history of coronary disease, I think it is important to  assess him for peripheral arterial disease as well. I have discussed the pathophysiology and natural history of peripheral arterial disease. We will obtain noninvasive studies in the near future at his convenience. I will see him back following these studies to discuss the results and determine further treatment options.      Leotis Pain 06/04/2016, 11:22 AM   This note was created with Dragon medical transcription system.  Any errors from dictation are unintentional.

## 2016-06-04 NOTE — Patient Instructions (Signed)
Peripheral Vascular Disease Peripheral vascular disease (PVD) is a disease of the blood vessels that are not part of your heart and brain. A simple term for PVD is poor circulation. In most cases, PVD narrows the blood vessels that carry blood from your heart to the rest of your body. This can result in a decreased supply of blood to your arms, legs, and internal organs, like your stomach or kidneys. However, it most often affects a person's lower legs and feet. There are two types of PVD.  Organic PVD. This is the more common type. It is caused by damage to the structure of blood vessels.  Functional PVD. This is caused by conditions that make blood vessels contract and tighten (spasm).  Without treatment, PVD tends to get worse over time. PVD can also lead to acute ischemic limb. This is when an arm or limb suddenly has trouble getting enough blood. This is a medical emergency. What are the causes? Each type of PVD has many different causes. The most common cause of PVD is buildup of a fatty material (plaque) inside of your arteries (atherosclerosis). Small amounts of plaque can break off from the walls of the blood vessels and become lodged in a smaller artery. This blocks blood flow and can cause acute ischemic limb. Other common causes of PVD include:  Blood clots that form inside of blood vessels.  Injuries to blood vessels.  Diseases that cause inflammation of blood vessels or cause blood vessel spasms.  Health behaviors and health history that increase your risk of developing PVD.  What increases the risk? You may have a greater risk of PVD if you:  Have a family history of PVD.  Have certain medical conditions, including: ? High cholesterol. ? Diabetes. ? High blood pressure (hypertension). ? Coronary heart disease. ? Past problems with blood clots. ? Past injury, such as burns or a broken bone. These may have damaged blood vessels in your limbs. ? Buerger disease. This is  caused by inflamed blood vessels in your hands and feet. ? Some forms of arthritis. ? Rare birth defects that affect the arteries in your legs.  Use tobacco.  Do not get enough exercise.  Are obese.  Are age 50 or older.  What are the signs or symptoms? PVD may cause many different symptoms. Your symptoms depend on what part of your body is not getting enough blood. Some common signs and symptoms include:  Cramps in your lower legs. This may be a symptom of poor leg circulation (claudication).  Pain and weakness in your legs while you are physically active that goes away when you rest (intermittent claudication).  Leg pain when at rest.  Leg numbness, tingling, or weakness.  Coldness in a leg or foot, especially when compared with the other leg.  Skin or hair changes. These can include: ? Hair loss. ? Shiny skin. ? Pale or bluish skin. ? Thick toenails.  Inability to get or maintain an erection (erectile dysfunction).  People with PVD are more prone to developing ulcers and sores on their toes, feet, or legs. These may take longer than normal to heal. How is this diagnosed? Your health care provider may diagnose PVD from your signs and symptoms. The health care provider will also do a physical exam. You may have tests to find out what is causing your PVD and determine its severity. Tests may include:  Blood pressure recordings from your arms and legs and measurements of the strength of your pulses (  pulse volume recordings).  Imaging studies using sound waves to take pictures of the blood flow through your blood vessels (Doppler ultrasound).  Injecting a dye into your blood vessels before having imaging studies using: ? X-rays (angiogram or arteriogram). ? Computer-generated X-rays (CT angiogram). ? A powerful electromagnetic field and a computer (magnetic resonance angiogram or MRA).  How is this treated? Treatment for PVD depends on the cause of your condition and the  severity of your symptoms. It also depends on your age. Underlying causes need to be treated and controlled. These include long-lasting (chronic) conditions, such as diabetes, high cholesterol, and high blood pressure. You may need to first try making lifestyle changes and taking medicines. Surgery may be needed if these do not work. Lifestyle changes may include:  Quitting smoking.  Exercising regularly.  Following a low-fat, low-cholesterol diet.  Medicines may include:  Blood thinners to prevent blood clots.  Medicines to improve blood flow.  Medicines to improve your blood cholesterol levels.  Surgical procedures may include:  A procedure that uses an inflated balloon to open a blocked artery and improve blood flow (angioplasty).  A procedure to put in a tube (stent) to keep a blocked artery open (stent implant).  Surgery to reroute blood flow around a blocked artery (peripheral bypass surgery).  Surgery to remove dead tissue from an infected wound on the affected limb.  Amputation. This is surgical removal of the affected limb. This may be necessary in cases of acute ischemic limb that are not improved through medical or surgical treatments.  Follow these instructions at home:  Take medicines only as directed by your health care provider.  Do not use any tobacco products, including cigarettes, chewing tobacco, or electronic cigarettes. If you need help quitting, ask your health care provider.  Lose weight if you are overweight, and maintain a healthy weight as directed by your health care provider.  Eat a diet that is low in fat and cholesterol. If you need help, ask your health care provider.  Exercise regularly. Ask your health care provider to suggest some good activities for you.  Use compression stockings or other mechanical devices as directed by your health care provider.  Take good care of your feet. ? Wear comfortable shoes that fit well. ? Check your feet  often for any cuts or sores. Contact a health care provider if:  You have cramps in your legs while walking.  You have leg pain when you are at rest.  You have coldness in a leg or foot.  Your skin changes.  You have erectile dysfunction.  You have cuts or sores on your feet that are not healing. Get help right away if:  Your arm or leg turns cold and blue.  Your arms or legs become red, warm, swollen, painful, or numb.  You have chest pain or trouble breathing.  You suddenly have weakness in your face, arm, or leg.  You become very confused or lose the ability to speak.  You suddenly have a very bad headache or lose your vision. This information is not intended to replace advice given to you by your health care provider. Make sure you discuss any questions you have with your health care provider. Document Released: 06/27/2004 Document Revised: 10/26/2015 Document Reviewed: 10/28/2013 Elsevier Interactive Patient Education  2017 [employer]  

## 2016-06-04 NOTE — Assessment & Plan Note (Signed)
Leg pain started after statin use so not able to take these. Hasn't atherosclerotic history of previous coronary disease so diet and exercise will be very important.

## 2016-06-04 NOTE — Assessment & Plan Note (Signed)
Although his lower extremity symptoms sound more neuropathic in nature, given his significant atherosclerotic risk factors and multiple comorbidities as well as a previous history of coronary disease, I think it is important to assess him for peripheral arterial disease as well. I have discussed the pathophysiology and natural history of peripheral arterial disease. We will obtain noninvasive studies in the near future at his convenience. I will see him back following these studies to discuss the results and determine further treatment options.

## 2016-06-26 ENCOUNTER — Ambulatory Visit (INDEPENDENT_AMBULATORY_CARE_PROVIDER_SITE_OTHER): Payer: Medicare Other

## 2016-06-26 ENCOUNTER — Encounter (INDEPENDENT_AMBULATORY_CARE_PROVIDER_SITE_OTHER): Payer: Self-pay | Admitting: Vascular Surgery

## 2016-06-26 ENCOUNTER — Ambulatory Visit (INDEPENDENT_AMBULATORY_CARE_PROVIDER_SITE_OTHER): Payer: Medicare Other | Admitting: Vascular Surgery

## 2016-06-26 ENCOUNTER — Encounter (INDEPENDENT_AMBULATORY_CARE_PROVIDER_SITE_OTHER): Payer: Self-pay

## 2016-06-26 VITALS — BP 136/76 | HR 69 | Resp 16 | Ht 68.0 in | Wt 212.0 lb

## 2016-06-26 DIAGNOSIS — M79605 Pain in left leg: Secondary | ICD-10-CM | POA: Diagnosis not present

## 2016-06-26 DIAGNOSIS — I739 Peripheral vascular disease, unspecified: Secondary | ICD-10-CM

## 2016-06-26 DIAGNOSIS — I10 Essential (primary) hypertension: Secondary | ICD-10-CM | POA: Diagnosis not present

## 2016-06-26 DIAGNOSIS — M79604 Pain in right leg: Secondary | ICD-10-CM

## 2016-06-26 DIAGNOSIS — E7800 Pure hypercholesterolemia, unspecified: Secondary | ICD-10-CM

## 2016-06-26 NOTE — Progress Notes (Signed)
Subjective:    Patient ID: [name] [name], male    DOB: May 20, 1946, 71 y.o.   MRN: [id number] Chief Complaint  Patient presents with  . Re-evaluation    Ultrasound follow up   Patient presents to review vascular studies. He was last seen on 06/04/16 for evaluation of peripheral arterial disease as a potential cause of his lower extremity pain. The patient reports years of worsening pain in his feet and lower legs. He has been treated unsuccessfully for neuropathy for many months now. Despite medications, his lower extremity symptoms seem to only be worsening. Both lower extremities are affected. He says that he started to notice the symptoms several years ago after the initiation of statin therapy for his hyperlipidemia. He has since stopped the statins due to intolerance, but his symptoms persist. He endorses a history of multiple spine surgery. He does not have ulceration or infection. He has no fever or chills or signs of lower extremity symptoms. He does not really describe typical claudication symptoms, says that sometimes getting up and moving his legs actually makes him feel better. An ABI was notable for no significant lower extremity arterial disease.    Review of Systems  Constitutional: Negative.   HENT: Negative.   Eyes: Negative.   Respiratory: Negative.   Cardiovascular:       Bilateral Lower Extremity Pain - Mostly Located In His Feet  Gastrointestinal: Negative.   Endocrine: Negative.   Genitourinary: Negative.   Musculoskeletal: Negative.   Skin: Negative.   Allergic/Immunologic: Negative.   Neurological: Negative.   Hematological: Negative.   Psychiatric/Behavioral: Negative.       Objective:   Physical Exam Gen:  WD/WN, NAD Head: [place]/AT, No temporalis wasting. Prominent temp pulse not noted. Ear/Nose/Throat: Hearing grossly intact, nares w/o erythema or drainage, oropharynx w/o Erythema/Exudate Eyes: Conjunctiva clear, sclera non-icteric  Neck: trachea midline.   No bruit or JVD.  Pulmonary:  Good air movement, clear to auscultation bilaterally.  Cardiac: RRR, normal S1, S2, no Murmurs, rubs or gallops. Vascular:  Vessel Right Left  Radial Palpable Palpable  Ulnar Palpable Palpable  Brachial Palpable Palpable  Carotid Palpable, without bruit Palpable, without bruit  Aorta Not palpable N/A  Femoral Palpable Palpable  Popliteal Palpable Palpable  PT Palpable Trace Palpable  DP 1+ Palpable Palpable   Gastrointestinal: soft, non-tender/non-distended. No guarding/reflex. No masses, surgical incisions, or scars. Musculoskeletal: M/S 5/5 throughout.  Extremities without ischemic changes.  No deformity or atrophy.  Neurologic: Sensation grossly intact in extremities.  Symmetrical.  Speech is fluent. Motor exam as listed above. Psychiatric: Judgment intact, Mood & affect appropriate for pt's clinical situation. Dermatologic: No rashes or ulcers noted.  No cellulitis or open wounds. Lymph : No Cervical, Axillary, or Inguinal lymphadenopathy.  BP 136/76 (BP Location: Right Arm)   Pulse 69   Resp 16   Ht 5\' 8"  (1.727 m)   Wt 212 lb (96.2 kg)   BMI 32.23 kg/m   Past Medical History:  Diagnosis Date  . Anginal pain (HCC)   . Arthritis   . Back pain    degenerative disc disease  . Carpal tunnel syndrome of right wrist   . CHF (congestive heart failure) (HCC)   . Coronary artery disease   . Diverticulosis   . GERD (gastroesophageal reflux disease)   . Myocardial infarction   . Neuropathy (HCC)   . Wears dentures    full upper and lower  . Wears hearing aid    bilateral  Social History   Social History  . Marital status: Married    Spouse name: N/A  . Number of children: N/A  . Years of education: N/A   Occupational History  . Not on file.   Social History Main Topics  . Smoking status: Former Smoker    Quit date: 06/18/2013  . Smokeless tobacco: Never Used  . Alcohol use 0.0 - 0.6 oz/week     Comment: occasionally  . Drug  use: No  . Sexual activity: Not on file   Other Topics Concern  . Not on file   Social History Narrative  . No narrative on file   Past Surgical History:  Procedure Laterality Date  . ANKLE RECONSTRUCTION Left 1991  . APPENDECTOMY    . BACK SURGERY N/A    lower back x 3  . CARDIAC CATHETERIZATION    . CARPAL TUNNEL RELEASE Left 03/2014   In Dr. Rosita Kea office  . CARPAL TUNNEL RELEASE Left 10/20/2014   Procedure: CARPAL TUNNEL RELEASE;  Surgeon: Kennedy Bucker, MD;  Location: [facility];  Service: Orthopedics;  Laterality: Left;  . CORONARY ARTERY BYPASS GRAFT  March 2014   [facility]  . ESOPHAGOGASTRODUODENOSCOPY (EGD) WITH PROPOFOL N/A 10/26/2015   Procedure: ESOPHAGOGASTRODUODENOSCOPY (EGD) WITH PROPOFOL with dialation;  Surgeon: [name], MD;  Location: [facility];  Service: Endoscopy;  Laterality: N/A;  . EYE SURGERY Bilateral    Cataract Extraction with IOL  . NECK SURGERY N/A    neck x 2, one discectomy and one shaved disc.  Marland Kitchen SHOULDER ARTHROSCOPY Right 06/06/2015   Procedure: right shoulder arthroscopy, arthroscopic debridement, subacromial decompression, SLAP repair, biotenodesis, mini open rotator cuff repair;  Surgeon: Christena Flake, MD;  Location: [facility];  Service: Orthopedics;  Laterality: Right;  . SHOULDER OPEN ROTATOR CUFF REPAIR     Left   No family history on file.  Allergies  Allergen Reactions  . Parafon Forte Dsc [Chlorzoxazone] Rash and Itching    severe  . Cucumber Extract Other (See Comments)    GI upset      Assessment & Plan:  Patient presents to review vascular studies. He was last seen on 06/04/16 for evaluation of peripheral arterial disease as a potential cause of his lower extremity pain. The patient reports years of worsening pain in his feet and lower legs. He has been treated unsuccessfully for neuropathy for many months now. Despite medications, his lower extremity symptoms seem to only be worsening. Both lower extremities are  affected. He says that he started to notice the symptoms several years ago after the initiation of statin therapy for his hyperlipidemia. He has since stopped the statins due to intolerance, but his symptoms persist. He endorses a history of multiple spine surgery. He does not have ulceration or infection. He has no fever or chills or signs of lower extremity symptoms. He does not really describe typical claudication symptoms, says that sometimes getting up and moving his legs actually makes him feel better. An ABI was notable for no significant lower extremity arterial disease.   1. PAD (peripheral artery disease) (HCC) - New No significant disease noted on ABI. Pain may be OA or DJD in nature. Encouraged follow up with PCP - possibly increase his Neurotin I have discussed with the patient at length the risk factors for and pathogenesis of atherosclerotic disease and encouraged a healthy diet, regular exercise regimen and blood pressure / glucose control.  The patient was encouraged to call the office in  the interim if he experiences any claudication like symptoms, rest pain or ulcers to his feet / toes.  - VAS [place] ABI WITH/WO TBI; Future  2. Hypercholesteremia - Stable Encouraged good control as its slows the progression of atherosclerotic disease  3. Essential hypertension - Stable Encouraged good control as its slows the progression of atherosclerotic disease  Current Outpatient Prescriptions on File Prior to Visit  Medication Sig Dispense Refill  . aspirin 81 MG tablet Take 81 mg by mouth every morning.    Marland Kitchen azithromycin (ZITHROMAX) 250 MG tablet 2 tabs po day 1, then 1 tab po q day x 4 days 6 tablet 0  . cyclobenzaprine (FLEXERIL) 10 MG tablet Take 1 tablet (10 mg total) by mouth 2 (two) times daily as needed. 180 tablet 0  . dicyclomine (BENTYL) 10 MG capsule Take 1 capsule (10 mg total) by mouth 2 (two) times daily. 180 capsule 2  . docusate sodium (COLACE) 100 MG capsule Take 100 mg by  mouth 2 (two) times daily.    . furosemide (LASIX) 20 MG tablet Take 1 tablet (20 mg total) by mouth daily. 90 tablet 0  . gabapentin (NEURONTIN) 300 MG capsule Take 1 capsule (300 mg total) by mouth 2 (two) times daily. 180 capsule 0  . isosorbide mononitrate (IMDUR) 30 MG 24 hr tablet     . nitroGLYCERIN (NITROSTAT) 0.4 MG SL tablet Place 1 tablet (0.4 mg total) under the tongue every 5 (five) minutes as needed for chest pain. Max 3 doses within 15 mins 30 tablet 0  . Oxycodone HCl 10 MG TABS Take 1 tablet (10 mg total) by mouth every 12 (twelve) hours as needed. 60 tablet 0  . pantoprazole (PROTONIX) 40 MG tablet Take 1 tablet (40 mg total) by mouth every morning. 90 tablet 1  . potassium chloride SA (KLOR-CON M20) 20 MEQ tablet Take 1 tablet (20 mEq total) by mouth daily. 90 tablet 1  . vitamin E (VITAMIN E) 400 UNIT capsule Take 400 Units by mouth daily.     No current facility-administered medications on file prior to visit.     There are no Patient Instructions on file for this visit. No Follow-up on file.   Jerrie Gullo A Marrah Vanevery, PA-C

## 2016-07-02 DIAGNOSIS — R05 Cough: Secondary | ICD-10-CM | POA: Diagnosis not present

## 2016-07-02 DIAGNOSIS — J181 Lobar pneumonia, unspecified organism: Secondary | ICD-10-CM | POA: Diagnosis not present

## 2016-07-04 DIAGNOSIS — J181 Lobar pneumonia, unspecified organism: Secondary | ICD-10-CM | POA: Diagnosis not present

## 2016-07-26 ENCOUNTER — Encounter: Payer: Self-pay | Admitting: Family Medicine

## 2016-07-26 ENCOUNTER — Ambulatory Visit (INDEPENDENT_AMBULATORY_CARE_PROVIDER_SITE_OTHER): Payer: Managed Care, Other (non HMO) | Admitting: Family Medicine

## 2016-07-26 DIAGNOSIS — M5441 Lumbago with sciatica, right side: Secondary | ICD-10-CM | POA: Diagnosis not present

## 2016-07-26 DIAGNOSIS — G8929 Other chronic pain: Secondary | ICD-10-CM

## 2016-07-26 DIAGNOSIS — G6289 Other specified polyneuropathies: Secondary | ICD-10-CM

## 2016-07-26 DIAGNOSIS — Z86018 Personal history of other benign neoplasm: Secondary | ICD-10-CM

## 2016-07-26 DIAGNOSIS — K219 Gastro-esophageal reflux disease without esophagitis: Secondary | ICD-10-CM | POA: Diagnosis not present

## 2016-07-26 MED ORDER — DICYCLOMINE HCL 10 MG PO CAPS: 10.0000 mg | ORAL_CAPSULE | Freq: Two times a day (BID) | ORAL | 0 refills | Status: DC

## 2016-07-26 MED ORDER — CYCLOBENZAPRINE HCL 10 MG PO TABS: 10.0000 mg | ORAL_TABLET | Freq: Two times a day (BID) | ORAL | 0 refills | Status: DC | PRN

## 2016-07-26 MED ORDER — GABAPENTIN 300 MG PO CAPS: 300.0000 mg | ORAL_CAPSULE | Freq: Two times a day (BID) | ORAL | 0 refills | Status: DC

## 2016-07-26 MED ORDER — PANTOPRAZOLE SODIUM 40 MG PO TBEC: 40.0000 mg | DELAYED_RELEASE_TABLET | Freq: Every morning | ORAL | 1 refills | Status: DC

## 2016-07-26 MED ORDER — OXYCODONE HCL 10 MG PO TABS: 10.0000 mg | ORAL_TABLET | Freq: Two times a day (BID) | ORAL | 0 refills | Status: DC | PRN

## 2016-07-26 NOTE — Progress Notes (Signed)
Name: [name] [name]   MRN: [id number]    DOB: 1946/01/25   Date:07/26/2016       Progress Note  Subjective  Chief Complaint  Chief Complaint  Patient presents with  . Medication Refill    HPI  Chronic Low Back Pain: Pt. Presents for medication follow up, has chronic low back pain, sometimes radiating down the right leg, controlled on Oxycodone 10 mg twice daily as needed along with Cyclobenzaprine 10 mg twice daily as needed for associated muscle spasm. Reports pain is rated between 2-3/10, worse with sitting for long periods of time. Pain is worse at night.  Esophageal Tumors:Pt. Has history of benign esophageal tumors, taking Bentyl 10 mg two times daily as needed. Reports no problems as long as he is on medication.  GERD: Pt. Has long-standing history of gastroesophageal reflux, worse with certain foods such as fried and greasy foods. He takes Pantoprazole 40 mg daily, which keeps his symptoms under control with only occasional heartburn. No dysphagia, blood in stool, or abdominal pain.  Peripheral Neuropathy: Pt. Has peripheral neuropathy, described as burning and tingling in his feet, worse at night. He takes Gabapentin 300 mg twice daily which seems to help relieve his symptoms. Neuropathy is thought to be from back surgeries.   Past Medical History:  Diagnosis Date  . Anginal pain (HCC)   . Arthritis   . Back pain    degenerative disc disease  . Carpal tunnel syndrome of right wrist   . CHF (congestive heart failure) (HCC)   . Coronary artery disease   . Diverticulosis   . GERD (gastroesophageal reflux disease)   . Myocardial infarction   . Neuropathy (HCC)   . Wears dentures    full upper and lower  . Wears hearing aid    bilateral    Past Surgical History:  Procedure Laterality Date  . ANKLE RECONSTRUCTION Left 1991  . APPENDECTOMY    . BACK SURGERY N/A    lower back x 3  . CARDIAC CATHETERIZATION    . CARPAL TUNNEL RELEASE Left 03/2014   In Dr.  Rosita Kea office  . CARPAL TUNNEL RELEASE Left 10/20/2014   Procedure: CARPAL TUNNEL RELEASE;  Surgeon: Kennedy Bucker, MD;  Location: [facility];  Service: Orthopedics;  Laterality: Left;  . CORONARY ARTERY BYPASS GRAFT  March 2014   [facility]  . ESOPHAGOGASTRODUODENOSCOPY (EGD) WITH PROPOFOL N/A 10/26/2015   Procedure: ESOPHAGOGASTRODUODENOSCOPY (EGD) WITH PROPOFOL with dialation;  Surgeon: [name], MD;  Location: [facility];  Service: Endoscopy;  Laterality: N/A;  . EYE SURGERY Bilateral    Cataract Extraction with IOL  . NECK SURGERY N/A    neck x 2, one discectomy and one shaved disc.  Marland Kitchen SHOULDER ARTHROSCOPY Right 06/06/2015   Procedure: right shoulder arthroscopy, arthroscopic debridement, subacromial decompression, SLAP repair, biotenodesis, mini open rotator cuff repair;  Surgeon: Christena Flake, MD;  Location: [facility];  Service: Orthopedics;  Laterality: Right;  . SHOULDER OPEN ROTATOR CUFF REPAIR     Left    No family history on file.  Social History   Social History  . Marital status: Married    Spouse name: N/A  . Number of children: N/A  . Years of education: N/A   Occupational History  . Not on file.   Social History Main Topics  . Smoking status: Former Smoker    Quit date: 06/18/2013  . Smokeless tobacco: Never Used  . Alcohol use 0.0 - 0.6 oz/week  Comment: occasionally  . Drug use: No  . Sexual activity: Not on file   Other Topics Concern  . Not on file   Social History Narrative  . No narrative on file     Current Outpatient Prescriptions:  .  aspirin 81 MG tablet, Take 81 mg by mouth every morning., Disp: , Rfl:  .  dicyclomine (BENTYL) 10 MG capsule, Take 1 capsule (10 mg total) by mouth 2 (two) times daily., Disp: 180 capsule, Rfl: 2 .  docusate sodium (COLACE) 100 MG capsule, Take 100 mg by mouth 2 (two) times daily., Disp: , Rfl:  .  furosemide (LASIX) 20 MG tablet, Take 1 tablet (20 mg total) by mouth daily., Disp: 90 tablet, Rfl:  0 .  gabapentin (NEURONTIN) 300 MG capsule, Take 1 capsule (300 mg total) by mouth 2 (two) times daily., Disp: 180 capsule, Rfl: 0 .  isosorbide mononitrate (IMDUR) 30 MG 24 hr tablet, , Disp: , Rfl:  .  nitroGLYCERIN (NITROSTAT) 0.4 MG SL tablet, Place 1 tablet (0.4 mg total) under the tongue every 5 (five) minutes as needed for chest pain. Max 3 doses within 15 mins, Disp: 30 tablet, Rfl: 0 .  Oxycodone HCl 10 MG TABS, Take 1 tablet (10 mg total) by mouth every 12 (twelve) hours as needed., Disp: 60 tablet, Rfl: 0 .  pantoprazole (PROTONIX) 40 MG tablet, Take 1 tablet (40 mg total) by mouth every morning., Disp: 90 tablet, Rfl: 1 .  potassium chloride SA (KLOR-CON M20) 20 MEQ tablet, Take 1 tablet (20 mEq total) by mouth daily., Disp: 90 tablet, Rfl: 1 .  vitamin E (VITAMIN E) 400 UNIT capsule, Take 400 Units by mouth daily., Disp: , Rfl:  .  cyclobenzaprine (FLEXERIL) 10 MG tablet, Take 1 tablet (10 mg total) by mouth 2 (two) times daily as needed. (Patient not taking: Reported on 07/26/2016), Disp: 180 tablet, Rfl: 0  Allergies  Allergen Reactions  . Parafon Forte Dsc [Chlorzoxazone] Rash and Itching    severe  . Cucumber Extract Other (See Comments)    GI upset     ROS  Please see HPI for complete discussion of ROS.  Objective  Vitals:   07/26/16 0821  BP: 130/72  Pulse: 79  Resp: 18  Temp: 97.9 F (36.6 C)  TempSrc: Oral  SpO2: 95%  Weight: 210 lb 9.6 oz (95.5 kg)  Height: 5\' 8"  (1.727 m)    Physical Exam  Constitutional: He is oriented to person, place, and time and well-developed, well-nourished, and in no distress.  HENT:  Head: Normocephalic and atraumatic.  Cardiovascular: Normal rate, regular rhythm and normal heart sounds.   No murmur heard. Pulmonary/Chest: Effort normal and breath sounds normal. He has no wheezes.  Abdominal: Soft. Bowel sounds are normal.  Musculoskeletal:       Lumbar back: He exhibits tenderness and pain.       Back:  Neurological:  He is alert and oriented to person, place, and time.  Psychiatric: Mood, memory, affect and judgment normal.  Nursing note and vitals reviewed.     Assessment & Plan  1. Chronic right-sided low back pain with right-sided sciatica Stable and responsive to opioid therapy, cyclobenzaprine helps associated muscle spasm - cyclobenzaprine (FLEXERIL) 10 MG tablet; Take 1 tablet (10 mg total) by mouth 2 (two) times daily as needed.  Dispense: 180 tablet; Refill: 0 - Oxycodone HCl 10 MG TABS; Take 1 tablet (10 mg total) by mouth every 12 (twelve) hours as needed.  Dispense:  60 tablet; Refill: 0  2. Gastroesophageal reflux disease, esophagitis presence not specified Symptoms controlled on PPI - pantoprazole (PROTONIX) 40 MG tablet; Take 1 tablet (40 mg total) by mouth every morning.  Dispense: 90 tablet; Refill: 1  3. History of benign esophageal tumor According to recent endoscopy from May 2017, he has no esophageal tumors. Patient would like to continue on dicyclomine as prescribed, refills provided - dicyclomine (BENTYL) 10 MG capsule; Take 1 capsule (10 mg total) by mouth 2 (two) times daily.  Dispense: 180 capsule; Refill: 0  4. Other polyneuropathy (HCC)  - gabapentin (NEURONTIN) 300 MG capsule; Take 1 capsule (300 mg total) by mouth 2 (two) times daily.  Dispense: 180 capsule; Refill: 0   [place] Asad A. Faylene Kurtz [facility] 07/26/2016 8:31 AM

## 2016-09-27 ENCOUNTER — Encounter: Payer: Self-pay | Admitting: Family Medicine

## 2016-09-27 ENCOUNTER — Ambulatory Visit (INDEPENDENT_AMBULATORY_CARE_PROVIDER_SITE_OTHER): Payer: Managed Care, Other (non HMO) | Admitting: Family Medicine

## 2016-09-27 DIAGNOSIS — G6289 Other specified polyneuropathies: Secondary | ICD-10-CM

## 2016-09-27 DIAGNOSIS — G8929 Other chronic pain: Secondary | ICD-10-CM

## 2016-09-27 DIAGNOSIS — Z86018 Personal history of other benign neoplasm: Secondary | ICD-10-CM | POA: Diagnosis not present

## 2016-09-27 DIAGNOSIS — M5441 Lumbago with sciatica, right side: Secondary | ICD-10-CM

## 2016-09-27 DIAGNOSIS — K219 Gastro-esophageal reflux disease without esophagitis: Secondary | ICD-10-CM

## 2016-09-27 MED ORDER — OXYCODONE HCL 10 MG PO TABS: 10.0000 mg | ORAL_TABLET | Freq: Two times a day (BID) | ORAL | 0 refills | Status: DC | PRN

## 2016-09-27 MED ORDER — PANTOPRAZOLE SODIUM 40 MG PO TBEC: 40.0000 mg | DELAYED_RELEASE_TABLET | Freq: Every morning | ORAL | 1 refills | Status: DC

## 2016-09-27 MED ORDER — GABAPENTIN 300 MG PO CAPS: 300.0000 mg | ORAL_CAPSULE | Freq: Two times a day (BID) | ORAL | 0 refills | Status: DC

## 2016-09-27 MED ORDER — CYCLOBENZAPRINE HCL 10 MG PO TABS: 10.0000 mg | ORAL_TABLET | Freq: Two times a day (BID) | ORAL | 0 refills | Status: DC | PRN

## 2016-09-27 MED ORDER — DICYCLOMINE HCL 10 MG PO CAPS: 10.0000 mg | ORAL_CAPSULE | Freq: Two times a day (BID) | ORAL | 0 refills | Status: DC

## 2016-09-27 NOTE — Progress Notes (Signed)
Name: [name] [name]   MRN: [id number]    DOB: 1945-12-11   Date:09/27/2016       Progress Note  Subjective  Chief Complaint  Chief Complaint  Patient presents with  . Medication Refill    Pantoprazole (40mg ), Dycyclomine (10mg ), Cyclobenzaprene (10mg ), Gabapentin (300mg ), Oxycodone (10mg )    HPI  Chronic Low Back Pain: Pt. Presents for medication follow up, has chronic low back pain, sometimes radiating down the right leg, controlled on Oxycodone 10 mg twice daily as needed along with Cyclobenzaprine 10 mg twice daily as needed for associated muscle spasm. Reports pain fluctuates in intensity, worse with sitting for long periods of time.Recently patient has gotten worse especially at night, last night he had to take a second pill of cyclobenzaprine and normally only takes 1  Esophageal Tumors: Pt. Has history of benign esophageal tumors, taking Bentyl 10 mg two times daily as needed. Reports no problems as long as he is on medication but initial symptoms included dysphagia which led to endoscopy and the subsequent findings.  GERD: Pt. Has long-standing history of gastroesophageal reflux, worse with certain foods such as fried and greasy foods. He takes Pantoprazole 40 mg daily, which keeps his symptoms under control. No dysphagia, blood in stool, or abdominal pain.  Peripheral Neuropathy: Patient has peripheral neuropathy with burning pain and tingling in his feet, thought to be coming from his lower back. 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 He takes Gabapentin 300 mg twice daily, this seems to help improve his pain.      Past Medical History:  Diagnosis Date  . Anginal pain (HCC)   . Arthritis   . Back pain    degenerative disc disease  . Carpal tunnel syndrome of right wrist   . CHF (congestive heart failure) (HCC)   . Coronary artery disease   . Diverticulosis   . GERD (gastroesophageal reflux disease)   . Myocardial  infarction (HCC)   . Neuropathy   . Wears dentures    full upper and lower  . Wears hearing aid    bilateral    Past Surgical History:  Procedure Laterality Date  . ANKLE RECONSTRUCTION Left 1991  . APPENDECTOMY    . BACK SURGERY N/A    lower back x 3  . CARDIAC CATHETERIZATION    . CARPAL TUNNEL RELEASE Left 03/2014   In Dr. Rosita Kea office  . CARPAL TUNNEL RELEASE Left 10/20/2014   Procedure: CARPAL TUNNEL RELEASE;  Surgeon: Kennedy Bucker, MD;  Location: [facility];  Service: Orthopedics;  Laterality: Left;  . CORONARY ARTERY BYPASS GRAFT  March 2014   [facility]  . ESOPHAGOGASTRODUODENOSCOPY (EGD) WITH PROPOFOL N/A 10/26/2015   Procedure: ESOPHAGOGASTRODUODENOSCOPY (EGD) WITH PROPOFOL with dialation;  Surgeon: [name], MD;  Location: [facility];  Service: Endoscopy;  Laterality: N/A;  . EYE SURGERY Bilateral    Cataract Extraction with IOL  . NECK SURGERY N/A    neck x 2, one discectomy and one shaved disc.  Marland Kitchen SHOULDER ARTHROSCOPY Right 06/06/2015   Procedure: right shoulder arthroscopy, arthroscopic debridement, subacromial decompression, SLAP repair, biotenodesis, mini open rotator cuff repair;  Surgeon: Christena Flake, MD;  Location: [facility];  Service: Orthopedics;  Laterality: Right;  . SHOULDER OPEN ROTATOR CUFF REPAIR     Left    No family history on file.  Social History   Social History  . Marital status: Married    Spouse name: N/A  .  Number of children: N/A  . Years of education: N/A   Occupational History  . Not on file.   Social History Main Topics  . Smoking status: Former Smoker    Quit date: 06/18/2013  . Smokeless tobacco: Never Used  . Alcohol use 0.0 - 0.6 oz/week     Comment: occasionally  . Drug use: No  . Sexual activity: Yes   Other Topics Concern  . Not on file   Social History Narrative  . No narrative on file     Current Outpatient Prescriptions:  .  aspirin 81 MG tablet, Take 81 mg by mouth every morning., Disp: ,  Rfl:  .  cyclobenzaprine (FLEXERIL) 10 MG tablet, Take 1 tablet (10 mg total) by mouth 2 (two) times daily as needed., Disp: 180 tablet, Rfl: 0 .  dicyclomine (BENTYL) 10 MG capsule, Take 1 capsule (10 mg total) by mouth 2 (two) times daily., Disp: 180 capsule, Rfl: 0 .  docusate sodium (COLACE) 100 MG capsule, Take 100 mg by mouth 2 (two) times daily., Disp: , Rfl:  .  gabapentin (NEURONTIN) 300 MG capsule, Take 1 capsule (300 mg total) by mouth 2 (two) times daily., Disp: 180 capsule, Rfl: 0 .  isosorbide mononitrate (IMDUR) 30 MG 24 hr tablet, , Disp: , Rfl:  .  Oxycodone HCl 10 MG TABS, Take 1 tablet (10 mg total) by mouth every 12 (twelve) hours as needed., Disp: 60 tablet, Rfl: 0 .  pantoprazole (PROTONIX) 40 MG tablet, Take 1 tablet (40 mg total) by mouth every morning., Disp: 90 tablet, Rfl: 1 .  nitroGLYCERIN (NITROSTAT) 0.4 MG SL tablet, Place 1 tablet (0.4 mg total) under the tongue every 5 (five) minutes as needed for chest pain. Max 3 doses within 15 mins (Patient not taking: Reported on 09/27/2016), Disp: 30 tablet, Rfl: 0  Allergies  Allergen Reactions  . Parafon Forte Dsc [Chlorzoxazone] Rash and Itching    severe  . Cucumber Extract Other (See Comments)    GI upset     ROS  Please see history of present illness for complete discussion of ROS  Objective  Vitals:   09/27/16 1041  BP: 128/70  Pulse: (!) 55  Resp: 14  Temp: 97.4 F (36.3 C)  TempSrc: Oral  SpO2: 99%  Weight: 211 lb 3.2 oz (95.8 kg)  Height: 5\' 8"  (1.727 m)    Physical Exam  Constitutional: He is oriented to person, place, and time and well-developed, well-nourished, and in no distress.  HENT:  Head: Normocephalic and atraumatic.  Cardiovascular: Normal rate, regular rhythm and normal heart sounds.   No murmur heard. Pulmonary/Chest: Effort normal and breath sounds normal. He has no wheezes.  Abdominal: Soft. Bowel sounds are normal.  Musculoskeletal: He exhibits no edema.       Lumbar back:  He exhibits tenderness and pain.       Back:  Neurological: He is alert and oriented to person, place, and time.  Psychiatric: Mood, memory, affect and judgment normal.  Nursing note and vitals reviewed.     Assessment & Plan  1. Chronic right-sided low back pain with right-sided sciatica Stable but acutely worse with associated muscle spasm and legs, may take up to 3 tablets of cyclobenzaprine if possible and tolerated - cyclobenzaprine (FLEXERIL) 10 MG tablet; Take 1 tablet (10 mg total) by mouth 2 (two) times daily as needed.  Dispense: 180 tablet; Refill: 0 - Oxycodone HCl 10 MG TABS; Take 1 tablet (10 mg total) by mouth  every 12 (twelve) hours as needed.  Dispense: 60 tablet; Refill: 0  2. Gastroesophageal reflux disease, esophagitis presence not specified  - pantoprazole (PROTONIX) 40 MG tablet; Take 1 tablet (40 mg total) by mouth every morning.  Dispense: 90 tablet; Refill: 1  3. History of benign esophageal tumor  - dicyclomine (BENTYL) 10 MG capsule; Take 1 capsule (10 mg total) by mouth 2 (two) times daily.  Dispense: 180 capsule; Refill: 0  4. Other polyneuropathy Likely because of degenerative disc disease in lower back, continue gabapentin as prescribed - gabapentin (NEURONTIN) 300 MG capsule; Take 1 capsule (300 mg total) by mouth 2 (two) times daily.  Dispense: 180 capsule; Refill: 0   Jahdai Padovano Asad A. Faylene Kurtz Medical [facility] 09/27/2016 10:51 AM

## 2016-10-23 ENCOUNTER — Ambulatory Visit: Payer: Medicare Other | Admitting: Family Medicine

## 2016-11-27 ENCOUNTER — Encounter: Payer: Medicare Other | Admitting: Family Medicine

## 2016-11-28 ENCOUNTER — Encounter: Payer: Self-pay | Admitting: Family Medicine

## 2016-11-28 ENCOUNTER — Ambulatory Visit (INDEPENDENT_AMBULATORY_CARE_PROVIDER_SITE_OTHER): Payer: Managed Care, Other (non HMO) | Admitting: Family Medicine

## 2016-11-28 VITALS — BP 122/66 | HR 85 | Temp 97.7°F | Resp 16 | Ht 68.0 in | Wt 208.0 lb

## 2016-11-28 DIAGNOSIS — M5441 Lumbago with sciatica, right side: Secondary | ICD-10-CM | POA: Diagnosis not present

## 2016-11-28 DIAGNOSIS — E669 Obesity, unspecified: Secondary | ICD-10-CM | POA: Diagnosis not present

## 2016-11-28 DIAGNOSIS — R739 Hyperglycemia, unspecified: Secondary | ICD-10-CM

## 2016-11-28 DIAGNOSIS — G8929 Other chronic pain: Secondary | ICD-10-CM | POA: Diagnosis not present

## 2016-11-28 LAB — POCT GLYCOSYLATED HEMOGLOBIN (HGB A1C): HEMOGLOBIN A1C: 5.7

## 2016-11-28 MED ORDER — OXYCODONE HCL 10 MG PO TABS: 10.0000 mg | ORAL_TABLET | Freq: Two times a day (BID) | ORAL | 0 refills | Status: DC | PRN

## 2016-11-28 NOTE — Progress Notes (Signed)
Name: [name] [name]   MRN: [id number]    DOB: 01/18/1946   Date:11/28/2016       Progress Note  Subjective  Chief Complaint  Chief Complaint  Patient presents with  . Annual Exam    Back Pain  This is a chronic problem. The problem occurs constantly. The problem is unchanged. The pain is present in the lumbar spine. The pain radiates to the left thigh. The pain is at a severity of 4/10. The pain is moderate. The symptoms are aggravated by bending, position, standing and sitting (by the end of the day, his back pain gets worse). Pertinent negatives include no bladder incontinence, bowel incontinence, leg pain or numbness. He has tried analgesics for the symptoms.   Obesity:  Patient is considered obese with BMI of 31.63 kg/m2, he has cut down on a lot of fried and fatty foods, he is moderatley active at work, trying to achieve a healthy weight.     Past Medical History:  Diagnosis Date  . Anginal pain (HCC)   . Arthritis   . Back pain    degenerative disc disease  . Carpal tunnel syndrome of right wrist   . CHF (congestive heart failure) (HCC)   . Coronary artery disease   . Diverticulosis   . GERD (gastroesophageal reflux disease)   . Myocardial infarction (HCC)   . Neuropathy   . Wears dentures    full upper and lower  . Wears hearing aid    bilateral    Past Surgical History:  Procedure Laterality Date  . ANKLE RECONSTRUCTION Left 1991  . APPENDECTOMY    . BACK SURGERY N/A    lower back x 3  . CARDIAC CATHETERIZATION    . CARPAL TUNNEL RELEASE Left 03/2014   In Dr. Rosita Kea office  . CARPAL TUNNEL RELEASE Left 10/20/2014   Procedure: CARPAL TUNNEL RELEASE;  Surgeon: Kennedy Bucker, MD;  Location: [facility];  Service: Orthopedics;  Laterality: Left;  . CORONARY ARTERY BYPASS GRAFT  March 2014   [facility]  . ESOPHAGOGASTRODUODENOSCOPY (EGD) WITH PROPOFOL N/A 10/26/2015   Procedure: ESOPHAGOGASTRODUODENOSCOPY (EGD) WITH PROPOFOL with dialation;  Surgeon: [name], MD;  Location: [facility];  Service: Endoscopy;  Laterality: N/A;  . EYE SURGERY Bilateral    Cataract Extraction with IOL  . NECK SURGERY N/A    neck x 2, one discectomy and one shaved disc.  Marland Kitchen SHOULDER ARTHROSCOPY Right 06/06/2015   Procedure: right shoulder arthroscopy, arthroscopic debridement, subacromial decompression, SLAP repair, biotenodesis, mini open rotator cuff repair;  Surgeon: Christena Flake, MD;  Location: [facility];  Service: Orthopedics;  Laterality: Right;  . SHOULDER OPEN ROTATOR CUFF REPAIR     Left    No family history on file.  Social History   Social History  . Marital status: Married    Spouse name: N/A  . Number of children: N/A  . Years of education: N/A   Occupational History  . Not on file.   Social History Main Topics  . Smoking status: Former Smoker    Quit date: 06/18/2013  . Smokeless tobacco: Never Used  . Alcohol use 0.0 - 0.6 oz/week     Comment: occasionally  . Drug use: No  . Sexual activity: Yes   Other Topics Concern  . Not on file   Social History Narrative  . No narrative on file     Current Outpatient Prescriptions:  .  aspirin 81 MG tablet, Take 81 mg by  mouth every morning., Disp: , Rfl:  .  cyclobenzaprine (FLEXERIL) 10 MG tablet, Take 1 tablet (10 mg total) by mouth 2 (two) times daily as needed., Disp: 180 tablet, Rfl: 0 .  dicyclomine (BENTYL) 10 MG capsule, Take 1 capsule (10 mg total) by mouth 2 (two) times daily., Disp: 180 capsule, Rfl: 0 .  docusate sodium (COLACE) 100 MG capsule, Take 100 mg by mouth 2 (two) times daily., Disp: , Rfl:  .  gabapentin (NEURONTIN) 300 MG capsule, Take 1 capsule (300 mg total) by mouth 2 (two) times daily., Disp: 180 capsule, Rfl: 0 .  isosorbide mononitrate (IMDUR) 30 MG 24 hr tablet, , Disp: , Rfl:  .  nitroGLYCERIN (NITROSTAT) 0.4 MG SL tablet, Place 1 tablet (0.4 mg total) under the tongue every 5 (five) minutes as needed for chest pain. Max 3 doses within 15 mins (Patient  not taking: Reported on 09/27/2016), Disp: 30 tablet, Rfl: 0 .  Oxycodone HCl 10 MG TABS, Take 1 tablet (10 mg total) by mouth every 12 (twelve) hours as needed., Disp: 60 tablet, Rfl: 0 .  pantoprazole (PROTONIX) 40 MG tablet, Take 1 tablet (40 mg total) by mouth every morning., Disp: 90 tablet, Rfl: 1  Allergies  Allergen Reactions  . Parafon Forte Dsc [Chlorzoxazone] Rash and Itching    severe  . Cucumber Extract Other (See Comments)    GI upset     Review of Systems  Gastrointestinal: Negative for bowel incontinence.  Genitourinary: Negative for bladder incontinence.  Musculoskeletal: Positive for back pain.  Neurological: Negative for numbness.      Objective  Vitals:   11/28/16 0944  BP: 122/66  Pulse: 85  Resp: 16  Temp: 97.7 F (36.5 C)  TempSrc: Oral  SpO2: 99%  Weight: 208 lb (94.3 kg)  Height: 5\' 8"  (1.727 m)    Physical Exam  Constitutional: He is oriented to person, place, and time and well-developed, well-nourished, and in no distress.  Cardiovascular: Normal rate, regular rhythm and normal heart sounds.   No murmur heard. Pulmonary/Chest: Effort normal and breath sounds normal. He has no wheezes.  Musculoskeletal:       Lumbar back: He exhibits tenderness, pain and spasm.       Back:  Neurological: He is alert and oriented to person, place, and time.  Psychiatric: Mood, memory, affect and judgment normal.  Nursing note and vitals reviewed.      Assessment & Plan 1. Chronic right-sided low back pain with right-sided sciatica Stable and responsive to opioids, worse with activity, patient compliant with controlled substances agreement. Refills provided - Oxycodone HCl 10 MG TABS; Take 1 tablet (10 mg total) by mouth every 12 (twelve) hours as needed.  Dispense: 60 tablet; Refill: 0  2. Hyperglycemia  A1c is 5.7% - POCT glycosylated hemoglobin (Hb A1C)  3. Obesity (BMI 30.0-34.9) Encouraged to continue to work towards achieving a healthy body  weight, be more physically active and institute dietary changes.    Karlyn Glasco Asad A. Faylene Kurtz Medical [facility] 11/28/2016 9:50 AM

## 2017-01-14 DIAGNOSIS — G8929 Other chronic pain: Secondary | ICD-10-CM | POA: Diagnosis not present

## 2017-01-14 DIAGNOSIS — M5442 Lumbago with sciatica, left side: Secondary | ICD-10-CM | POA: Diagnosis not present

## 2017-01-14 DIAGNOSIS — M5136 Other intervertebral disc degeneration, lumbar region: Secondary | ICD-10-CM | POA: Diagnosis not present

## 2017-01-20 ENCOUNTER — Telehealth: Payer: Self-pay | Admitting: Family Medicine

## 2017-01-20 DIAGNOSIS — M5441 Lumbago with sciatica, right side: Principal | ICD-10-CM

## 2017-01-20 DIAGNOSIS — G8929 Other chronic pain: Secondary | ICD-10-CM

## 2017-01-20 MED ORDER — OXYCODONE HCL 10 MG PO TABS: 10.0000 mg | ORAL_TABLET | Freq: Two times a day (BID) | ORAL | 0 refills | Status: DC | PRN

## 2017-01-20 NOTE — Telephone Encounter (Signed)
PT IS ASKING FOR REFILL ON OXYCODONE 10MG .

## 2017-01-20 NOTE — Telephone Encounter (Signed)
PT WIFE NOTIFIED

## 2017-01-20 NOTE — Telephone Encounter (Signed)
Prescription for oxycodone 10 mg ready for pickup

## 2017-02-01 ENCOUNTER — Other Ambulatory Visit: Payer: Self-pay | Admitting: Family Medicine

## 2017-02-01 DIAGNOSIS — G8929 Other chronic pain: Secondary | ICD-10-CM

## 2017-02-01 DIAGNOSIS — M5441 Lumbago with sciatica, right side: Principal | ICD-10-CM

## 2017-03-26 ENCOUNTER — Ambulatory Visit: Payer: Medicare Other | Admitting: Family Medicine

## 2017-03-27 ENCOUNTER — Encounter: Payer: Medicare Other | Admitting: Family Medicine

## 2017-03-28 ENCOUNTER — Encounter: Payer: Self-pay | Admitting: Family Medicine

## 2017-03-28 ENCOUNTER — Ambulatory Visit (INDEPENDENT_AMBULATORY_CARE_PROVIDER_SITE_OTHER): Payer: Medicare Other | Admitting: Family Medicine

## 2017-03-28 VITALS — BP 126/68 | HR 64 | Temp 97.6°F | Resp 16 | Ht 68.0 in | Wt 208.2 lb

## 2017-03-28 DIAGNOSIS — M5441 Lumbago with sciatica, right side: Secondary | ICD-10-CM | POA: Diagnosis not present

## 2017-03-28 DIAGNOSIS — Z86018 Personal history of other benign neoplasm: Secondary | ICD-10-CM | POA: Diagnosis not present

## 2017-03-28 DIAGNOSIS — K219 Gastro-esophageal reflux disease without esophagitis: Secondary | ICD-10-CM

## 2017-03-28 DIAGNOSIS — G6289 Other specified polyneuropathies: Secondary | ICD-10-CM | POA: Diagnosis not present

## 2017-03-28 DIAGNOSIS — G8929 Other chronic pain: Secondary | ICD-10-CM

## 2017-03-28 MED ORDER — CYCLOBENZAPRINE HCL 10 MG PO TABS: 10.0000 mg | ORAL_TABLET | Freq: Two times a day (BID) | ORAL | 0 refills | Status: DC | PRN

## 2017-03-28 MED ORDER — OXYCODONE HCL 10 MG PO TABS: 10.0000 mg | ORAL_TABLET | Freq: Two times a day (BID) | ORAL | 0 refills | Status: DC | PRN

## 2017-03-28 MED ORDER — PANTOPRAZOLE SODIUM 40 MG PO TBEC: 40.0000 mg | DELAYED_RELEASE_TABLET | Freq: Every morning | ORAL | 1 refills | Status: AC

## 2017-03-28 MED ORDER — DICYCLOMINE HCL 10 MG PO CAPS: 10.0000 mg | ORAL_CAPSULE | Freq: Two times a day (BID) | ORAL | 0 refills | Status: DC

## 2017-03-28 MED ORDER — GABAPENTIN 300 MG PO CAPS: 300.0000 mg | ORAL_CAPSULE | Freq: Two times a day (BID) | ORAL | 0 refills | Status: DC

## 2017-03-28 NOTE — Progress Notes (Signed)
Name: [name] [name]   MRN: [id number]    DOB: Mar 17, 1946   Date:03/28/2017       Progress Note  Subjective  Chief Complaint  Chief Complaint  Patient presents with  . Medication Refill    all meds  . paperwork    Back Pain  This is a chronic problem. The problem occurs constantly. The problem has been gradually worsening (worse with changing weather) since onset. The pain is present in the lumbar spine. The pain is at a severity of 6/10. The pain is moderate. The symptoms are aggravated by bending and position. Pertinent negatives include no abdominal pain, bladder incontinence, bowel incontinence, chest pain, leg pain or numbness. He has tried analgesics and muscle relaxant for the symptoms. The treatment provided significant relief.  Gastroesophageal Reflux  He reports no abdominal pain, no belching, no chest pain, no hoarse voice or no nausea. This is a chronic problem. The problem has been unchanged. The symptoms are aggravated by certain foods. He has tried a PPI for the symptoms. Past procedures do not include an EGD.   He has history of polyneuropathy affecting his feet, has burning and tingling in both feet, seems to be associated with his lower back. He takes Gabapentin 300 mg BID for relief.     Past Medical History:  Diagnosis Date  . Anginal pain (HCC)   . Arthritis   . Back pain    degenerative disc disease  . Carpal tunnel syndrome of right wrist   . CHF (congestive heart failure) (HCC)   . Coronary artery disease   . Diverticulosis   . GERD (gastroesophageal reflux disease)   . Myocardial infarction (HCC)   . Neuropathy   . Wears dentures    full upper and lower  . Wears hearing aid    bilateral    Past Surgical History:  Procedure Laterality Date  . ANKLE RECONSTRUCTION Left 1991  . APPENDECTOMY    . BACK SURGERY N/A    lower back x 3  . CARDIAC CATHETERIZATION    . CARPAL TUNNEL RELEASE Left 03/2014   In Dr. Rosita Kea office  . CARPAL TUNNEL RELEASE  Left 10/20/2014   Procedure: CARPAL TUNNEL RELEASE;  Surgeon: Kennedy Bucker, MD;  Location: [facility];  Service: Orthopedics;  Laterality: Left;  . CORONARY ARTERY BYPASS GRAFT  March 2014   [facility]  . ESOPHAGOGASTRODUODENOSCOPY (EGD) WITH PROPOFOL N/A 10/26/2015   Procedure: ESOPHAGOGASTRODUODENOSCOPY (EGD) WITH PROPOFOL with dialation;  Surgeon: [name], MD;  Location: [facility];  Service: Endoscopy;  Laterality: N/A;  . EYE SURGERY Bilateral    Cataract Extraction with IOL  . NECK SURGERY N/A    neck x 2, one discectomy and one shaved disc.  Marland Kitchen SHOULDER ARTHROSCOPY Right 06/06/2015   Procedure: right shoulder arthroscopy, arthroscopic debridement, subacromial decompression, SLAP repair, biotenodesis, mini open rotator cuff repair;  Surgeon: Christena Flake, MD;  Location: [facility];  Service: Orthopedics;  Laterality: Right;  . SHOULDER OPEN ROTATOR CUFF REPAIR     Left    No family history on file.  Social History   Social History  . Marital status: Married    Spouse name: N/A  . Number of children: N/A  . Years of education: N/A   Occupational History  . Not on file.   Social History Main Topics  . Smoking status: Former Smoker    Quit date: 06/18/2013  . Smokeless tobacco: Never Used  . Alcohol use 0.0 - 0.6  oz/week     Comment: occasionally  . Drug use: No  . Sexual activity: Yes   Other Topics Concern  . Not on file   Social History Narrative  . No narrative on file     Current Outpatient Prescriptions:  .  aspirin 81 MG tablet, Take 81 mg by mouth every morning., Disp: , Rfl:  .  cyclobenzaprine (FLEXERIL) 10 MG tablet, Take 1 tablet (10 mg total) by mouth 2 (two) times daily as needed., Disp: 180 tablet, Rfl: 0 .  dicyclomine (BENTYL) 10 MG capsule, Take 1 capsule (10 mg total) by mouth 2 (two) times daily., Disp: 180 capsule, Rfl: 0 .  docusate sodium (COLACE) 100 MG capsule, Take 100 mg by mouth 2 (two) times daily., Disp: , Rfl:  .   gabapentin (NEURONTIN) 300 MG capsule, Take 1 capsule (300 mg total) by mouth 2 (two) times daily., Disp: 180 capsule, Rfl: 0 .  isosorbide mononitrate (IMDUR) 30 MG 24 hr tablet, , Disp: , Rfl:  .  nitroGLYCERIN (NITROSTAT) 0.4 MG SL tablet, Place 1 tablet (0.4 mg total) under the tongue every 5 (five) minutes as needed for chest pain. Max 3 doses within 15 mins, Disp: 30 tablet, Rfl: 0 .  Oxycodone HCl 10 MG TABS, Take 1 tablet (10 mg total) by mouth every 12 (twelve) hours as needed., Disp: 60 tablet, Rfl: 0 .  pantoprazole (PROTONIX) 40 MG tablet, Take 1 tablet (40 mg total) by mouth every morning., Disp: 90 tablet, Rfl: 1 .  gabapentin (NEURONTIN) 300 MG capsule, TAKE ONE CAPSULE BY MOUTH TWICE DAILY (Patient not taking: Reported on 03/28/2017), Disp: 180 capsule, Rfl: 0  Allergies  Allergen Reactions  . Parafon Forte Dsc [Chlorzoxazone] Rash and Itching    severe  . Cucumber Extract Other (See Comments)    GI upset     Review of Systems  HENT: Negative for hoarse voice.   Cardiovascular: Negative for chest pain.  Gastrointestinal: Negative for abdominal pain, bowel incontinence and nausea.  Genitourinary: Negative for bladder incontinence.  Musculoskeletal: Positive for back pain.  Neurological: Negative for numbness.      Objective  Vitals:   03/28/17 0909  BP: 126/68  Pulse: 64  Resp: 16  Temp: 97.6 F (36.4 C)  TempSrc: Oral  SpO2: 93%  Weight: 208 lb 3.2 oz (94.4 kg)  Height: 5\' 8"  (1.727 m)    Physical Exam  Constitutional: He is oriented to person, place, and time and well-developed, well-nourished, and in no distress.  HENT:  Head: Normocephalic and atraumatic.  Cardiovascular: Normal rate, regular rhythm and normal heart sounds.   No murmur heard. Pulmonary/Chest: Effort normal and breath sounds normal. He has no wheezes.  Abdominal: Soft. Bowel sounds are normal. There is no tenderness.  Musculoskeletal:       Lumbar back: He exhibits tenderness. He  exhibits no pain and no spasm.       Back:  Neurological: He is alert and oriented to person, place, and time.  Psychiatric: Mood, memory, affect and judgment normal.  Nursing note and vitals reviewed.      Assessment & Plan  1. Chronic right-sided low back pain with right-sided sciatica Stable and responsive to oxycodone for pain, cyclobenzaprine for muscle spasm, although it is worse recently because of the change in temperature. Advised that after my departure from the practice, he will need to be referred to pain clinic or to a different provider and he verbalized agreement - Oxycodone HCl 10 MG TABS;  Take 1 tablet (10 mg total) by mouth every 12 (twelve) hours as needed.  Dispense: 60 tablet; Refill: 0 - cyclobenzaprine (FLEXERIL) 10 MG tablet; Take 1 tablet (10 mg total) by mouth 2 (two) times daily as needed.  Dispense: 180 tablet; Refill: 0  2. Gastroesophageal reflux disease, esophagitis presence not specified Symptoms of acid reflux are stable on Protonix - pantoprazole (PROTONIX) 40 MG tablet; Take 1 tablet (40 mg total) by mouth every morning.  Dispense: 90 tablet; Refill: 1  3. History of benign esophageal tumor  - dicyclomine (BENTYL) 10 MG capsule; Take 1 capsule (10 mg total) by mouth 2 (two) times daily.  Dispense: 180 capsule; Refill: 0  4. Other polyneuropathy  - gabapentin (NEURONTIN) 300 MG capsule; Take 1 capsule (300 mg total) by mouth 2 (two) times daily.  Dispense: 180 capsule; Refill: 0   Symone Cornman Asad A. Faylene KurtzShah [facility] 03/28/2017 9:20 AM

## 2017-03-28 NOTE — Progress Notes (Signed)
The following letter was provided to Perlie Gold during their visit today: ---------------------------------------  Dear valued [facility] Patient,  I am writing to share that as of July 18, 2017, I will no longer be seeing patients at [facility]. While it has been my privilege to care for you as a physician, I have decided to move outside of [place] to pursue other opportunities.  The staff at [facility] has been supportive of my decision and are supportive of any patients who have been under my care.  They will be happy to provide care to you and your family.  The office staff will do everything they can to ensure a seamless transition of care at [facility].  However if you are on any controlled substance medications (i.e. Pain medication or benzodiazepines), they will no longer be able to refill those medications, but we will be more than happy to refer you to a specialist.  [facility] will also assist you with the transfer of medical records should you wish to seek care elsewhere.  If you have any questions about your future care, you may call the office at [phone].  I have enjoyed getting to know my patients here and I wish you the very best.  Sincerely,  Velta Addison, MD  ---------------------------------------  A written copy of this letter was given to the patient.  The patient verbalizes understanding of the letter, and does request referral to specialist or to new primary care provider.  This decision has been conveyed to Dr. Sherryll Burger, who will place any appropriate referrals during today's visit.

## 2017-05-08 ENCOUNTER — Telehealth: Payer: Self-pay | Admitting: Family Medicine

## 2017-05-08 ENCOUNTER — Ambulatory Visit: Payer: 59

## 2017-05-08 DIAGNOSIS — M5441 Lumbago with sciatica, right side: Principal | ICD-10-CM

## 2017-05-08 DIAGNOSIS — G8929 Other chronic pain: Secondary | ICD-10-CM

## 2017-05-08 MED ORDER — OXYCODONE HCL 10 MG PO TABS: 10.0000 mg | ORAL_TABLET | Freq: Two times a day (BID) | ORAL | 0 refills | Status: DC | PRN

## 2017-05-08 NOTE — Telephone Encounter (Signed)
LFT MESS TO NOTIFIED PT RX READY FOR PICK UP

## 2017-05-08 NOTE — Telephone Encounter (Signed)
Copied from CRM [id number]. Topic: Inquiry >> May 08, 2017 12:17 PM Stephannie Li, NT wrote: Reason for CRM: Patient needs prescription for prednisone called in at [facility] in [place], [phone] fax [phone] or please call patient at  [phone] with  any questions

## 2017-05-08 NOTE — Telephone Encounter (Signed)
Pt is needing his refill on his pain medication

## 2017-05-08 NOTE — Telephone Encounter (Signed)
Copied from CRM [id number]. Topic: Quick Communication - See Telephone Encounter >> May 08, 2017  9:18 AM Guinevere Ferrari, NT wrote: CRM for notification. See Telephone encounter for: Pt is calling in to get a refill prescription on  Oxycodone HCl 10 MG TABS. Pt would like a call when the medication is ready. 05/08/17.

## 2017-05-08 NOTE — Telephone Encounter (Signed)
Rx has been picked up

## 2017-05-08 NOTE — Telephone Encounter (Signed)
Scription for oxycodone 10 mg is ready for pickup, please inform patient that he will need to be referred to a pain clinic for his next refill because of my departure from the practice.

## 2017-05-09 ENCOUNTER — Ambulatory Visit: Payer: Medicare Other

## 2017-05-09 ENCOUNTER — Other Ambulatory Visit: Payer: Self-pay | Admitting: Family Medicine

## 2017-05-09 DIAGNOSIS — G6289 Other specified polyneuropathies: Secondary | ICD-10-CM

## 2017-05-09 NOTE — Telephone Encounter (Signed)
Dr. Sherryll Burger,  Pt's wife Okey Dupre called and stated she spoke with you yesterday about refilling this patient's prednisone for his sciatica pain. She states she request this be sent to [facility].

## 2017-05-11 NOTE — Telephone Encounter (Signed)
As far as I could review, patient has not been prescribed prednisone for sciatica, he has been on gabapentin and oxycodone.  He will likely need an appointment for evaluation of his symptoms first.

## 2017-05-13 NOTE — Telephone Encounter (Signed)
Contacted patient and wife was upset. Stated that this has been going on for days and nothing has been done. I informed patient wife that Dr Sherryll Burger would like for patient to come in, she stated that he is not able to come in until the beginning of the year. she refused appointment and hung up

## 2017-05-20 ENCOUNTER — Ambulatory Visit: Payer: Medicare Other

## 2017-05-20 ENCOUNTER — Ambulatory Visit: Payer: Medicare Other | Admitting: Family Medicine

## 2017-05-30 DIAGNOSIS — G5602 Carpal tunnel syndrome, left upper limb: Secondary | ICD-10-CM | POA: Diagnosis not present

## 2017-06-02 DIAGNOSIS — R0782 Intercostal pain: Secondary | ICD-10-CM | POA: Diagnosis not present

## 2017-06-02 DIAGNOSIS — I10 Essential (primary) hypertension: Secondary | ICD-10-CM | POA: Diagnosis not present

## 2017-06-02 DIAGNOSIS — I2510 Atherosclerotic heart disease of native coronary artery without angina pectoris: Secondary | ICD-10-CM | POA: Diagnosis not present

## 2017-06-02 DIAGNOSIS — E7800 Pure hypercholesterolemia, unspecified: Secondary | ICD-10-CM | POA: Diagnosis not present

## 2017-06-17 DIAGNOSIS — M79675 Pain in left toe(s): Secondary | ICD-10-CM | POA: Diagnosis not present

## 2017-06-17 DIAGNOSIS — L600 Ingrowing nail: Secondary | ICD-10-CM | POA: Diagnosis not present

## 2017-06-17 DIAGNOSIS — M79674 Pain in right toe(s): Secondary | ICD-10-CM | POA: Diagnosis not present

## 2017-06-25 ENCOUNTER — Encounter (INDEPENDENT_AMBULATORY_CARE_PROVIDER_SITE_OTHER): Payer: Self-pay | Admitting: Vascular Surgery

## 2017-06-25 ENCOUNTER — Other Ambulatory Visit: Payer: Self-pay | Admitting: Family Medicine

## 2017-06-25 DIAGNOSIS — G8929 Other chronic pain: Secondary | ICD-10-CM

## 2017-06-25 DIAGNOSIS — M5441 Lumbago with sciatica, right side: Principal | ICD-10-CM

## 2017-06-25 NOTE — Telephone Encounter (Signed)
Copied from CRM [id number]. Topic: General - Other >> Jun 25, 2017  9:20 AM Cecelia Byars, RMA wrote: Reason for CRM: Medication refill request for Oxycodone HCI 10 mg, please call pt when prescription is ready for pick up

## 2017-06-25 NOTE — Telephone Encounter (Signed)
Pt was not referred to pain clinic. Last apt was 03/28/2017. Last Rx refill was 05/08/2017

## 2017-06-27 ENCOUNTER — Encounter (INDEPENDENT_AMBULATORY_CARE_PROVIDER_SITE_OTHER): Payer: Medicare Other

## 2017-06-27 ENCOUNTER — Ambulatory Visit (INDEPENDENT_AMBULATORY_CARE_PROVIDER_SITE_OTHER): Payer: Medicare Other | Admitting: Vascular Surgery

## 2017-07-02 ENCOUNTER — Encounter: Payer: Self-pay | Admitting: Family Medicine

## 2017-07-02 ENCOUNTER — Ambulatory Visit: Payer: Medicare Other | Admitting: Family Medicine

## 2017-07-02 VITALS — BP 110/64 | HR 61 | Temp 97.9°F | Resp 16 | Ht 68.0 in | Wt 207.7 lb

## 2017-07-02 DIAGNOSIS — M5441 Lumbago with sciatica, right side: Secondary | ICD-10-CM

## 2017-07-02 DIAGNOSIS — G8929 Other chronic pain: Secondary | ICD-10-CM | POA: Diagnosis not present

## 2017-07-02 DIAGNOSIS — M5442 Lumbago with sciatica, left side: Secondary | ICD-10-CM | POA: Diagnosis not present

## 2017-07-02 DIAGNOSIS — Z86018 Personal history of other benign neoplasm: Secondary | ICD-10-CM

## 2017-07-02 DIAGNOSIS — G6189 Other inflammatory polyneuropathies: Secondary | ICD-10-CM | POA: Diagnosis not present

## 2017-07-02 MED ORDER — DICYCLOMINE HCL 10 MG PO CAPS: 10.0000 mg | ORAL_CAPSULE | Freq: Two times a day (BID) | ORAL | 0 refills | Status: AC

## 2017-07-02 MED ORDER — CYCLOBENZAPRINE HCL 10 MG PO TABS: 10.0000 mg | ORAL_TABLET | Freq: Two times a day (BID) | ORAL | 0 refills | Status: DC | PRN

## 2017-07-02 MED ORDER — GABAPENTIN 300 MG PO CAPS: 300.0000 mg | ORAL_CAPSULE | Freq: Three times a day (TID) | ORAL | 0 refills | Status: DC

## 2017-07-02 MED ORDER — PREDNISONE 5 MG (21) PO TBPK: ORAL_TABLET | ORAL | 0 refills | Status: DC

## 2017-07-02 MED ORDER — OXYCODONE HCL 10 MG PO TABS: 10.0000 mg | ORAL_TABLET | Freq: Two times a day (BID) | ORAL | 0 refills | Status: DC | PRN

## 2017-07-02 NOTE — Progress Notes (Signed)
Name: [name] [name]   MRN: [id number]    DOB: January 24, 1946   Date:07/02/2017       Progress Note  Subjective  Chief Complaint  Chief Complaint  Patient presents with  . Follow-up    3 months  . Medication Refill    HPI  Pt. presents for medication management and follow up on chronic right sided radicular low back pain. He has degenerative disc disease and had three surgeries on his lower back, now in chronic pain with pain rated at 3/10, also experiencing some left sided pain radiating down the left posterior leg. Pain gets worse with cold weather and with being on his feet. He also has neuropathy with numbness and tingling in both feet. Left sided back pain started 2 weeks ago, no inciting factors, pain radiating down the left leg, no weakness or numbness in the leg, he has previously taken Prednisone for left sided low back pain.    Past Medical History:  Diagnosis Date  . Anginal pain (HCC)   . Arthritis   . Back pain    degenerative disc disease  . Carpal tunnel syndrome of right wrist   . CHF (congestive heart failure) (HCC)   . Coronary artery disease   . Diverticulosis   . GERD (gastroesophageal reflux disease)   . Myocardial infarction (HCC)   . Neuropathy   . Wears dentures    full upper and lower  . Wears hearing aid    bilateral    Past Surgical History:  Procedure Laterality Date  . ANKLE RECONSTRUCTION Left 1991  . APPENDECTOMY    . BACK SURGERY N/A    lower back x 3  . CARDIAC CATHETERIZATION    . CARPAL TUNNEL RELEASE Left 03/2014   In Dr. Rosita Kea office  . CARPAL TUNNEL RELEASE Left 10/20/2014   Procedure: CARPAL TUNNEL RELEASE;  Surgeon: Kennedy Bucker, MD;  Location: [facility];  Service: Orthopedics;  Laterality: Left;  . CORONARY ARTERY BYPASS GRAFT  March 2014   [facility]  . ESOPHAGOGASTRODUODENOSCOPY (EGD) WITH PROPOFOL N/A 10/26/2015   Procedure: ESOPHAGOGASTRODUODENOSCOPY (EGD) WITH PROPOFOL with dialation;  Surgeon: [name], MD;   Location: [facility];  Service: Endoscopy;  Laterality: N/A;  . EYE SURGERY Bilateral    Cataract Extraction with IOL  . NECK SURGERY N/A    neck x 2, one discectomy and one shaved disc.  Marland Kitchen SHOULDER ARTHROSCOPY Right 06/06/2015   Procedure: right shoulder arthroscopy, arthroscopic debridement, subacromial decompression, SLAP repair, biotenodesis, mini open rotator cuff repair;  Surgeon: Christena Flake, MD;  Location: [facility];  Service: Orthopedics;  Laterality: Right;  . SHOULDER OPEN ROTATOR CUFF REPAIR     Left    Family History  Problem Relation Age of Onset  . Diverticulitis Sister     Social History   Socioeconomic History  . Marital status: Married    Spouse name: Not on file  . Number of children: Not on file  . Years of education: Not on file  . Highest education level: Not on file  Social Needs  . Financial resource strain: Not on file  . Food insecurity - worry: Not on file  . Food insecurity - inability: Not on file  . Transportation needs - medical: Not on file  . Transportation needs - non-medical: Not on file  Occupational History  . Not on file  Tobacco Use  . Smoking status: Former Smoker    Last attempt to quit: 06/18/2013  Years since quitting: 4.0  . Smokeless tobacco: Never Used  Substance and Sexual Activity  . Alcohol use: Yes    Alcohol/week: 0.0 - 0.6 oz    Comment: occasionally  . Drug use: No  . Sexual activity: Yes  Other Topics Concern  . Not on file  Social History Narrative  . Not on file     Current Outpatient Medications:  .  aspirin 81 MG tablet, Take 81 mg by mouth every morning., Disp: , Rfl:  .  cyclobenzaprine (FLEXERIL) 10 MG tablet, Take 1 tablet (10 mg total) by mouth 2 (two) times daily as needed., Disp: 180 tablet, Rfl: 0 .  dicyclomine (BENTYL) 10 MG capsule, Take 1 capsule (10 mg total) by mouth 2 (two) times daily., Disp: 180 capsule, Rfl: 0 .  docusate sodium (COLACE) 100 MG capsule, Take 100 mg by mouth 2  (two) times daily., Disp: , Rfl:  .  gabapentin (NEURONTIN) 300 MG capsule, Take 1 capsule (300 mg total) by mouth 2 (two) times daily., Disp: 180 capsule, Rfl: 0 .  isosorbide mononitrate (IMDUR) 30 MG 24 hr tablet, , Disp: , Rfl:  .  nitroGLYCERIN (NITROSTAT) 0.4 MG SL tablet, Place 1 tablet (0.4 mg total) under the tongue every 5 (five) minutes as needed for chest pain. Max 3 doses within 15 mins, Disp: 30 tablet, Rfl: 0 .  Oxycodone HCl 10 MG TABS, Take 1 tablet (10 mg total) by mouth every 12 (twelve) hours as needed., Disp: 60 tablet, Rfl: 0 .  pantoprazole (PROTONIX) 40 MG tablet, Take 1 tablet (40 mg total) by mouth every morning., Disp: 90 tablet, Rfl: 1 .  gabapentin (NEURONTIN) 300 MG capsule, Take 1 capsule (300 mg total) by mouth 2 (two) times daily. (Patient not taking: Reported on 07/02/2017), Disp: 180 capsule, Rfl: 0  Allergies  Allergen Reactions  . Parafon Forte Dsc [Chlorzoxazone] Rash and Itching    severe  . Statins Other (See Comments)  . Cucumber Extract Other (See Comments)    GI upset     ROS  Please see HPI for complete discussion of ROS  Objective  Vitals:   07/02/17 1006  BP: 110/64  Pulse: 61  Resp: 16  Temp: 97.9 F (36.6 C)  TempSrc: Oral  SpO2: 99%  Weight: 207 lb 11.2 oz (94.2 kg)  Height: 5\' 8"  (1.727 m)    Physical Exam  Constitutional: He is oriented to person, place, and time and well-developed, well-nourished, and in no distress.  HENT:  Head: Normocephalic and atraumatic.  Cardiovascular: Normal rate, regular rhythm and normal heart sounds.  No murmur heard. Pulmonary/Chest: Effort normal and breath sounds normal. He has no wheezes.  Musculoskeletal: He exhibits no edema.       Lumbar back: He exhibits tenderness, pain and spasm.       Back:  SLR positive on the right side.   Neurological: He is alert and oriented to person, place, and time.  Psychiatric: Mood, memory, affect and judgment normal.  Nursing note and vitals  reviewed.    Assessment & Plan  1. Chronic right-sided low back pain with right-sided sciatica Tonic, likely because of degenerative disc disease, on oxycodone taken every 12 hours as needed, patient is going to establish care with his wife's PCP, refills provided - cyclobenzaprine (FLEXERIL) 10 MG tablet; Take 1 tablet (10 mg total) by mouth 2 (two) times daily as needed.  Dispense: 180 tablet; Refill: 0 - Oxycodone HCl 10 MG TABS; Take 1 tablet (10  mg total) by mouth every 12 (twelve) hours as needed.  Dispense: 60 tablet; Refill: 0  2. History of benign esophageal tumor  - dicyclomine (BENTYL) 10 MG capsule; Take 1 capsule (10 mg total) by mouth 2 (two) times daily.  Dispense: 180 capsule; Refill: 0  3. Other inflammatory polyneuropathies (HCC) Crease gabapentin to 3 times daily for treatment of inflammatory polyneuropathy - gabapentin (NEURONTIN) 300 MG capsule; Take 1 capsule (300 mg total) by mouth 3 (three) times daily.  Dispense: 270 capsule; Refill: 0  4. Acute left-sided low back pain with left-sided sciatica Acute left-sided back pain, start on prednisone tapering dose for treatment of inflammation, if pain is recurrent, may need referral to orthopedics. - predniSONE (STERAPRED UNI-PAK 21 TAB) 5 MG (21) TBPK tablet; 60 50 40 30 20 10  then STOP  Dispense: 21 tablet; Refill: 0   Hedda Crumbley Asad A. [facility] 07/02/2017 10:22 AM

## 2017-09-15 ENCOUNTER — Telehealth: Payer: Self-pay

## 2017-09-15 DIAGNOSIS — G6189 Other inflammatory polyneuropathies: Secondary | ICD-10-CM

## 2017-09-15 MED ORDER — GABAPENTIN 300 MG PO CAPS: 300.0000 mg | ORAL_CAPSULE | Freq: Three times a day (TID) | ORAL | 0 refills | Status: DC

## 2017-09-15 NOTE — Telephone Encounter (Signed)
Refill request for general medication: Gabapentin 300 mg  Last office visit: 07/02/2017  Last physical exam: None indicated  Follow-ups on file. None indicated

## 2017-09-15 NOTE — Telephone Encounter (Signed)
[place]CC[place] web site reviewed Controlled substances prescribed from another office since Dr. Sherryll Burger left No upcoming appointments with Dr. Carlynn Purl I approved just one week of medicine, as he'll need to establish with her or get this medicine from his new provider

## 2017-09-16 NOTE — Telephone Encounter (Signed)
Spoke with patient and he thanks you however he stated that he is no longer a patient here. I apologized for calling and informed him that I will correct it in the system. I have taken our name off his pcp list.

## 2017-10-26 IMAGING — CT CT SHOULDER*R* W/CM
4 series · 11 of 20 positions shown, 12 images · non-contrast
Comparison: Radiographs dated 05/23/2015

CLINICAL DATA: Right shoulder pain since a fall 1 week ago.

EXAM:
CT ARTHROGRAPHY OF THE RIGHT SHOULDER
TECHNIQUE: Multidetector CT imaging was performed following the standard
protocol after injection of dilute contrast into the joint.

[Series 2: shoulder 3mm · axial · 0.46mm/px · z∈[-192,-94]mm · 4 of 109 slices shown, 5 images]
[im 22/109  soft-tissue]
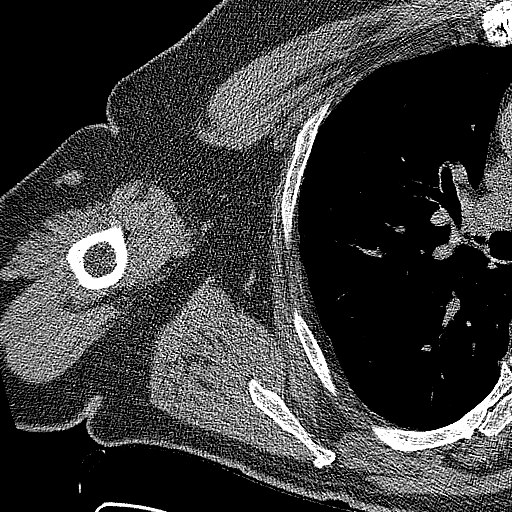
[im 22/109  bone]
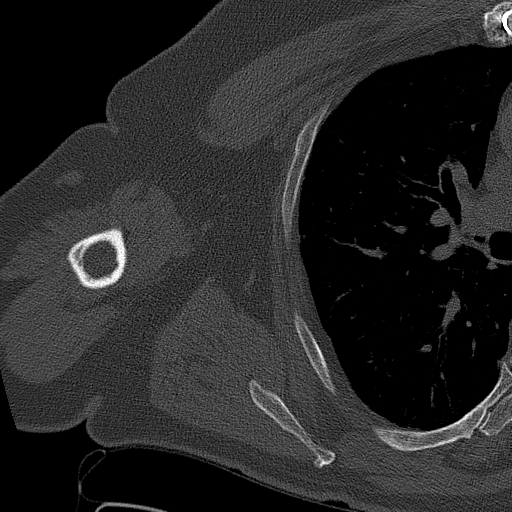
[im 44/109  bone]
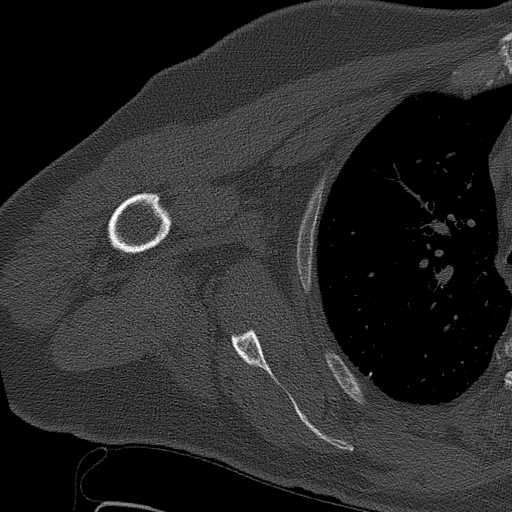
[im 65/109  bone]
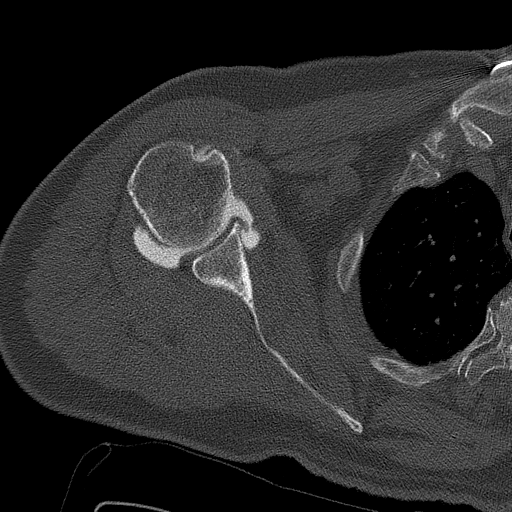
[im 87/109  bone]
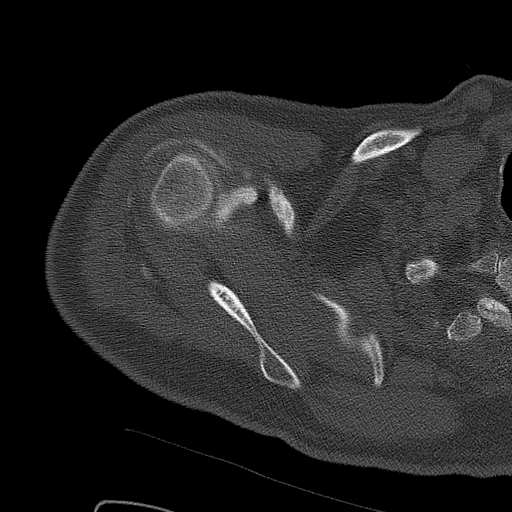

[Series 602: bone axial · axial · 0.46mm/px · z∈[-194,-148]mm · 2 of 81 slices shown]
[im 27/81  bone]
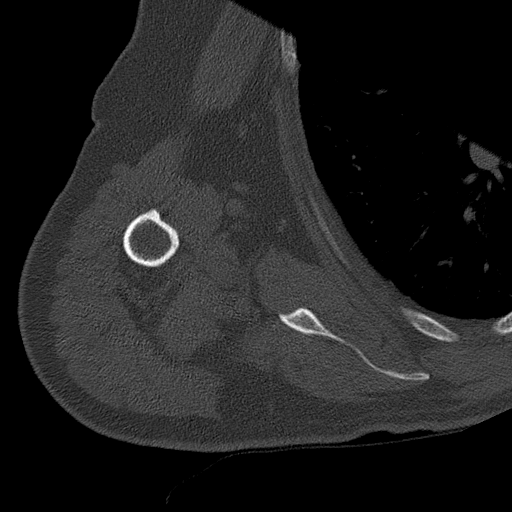
[im 54/81  bone]
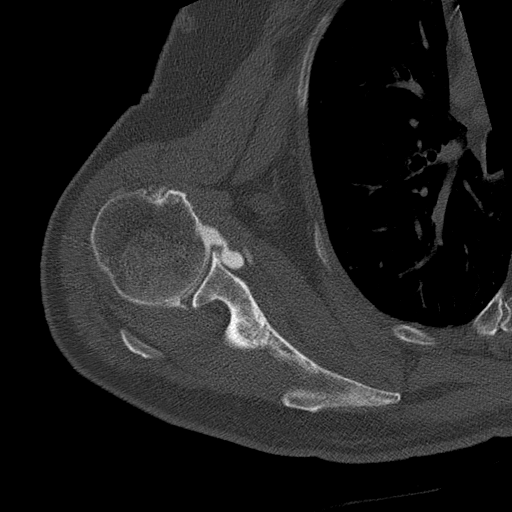

[Series 605: st axial · axial · 0.46mm/px · z∈[-197,-153]mm · 2 of 83 slices shown]
[im 28/83  bone]
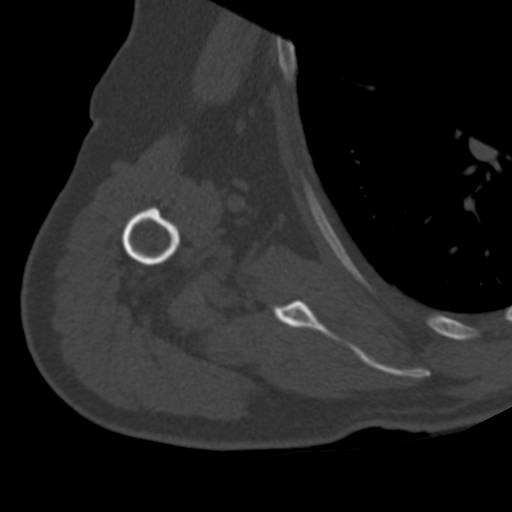
[im 55/83  bone]
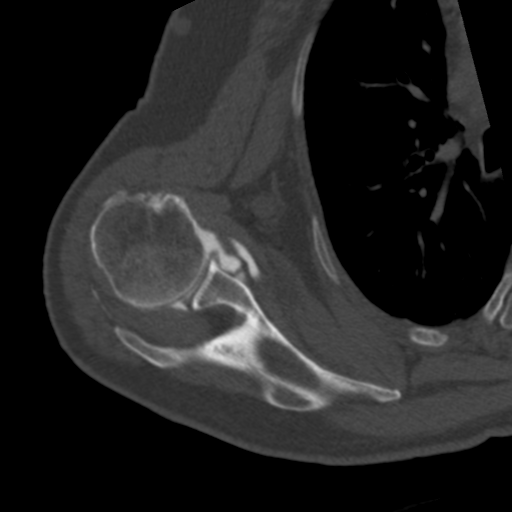

[Series 606: st sag · coronal · 0.46mm/px · 3 of 96 slices shown]
[im 20/96  bone]
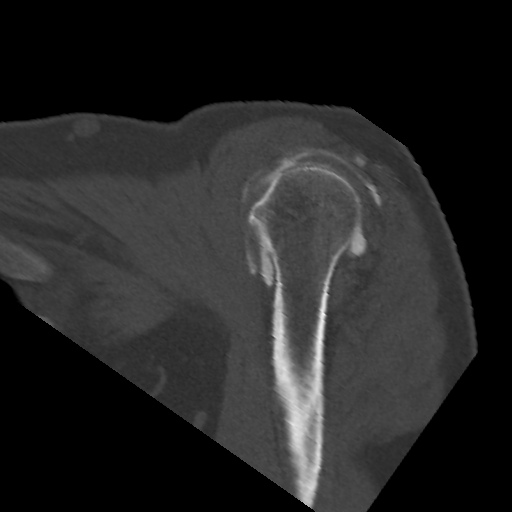
[im 39/96  bone]
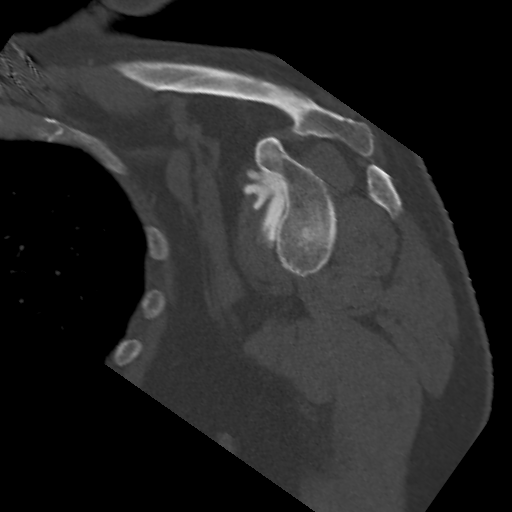
[im 58/96  bone]
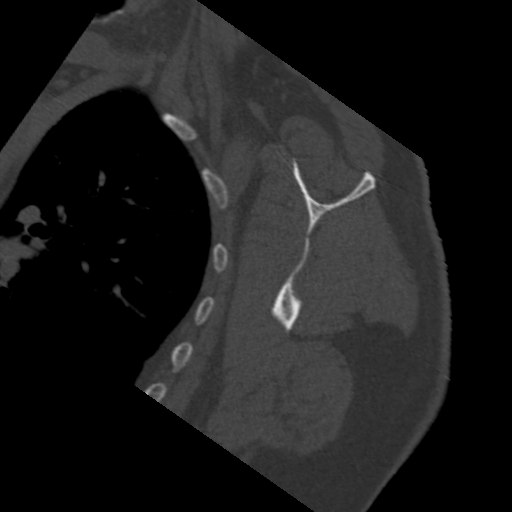

[11 of 20 positions shown; findings below may reference images not displayed]

FINDINGS: Rotator cuff: There is a 25 x 10 mm full-thickness retracted tear of
the anterior aspect of the distal supraspinatus tendon. There is an
intrasubstance tear of the distal subscapularis tendon into which
the long head of the biceps tendon has subluxed.

Muscles:  No atrophy or edema of the muscles of the rotator cuff.

Long head of the biceps tendon: The tendon is subluxed into an
intrasubstance tear of the distal subscapularis tendon but the
biceps tendon is intact.

AC joint: Moderate arthritis of the AC joint.  Type 2 acromion.

Glenohumeral joint:  Normal.

Labrum:  Normal.

Bones: Cystic degenerative changes of the lesser tuberosity and of
the anterior aspect of the greater tuberosity.
IMPRESSION: 1. Large full-thickness retracted tear of the distal supraspinatus
tendon.
2. Subluxation of the long head of the biceps tendon into an
intrasubstance tear of the distal subscapularis tendon.

## 2017-11-09 ENCOUNTER — Inpatient Hospital Stay (HOSPITAL_COMMUNITY)
Admission: AD | Admit: 2017-11-09 | Discharge: 2017-11-11 | DRG: 065 | Disposition: A | Discharge status: Home / Self Care | Payer: Medicare Other | Source: Other Acute Inpatient Hospital | Attending: Neurology | Admitting: Neurology

## 2017-11-09 ENCOUNTER — Emergency Department
Admission: EM | Admit: 2017-11-09 | Discharge: 2017-11-09 | Disposition: A | Discharge status: Other Acute Inpatient Hospital | Payer: Medicare Other | Attending: Emergency Medicine | Admitting: Emergency Medicine

## 2017-11-09 ENCOUNTER — Encounter: Payer: Self-pay | Admitting: Emergency Medicine

## 2017-11-09 ENCOUNTER — Emergency Department: Payer: Medicare Other

## 2017-11-09 ENCOUNTER — Other Ambulatory Visit: Payer: Self-pay

## 2017-11-09 DIAGNOSIS — R531 Weakness: Secondary | ICD-10-CM | POA: Diagnosis present

## 2017-11-09 DIAGNOSIS — Z91018 Allergy to other foods: Secondary | ICD-10-CM | POA: Diagnosis not present

## 2017-11-09 DIAGNOSIS — Z7982 Long term (current) use of aspirin: Secondary | ICD-10-CM | POA: Insufficient documentation

## 2017-11-09 DIAGNOSIS — Z87891 Personal history of nicotine dependence: Secondary | ICD-10-CM | POA: Diagnosis not present

## 2017-11-09 DIAGNOSIS — I252 Old myocardial infarction: Secondary | ICD-10-CM | POA: Diagnosis not present

## 2017-11-09 DIAGNOSIS — Z79899 Other long term (current) drug therapy: Secondary | ICD-10-CM | POA: Insufficient documentation

## 2017-11-09 DIAGNOSIS — Z9842 Cataract extraction status, left eye: Secondary | ICD-10-CM | POA: Diagnosis not present

## 2017-11-09 DIAGNOSIS — Z951 Presence of aortocoronary bypass graft: Secondary | ICD-10-CM | POA: Diagnosis not present

## 2017-11-09 DIAGNOSIS — Z9282 Status post administration of tPA (rtPA) in a different facility within the last 24 hours prior to admission to current facility: Secondary | ICD-10-CM | POA: Diagnosis not present

## 2017-11-09 DIAGNOSIS — Z888 Allergy status to other drugs, medicaments and biological substances status: Secondary | ICD-10-CM | POA: Diagnosis not present

## 2017-11-09 DIAGNOSIS — Z974 Presence of external hearing-aid: Secondary | ICD-10-CM | POA: Diagnosis not present

## 2017-11-09 DIAGNOSIS — Z961 Presence of intraocular lens: Secondary | ICD-10-CM | POA: Diagnosis present

## 2017-11-09 DIAGNOSIS — Z9841 Cataract extraction status, right eye: Secondary | ICD-10-CM

## 2017-11-09 DIAGNOSIS — I5022 Chronic systolic (congestive) heart failure: Secondary | ICD-10-CM | POA: Diagnosis present

## 2017-11-09 DIAGNOSIS — G6189 Other inflammatory polyneuropathies: Secondary | ICD-10-CM

## 2017-11-09 DIAGNOSIS — K219 Gastro-esophageal reflux disease without esophagitis: Secondary | ICD-10-CM | POA: Diagnosis present

## 2017-11-09 DIAGNOSIS — M549 Dorsalgia, unspecified: Secondary | ICD-10-CM | POA: Diagnosis present

## 2017-11-09 DIAGNOSIS — Z972 Presence of dental prosthetic device (complete) (partial): Secondary | ICD-10-CM

## 2017-11-09 DIAGNOSIS — G8194 Hemiplegia, unspecified affecting left nondominant side: Secondary | ICD-10-CM | POA: Diagnosis present

## 2017-11-09 DIAGNOSIS — G629 Polyneuropathy, unspecified: Secondary | ICD-10-CM

## 2017-11-09 DIAGNOSIS — E785 Hyperlipidemia, unspecified: Secondary | ICD-10-CM | POA: Diagnosis present

## 2017-11-09 DIAGNOSIS — I6300 Cerebral infarction due to thrombosis of unspecified precerebral artery: Secondary | ICD-10-CM | POA: Diagnosis not present

## 2017-11-09 DIAGNOSIS — E669 Obesity, unspecified: Secondary | ICD-10-CM | POA: Diagnosis present

## 2017-11-09 DIAGNOSIS — E7800 Pure hypercholesterolemia, unspecified: Secondary | ICD-10-CM | POA: Diagnosis present

## 2017-11-09 DIAGNOSIS — I2510 Atherosclerotic heart disease of native coronary artery without angina pectoris: Secondary | ICD-10-CM | POA: Diagnosis present

## 2017-11-09 DIAGNOSIS — I110 Hypertensive heart disease with heart failure: Secondary | ICD-10-CM | POA: Diagnosis present

## 2017-11-09 DIAGNOSIS — I6381 Other cerebral infarction due to occlusion or stenosis of small artery: Principal | ICD-10-CM | POA: Diagnosis present

## 2017-11-09 DIAGNOSIS — G8929 Other chronic pain: Secondary | ICD-10-CM | POA: Diagnosis present

## 2017-11-09 DIAGNOSIS — I6789 Other cerebrovascular disease: Secondary | ICD-10-CM | POA: Diagnosis not present

## 2017-11-09 DIAGNOSIS — M5441 Lumbago with sciatica, right side: Secondary | ICD-10-CM

## 2017-11-09 DIAGNOSIS — I639 Cerebral infarction, unspecified: Secondary | ICD-10-CM | POA: Diagnosis present

## 2017-11-09 DIAGNOSIS — I509 Heart failure, unspecified: Secondary | ICD-10-CM | POA: Diagnosis not present

## 2017-11-09 DIAGNOSIS — E66811 Obesity, class 1: Secondary | ICD-10-CM

## 2017-11-09 DIAGNOSIS — I10 Essential (primary) hypertension: Secondary | ICD-10-CM | POA: Diagnosis present

## 2017-11-09 DIAGNOSIS — Z6831 Body mass index (BMI) 31.0-31.9, adult: Secondary | ICD-10-CM | POA: Diagnosis not present

## 2017-11-09 DIAGNOSIS — R2990 Unspecified symptoms and signs involving the nervous system: Secondary | ICD-10-CM | POA: Diagnosis present

## 2017-11-09 LAB — DIFFERENTIAL
BASOS ABS: 0.1 10*3/uL (ref 0–0.1)
Basophils Relative: 1 %
Eosinophils Absolute: 0.2 10*3/uL (ref 0–0.7)
Eosinophils Relative: 3 %
LYMPHS ABS: 3.5 10*3/uL (ref 1.0–3.6)
Lymphocytes Relative: 41 %
Monocytes Absolute: 0.9 10*3/uL (ref 0.2–1.0)
Monocytes Relative: 10 %
NEUTROS ABS: 3.9 10*3/uL (ref 1.4–6.5)
NEUTROS PCT: 45 %

## 2017-11-09 LAB — MRSA PCR SCREENING: MRSA by PCR: NEGATIVE

## 2017-11-09 LAB — COMPREHENSIVE METABOLIC PANEL
ALBUMIN: 4.4 g/dL (ref 3.5–5.0)
ALT: 52 U/L (ref 17–63)
AST: 46 U/L — AB (ref 15–41)
Alkaline Phosphatase: 76 U/L (ref 38–126)
Anion gap: 7 (ref 5–15)
BILIRUBIN TOTAL: 1.3 mg/dL — AB (ref 0.3–1.2)
BUN: 21 mg/dL — AB (ref 6–20)
CHLORIDE: 103 mmol/L (ref 101–111)
CO2: 27 mmol/L (ref 22–32)
CREATININE: 0.71 mg/dL (ref 0.61–1.24)
Calcium: 9.3 mg/dL (ref 8.9–10.3)
GFR calc Af Amer: >60 mL/min (ref >60)
GLUCOSE: 126 mg/dL — AB (ref 65–99)
Potassium: 4 mmol/L (ref 3.5–5.1)
Sodium: 137 mmol/L (ref 135–145)
Total Protein: 7.1 g/dL (ref 6.5–8.1)

## 2017-11-09 LAB — CBC
HEMATOCRIT: 42.7 % (ref 40.0–52.0)
HEMOGLOBIN: 15 g/dL (ref 13.0–18.0)
MCH: 32.8 pg (ref 26.0–34.0)
MCHC: 35.2 g/dL (ref 32.0–36.0)
MCV: 93 fL (ref 80.0–100.0)
Platelets: 231 10*3/uL (ref 150–440)
RBC: 4.59 MIL/uL (ref 4.40–5.90)
RDW: 13.4 % (ref 11.5–14.5)
WBC: 8.7 10*3/uL (ref 3.8–10.6)

## 2017-11-09 LAB — GLUCOSE, CAPILLARY: Glucose-Capillary: 113 mg/dL — ABNORMAL HIGH (ref 65–99)

## 2017-11-09 LAB — APTT: APTT: 28 s (ref 24–36)

## 2017-11-09 LAB — TROPONIN I

## 2017-11-09 LAB — PROTIME-INR
INR: 0.96
Prothrombin Time: 12.7 seconds (ref 11.4–15.2)

## 2017-11-09 LAB — ETHANOL: Alcohol, Ethyl (B): <10 mg/dL (ref <10)

## 2017-11-09 MED ORDER — STROKE: EARLY STAGES OF RECOVERY BOOK
Freq: Once | Status: AC
  Administered 2017-11-09: 22:00:00
  Filled 2017-11-09: qty 1

## 2017-11-09 MED ORDER — IOPAMIDOL (ISOVUE-370) INJECTION 76%
75.0000 mL | Freq: Once | INTRAVENOUS | Status: AC | PRN
  Administered 2017-11-09: 100 mL via INTRAVENOUS

## 2017-11-09 MED ORDER — PANTOPRAZOLE SODIUM 20 MG PO TBEC
20.0000 mg | DELAYED_RELEASE_TABLET | Freq: Every day | ORAL | Status: DC
  Administered 2017-11-09 – 2017-11-11 (×3): 20 mg via ORAL
  Filled 2017-11-09 (×3): qty 1

## 2017-11-09 MED ORDER — ALTEPLASE 100 MG IV SOLR
INTRAVENOUS | Status: AC
  Administered 2017-11-09: 87.3 mg via INTRAVENOUS
  Filled 2017-11-09: qty 100

## 2017-11-09 MED ORDER — LABETALOL HCL 5 MG/ML IV SOLN
INTRAVENOUS | Status: AC
  Filled 2017-11-09: qty 4

## 2017-11-09 MED ORDER — ACETAMINOPHEN 160 MG/5ML PO SOLN: 650.0000 mg | ORAL | Status: DC | PRN

## 2017-11-09 MED ORDER — SODIUM CHLORIDE 0.9 % IV SOLN
50.0000 mL | Freq: Once | INTRAVENOUS | Status: AC
  Administered 2017-11-09: 20 mL via INTRAVENOUS

## 2017-11-09 MED ORDER — ACETAMINOPHEN 325 MG PO TABS
650.0000 mg | ORAL_TABLET | ORAL | Status: DC | PRN
  Administered 2017-11-10 (×2): 650 mg via ORAL
  Filled 2017-11-09 (×2): qty 2

## 2017-11-09 MED ORDER — LABETALOL HCL 5 MG/ML IV SOLN: 10.0000 mg | Freq: Once | INTRAVENOUS | Status: DC | PRN

## 2017-11-09 MED ORDER — ALTEPLASE (STROKE) FULL DOSE INFUSION
0.9000 mg/kg | Freq: Once | INTRAVENOUS | Status: AC
  Administered 2017-11-09: 87.3 mg via INTRAVENOUS

## 2017-11-09 MED ORDER — OXYCODONE-ACETAMINOPHEN 5-325 MG PO TABS
1.0000 | ORAL_TABLET | Freq: Every evening | ORAL | Status: DC | PRN
  Administered 2017-11-09 – 2017-11-10 (×2): 1 via ORAL
  Filled 2017-11-09 (×2): qty 1

## 2017-11-09 MED ORDER — SODIUM CHLORIDE 0.9 % IV SOLN
50.0000 mL/h | INTRAVENOUS | Status: DC
  Administered 2017-11-09: 50 mL/h via INTRAVENOUS

## 2017-11-09 MED ORDER — ISOSORBIDE MONONITRATE ER 30 MG PO TB24
30.0000 mg | ORAL_TABLET | Freq: Every day | ORAL | Status: DC
  Administered 2017-11-10 – 2017-11-11 (×2): 30 mg via ORAL
  Filled 2017-11-09 (×2): qty 1

## 2017-11-09 MED ORDER — NICARDIPINE HCL IN NACL 20-0.86 MG/200ML-% IV SOLN
0.0000 mg/h | INTRAVENOUS | Status: DC | PRN
  Filled 2017-11-09: qty 200

## 2017-11-09 MED ORDER — ACETAMINOPHEN 650 MG RE SUPP: 650.0000 mg | RECTAL | Status: DC | PRN

## 2017-11-09 NOTE — ED Notes (Signed)
Family at bedside. 

## 2017-11-09 NOTE — ED Notes (Signed)
Pt taken to CT by [place] RN

## 2017-11-09 NOTE — Progress Notes (Signed)
CODE STROKE- PHARMACY COMMUNICATION   Time CODE STROKE called/page received: 1722  Time response to CODE STROKE was made (in person or via phone): phone  Time Stroke Kit retrieved from [employer] (only if needed): Patient going to CT currently   Name of Provider/Nurse contacted:Hunter  Past Medical History:  Diagnosis Date  . Anginal pain (HCC)   . Arthritis   . Back pain    degenerative disc disease  . Carpal tunnel syndrome of right wrist   . CHF (congestive heart failure) (HCC)   . Coronary artery disease   . Diverticulosis   . GERD (gastroesophageal reflux disease)   . Myocardial infarction (HCC)   . Neuropathy   . Wears dentures    full upper and lower  . Wears hearing aid    bilateral   Prior to Admission medications   Medication Sig Start Date End Date Taking? Authorizing Provider  aspirin 81 MG tablet Take 81 mg by mouth every morning.    [provider]  cyclobenzaprine (FLEXERIL) 10 MG tablet Take 1 tablet (10 mg total) by mouth 2 (two) times daily as needed. 07/02/17   Shah, Syed Asad A, MD  dicyclomine (BENTYL) 10 MG capsule Take 1 capsule (10 mg total) by mouth 2 (two) times daily. 07/02/17   Shah, Syed Asad A, MD  docusate sodium (COLACE) 100 MG capsule Take 100 mg by mouth 2 (two) times daily.    [provider]  gabapentin (NEURONTIN) 300 MG capsule Take 1 capsule (300 mg total) by mouth 3 (three) times daily. 09/15/17   Lada, Melinda P, MD  isosorbide mononitrate (IMDUR) 30 MG 24 hr tablet  05/04/16   [provider]  nitroGLYCERIN (NITROSTAT) 0.4 MG SL tablet Place 1 tablet (0.4 mg total) under the tongue every 5 (five) minutes as needed for chest pain. Max 3 doses within 15 mins 10/17/15   Shah, Syed Asad A, MD  Oxycodone HCl 10 MG TABS Take 1 tablet (10 mg total) by mouth every 12 (twelve) hours as needed. 07/02/17   Shah, Syed Asad A, MD  pantoprazole (PROTONIX) 40 MG tablet Take 1 tablet (40 mg total) by mouth every morning. 03/28/17    Shah, Syed Asad A, MD  predniSONE (STERAPRED UNI-PAK 21 TAB) 5 MG (21) TBPK tablet 60 50 40 30 20 10 then STOP 07/02/17   Shah, Syed Asad A, MD    Stephanie Shuder ,PharmD Pharmacy Resident  11/09/2017  5:22 PM   

## 2017-11-09 NOTE — ED Notes (Signed)
Rifenbark MD at bedside. Code stroke called.

## 2017-11-09 NOTE — ED Notes (Signed)
EMTALA reviewed by this RN Psychologist, occupational).  Secretary informed that EMTALA is okay to print.

## 2017-11-09 NOTE — ED Notes (Signed)
Neuro on at bedside at this time

## 2017-11-09 NOTE — ED Notes (Signed)
Pt back from CT at this time 
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
## 2017-11-09 NOTE — Progress Notes (Signed)
Chaplain responded to Code Stroke and offered silent prayer for the patient and care team. Chaplain will return for pastoral care as needed.

## 2017-11-09 NOTE — ED Triage Notes (Signed)
PT to ED via EMS from home with c/o LFT side weakness x7min. Pt took nap x2 hrs ago and woke up with numbness. MD at bedside . LKW was 1400

## 2017-11-09 NOTE — Consult Note (Addendum)
[facility] TeleNeurology Consult Services  Asked to see this patient in telemedicine consultation. Consultation was performed with assistance of ancillary/medical staff at bedside.  Comments: Last Known normal  1400 Door Time: 1714 [facility] Contacted: 1736 [facility] first log in: 1744 NIHSS assessment time: 1805 Call back time: 1844  Needle Time: 1805  HPI:  46 yom who presents to hospital with left side weakness/numbness. Patient with hx of LBP and neck pain. He has neck pain that is worsened with lifting his Left arm but there is no drift. He has numbness and weakness in his left and states he had similar event three weeks ago but it resolved in 45 mins.  He states today it again feels like numb and weak but denies any back pain.  CT scan head was negative and iv tpa was discussed with him.  He denies GI/GU bleeding, denies recent surgery, denies a/c use, denies hx of ICH.  After reviewing risk / benefits / adverse effects of iv alteplase  He consented for treatment.   VSS  Gen Wn/Wd in Nad  TeleStroke Assessment: LOC:   0 LOC questions:  0 LOC Commands :   0 Gaze : 0 Visual fields :  0  Facial movements : 0 Upper limb Motor  0 Lower limb Motor  3 Limb Coordination  - 0 Sensory -  - 1 Language -  0 Speech -   0 Neglect / extinction -  0  NIHSS Score: 4    IMPRESSION  R ACA Stroke   Medical Decision Making:   Patient is  candidate for alteplase due to   focal / localizing exam and based on wt of 979 kg he was given iv alteplase at 1805.  Not an IR candidate as low clinical suspicion for LVO by neurologic assessment.   Recommendations:  Hold Daily antithrombotics for now if repeat ct scan head in 24 hrs without contraindication can start then. - Further work up with Stroke labs, MRI brain, ECHO, will be deferred to inpt  -  Needs Inpatient Neurology consultation and follow up  - Thank you for allowing [place] to participate in the care of your  patient, if there are any questions please don't hesitate to contact us  Discussed plan of care with patient/family/hospital staff   Physician: Terrace Arabia, DO   [facility]

## 2017-11-09 NOTE — ED Notes (Addendum)
PT in NAD at time of transfer, denies any pain. Pt verbalizes consent to transfer, family at bedside

## 2017-11-09 NOTE — ED Notes (Signed)
Verbal consent given by pt at this time to MD Rifenbark

## 2017-11-09 NOTE — ED Provider Notes (Signed)
[facility] Emergency Department Provider Note  ____________________________________________   First MD Initiated Contact with Patient 11/09/17 1717     (approximate)  I have reviewed the triage vital signs and the nursing notes.   HISTORY  Chief Complaint Weakness    HPI [name] [name] is a 72 y.o. male who comes to the emergency department via EMS with left-sided weakness for the past 30 minutes or so.  The patient took a nap roughly at 2 PM and when he awoke 30 minutes ago he noted numbness and weakness to the left side of his body.  He has no history of stroke but he does have a long coronary history including a quintuple bypass.  He takes aspirin daily but no blood thinning medication.  His symptoms came on suddenly and have been constant.  Nothing seems to make them better or worse.  He has pain in his left upper shoulder which is nonradiating.  Past Medical History:  Diagnosis Date  . Anginal pain (HCC)   . Arthritis   . Back pain    degenerative disc disease  . Carpal tunnel syndrome of right wrist   . CHF (congestive heart failure) (HCC)   . Coronary artery disease   . Diverticulosis   . GERD (gastroesophageal reflux disease)   . Myocardial infarction (HCC)   . Neuropathy   . Wears dentures    full upper and lower  . Wears hearing aid    bilateral    Patient Active Problem List   Diagnosis Date Noted  . 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 PAD (peripheral artery disease) (HCC) 06/26/2016  . Pain in limb 06/04/2016  . Annual physical exam 05/31/2016  . Cerumen impaction 05/31/2016  . ERRONEOUS ENCOUNTER--DISREGARD 01/29/2016  . Difficulty in swallowing   . Hiatal hernia   . Gastritis   . Heartburn 10/11/2015  . Influenza-like illness 08/18/2015  . Sinusitis, acute 07/20/2015  . Elevation of level of transaminase and lactic acid dehydrogenase (LDH) 05/11/2015  . Back pain with radiation 05/11/2015  . Cardiomyopathy (HCC)  05/11/2015  . Esophageal lesion 05/11/2015  . Pain in the wrist 05/11/2015  . Low back pain with sciatica 05/11/2015  . Heart attack (HCC) 05/11/2015  . Absent peripheral pulse 05/11/2015  . ERRONEOUS ENCOUNTER--DISREGARD 04/26/2015  . Back pain, chronic 02/03/2015  . Leg swelling 02/03/2015  . Edema leg 02/03/2015  . CAD in native artery 02/03/2015  . Narrowing of intervertebral disc space 02/03/2015  . Acid reflux 02/03/2015  . Hypercholesteremia 02/03/2015  . BP (high blood pressure) 02/03/2015  . Cardiomyopathy, ischemic 02/03/2015  . Arthritis of hand, degenerative 02/03/2015  . Insomnia secondary to chronic pain 11/14/2014  . 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 Diuretic-induced hypokalemia 11/14/2014  . History of decompression of median nerve 10/03/2014  . Acquired polyneuropathy 09/28/2014  . Carpal tunnel syndrome 09/28/2014  . Peripheral neuropathy 09/28/2014  . Chest pain 10/20/2013  . Other specified cardiac arrhythmias 08/23/2013  . H/O coronary artery bypass surgery 08/21/2013  . Chronic low back pain with right-sided sciatica 08/19/2013  . Difficulty hearing 08/19/2013  . Arteriosclerosis of coronary artery 08/19/2013  . Esophageal lump 08/19/2013    Past Surgical History:  Procedure Laterality Date  . ANKLE RECONSTRUCTION Left 1991  . APPENDECTOMY    . BACK SURGERY N/A    lower back x 3  . CARDIAC CATHETERIZATION    . CARPAL TUNNEL RELEASE Left 03/2014   In Dr. Rosita Kea office  .  CARPAL TUNNEL RELEASE Left 10/20/2014   Procedure: CARPAL TUNNEL RELEASE;  Surgeon: Kennedy Bucker, MD;  Location: [facility] ORS;  Service: Orthopedics;  Laterality: Left;  . CORONARY ARTERY BYPASS GRAFT  March 2014   [facility]  . ESOPHAGOGASTRODUODENOSCOPY (EGD) WITH PROPOFOL N/A 10/26/2015   Procedure: ESOPHAGOGASTRODUODENOSCOPY (EGD) WITH PROPOFOL with dialation;  Surgeon: [name], MD;  Location[facility];  Service: Endoscopy;  Laterality: N/A;  .  EYE SURGERY Bilateral    Cataract Extraction with IOL  . NECK SURGERY N/A    neck x 2, one discectomy and one shaved disc.  Marland Kitchen SHOULDER ARTHROSCOPY Right 06/06/2015   Procedure: right shoulder arthroscopy, arthroscopic debridement, subacromial decompression, SLAP repair, biotenodesis, mini open rotator cuff repair;  Surgeon: Christena Flake, MD;  Location: [facility] ORS;  Service: Orthopedics;  Laterality: Right;  . SHOULDER OPEN ROTATOR CUFF REPAIR     Left    Prior to Admission medications   Medication Sig Start Date End Date Taking? Authorizing Provider  aspirin 81 MG tablet Take 81 mg by mouth every morning.   Yes [provider]  dicyclomine (BENTYL) 10 MG capsule Take 1 capsule (10 mg total) by mouth 2 (two) times daily. 07/02/17  Yes Velta Addison A, MD  docusate sodium (COLACE) 100 MG capsule Take 100 mg by mouth 2 (two) times daily.   Yes [provider]  gabapentin (NEURONTIN) 300 MG capsule Take 1 capsule (300 mg total) by mouth 3 (three) times daily. Patient taking differently: Take 300 mg by mouth 2 (two) times daily.  09/15/17  Yes Lada, Janit Bern, MD  isosorbide mononitrate (IMDUR) 30 MG 24 hr tablet Take 30 mg by mouth daily.  05/04/16  Yes [provider]  Oxycodone HCl 10 MG TABS Take 1 tablet (10 mg total) by mouth every 12 (twelve) hours as needed. 07/02/17  Yes Ellyn Hack, MD  pantoprazole (PROTONIX) 40 MG tablet Take 1 tablet (40 mg total) by mouth every morning. 03/28/17  Yes Ellyn Hack, MD  cyclobenzaprine (FLEXERIL) 10 MG tablet Take 1 tablet (10 mg total) by mouth 2 (two) times daily as needed. Patient not taking: Reported on 11/09/2017 07/02/17   Ellyn Hack, MD  nitroGLYCERIN (NITROSTAT) 0.4 MG SL tablet Place 1 tablet (0.4 mg total) under the tongue every 5 (five) minutes as needed for chest pain. Max 3 doses within 15 mins 10/17/15   Ellyn Hack, MD  predniSONE (STERAPRED UNI-PAK 21 TAB) 5 MG (21) TBPK tablet 60 50 40 30 20 10   then STOP Patient not taking: Reported on 11/09/2017 07/02/17   Ellyn Hack, MD    Allergies Parafon forte dsc [chlorzoxazone]; Statins; and Cucumber extract  Family History  Problem Relation Age of Onset  . Diverticulitis Sister     Social History Social History   Tobacco Use  . Smoking status: Former Smoker    Last attempt to quit: 06/18/2013    Years since quitting: 4.3  . Smokeless tobacco: Never Used  Substance Use Topics  . Alcohol use: Yes    Alcohol/week: 0.0 - 0.6 oz    Comment: occasionally  . Drug use: No    Review of Systems Constitutional: No fever/chills Eyes: No visual changes. ENT: No sore throat. Cardiovascular: Denies chest pain. Respiratory: Denies shortness of breath. Gastrointestinal: No abdominal pain.  No nausea, no vomiting.  No diarrhea.  No constipation. Genitourinary: Negative for dysuria. Musculoskeletal: Negative for back pain. Skin:  Negative for rash. Neurological: Positive for focal weakness   ____________________________________________   PHYSICAL EXAM:  VITAL SIGNS: ED Triage Vitals [11/09/17 1717]  Enc Vitals Group     BP      Pulse      Resp      Temp      Temp Source Oral     SpO2      Weight 200 lb (90.7 kg)     Height 5\' 9"  (1.753 m)     Head Circumference      Peak Flow      Pain Score 0     Pain Loc      Pain Edu?      Excl. in GC?     Constitutional: Pleasant cooperative appears somewhat anxious Eyes: PERRL EOMI. midrange and brisk Head: Atraumatic. Nose: No congestion/rhinnorhea. Mouth/Throat: No trismus Neck: No stridor.   Cardiovascular: Normal rate, regular rhythm. Grossly normal heart sounds.  Good peripheral circulation. Respiratory: Normal respiratory effort.  No retractions. Lungs CTAB and moving good air Gastrointestinal: Soft nontender Musculoskeletal: No lower extremity edema   Neurologic: Hernial nerves II through XII intact 5 out of 5 strength right upper extremity 2 out of 5 strength  left upper extremity 5 out of 5 strength right lower extremity 1 out of 5 strength left lower extremity Skin:  Skin is warm, dry and intact. No rash noted. Psychiatric:  Somewhat anxious appearing    ____________________________________________   DIFFERENTIAL includes but not limited to  Stroke, dissection, TIA, complex migraine ____________________________________________   LABS (all labs ordered are listed, but only abnormal results are displayed)  Labs Reviewed  COMPREHENSIVE METABOLIC PANEL - Abnormal; Notable for the following components:      Result Value   Glucose, Bld 126 (*)    BUN 21 (*)    AST 46 (*)    Total Bilirubin 1.3 (*)    All other components within normal limits  GLUCOSE, CAPILLARY - Abnormal; Notable for the following components:   Glucose-Capillary 113 (*)    All other components within normal limits  MRSA PCR SCREENING  ETHANOL  PROTIME-INR  APTT  CBC  DIFFERENTIAL  TROPONIN I  URINE DRUG SCREEN, QUALITATIVE ([facility] ONLY)  URINALYSIS, ROUTINE W REFLEX MICROSCOPIC    Lab work reviewed by me with no acute disease __________________________________________  EKG  ED ECG REPORT I, Merrily Brittle, the attending physician, personally viewed and interpreted this ECG.  Date: 11/09/2017 EKG Time:  Rate: 66 Rhythm: normal sinus rhythm QRS Axis: normal Intervals: normal ST/T Wave abnormalities: normal Narrative Interpretation: no evidence of acute ischemia  ____________________________________________  RADIOLOGY  CT stroke protocol of the head with CT angiogram of the head neck reviewed by me with no acute disease noted ____________________________________________   PROCEDURES  Procedure(s) performed: no  .Critical Care Performed by: Merrily Brittle, MD Authorized by: Merrily Brittle, MD   Critical care provider statement:    Critical care time (minutes):  45   Critical care time was exclusive of:  Separately billable procedures and  treating other patients   Critical care was necessary to treat or prevent imminent or life-threatening deterioration of the following conditions:  CNS failure or compromise   Critical care was time spent personally by me on the following activities:  Development of treatment plan with patient or surrogate, discussions with consultants, evaluation of patient's response to treatment, examination of patient, obtaining history from patient or surrogate, ordering and performing treatments and interventions, ordering and review of laboratory  studies, ordering and review of radiographic studies, pulse oximetry, re-evaluation of patient's condition and review of old charts    Critical Care performed: Yes  Observation: no ____________________________________________   INITIAL IMPRESSION / ASSESSMENT AND PLAN / ED COURSE  Pertinent labs & imaging results that were available during my care of the patient were reviewed by me and considered in my medical decision making (see chart for details).  Patient arrives with left leg weakness greater than left arm weakness.  On arrival it is roughly 3-1/2 hours after she went down for a nap so even though this is potentially a wake-up stroke he would be a candidate for alteplase.  CT CT angios of the head and neck are pending.  Normal blood glucose and blood pressure within normal limits prior to transport to CT scan.     ----------------------------------------- 6:32 PM on 11/09/2017 -----------------------------------------  The patient was seen and evaluated by the tele neurologist who recommended alteplase.  The patient understood the risks of intracerebral hemorrhage and signed written consent prior to administration.  His alteplase was given roughly 4 hours after he went down for a nap.  I then discussed the case with neurologist at [facility] Dr. Otelia Limes who has graciously agreed to admit the patient to his service as a transfer as we are unable  to admit TPA patients to our hospital. ____________________________________________   ----------------------------------------- 6:38 PM on 11/09/2017 ----------------------------------------- The patient and his wife verbalized understanding and agreement with transfer.  At time of transfer he remained stable.  FINAL CLINICAL IMPRESSION(S) / ED DIAGNOSES  Final diagnoses:  Cerebrovascular accident (CVA), unspecified mechanism (HCC)      NEW MEDICATIONS STARTED DURING THIS VISIT:  Discharge Medication List as of 11/09/2017  7:10 PM       Note:  This document was prepared using Dragon voice recognition software and may include unintentional dictation errors.     Merrily Brittle, MD 11/09/17 2107

## 2017-11-10 ENCOUNTER — Encounter (HOSPITAL_COMMUNITY): Payer: Self-pay | Admitting: Neurology

## 2017-11-10 ENCOUNTER — Other Ambulatory Visit: Payer: Self-pay

## 2017-11-10 ENCOUNTER — Inpatient Hospital Stay (HOSPITAL_COMMUNITY): Payer: Medicare Other

## 2017-11-10 DIAGNOSIS — I6300 Cerebral infarction due to thrombosis of unspecified precerebral artery: Secondary | ICD-10-CM

## 2017-11-10 DIAGNOSIS — I6789 Other cerebrovascular disease: Secondary | ICD-10-CM

## 2017-11-10 DIAGNOSIS — I513 Intracardiac thrombosis, not elsewhere classified: Secondary | ICD-10-CM

## 2017-11-10 HISTORY — DX: Intracardiac thrombosis, not elsewhere classified: I51.3

## 2017-11-10 LAB — CBC
HCT: 38.4 % — ABNORMAL LOW (ref 39.0–52.0)
HEMOGLOBIN: 13.3 g/dL (ref 13.0–17.0)
MCH: 31.4 pg (ref 26.0–34.0)
MCHC: 34.6 g/dL (ref 30.0–36.0)
MCV: 90.6 fL (ref 78.0–100.0)
Platelets: 212 10*3/uL (ref 150–400)
RBC: 4.24 MIL/uL (ref 4.22–5.81)
RDW: 12.6 % (ref 11.5–15.5)
WBC: 7.1 10*3/uL (ref 4.0–10.5)

## 2017-11-10 LAB — LIPID PANEL
CHOL/HDL RATIO: 4.1 ratio
Cholesterol: 142 mg/dL (ref 0–200)
HDL: 35 mg/dL — AB (ref >40)
LDL CALC: 93 mg/dL (ref 0–99)
Triglycerides: 71 mg/dL (ref <150)
VLDL: 14 mg/dL (ref 0–40)

## 2017-11-10 LAB — ECHOCARDIOGRAM COMPLETE
Height: 69 in
WEIGHTICAEL: 3463.8675 [oz_av]

## 2017-11-10 LAB — HEMOGLOBIN A1C
HEMOGLOBIN A1C: 5.9 % — AB (ref 4.8–5.6)
Mean Plasma Glucose: 122.63 mg/dL

## 2017-11-10 LAB — HIV ANTIBODY (ROUTINE TESTING W REFLEX): HIV Screen 4th Generation wRfx: NONREACTIVE

## 2017-11-10 MED ORDER — ASPIRIN 325 MG PO TABS
325.0000 mg | ORAL_TABLET | Freq: Every day | ORAL | Status: DC
  Administered 2017-11-10 – 2017-11-11 (×2): 325 mg via ORAL
  Filled 2017-11-10 (×2): qty 1

## 2017-11-10 MED ORDER — DOCUSATE SODIUM 100 MG PO CAPS
100.0000 mg | ORAL_CAPSULE | Freq: Two times a day (BID) | ORAL | Status: DC
  Administered 2017-11-10 – 2017-11-11 (×3): 100 mg via ORAL
  Filled 2017-11-10 (×3): qty 1

## 2017-11-10 MED ORDER — PERFLUTREN LIPID MICROSPHERE
INTRAVENOUS | Status: AC
  Filled 2017-11-10: qty 10

## 2017-11-10 MED ORDER — CYCLOBENZAPRINE HCL 10 MG PO TABS
10.0000 mg | ORAL_TABLET | Freq: Two times a day (BID) | ORAL | Status: DC | PRN
  Administered 2017-11-10 (×2): 10 mg via ORAL
  Filled 2017-11-10 (×2): qty 1

## 2017-11-10 MED ORDER — GABAPENTIN 300 MG PO CAPS
300.0000 mg | ORAL_CAPSULE | Freq: Two times a day (BID) | ORAL | Status: DC
  Administered 2017-11-10 – 2017-11-11 (×3): 300 mg via ORAL
  Filled 2017-11-10 (×3): qty 1

## 2017-11-10 MED ORDER — PERFLUTREN LIPID MICROSPHERE
1.0000 mL | INTRAVENOUS | Status: AC | PRN
  Filled 2017-11-10: qty 10

## 2017-11-10 NOTE — Evaluation (Signed)
Physical Therapy Evaluation Patient Details Name: [name] [name] MRN: [id number] DOB: [birth year] Today's Date: 11/10/2017   History of Present Illness  72 yo admitted with left weakness with CT (-) but given tPA. PMHx: CABG, CTR, CHF, CAD, neuropathy  Clinical Impression  Pt able to ambulate 200 ft min guard and navigate 3 stairs but demonstrates balance deficits with functional mobility. Pt is motivated to return to work and is resistant to use of an AD. Pt would benefit from acute PT to address deficits present to improve level of independence and reduce risk of future falls. Pt education regarding safety awareness is recommended    Follow Up Recommendations Supervision/Assistance - 24 hour;Home health PT    Equipment Recommendations  None recommended by PT    Recommendations for Other Services       Precautions / Restrictions Precautions Precautions: Fall      Mobility  Bed Mobility Overal bed mobility: Modified Independent             General bed mobility comments: Increased time required and HOB elevated  Transfers Overall transfer level: Needs assistance Equipment used: Rolling walker (2 wheeled) Transfers: Sit to/from Stand Sit to Stand: Min guard         General transfer comment: guarding to stand and stabilize, pt pushing up on RLE to raise from surface. Cues for hand placement. Pt performed sit<>stand from bed and toilet (with use of grab bar)  Ambulation/Gait Ambulation/Gait assistance: Min guard Ambulation Distance (Feet): 200 Feet Assistive device: Rolling walker (2 wheeled);None Gait Pattern/deviations: Step-through pattern;Decreased stride length Gait velocity: Decreased   General Gait Details: Pt ambulated ~15 feet in room without RW and demonstrated decreased stability as evident by pt reaching for objects and increased trunk sway. Increased stability for duration of gait with RW. Cues to step into RW and for upright posture.  Stairs Stairs:  Yes Stairs assistance: Min guard Stair Management: One rail Left;Step to pattern(ascending) Number of Stairs: 3 General stair comments: Pt navigated 3 stairs with step to pattern with cueing for leading with RLE up and LLE down. Min guard for safety  Wheelchair Mobility    Modified Rankin (Stroke Patients Only)       Balance Overall balance assessment: Needs assistance Sitting-balance support: Feet supported;No upper extremity supported Sitting balance-Leahy Scale: Good       Standing balance-Leahy Scale: Fair Standing balance comment: Pt able to stand without support and tolerate mild challenge     Tandem Stance - Right Leg: 15 Tandem Stance - Left Leg: 5 Rhomberg - Eyes Opened: 30 Rhomberg - Eyes Closed: 8   High Level Balance Comments: Pt has difficulty with static balance as evident by difficulty with rhomberg (eyes closed) and tandem stance             Pertinent Vitals/Pain Pain Assessment: No/denies pain    Home Living Family/patient expects to be discharged to:: Private residence Living Arrangements: Spouse/significant other Available Help at Discharge: Family;Available PRN/intermittently Type of Home: House Home Access: Stairs to enter   Entrance Stairs-Number of Steps: 3 Home Layout: One level Home Equipment: Walker - 2 wheels;Walker - 4 wheels;Cane - single point Additional Comments: DME belongs to wife    Prior Function Level of Independence: Independent         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 Pt performed partial heel slide and partial hip abduction. In  sitting, pt able to perform partial long arc quad. no buckling noted during functional activity LLE Sensation: (Pt reports numbness but able to detect light touch)       Communication   Communication: No difficulties  Cognition Arousal/Alertness: Awake/alert Behavior During Therapy: WFL for tasks assessed/performed Overall Cognitive Status: Impaired/Different from baseline Area of Impairment: Safety/judgement                         Safety/Judgement: Decreased awareness of safety;Decreased awareness of deficits     General Comments: pt adament to return to work in a week despite balance and functional deficits      General Comments      Exercises     Assessment/Plan    PT Assessment Patient needs continued PT services  PT Problem List Decreased strength;Decreased mobility;Decreased safety awareness;Decreased activity tolerance;Decreased cognition;Decreased balance;Decreased knowledge of use of DME;Impaired sensation       PT Treatment Interventions Gait training;Therapeutic activities;Neuromuscular re-education;Stair training;Therapeutic exercise;Cognitive remediation;DME instruction;Functional mobility training;Balance training;Patient/family education    PT Goals (Current goals can be found in the Care Plan section)  Acute Rehab PT Goals Patient Stated Goal: return to work in a week PT Goal Formulation: With patient/family Time For Goal Achievement: 11/24/17 Potential to Achieve Goals: Good Additional Goals Additional Goal #1: Pt will score >52 on BBS    Frequency Min 3X/week   Barriers to discharge Decreased caregiver support      Co-evaluation               AM-PAC PT "6 Clicks" Daily Activity  Outcome Measure Difficulty turning over in bed (including adjusting bedclothes, sheets and blankets)?: A Little Difficulty moving from lying on back to sitting on the side of the bed? : A Little Difficulty sitting down on and standing up from a chair  with arms (e.g., wheelchair, bedside commode, etc,.)?: A Little Help needed moving to and from a bed to chair (including a wheelchair)?: A Little Help needed walking in hospital room?: A Little Help needed climbing 3-5 steps with a railing? : A Little 6 Click Score: 18    End of Session Equipment Utilized During Treatment: Gait belt Activity Tolerance: Patient tolerated treatment well Patient left: in chair;with call bell/phone within reach;Other (comment)(with OT) Nurse Communication: Mobility status;Precautions PT Visit Diagnosis: Other abnormalities of gait and mobility (R26.89);Other symptoms and signs involving the nervous system (R29.898)    Time: 4098-1191 PT Time Calculation (min) (ACUTE ONLY): 34 min   Charges:   PT Evaluation $PT Eval Moderate Complexity: 1 Mod PT Treatments $Gait Training: 8-22 mins   PT G Codes:        Gabe Meredyth Hornung, SPT  [employer] 11/10/2017, 12:25 PM

## 2017-11-10 NOTE — Evaluation (Signed)
Speech Language Pathology Evaluation Patient Details Name: [name] [name] MRN: [id number] DOB: 11-01-1945 Today's Date: 11/10/2017 Time: 5366-4403 SLP Time Calculation (min) (ACUTE ONLY): 18 min  Problem List:  Patient Active Problem List   Diagnosis Date Noted  . 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 PAD (peripheral artery disease) (HCC) 06/26/2016  . Pain in limb 06/04/2016  . Annual physical exam 05/31/2016  . Cerumen impaction 05/31/2016  . ERRONEOUS ENCOUNTER--DISREGARD 01/29/2016  . Difficulty in swallowing   . Hiatal hernia   . Gastritis   . Heartburn 10/11/2015  . Influenza-like illness 08/18/2015  . Sinusitis, acute 07/20/2015  . Elevation of level of transaminase and lactic acid dehydrogenase (LDH) 05/11/2015  . Back pain with radiation 05/11/2015  . Cardiomyopathy (HCC) 05/11/2015  . Esophageal lesion 05/11/2015  . Pain in the wrist 05/11/2015  . Low back pain with sciatica 05/11/2015  . Heart attack (HCC) 05/11/2015  . Absent peripheral pulse 05/11/2015  . ERRONEOUS ENCOUNTER--DISREGARD 04/26/2015  . Back pain, chronic 02/03/2015  . Leg swelling 02/03/2015  . Edema leg 02/03/2015  . CAD in native artery 02/03/2015  . Narrowing of intervertebral disc space 02/03/2015  . Acid reflux 02/03/2015  . Hypercholesteremia 02/03/2015  . BP (high blood pressure) 02/03/2015  . Cardiomyopathy, ischemic 02/03/2015  . Arthritis of hand, degenerative 02/03/2015  . Insomnia secondary to chronic pain 11/14/2014  . 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 Diuretic-induced hypokalemia 11/14/2014  . History of decompression of median nerve 10/03/2014  . Acquired polyneuropathy 09/28/2014  . Carpal tunnel syndrome 09/28/2014  . Peripheral neuropathy 09/28/2014  . Chest pain 10/20/2013  . Other specified cardiac arrhythmias 08/23/2013  . H/O coronary artery bypass surgery 08/21/2013  . Chronic low back pain with right-sided sciatica 08/19/2013  . Difficulty hearing  08/19/2013  . Arteriosclerosis of coronary artery 08/19/2013  . Esophageal lump 08/19/2013   Past Medical History:  Past Medical History:  Diagnosis Date  . Anginal pain (HCC)   . Arthritis   . Back pain    degenerative disc disease  . Carpal tunnel syndrome of right wrist   . CHF (congestive heart failure) (HCC)   . Coronary artery disease   . Diverticulosis   . GERD (gastroesophageal reflux disease)   . Myocardial infarction (HCC)   . Neuropathy   . Wears dentures    full upper and lower  . Wears hearing aid    bilateral   Past Surgical History:  Past Surgical History:  Procedure Laterality Date  . ANKLE RECONSTRUCTION Left 1991  . APPENDECTOMY    . BACK SURGERY N/A    lower back x 3  . CARDIAC CATHETERIZATION    . CARPAL TUNNEL RELEASE Left 03/2014   In Dr. Rosita Kea office  . CARPAL TUNNEL RELEASE Left 10/20/2014   Procedure: CARPAL TUNNEL RELEASE;  Surgeon: Kennedy Bucker, MD;  Location: [facility] OR[facility];  Service: Orthopedics;  Laterality: Left;  . CORONARY ARTERY BYPASS GRAFT  March 2014   [facility]  . ESOPHAGOGASTRODUODENOSCOPY (EGD) WITH PROPOFOL N/A 10/26/2015   Procedure: ESOPHAGOGASTRODUODENOSCOPY (EGD) WITH PROPOFOL with dialation;  Surgeon: [name], MD;  Location: [facility];  Service: Endoscopy;  Laterality: N/A;  . EYE SURGERY Bilateral    Cataract Extraction with IOL  . NECK SURGERY N/A    neck x 2, one discectomy and one shaved disc.  Marland Kitchen SHOULDER ARTHROSCOPY Right 06/06/2015   Procedure: right shoulder arthroscopy, arthroscopic debridement, subacromial decompression, SLAP repair, biotenodesis, mini open rotator cuff repair;  Surgeon: Christena Flake, MD;  Location: [facility];  Service: Orthopedics;  Laterality: Right;  . SHOULDER OPEN ROTATOR CUFF REPAIR     Left   HPI:  72 year old male with a history of heart failure, CAD who presented with left-sided weakness, likely attributable to small subcortical infarct per neurology.  CT negative for acute  event.  TPA administered.  Works full time with [employer] in [place].    Assessment / Plan / Recommendation Clinical Impression  Pt presents with normal cognitive-communicative function; functional memory and awareness; no dysarthria; fluent speech; good historian.  No SLP f/u is needed - our services will sign off.     SLP Assessment  SLP Recommendation/Assessment: Patient does not need any further Speech Lanaguage Pathology Services    Follow Up Recommendations    none   Frequency and Duration           SLP Evaluation Cognition  Overall Cognitive Status: Within Functional Limits for tasks assessed Arousal/Alertness: Awake/alert Orientation Level: Oriented X4 Attention: Selective Selective Attention: Appears intact Memory: Appears intact Awareness: Appears intact       Comprehension  Auditory Comprehension Overall Auditory Comprehension: Appears within functional limits for tasks assessed Visual Recognition/Discrimination Discrimination: Within Function Limits Reading Comprehension Reading Status: Within funtional limits    Expression Expression Primary Mode of Expression: Verbal Verbal Expression Overall Verbal Expression: Appears within functional limits for tasks assessed Written Expression Dominant Hand: Right Written Expression: Within Functional Limits   Oral / Motor  Oral Motor/Sensory Function Overall Oral Motor/Sensory Function: Within functional limits Motor Speech Overall Motor Speech: Appears within functional limits for tasks assessed   GO                    Blenda Mounts Laurice 11/10/2017, 10:31 AM
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
## 2017-11-10 NOTE — Evaluation (Signed)
Occupational Therapy Evaluation Patient Details Name: [name] [name] MRN: [id number] DOB: July 03, 1945 Today's Date: 11/10/2017    History of Present Illness 72 yo admitted with left weakness with CT (-) but given tPA. PMHx: CABG, CTR, CHF, CAD, neuropathy   Clinical Impression   PT admitted with L side weakness. Pt currently with functional limitiations due to the deficits listed below (see OT problem list). Pt currently fall risk due to lack of awareness and declining the use of RW. Pt with wife that requires rollator and can not assist with balance. Pt provided extensive education on fall risk and prevention. Pt educated on OT arrival by PTS Gabe "BEFAST"  Pt will benefit from skilled OT to increase their independence and safety with adls and balance to allow discharge home.     Follow Up Recommendations  No OT follow up    Equipment Recommendations  None recommended by OT    Recommendations for Other Services Other (comment)(outpatient balance)     Precautions / Restrictions Precautions Precautions: Fall      Mobility Bed Mobility Overal bed mobility: Modified Independent             General bed mobility comments: in chair onarrival  Transfers Overall transfer level: Needs assistance Equipment used: Rolling walker (2 wheeled) Transfers: Sit to/from Stand Sit to Stand: Min assist         General transfer comment: lob upon static standing from chair. pt needed bil UE on rw for stability    Balance Overall balance assessment: Needs assistance Sitting-balance support: Feet supported;No upper extremity supported Sitting balance-Leahy Scale: Good       Standing balance-Leahy Scale: Fair Standing balance comment: Pt able to stand without support and tolerate mild challenge     Tandem Stance - Right Leg: 15 Tandem Stance - Left Leg: 5 Rhomberg - Eyes Opened: 30 Rhomberg - Eyes Closed: 8   High Level Balance Comments: Pt has difficulty with static balance  as evident by difficulty with rhomberg (eyes closed) and tandem stance Standardized Balance Assessment Standardized Balance Assessment : Dynamic Gait Index   Dynamic Gait Index Level Surface: Mild Impairment Change in Gait Speed: Moderate Impairment Gait with Horizontal Head Turns: Moderate Impairment Gait with Vertical Head Turns: Moderate Impairment Gait and Pivot Turn: Moderate Impairment Step Over Obstacle: Severe Impairment Step Around Obstacles: Moderate Impairment Steps: Moderate Impairment Total Score: 8     ADL either performed or assessed with clinical judgement   ADL Overall ADL's : Needs assistance/impaired Eating/Feeding: Independent   Grooming: Independent   Upper Body Bathing: Supervision/ safety   Lower Body Bathing: Supervison/ safety       Lower Body Dressing: Min guard Lower Body Dressing Details (indicate cue type and reason): pt requires holding with 1 ue for balacne Toilet Transfer: RW;Min Psychiatrist Details (indicate cue type and reason): pt simulated transfer and LOB with stepping on one foot. pt also with sway with vision occlusion. pt educated on the need to sit Functional mobility during ADLs: Minimal assistance General ADL Comments: pt complete transfer with hand held (A) to assess balance below.      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 pt able to track in all quadrants iwth L eye     Perception     Praxis      Pertinent Vitals/Pain Pain Assessment: 0-10 Pain Score:  5  Pain Location: back Pain Descriptors / Indicators: Constant Pain Intervention(s): Monitored during session;Repositioned     Hand Dominance Right   Extremity/Trunk Assessment Upper Extremity Assessment Upper Extremity Assessment: Overall WFL for tasks assessed   Lower Extremity Assessment Lower Extremity Assessment: Defer to  PT evaluation LLE Deficits / Details: Pt able to perform supine quad set and slight SLR but required significantly more effort compared to RLE. Pt performed partial heel slide and partial hip abduction. In sitting, pt able to perform partial long arc quad. no buckling noted during functional activity LLE Sensation: (Pt reports numbness but able to detect light touch)   Cervical / Trunk Assessment Cervical / Trunk Assessment: Kyphotic   Communication Communication Communication: No difficulties   Cognition Arousal/Alertness: Awake/alert Behavior During Therapy: WFL for tasks assessed/performed Overall Cognitive Status: Impaired/Different from baseline Area of Impairment: Safety/judgement                         Safety/Judgement: Decreased awareness of safety     General Comments: pt reports that he will not use RW. pt states "i am going to throw that damn thing in the trash" pts wife present and states "NO YOU WILL NOT! You will do everything this doctor says or ill call your sister and get her to tell you"   General Comments  pt educated on fall risk with single leg standing based on evaluation such as step on a curb or up onto a step     Exercises     Shoulder Instructions      Home Living Family/patient expects to be discharged to:: Private residence Living Arrangements: Spouse/significant other Available Help at Discharge: Family;Available PRN/intermittently Type of Home: House Home Access: Stairs to enter Entrance Stairs-Number of Steps: 3   Home Layout: One level     Bathroom Shower/Tub: Tub/shower unit;Walk-in shower   Bathroom Toilet: Handicapped height     Home Equipment: Environmental consultant - 2 wheels;Walker - 4 wheels;Cane - single point   Additional Comments: DME belongs to wife  Lives With: Spouse(wife with back pain basline)    Prior Functioning/Environment Level of Independence: Independent        Comments: works in Production designer, theatre/television/film for storage units         OT Problem List: Decreased strength;Decreased activity tolerance;Impaired balance (sitting and/or standing);Decreased safety awareness;Decreased knowledge of use of DME or AE;Decreased knowledge of precautions;Obesity      OT Treatment/Interventions: Self-care/ADL training;Therapeutic exercise;Neuromuscular education;DME and/or AE instruction;Therapeutic activities;Cognitive remediation/compensation;Patient/family education;Balance training    OT Goals(Current goals can be found in the care plan section) Acute Rehab OT Goals Patient Stated Goal: to be off the walk in one week and go back to work before two weeks. pt reports "need to make money" is the reason he has to return OT Goal Formulation: With patient/family Time For Goal Achievement: 11/24/17 Potential to Achieve Goals: Good  OT Frequency: Min 2X/week   Barriers to D/C:            Co-evaluation              AM-PAC PT "6 Clicks" Daily Activity     Outcome Measure Help from another person eating meals?: None Help from another person taking care of personal grooming?: None Help from another person toileting, which includes using toliet, bedpan, or urinal?: A Little Help from another person bathing (including washing, rinsing, drying)?: A Little Help from another person to put on and taking off regular upper  body clothing?: A Little Help from another person to put on and taking off regular lower body clothing?: A Little 6 Click Score: 20   End of Session Equipment Utilized During Treatment: Gait belt Nurse Communication: Mobility status;Precautions  Activity Tolerance: Patient tolerated treatment well Patient left: in chair;with family/visitor present;with call bell/phone within reach  OT Visit Diagnosis: Unsteadiness on feet (R26.81);Muscle weakness (generalized) (M62.81)                Time: 1610-9604 OT Time Calculation (min): 23 min Charges:  OT General Charges $OT Visit: 1 Visit OT Evaluation $OT Eval  Moderate Complexity: 1 Mod G-Codes:      [name]   OTR/L Pager: [phone] Office: [phone] .   [name] 11/10/2017, 3:36 PM

## 2017-11-10 NOTE — Progress Notes (Signed)
PT Cancellation Note  Patient Details Name: [name] [name] MRN: [id number] DOB: 05-16-1946   Cancelled Treatment:    Reason Eval/Treat Not Completed: Active bedrest order   [place] Markey B Mareta Chesnut 11/10/2017, 7:26 AM  Delaney Meigs, PT [phone]

## 2017-11-10 NOTE — Progress Notes (Signed)
  Echocardiogram 2D Echocardiogram has been performed.  Jakalyn Kratky G Justise Ehmann 11/10/2017, 2:39 PM

## 2017-11-10 NOTE — Progress Notes (Signed)
Pt addiment to get oob and use the bathroom. Dr. Pearlean Brownie stated he could if PT worked with him and determined it was safe. Maija, PT notified of plan. Will await eval results.

## 2017-11-10 NOTE — Progress Notes (Signed)
Pt has ambulated hall 4 times today. Stable neuro exams. Notified Dr. Wilford Corner about pt transporting to CT without RN and transferring to 3W. Dr. Wilford Corner gave verbal order for both.

## 2017-11-11 DIAGNOSIS — E669 Obesity, unspecified: Secondary | ICD-10-CM

## 2017-11-11 DIAGNOSIS — I639 Cerebral infarction, unspecified: Secondary | ICD-10-CM

## 2017-11-11 MED ORDER — ASPIRIN 81 MG PO TABS: 81.0000 mg | ORAL_TABLET | ORAL | 0 refills | Status: AC

## 2017-11-11 MED ORDER — CLOPIDOGREL BISULFATE 75 MG PO TABS
75.0000 mg | ORAL_TABLET | Freq: Every day | ORAL | Status: DC
  Administered 2017-11-11: 75 mg via ORAL
  Filled 2017-11-11: qty 1

## 2017-11-11 MED ORDER — CLOPIDOGREL BISULFATE 75 MG PO TABS
75.0000 mg | ORAL_TABLET | Freq: Every day | ORAL | 2 refills | Status: DC
Start: 2017-11-11 — End: 2018-05-08

## 2017-11-11 MED ORDER — ASPIRIN EC 81 MG PO TBEC: 81.0000 mg | DELAYED_RELEASE_TABLET | Freq: Every day | ORAL | Status: DC

## 2017-11-11 MED ORDER — GABAPENTIN 300 MG PO CAPS: 300.0000 mg | ORAL_CAPSULE | Freq: Two times a day (BID) | ORAL | Status: DC

## 2017-11-11 NOTE — Discharge Summary (Addendum)
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  I have made any additions or clarifications directly to the above note. Agree with note above.   Delia Heady, MD Medical Director [facility] Pager: [phone] 11/11/2017 2:35 PM

## 2017-11-11 NOTE — Progress Notes (Signed)
Nurse went over discharge with patient and family . Patient and family verbalized understanding of discharge. All questions and concerns addressed. Patient discharging home with all belongings. Taken down in a wheelchair.

## 2017-11-11 NOTE — Care Management Note (Addendum)
Case Management Note  Patient Details  Name: [name] [name] MRN: [id number] Date of Birth: December 25, 1945  Subjective/Objective:      Pt admitted to r/o CVA. He is from home with spouse. Pt s/p TPA.             PCP: Dr Marcello Fennel  Action/Plan: PT recommending HH services. No f/u per OT and no DME needs. CM following.  Addendum: pt discharging home with self care. Pt plans on returning to work on Monday so will not qualify for [facility] services. CM inquired about outpatient therapy. Pt refusing. Pt states he has transport home today.   Expected Discharge Date:                  Expected Discharge Plan:  Home/Self Care  In-House Referral:     Discharge planning Services     Post Acute Care Choice:    Choice offered to:     DME Arranged:    DME Agency:     HH Arranged:    HH Agency:     Status of Service:  In process, will continue to follow  If discussed at Long Length of Stay Meetings, dates discussed:    Additional Comments:  Kermit Balo, RN 11/11/2017, 11:46 AM

## 2017-11-11 NOTE — Progress Notes (Signed)
Occupational Therapy Treatment Patient Details Name: [name] [name] MRN: [id number] DOB: 08-18-1945 Today's Date: 11/11/2017    History of present illness 72 yo admitted with left weakness with CT (-) but given tPA. PMHx: CABG, CTR, CHF, CAD, neuropathy   OT comments  Pt demonstrates sink level task this session min guard (A) and poor awareness to fall risk. Pt prefers to furniture walk in the room instead of RW use. Pt fall risk at this time.   Follow Up Recommendations  No OT follow up    Equipment Recommendations  None recommended by OT    Recommendations for Other Services Other (comment)    Precautions / Restrictions Precautions Precautions: Fall       Mobility Bed Mobility               General bed mobility comments: sitting EOB on arrival  Transfers Overall transfer level: Needs assistance   Transfers: Sit to/from Stand Sit to Stand: Min guard         General transfer comment: reaching for IV pole for support then sink surface. pt states "aww i dont need that" but reachings for environmental support instead of RW    Balance             Standing balance-Leahy Scale: Fair Standing balance comment: requires 1 UE support during task for balance. pt unsteady with bil Ue use at sink. pt leaning into sink anteriorly for balance                           ADL either performed or assessed with clinical judgement   ADL Overall ADL's : Needs assistance/impaired Eating/Feeding: Independent   Grooming: Independent Grooming Details (indicate cue type and reason): able to wash face apply dentures and glue to dentures without deficits         Upper Body Dressing : Supervision/safety       Toilet Transfer: Min Pension scheme manager Details (indicate cue type and reason): reaching for environmental support and pushing RW out of the way         Functional mobility during ADLs: Min guard General ADL Comments: pt demonstrates furniture  walking in the room during session and avoiding the RW. pt educated again on fall risk. pt upset regarding electronic tablet that was wet from being in a bag with dentures during transport from 4N unit. Director notified at this time by OT     Vision       Perception     Praxis      Cognition Arousal/Alertness: Awake/alert Behavior During Therapy: WFL for tasks assessed/performed Overall Cognitive Status: Impaired/Different from baseline Area of Impairment: Safety/judgement                         Safety/Judgement: Decreased awareness of safety     General Comments: pt not using RW on arrival and reports "i am forcing that L side to work even if it doesnt want to do it"        Exercises     Shoulder Instructions       General Comments      Pertinent Vitals/ Pain       Pain Assessment: No/denies pain  Home Living  Prior Functioning/Environment              Frequency  Min 2X/week        Progress Toward Goals  OT Goals(current goals can now be found in the care plan section)  Progress towards OT goals: Progressing toward goals  Acute Rehab OT Goals Patient Stated Goal: to return to work in a week OT Goal Formulation: With patient/family Time For Goal Achievement: 11/24/17 Potential to Achieve Goals: Good ADL Goals Pt Will Perform Lower Body Dressing: with modified independence;sit to/from stand Additional ADL Goal #1: pt will complete a full adl at sink level with dynamic balance mod I(min guard this session)  Plan Discharge plan remains appropriate    Co-evaluation                 AM-PAC PT "6 Clicks" Daily Activity     Outcome Measure   Help from another person eating meals?: None Help from another person taking care of personal grooming?: None Help from another person toileting, which includes using toliet, bedpan, or urinal?: A Little Help from another person bathing  (including washing, rinsing, drying)?: A Little Help from another person to put on and taking off regular upper body clothing?: None Help from another person to put on and taking off regular lower body clothing?: A Little 6 Click Score: 21    End of Session    OT Visit Diagnosis: Unsteadiness on feet (R26.81);Muscle weakness (generalized) (M62.81)   Activity Tolerance Patient tolerated treatment well   Patient Left in chair;with call bell/phone within reach   Nurse Communication Mobility status;Precautions        Time: 0881-1031 OT Time Calculation (min): 17 min  Charges: OT General Charges $OT Visit: 1 Visit OT Treatments $Self Care/Home Management : 8-22 mins   [name]   OTR/L Pager: [phone] Office: [phone] .    [name] 11/11/2017, 9:53 AM

## 2017-11-12 NOTE — Progress Notes (Signed)
Late entry for missed Modified Rankin Score.  Score based on review of PT and OT notes.    11/10/17 1530  Modified Rankin (Stroke Patients Only)  Pre-Morbid Rankin Score 0  Modified Rankin [address] [place], [employer]  [phone] 11/12/2017

## 2018-02-06 ENCOUNTER — Other Ambulatory Visit: Payer: Self-pay

## 2018-02-06 NOTE — Patient Outreach (Signed)
Telephone outreach to patient to obtain mRS was successfully completed. mRS = 0 

## 2018-04-29 ENCOUNTER — Inpatient Hospital Stay (HOSPITAL_COMMUNITY)
Admission: EM | Admit: 2018-04-29 | Discharge: 2018-05-08 | DRG: 519 | Disposition: A | Discharge status: Home Health | Payer: Medicare Other | Attending: Internal Medicine | Admitting: Internal Medicine

## 2018-04-29 ENCOUNTER — Encounter (HOSPITAL_COMMUNITY): Payer: Self-pay

## 2018-04-29 ENCOUNTER — Emergency Department (HOSPITAL_COMMUNITY): Payer: Medicare Other

## 2018-04-29 DIAGNOSIS — M5441 Lumbago with sciatica, right side: Secondary | ICD-10-CM

## 2018-04-29 DIAGNOSIS — E669 Obesity, unspecified: Secondary | ICD-10-CM | POA: Diagnosis present

## 2018-04-29 DIAGNOSIS — R339 Retention of urine, unspecified: Secondary | ICD-10-CM | POA: Diagnosis present

## 2018-04-29 DIAGNOSIS — Z974 Presence of external hearing-aid: Secondary | ICD-10-CM | POA: Diagnosis not present

## 2018-04-29 DIAGNOSIS — I10 Essential (primary) hypertension: Secondary | ICD-10-CM | POA: Diagnosis not present

## 2018-04-29 DIAGNOSIS — I639 Cerebral infarction, unspecified: Secondary | ICD-10-CM | POA: Diagnosis not present

## 2018-04-29 DIAGNOSIS — Z972 Presence of dental prosthetic device (complete) (partial): Secondary | ICD-10-CM

## 2018-04-29 DIAGNOSIS — I2510 Atherosclerotic heart disease of native coronary artery without angina pectoris: Secondary | ICD-10-CM | POA: Diagnosis present

## 2018-04-29 DIAGNOSIS — Z7902 Long term (current) use of antithrombotics/antiplatelets: Secondary | ICD-10-CM | POA: Diagnosis not present

## 2018-04-29 DIAGNOSIS — Z7982 Long term (current) use of aspirin: Secondary | ICD-10-CM

## 2018-04-29 DIAGNOSIS — Z87891 Personal history of nicotine dependence: Secondary | ICD-10-CM

## 2018-04-29 DIAGNOSIS — Z9842 Cataract extraction status, left eye: Secondary | ICD-10-CM

## 2018-04-29 DIAGNOSIS — K219 Gastro-esophageal reflux disease without esophagitis: Secondary | ICD-10-CM | POA: Diagnosis present

## 2018-04-29 DIAGNOSIS — G8314 Monoplegia of lower limb affecting left nondominant side: Secondary | ICD-10-CM

## 2018-04-29 DIAGNOSIS — M48061 Spinal stenosis, lumbar region without neurogenic claudication: Secondary | ICD-10-CM | POA: Diagnosis present

## 2018-04-29 DIAGNOSIS — G8929 Other chronic pain: Secondary | ICD-10-CM | POA: Diagnosis present

## 2018-04-29 DIAGNOSIS — Z833 Family history of diabetes mellitus: Secondary | ICD-10-CM

## 2018-04-29 DIAGNOSIS — Z961 Presence of intraocular lens: Secondary | ICD-10-CM | POA: Diagnosis present

## 2018-04-29 DIAGNOSIS — M4316 Spondylolisthesis, lumbar region: Secondary | ICD-10-CM | POA: Diagnosis present

## 2018-04-29 DIAGNOSIS — L89892 Pressure ulcer of other site, stage 2: Secondary | ICD-10-CM | POA: Diagnosis present

## 2018-04-29 DIAGNOSIS — Z801 Family history of malignant neoplasm of trachea, bronchus and lung: Secondary | ICD-10-CM

## 2018-04-29 DIAGNOSIS — Z419 Encounter for procedure for purposes other than remedying health state, unspecified: Secondary | ICD-10-CM

## 2018-04-29 DIAGNOSIS — Z951 Presence of aortocoronary bypass graft: Secondary | ICD-10-CM

## 2018-04-29 DIAGNOSIS — I739 Peripheral vascular disease, unspecified: Secondary | ICD-10-CM | POA: Diagnosis present

## 2018-04-29 DIAGNOSIS — K228 Other specified diseases of esophagus: Secondary | ICD-10-CM | POA: Diagnosis not present

## 2018-04-29 DIAGNOSIS — Z6831 Body mass index (BMI) 31.0-31.9, adult: Secondary | ICD-10-CM | POA: Diagnosis not present

## 2018-04-29 DIAGNOSIS — K5900 Constipation, unspecified: Secondary | ICD-10-CM | POA: Diagnosis present

## 2018-04-29 DIAGNOSIS — K2289 Other specified disease of esophagus: Secondary | ICD-10-CM

## 2018-04-29 DIAGNOSIS — Z7289 Other problems related to lifestyle: Secondary | ICD-10-CM

## 2018-04-29 DIAGNOSIS — I252 Old myocardial infarction: Secondary | ICD-10-CM | POA: Diagnosis not present

## 2018-04-29 DIAGNOSIS — M5416 Radiculopathy, lumbar region: Secondary | ICD-10-CM | POA: Diagnosis present

## 2018-04-29 DIAGNOSIS — R29898 Other symptoms and signs involving the musculoskeletal system: Secondary | ICD-10-CM

## 2018-04-29 DIAGNOSIS — Z79899 Other long term (current) drug therapy: Secondary | ICD-10-CM

## 2018-04-29 DIAGNOSIS — I428 Other cardiomyopathies: Secondary | ICD-10-CM | POA: Diagnosis present

## 2018-04-29 DIAGNOSIS — G629 Polyneuropathy, unspecified: Secondary | ICD-10-CM | POA: Diagnosis present

## 2018-04-29 DIAGNOSIS — Z9841 Cataract extraction status, right eye: Secondary | ICD-10-CM

## 2018-04-29 DIAGNOSIS — Z888 Allergy status to other drugs, medicaments and biological substances status: Secondary | ICD-10-CM

## 2018-04-29 DIAGNOSIS — L8990 Pressure ulcer of unspecified site, unspecified stage: Secondary | ICD-10-CM

## 2018-04-29 DIAGNOSIS — Z713 Dietary counseling and surveillance: Secondary | ICD-10-CM

## 2018-04-29 DIAGNOSIS — Z8673 Personal history of transient ischemic attack (TIA), and cerebral infarction without residual deficits: Secondary | ICD-10-CM

## 2018-04-29 DIAGNOSIS — M2578 Osteophyte, vertebrae: Secondary | ICD-10-CM | POA: Diagnosis present

## 2018-04-29 DIAGNOSIS — Z91018 Allergy to other foods: Secondary | ICD-10-CM

## 2018-04-29 HISTORY — DX: Cerebral infarction, unspecified: I63.9

## 2018-04-29 LAB — COMPREHENSIVE METABOLIC PANEL
ALT: 55 U/L — ABNORMAL HIGH (ref 0–44)
AST: 42 U/L — ABNORMAL HIGH (ref 15–41)
Albumin: 3.8 g/dL (ref 3.5–5.0)
Alkaline Phosphatase: 72 U/L (ref 38–126)
Anion gap: 7 (ref 5–15)
BUN: 19 mg/dL (ref 8–23)
CO2: 26 mmol/L (ref 22–32)
Calcium: 9.5 mg/dL (ref 8.9–10.3)
Chloride: 104 mmol/L (ref 98–111)
Creatinine, Ser: 0.78 mg/dL (ref 0.61–1.24)
GFR calc Af Amer: >60 mL/min (ref >60)
GFR calc non Af Amer: >60 mL/min (ref >60)
Glucose, Bld: 162 mg/dL — ABNORMAL HIGH (ref 70–99)
Potassium: 4 mmol/L (ref 3.5–5.1)
Sodium: 137 mmol/L (ref 135–145)
Total Bilirubin: 0.9 mg/dL (ref 0.3–1.2)
Total Protein: 6.9 g/dL (ref 6.5–8.1)

## 2018-04-29 LAB — DIFFERENTIAL
Abs Immature Granulocytes: 0.08 10*3/uL — ABNORMAL HIGH (ref 0.00–0.07)
Basophils Absolute: 0.1 10*3/uL (ref 0.0–0.1)
Basophils Relative: 1 %
Eosinophils Absolute: 0.3 10*3/uL (ref 0.0–0.5)
Eosinophils Relative: 3 %
Immature Granulocytes: 1 %
Lymphocytes Relative: 39 %
Lymphs Abs: 3.4 10*3/uL (ref 0.7–4.0)
Monocytes Absolute: 1 10*3/uL (ref 0.1–1.0)
Monocytes Relative: 11 %
Neutro Abs: 4 10*3/uL (ref 1.7–7.7)
Neutrophils Relative %: 45 %

## 2018-04-29 LAB — CBC
HCT: 41.6 % (ref 39.0–52.0)
Hemoglobin: 14.3 g/dL (ref 13.0–17.0)
MCH: 31.6 pg (ref 26.0–34.0)
MCHC: 34.4 g/dL (ref 30.0–36.0)
MCV: 91.8 fL (ref 80.0–100.0)
Platelets: 230 10*3/uL (ref 150–400)
RBC: 4.53 MIL/uL (ref 4.22–5.81)
RDW: 12.7 % (ref 11.5–15.5)
WBC: 8.8 10*3/uL (ref 4.0–10.5)
nRBC: 0 % (ref 0.0–0.2)

## 2018-04-29 LAB — I-STAT TROPONIN, ED: Troponin i, poc: 0.01 ng/mL (ref 0.00–0.08)

## 2018-04-29 LAB — I-STAT CHEM 8, ED
BUN: 21 mg/dL (ref 8–23)
Calcium, Ion: 1.18 mmol/L (ref 1.15–1.40)
Chloride: 101 mmol/L (ref 98–111)
Creatinine, Ser: 0.7 mg/dL (ref 0.61–1.24)
Glucose, Bld: 158 mg/dL — ABNORMAL HIGH (ref 70–99)
HCT: 42 % (ref 39.0–52.0)
Hemoglobin: 14.3 g/dL (ref 13.0–17.0)
Potassium: 4 mmol/L (ref 3.5–5.1)
Sodium: 138 mmol/L (ref 135–145)
TCO2: 29 mmol/L (ref 22–32)

## 2018-04-29 LAB — PROTIME-INR
INR: 0.96
Prothrombin Time: 12.7 seconds (ref 11.4–15.2)

## 2018-04-29 LAB — APTT: aPTT: 27 seconds (ref 24–36)

## 2018-04-29 MED ORDER — ENOXAPARIN SODIUM 40 MG/0.4ML ~~LOC~~ SOLN
40.0000 mg | Freq: Every day | SUBCUTANEOUS | Status: DC
  Administered 2018-04-30 – 2018-05-04 (×5): 40 mg via SUBCUTANEOUS
  Filled 2018-04-29 (×9): qty 0.4

## 2018-04-29 MED ORDER — SODIUM CHLORIDE 0.9 % IV BOLUS
500.0000 mL | Freq: Once | INTRAVENOUS | Status: AC
  Administered 2018-04-29: 500 mL via INTRAVENOUS

## 2018-04-29 MED ORDER — CYCLOBENZAPRINE HCL 10 MG PO TABS
10.0000 mg | ORAL_TABLET | Freq: Every day | ORAL | Status: DC
  Administered 2018-04-30: 10 mg via ORAL
  Filled 2018-04-29: qty 1

## 2018-04-29 MED ORDER — ACETAMINOPHEN 650 MG RE SUPP: 650.0000 mg | RECTAL | Status: DC | PRN

## 2018-04-29 MED ORDER — ASPIRIN 300 MG RE SUPP: 300.0000 mg | Freq: Every day | RECTAL | Status: DC

## 2018-04-29 MED ORDER — ISOSORBIDE MONONITRATE ER 30 MG PO TB24
30.0000 mg | ORAL_TABLET | Freq: Every day | ORAL | Status: DC
  Administered 2018-04-30 – 2018-05-08 (×8): 30 mg via ORAL
  Filled 2018-04-29 (×8): qty 1

## 2018-04-29 MED ORDER — DICYCLOMINE HCL 10 MG PO CAPS
10.0000 mg | ORAL_CAPSULE | Freq: Two times a day (BID) | ORAL | Status: DC
  Administered 2018-04-30 – 2018-05-08 (×15): 10 mg via ORAL
  Filled 2018-04-29 (×17): qty 1

## 2018-04-29 MED ORDER — OXYCODONE HCL 5 MG PO TABS
10.0000 mg | ORAL_TABLET | Freq: Every day | ORAL | Status: DC
  Administered 2018-04-30: 10 mg via ORAL
  Filled 2018-04-29: qty 2

## 2018-04-29 MED ORDER — ACETAMINOPHEN 325 MG PO TABS
650.0000 mg | ORAL_TABLET | ORAL | Status: DC | PRN
  Administered 2018-04-30: 650 mg via ORAL
  Filled 2018-04-29: qty 2

## 2018-04-29 MED ORDER — ACETAMINOPHEN 160 MG/5ML PO SOLN: 650.0000 mg | ORAL | Status: DC | PRN

## 2018-04-29 MED ORDER — STROKE: EARLY STAGES OF RECOVERY BOOK
Freq: Once | Status: AC
  Administered 2018-04-30: 01:00:00
  Filled 2018-04-29 (×2): qty 1

## 2018-04-29 MED ORDER — ENOXAPARIN SODIUM 40 MG/0.4ML ~~LOC~~ SOLN
40.0000 mg | SUBCUTANEOUS | Status: DC
  Filled 2018-04-29: qty 0.4

## 2018-04-29 MED ORDER — GABAPENTIN 300 MG PO CAPS: 300.0000 mg | ORAL_CAPSULE | Freq: Two times a day (BID) | ORAL | Status: DC

## 2018-04-29 MED ORDER — SODIUM CHLORIDE 0.9 % IV SOLN
INTRAVENOUS | Status: AC
  Administered 2018-04-30 (×2): via INTRAVENOUS

## 2018-04-29 MED ORDER — PANTOPRAZOLE SODIUM 40 MG PO TBEC
40.0000 mg | DELAYED_RELEASE_TABLET | Freq: Every day | ORAL | Status: DC
  Administered 2018-04-30 – 2018-05-08 (×8): 40 mg via ORAL
  Filled 2018-04-29 (×8): qty 1

## 2018-04-29 MED ORDER — ASPIRIN 325 MG PO TABS
325.0000 mg | ORAL_TABLET | Freq: Every day | ORAL | Status: DC
  Administered 2018-04-30: 325 mg via ORAL
  Filled 2018-04-29: qty 1
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
## 2018-04-29 NOTE — ED Triage Notes (Signed)
Pt coming by ems due to stroke symptoms. Pt states about 3 months had left sided weakness and had a stroke at that time. Ems states the symptoms were the same today and was in the shower and could not walk and had left leg weakness.

## 2018-04-29 NOTE — Code Documentation (Signed)
Responded to Code Stroke called at 2105.  Pt arrived at 2122 with c/o L leg weakness/pain.  NIH-3, CBG-162, LSN-2000. CT negative for acute abnormalities. TPA discussed however pt declined.  Plan to admit to medical team.

## 2018-04-30 ENCOUNTER — Other Ambulatory Visit: Payer: Self-pay

## 2018-04-30 ENCOUNTER — Encounter (HOSPITAL_COMMUNITY): Payer: Self-pay | Admitting: *Deleted

## 2018-04-30 DIAGNOSIS — L8990 Pressure ulcer of unspecified site, unspecified stage: Secondary | ICD-10-CM

## 2018-04-30 DIAGNOSIS — I739 Peripheral vascular disease, unspecified: Secondary | ICD-10-CM

## 2018-04-30 LAB — LIPID PANEL
CHOL/HDL RATIO: 4.1 ratio
Cholesterol: 156 mg/dL (ref 0–200)
HDL: 38 mg/dL — ABNORMAL LOW (ref >40)
LDL CALC: 91 mg/dL (ref 0–99)
Triglycerides: 133 mg/dL (ref <150)
VLDL: 27 mg/dL (ref 0–40)

## 2018-04-30 LAB — COMPREHENSIVE METABOLIC PANEL
ALT: 49 U/L — ABNORMAL HIGH (ref 0–44)
ANION GAP: 3 — AB (ref 5–15)
AST: 37 U/L (ref 15–41)
Albumin: 3.5 g/dL (ref 3.5–5.0)
Alkaline Phosphatase: 66 U/L (ref 38–126)
BUN: 17 mg/dL (ref 8–23)
CHLORIDE: 104 mmol/L (ref 98–111)
CO2: 29 mmol/L (ref 22–32)
Calcium: 9.2 mg/dL (ref 8.9–10.3)
Creatinine, Ser: 0.94 mg/dL (ref 0.61–1.24)
GFR calc non Af Amer: >60 mL/min (ref >60)
Glucose, Bld: 160 mg/dL — ABNORMAL HIGH (ref 70–99)
POTASSIUM: 4.2 mmol/L (ref 3.5–5.1)
Sodium: 136 mmol/L (ref 135–145)
Total Bilirubin: 0.9 mg/dL (ref 0.3–1.2)
Total Protein: 6.1 g/dL — ABNORMAL LOW (ref 6.5–8.1)

## 2018-04-30 LAB — CBC
HEMATOCRIT: 40.2 % (ref 39.0–52.0)
HEMOGLOBIN: 13.3 g/dL (ref 13.0–17.0)
MCH: 30.6 pg (ref 26.0–34.0)
MCHC: 33.1 g/dL (ref 30.0–36.0)
MCV: 92.4 fL (ref 80.0–100.0)
NRBC: 0 % (ref 0.0–0.2)
Platelets: 235 10*3/uL (ref 150–400)
RBC: 4.35 MIL/uL (ref 4.22–5.81)
RDW: 12.6 % (ref 11.5–15.5)
WBC: 7.4 10*3/uL (ref 4.0–10.5)

## 2018-04-30 LAB — HEMOGLOBIN A1C
Hgb A1c MFr Bld: 6.2 % — ABNORMAL HIGH (ref 4.8–5.6)
MEAN PLASMA GLUCOSE: 131.24 mg/dL

## 2018-04-30 LAB — TROPONIN I

## 2018-04-30 MED ORDER — GABAPENTIN 300 MG PO CAPS
600.0000 mg | ORAL_CAPSULE | Freq: Every day | ORAL | Status: DC
  Administered 2018-04-30 – 2018-05-07 (×9): 600 mg via ORAL
  Filled 2018-04-30 (×9): qty 2

## 2018-04-30 MED ORDER — OXYCODONE HCL 5 MG PO TABS
10.0000 mg | ORAL_TABLET | Freq: Four times a day (QID) | ORAL | Status: DC | PRN
  Administered 2018-04-30 – 2018-05-08 (×15): 10 mg via ORAL
  Filled 2018-04-30 (×15): qty 2

## 2018-04-30 MED ORDER — SENNOSIDES-DOCUSATE SODIUM 8.6-50 MG PO TABS
1.0000 | ORAL_TABLET | Freq: Every evening | ORAL | Status: DC | PRN
  Administered 2018-05-01: 2 via ORAL
  Administered 2018-05-04: 1 via ORAL
  Filled 2018-04-30: qty 1
  Filled 2018-04-30: qty 2

## 2018-04-30 MED ORDER — GABAPENTIN 300 MG PO CAPS
300.0000 mg | ORAL_CAPSULE | Freq: Every day | ORAL | Status: DC
  Administered 2018-04-30 – 2018-05-08 (×9): 300 mg via ORAL
  Filled 2018-04-30 (×9): qty 1

## 2018-04-30 MED ORDER — ASPIRIN EC 81 MG PO TBEC
81.0000 mg | DELAYED_RELEASE_TABLET | Freq: Every day | ORAL | Status: DC
  Administered 2018-05-01 – 2018-05-02 (×2): 81 mg via ORAL
  Filled 2018-04-30 (×2): qty 1

## 2018-04-30 MED ORDER — CYCLOBENZAPRINE HCL 10 MG PO TABS
5.0000 mg | ORAL_TABLET | Freq: Three times a day (TID) | ORAL | Status: DC | PRN
  Administered 2018-04-30 – 2018-05-05 (×8): 5 mg via ORAL
  Filled 2018-04-30 (×9): qty 1

## 2018-04-30 NOTE — Evaluation (Signed)
Physical Therapy Evaluation Patient Details Name: [name] [name] MRN: [id number] DOB: [birth year] Today's Date: 04/30/2018   History of Present Illness  72 y.o. male admitted on 04/30/18 for L leg weakness.  Suspected stroke.  CT was negative, unable to preform MRI due to metal in his body.  Pt refused tPA.  Pt with significant PMH of HOH, stroke, CAD, CHF, back pain, L RTC repair, neck surgery, back surgery, ankle reconstruction.    Clinical Impression  Pt with painful L leg.  Weakness related to pain, sensation is intact. He does well with the use of a RW.  He would benefit from post acute therapy at home.  Wife is intermittently available as she works.   PT to follow acutely for deficits listed below.   I have discussed the patient's current level of function related to his stroke  with the patient (no family present).  He acknowledge understanding of this and feels he will have the level of care needed at home.        Follow Up Recommendations Home health PT;Supervision for mobility/OOB    Equipment Recommendations  None recommended by PT    Recommendations for Other Services   NA    Precautions / Restrictions Precautions Precautions: Fall Precaution Comments: left leg pain/weakness      Mobility  Bed Mobility Overal bed mobility: Modified Independent                Transfers Overall transfer level: Needs assistance Equipment used: Rolling walker (2 wheeled) Transfers: Sit to/from Stand Sit to Stand: Supervision         General transfer comment: supervision for safety  Ambulation/Gait Ambulation/Gait assistance: Min guard Gait Distance (Feet): 15 Feet Assistive device: Rolling walker (2 wheeled) Gait Pattern/deviations: Step-through pattern;Antalgic     General Gait Details: Pt with antalgic gait pattern favoring his left leg/hip. Min guard assist for safety, reliant on UE supported on RW.       Modified Rankin (Stroke Patients Only) Modified  Rankin (Stroke Patients Only) Pre-Morbid Rankin Score: No symptoms Modified Rankin: Moderately severe disability     Balance Overall balance assessment: Needs assistance Sitting-balance support: Feet supported;No upper extremity supported Sitting balance-Leahy Scale: Good     Standing balance support: Bilateral upper extremity supported;Single extremity supported;No upper extremity supported Standing balance-Leahy Scale: Poor Standing balance comment: needs external support in standing.                              Pertinent Vitals/Pain Pain Assessment: Faces Faces Pain Scale: Hurts even more Pain Location: left leg/lateral hip Pain Descriptors / Indicators: Aching;Burning Pain Intervention(s): Limited activity within patient's tolerance;Monitored during session;Repositioned    Home Living Family/patient expects to be discharged to:: Private residence Living Arrangements: Spouse/significant other Available Help at Discharge: Family;Available PRN/intermittently Type of Home: House Home Access: Stairs to enter   Entrance Stairs-Number of Steps: 3 Home Layout: One level Home Equipment: Walker - 2 wheels;Walker - 4 wheels;Cane - single point Additional Comments: DME belongs to wife    Prior Function Level of Independence: Independent         Comments: works in Production designer, theatre/television/film for [employer], occupational Dominance   Dominant Hand: Right    Extremity/Trunk Assessment   Upper Extremity Assessment Upper Extremity Assessment: Defer to OT evaluation    Lower Extremity Assessment Lower Extremity Assessment: LLE deficits/detail LLE Deficits / Details: left leg is limited by pain,  but at least 3+/5 per bed level and gross functional assessment LLE Sensation: WNL    Cervical / Trunk Assessment Cervical / Trunk Assessment: Other exceptions Cervical / Trunk Exceptions: h/o neck and back surgery  Communication   Communication: HOH(bil hearing aids)  Cognition  Arousal/Alertness: Awake/alert Behavior During Therapy: WFL for tasks assessed/performed Overall Cognitive Status: Within Functional Limits for tasks assessed                                 General Comments: Not specifically tested, conversation normal.              Assessment/Plan    PT Assessment Patient needs continued PT services  PT Problem List Decreased strength;Decreased range of motion;Decreased activity tolerance;Decreased balance;Decreased mobility;Decreased knowledge of use of DME;Pain       PT Treatment Interventions DME instruction;Gait training;Stair training;Functional mobility training;Therapeutic activities;Therapeutic exercise;Balance training;Patient/family education    PT Goals (Current goals can be found in the Care Plan section)  Acute Rehab PT Goals Patient Stated Goal: to decrease his left leg pain PT Goal Formulation: With patient Time For Goal Achievement: 05/14/18 Potential to Achieve Goals: Good    Frequency Min 4X/week           AM-PAC PT "6 Clicks" Mobility  Outcome Measure Help needed turning from your back to your side while in a flat bed without using bedrails?: None Help needed moving from lying on your back to sitting on the side of a flat bed without using bedrails?: None Help needed moving to and from a bed to a chair (including a wheelchair)?: None Help needed standing up from a chair using your arms (e.g., wheelchair or bedside chair)?: None Help needed to walk in hospital room?: A Little Help needed climbing 3-5 steps with a railing? : A Little 6 Click Score: 22    End of Session Equipment Utilized During Treatment: Gait belt Activity Tolerance: Patient limited by pain Patient left: in chair;with call bell/phone within reach;with chair alarm set   PT Visit Diagnosis: Muscle weakness (generalized) (M62.81);Difficulty in walking, not elsewhere classified (R26.2);Pain Pain - Right/Left: Left Pain - part of body:  Leg    Time: 1030-1057 PT Time Calculation (min) (ACUTE ONLY): 27 min   Charges:             Lurena Joiner B. Said Rueb, PT, DPT  Acute Rehabilitation [phone] pager #[phone] office   PT Evaluation $PT Eval Moderate Complexity: 1 Mod PT Treatments $Therapeutic Activity: 8-22 mins       04/30/2018, 10:58 AM

## 2018-04-30 NOTE — Progress Notes (Signed)
[name] [name]  72 yrs old came in this am from ED  C/o stroke-like symptoms and left leg weakness CT was negative for any new stroke. Assisted to bed plan of care discussed with pt and  Cardiac monitor applied and verified .

## 2018-04-30 NOTE — Plan of Care (Signed)
  Problem: Education: Goal: Knowledge of patient specific risk factors addressed and post discharge goals established will improve Outcome: Progressing   Problem: Education: Goal: Knowledge of secondary prevention will improve Outcome: Progressing   Problem: Education: Goal: Knowledge of disease or condition will improve Outcome: Progressing   Problem: Coping: Goal: Will verbalize positive feelings about self Outcome: Progressing   Problem: Self-Care: Goal: Ability to participate in self-care as condition permits will improve Outcome: Progressing
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
## 2018-04-30 NOTE — Progress Notes (Signed)
Patient voided in toilet, post void bladder scan .  Harue Pribble, Kae Heller, RN

## 2018-04-30 NOTE — Progress Notes (Addendum)
Patient voided  in toilet patient stated he couldn't use urinal.  another nurse helped him and didn't know he needed to be bladder scanned post void.  Megon Kalina, Kae Heller, RN

## 2018-04-30 NOTE — Progress Notes (Signed)
Post void bladder scan showed 473 MD notified and orders obtained for I&O cath patient refused to let me cath him. MD notified and we will continued to monitor urine output and bladder scan as needed.  Tracie Lindbloom, Kae Heller, RN

## 2018-04-30 NOTE — Plan of Care (Signed)
  Problem: Education: Goal: Knowledge of secondary prevention will improve 04/30/2018 0542 by Ashley Royalty, RN Outcome: Progressing 04/30/2018 0433 by Ashley Royalty, RN Outcome: Progressing   Problem: Education: Goal: Knowledge of patient specific risk factors addressed and post discharge goals established will improve 04/30/2018 0543 by Ashley Royalty, RN Outcome: Progressing 04/30/2018 0542 by Ashley Royalty, RN Outcome: Progressing 04/30/2018 0433 by Ashley Royalty, RN Outcome: Progressing   Problem: Education: Goal: Individualized Educational Video(s) 04/30/2018 0543 by Azzie Roup I, RN Outcome: Progressing 04/30/2018 0542 by Azzie Roup I, RN Outcome: Progressing 04/30/2018 0433 by Ashley Royalty, RN Outcome: Progressing

## 2018-04-30 NOTE — Progress Notes (Addendum)
PROGRESS NOTE    [name] [name]  VVO:[id number] DOB: 1945/07/23 DOA: 04/29/2018 PCP: Barbette Reichmann, MD    Brief Narrative:  72 year old male who presented with left lower extremity weakness.  He does have significant past medical history for coronary artery disease status post bypass grafting, recent stroke June 2019 status post TPA with seizure left lower extremity.  Had worsening symptoms of left lower extremity weakness about 7 PM on the day of admission, which was severe in intensity, almost complete paresis.  On his initial physical examination blood pressure 136/76, heart rate 62, respiratory rate 14, oxygen saturation 99%, moist mucous membranes, lungs clear to auscultation bilaterally, heart S1-S2 present rhythmic, abdomen soft nontender, his left lower extremity strength was 1 out of 5, no edema.  137, potassium 4.0, chloride 104, bicarb 26, glucose 162, BUN 19, creatinine 0.78, white cell count 8.8, hemoglobin 14.3, hematocrit 41.6, platelets 230.  Head CT negative for acute changes.  His EKG was sinus rhythm, left axis deviation, normal intervals.  Patient was admitted with working diagnosis of acute focal neurologic deficit, rule out CVA  Assessment & Plan:   Principal Problem:   Acute CVA (cerebrovascular accident) [facility]) Active Problems:   H/O coronary artery bypass surgery   Esophageal lump   PAD (peripheral artery disease) (HCC)   Pressure injury of skin   1. Acute CVA. Patient with persistent left lower extremity weakness, will continue neuro check, physical therapy evaluation, blood pressure control and antiplatelet therapy. Follow up with CT head and neurology recommendations.   2. HTN. Continue blood pressure control with isosorbide.  3, Neuropathy. Continue gabapentin.   4. Urinary retention. Patient declined I&O catheter, continue monitoring bladder scan.   DVT prophylaxis: enoxaparin   Code Status:  full Family Communication: no family at the bedside    Disposition Plan/ discharge barriers: pending neurology recommendations.   Body mass index is 30.18 kg/m. Malnutrition Type:      Malnutrition Characteristics:      Nutrition Interventions:     RN Pressure Injury Documentation: Pressure Injury 04/30/18 Stage II -  Partial thickness loss of dermis presenting as a shallow open ulcer with a red, pink wound bed without slough. (Active)  04/30/18 0000   Location: Knee  Location Orientation: Left  Staging: Stage II -  Partial thickness loss of dermis presenting as a shallow open ulcer with a red, pink wound bed without slough.  Wound Description (Comments):   Present on Admission:      Consultants:   Neurology   Procedures:     Antimicrobials:       Subjective: Patient continue to have left lower extremity weakness, no nausea or vomiting, no chest pain or dyspnea.   Objective: Vitals:   04/30/18 0202 04/30/18 0400 04/30/18 0600 04/30/18 0815  BP: 126/73 (!) 105/54 123/77 126/76  Pulse: 64 80 64 (!) 57  Resp: 16 13 14 20   Temp: (!) 97.1 F (36.2 C) (!) 97.1 F (36.2 C) (!) 97.3 F (36.3 C)   TempSrc: Oral Oral    SpO2: 95% 96% 99% 96%  Weight:      Height:        Intake/Output Summary (Last 24 hours) at 04/30/2018 0851 Last data filed at 04/30/2018 0122 Gross per 24 hour  Intake 1115 ml  Output -  Net 1115 ml   Filed Weights   04/29/18 2100 04/29/18 2142 04/30/18 0100  Weight: 96.6 kg 96.6 kg 92.7 kg    Examination:   General: Not in  pain or dyspnea, deconditioned  Neurology: Awake and alert, left lower extremity 1/5 E ENT: mild pallor, no icterus, oral mucosa moist Cardiovascular: No JVD. S1-S2 present, rhythmic, no gallops, rubs, or murmurs. No lower extremity edema. Pulmonary: vesicular breath sounds bilaterally, adequate air movement, no wheezing, rhonchi or rales. Gastrointestinal. Abdomen with, no organomegaly, non tender, no rebound or guarding Skin. No rashes Musculoskeletal: no  joint deformities     Data Reviewed: I have personally reviewed following labs and imaging studies  CBC: Recent Labs  Lab 04/29/18 2123 04/29/18 2135 04/30/18 0320  WBC 8.8  --  7.4  NEUTROABS 4.0  --   --   HGB 14.3 14.3 13.3  HCT 41.6 42.0 40.2  MCV 91.8  --  92.4  PLT 230  --  235   Basic Metabolic Panel: Recent Labs  Lab 04/29/18 2123 04/29/18 2135 04/30/18 0320  NA 137 138 136  K 4.0 4.0 4.2  CL 104 101 104  CO2 26  --  29  GLUCOSE 162* 158* 160*  BUN 19 21 17   CREATININE 0.78 0.70 0.94  CALCIUM 9.5  --  9.2   GFR: Estimated Creatinine Clearance: 82.2 mL/min (by C-G formula based on SCr of 0.94 mg/dL). Liver Function Tests: Recent Labs  Lab 04/29/18 2123 04/30/18 0320  AST 42* 37  ALT 55* 49*  ALKPHOS 72 66  BILITOT 0.9 0.9  PROT 6.9 6.1*  ALBUMIN 3.8 3.5   No results for input(s): LIPASE, AMYLASE in the last 168 hours. No results for input(s): AMMONIA in the last 168 hours. Coagulation Profile: Recent Labs  Lab 04/29/18 2123  INR 0.96   Cardiac Enzymes: Recent Labs  Lab 04/30/18 0653  TROPONINI <0.03   BNP (last 3 results) No results for input(s): PROBNP in the last 8760 hours. HbA1C: Recent Labs    04/30/18 0320  HGBA1C 6.2*   CBG: No results for input(s): GLUCAP in the last 168 hours. Lipid Profile: Recent Labs    04/30/18 0320  CHOL 156  HDL 38*  LDLCALC 91  TRIG 161  CHOLHDL 4.1   Thyroid Function Tests: No results for input(s): TSH, T4TOTAL, FREET4, T3FREE, THYROIDAB in the last 72 hours. Anemia Panel: No results for input(s): VITAMINB12, FOLATE, FERRITIN, TIBC, IRON, RETICCTPCT in the last 72 hours.    Radiology Studies: I have reviewed all of the imaging during this hospital visit personally     Scheduled Meds: . aspirin  300 mg Rectal Daily   Or  . aspirin  325 mg Oral Daily  . cyclobenzaprine  10 mg Oral QHS  . dicyclomine  10 mg Oral BID  . enoxaparin (LOVENOX) injection  40 mg Subcutaneous Daily  .  gabapentin  300 mg Oral Daily  . gabapentin  600 mg Oral QHS  . isosorbide mononitrate  30 mg Oral Daily  . oxyCODONE  10 mg Oral QHS  . pantoprazole  40 mg Oral Daily   Continuous Infusions: . sodium chloride 75 mL/hr at 04/30/18 0122     LOS: 1 day        Coralie Keens, MD [facility] Pager [phone]

## 2018-04-30 NOTE — Progress Notes (Signed)
Condom cath applied as pt unable to stand and void due to left leg weakness. Pt did not void all night.  Bladder scan done this am and RN noted 558 ml  nn scan pt requested to be assisted to the bath room to seat on the toilet before he would be able to void. Patient assisted to onto the commode  In the bath room. RN endorsed to  On coming RN to do post void scan .

## 2018-05-01 ENCOUNTER — Inpatient Hospital Stay (HOSPITAL_COMMUNITY): Payer: Medicare Other

## 2018-05-01 DIAGNOSIS — G8314 Monoplegia of lower limb affecting left nondominant side: Secondary | ICD-10-CM

## 2018-05-01 MED ORDER — SODIUM CHLORIDE 0.9 % IV SOLN
INTRAVENOUS | Status: AC
  Administered 2018-05-01 (×2): via INTRAVENOUS

## 2018-05-01 NOTE — Care Management Note (Addendum)
Case Management Note  Patient Details  Name: BRYNN OSWALD MRN: [id number] Date of Birth: Jun 15, 1945  Subjective/Objective:      Pt admitted to r/o CVA. Pt having increased difficulty d/t his back.  From home with his spouse and has been working until hospitalization.            Contact person: wife: Okey Dupre [phone]  Action/Plan: Recommendations currently are for [facility] services. CM provided him choice and he selected [facility].  Awaiting CTA of his back.  MD if patient d/c over weekend he will need HH orders and F2F.  CM following.   Expected Discharge Date:  05/02/18               Expected Discharge Plan:     In-House Referral:     Discharge planning Services     Post Acute Care Choice:    Choice offered to:     DME Arranged:    DME Agency:     HH Arranged:    HH Agency:     Status of Service:     If discussed at [employer], dates discussed:    Additional Comments:  Kermit Balo, RN 05/01/2018, 2:46 PM

## 2018-05-01 NOTE — Progress Notes (Signed)
PROGRESS NOTE    [name] [name]  XNA:[id number] DOB: Aug 25, 1945 DOA: 04/29/2018 PCP: Barbette Reichmann, MD    Brief Narrative:  72 year old male who presented with left lower extremity weakness.  He does have significant past medical history for coronary artery disease status post bypass grafting, recent stroke June 2019 status post TPA with seizure left lower extremity.  Had worsening symptoms of left lower extremity weakness about 7 PM on the day of admission, which was severe in intensity, almost complete paresis.  On his initial physical examination blood pressure 136/76, heart rate 62, respiratory rate 14, oxygen saturation 99%, moist mucous membranes, lungs clear to auscultation bilaterally, heart S1-S2 present rhythmic, abdomen soft nontender, his left lower extremity strength was 1 out of 5, no edema.  137, potassium 4.0, chloride 104, bicarb 26, glucose 162, BUN 19, creatinine 0.78, white cell count 8.8, hemoglobin 14.3, hematocrit 41.6, platelets 230.  Head CT negative for acute changes.  His EKG was sinus rhythm, left axis deviation, normal intervals.  Patient was admitted with working diagnosis of acute focal neurologic deficit, rule out CVA   Assessment & Plan:   Principal Problem:   Acute CVA (cerebrovascular accident) [facility]) Active Problems:   H/O coronary artery bypass surgery   Esophageal lump   PAD (peripheral artery disease) (HCC)   Pressure injury of skin   1. Left lower extremity paresis. Continue with left lower extremity weakness and pain. Follow head CT with no cva, not able to get brain MRI with to metallic object. Will follow on spine contrasted study, per neurology recommendations. Continue pain control with analgesics (oxycodone) and muscle relaxants. For now will continue aspirin.   2. HTN.  Isosorbide for blood pressure control.  3, Neuropathy. On gabapentin.   4. Urinary retention. Patient declined catheterization, continue bladder scanning. This am  with no abdominal pain.   DVT prophylaxis: enoxaparin   Code Status:  full Family Communication: no family at the bedside  Disposition Plan/ discharge barriers: pending neurology recommendations.   Body mass index is 30.18 kg/m. Malnutrition Type:      Malnutrition Characteristics:      Nutrition Interventions:     RN Pressure Injury Documentation: Pressure Injury 04/30/18 Stage II -  Partial thickness loss of dermis presenting as a shallow open ulcer with a red, pink wound bed without slough. (Active)  04/30/18 0000   Location: Knee  Location Orientation: Left  Staging: Stage II -  Partial thickness loss of dermis presenting as a shallow open ulcer with a red, pink wound bed without slough.  Wound Description (Comments):   Present on Admission:      Consultants:   Neurology  Procedures:     Antimicrobials:       Subjective: Patient continue to have left lower extremity weakness, moderate in intensity, no improving or worsening factors, associated pain at the lower back. Pain improved with analgesics and muscle relaxants.   Objective: Vitals:   05/01/18 0012 05/01/18 0048 05/01/18 0414 05/01/18 0757  BP: (!) 88/38 (!) 103/42 (!) 121/59 135/65  Pulse: (!) 52 (!) 44 (!) 53   Resp: 16 16 13 16   Temp:   (!) 97.2 F (36.2 C)   TempSrc:   Oral   SpO2: 97% 94% 99%   Weight:      Height:        Intake/Output Summary (Last 24 hours) at 05/01/2018 0815 Last data filed at 05/01/2018 0600 Gross per 24 hour  Intake 1170 ml  Output 0 ml  Net 1170 ml   Filed Weights   04/29/18 2100 04/29/18 2142 04/30/18 0100  Weight: 96.6 kg 96.6 kg 92.7 kg    Examination:   General: deconditioned  Neurology: Awake and alert, non focal/ left lower extremity 1/5 right 5/5   E ENT: no pallor, no icterus, oral mucosa moist Cardiovascular: No JVD. S1-S2 present, rhythmic, no gallops, rubs, or murmurs. No lower extremity edema. Pulmonary: vesicular breath sounds  bilaterally, adequate air movement, no wheezing, rhonchi or rales. Gastrointestinal. Abdomen with no organomegaly, non tender, no rebound or guarding Skin. No rashes Musculoskeletal: no joint deformities     Data Reviewed: I have personally reviewed following labs and imaging studies  CBC: Recent Labs  Lab 04/29/18 2123 04/29/18 2135 04/30/18 0320  WBC 8.8  --  7.4  NEUTROABS 4.0  --   --   HGB 14.3 14.3 13.3  HCT 41.6 42.0 40.2  MCV 91.8  --  92.4  PLT 230  --  235   Basic Metabolic Panel: Recent Labs  Lab 04/29/18 2123 04/29/18 2135 04/30/18 0320  NA 137 138 136  K 4.0 4.0 4.2  CL 104 101 104  CO2 26  --  29  GLUCOSE 162* 158* 160*  BUN 19 21 17   CREATININE 0.78 0.70 0.94  CALCIUM 9.5  --  9.2   GFR: Estimated Creatinine Clearance: 82.2 mL/min (by C-G formula based on SCr of 0.94 mg/dL). Liver Function Tests: Recent Labs  Lab 04/29/18 2123 04/30/18 0320  AST 42* 37  ALT 55* 49*  ALKPHOS 72 66  BILITOT 0.9 0.9  PROT 6.9 6.1*  ALBUMIN 3.8 3.5   No results for input(s): LIPASE, AMYLASE in the last 168 hours. No results for input(s): AMMONIA in the last 168 hours. Coagulation Profile: Recent Labs  Lab 04/29/18 2123  INR 0.96   Cardiac Enzymes: Recent Labs  Lab 04/30/18 0653  TROPONINI <0.03   BNP (last 3 results) No results for input(s): PROBNP in the last 8760 hours. HbA1C: Recent Labs    04/30/18 0320  HGBA1C 6.2*   CBG: No results for input(s): GLUCAP in the last 168 hours. Lipid Profile: Recent Labs    04/30/18 0320  CHOL 156  HDL 38*  LDLCALC 91  TRIG 161  CHOLHDL 4.1   Thyroid Function Tests: No results for input(s): TSH, T4TOTAL, FREET4, T3FREE, THYROIDAB in the last 72 hours. Anemia Panel: No results for input(s): VITAMINB12, FOLATE, FERRITIN, TIBC, IRON, RETICCTPCT in the last 72 hours.    Radiology Studies: I have reviewed all of the imaging during this hospital visit personally     Scheduled Meds: . aspirin  EC  81 mg Oral Daily  . dicyclomine  10 mg Oral BID  . enoxaparin (LOVENOX) injection  40 mg Subcutaneous Daily  . gabapentin  300 mg Oral Daily  . gabapentin  600 mg Oral QHS  . isosorbide mononitrate  30 mg Oral Daily  . pantoprazole  40 mg Oral Daily   Continuous Infusions: . sodium chloride 75 mL/hr at 05/01/18 0322     LOS: 2 days        Coralie Keens, MD [facility] Pager [phone]
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
## 2018-05-01 NOTE — Progress Notes (Signed)
Mr Hardiman  Has been having low BP IV restarted and pt got so irritable that why is this nurse restarting IV as he was off it all day  This RN explained to pt and family that iv is supposed to infused  until 2300 this night.  Pt later complied    NM bp remained low at 92/44 hr 58  BP rechecked and remained low at 88/38 pt asymptomatic. IV fluid continued and MD on call made aware. Will continue to monitor pt closely

## 2018-05-01 NOTE — Progress Notes (Signed)
Physical Therapy Treatment Patient Details Name: [name] [name] MRN: [id number] DOB: Jun 16, 1945 Today's Date: 05/01/2018    History of Present Illness 72 y.o. male admitted on 04/30/18 for L leg weakness.  Suspected stroke.  CT was negative, unable to preform MRI due to metal in his body.  Pt refused tPA.  Pt with significant PMH of HOH, stroke, CAD, CHF, back pain, L RTC repair, neck surgery, back surgery, ankle reconstruction.      PT Comments    Patient's tolerance to treatment today was fair.  Patient was in bed with no visitors present upon PT arrival.  Pt continues to be limited by pain in L leg.  Treatment was limited to room and hallway ambulation with RW.  Pt continues to require B UE support from RW in order to decrease weight on L leg during ambulation.  Therapist educated pt on importance of equal weight bearing on B LE during ambulation.  I have discussed the patient's current level of function related to strength and mobility with the patient.  He acknowledges understanding of this and feels he can provide the level of care he will need at home.  Pt would continue to benefit from acute care PT to progress towards all stated goals and improve functional independence.        Follow Up Recommendations  Home health PT;Supervision for mobility/OOB     Equipment Recommendations  None recommended by PT    Recommendations for Other Services       Precautions / Restrictions Precautions Precautions: Fall Precaution Comments: left leg pain/weakness Restrictions Weight Bearing Restrictions: No    Mobility  Bed Mobility Overal bed mobility: Needs Assistance Bed Mobility: Supine to Sit Rolling: Supervision Sidelying to sit: Min assist Supine to sit: Supervision;HOB elevated   Sit to sidelying: Min assist General bed mobility comments: Pt was able to move B LE to EOB and use bed railing to boost into sitting requiring increased time and supervision for safety  awareness.  Transfers Overall transfer level: Needs assistance Equipment used: Rolling walker (2 wheeled) Transfers: Sit to/from Stand Sit to Stand: Min assist         General transfer comment: Pt was unsuccessful with first trial of sit to stand and required education for proper hand placement.  Patient was able to boost into standing on 2nd trial.  VC provided to reach back to bed surface prior to sitting.  Ambulation/Gait Ambulation/Gait assistance: Min assist Gait Distance (Feet): 20 Feet Assistive device: Rolling walker (2 wheeled) Gait Pattern/deviations: Step-to pattern;Step-through pattern;Decreased step length - right;Decreased step length - left;Decreased stride length;Antalgic Gait velocity: decreased   General Gait Details: Pt initially demonstrated antalgic gait with step to pattern.  Pt's ambulation was significantly decreased due to pain in L leg.  Pt continues to be reliant on RW for UE support to decrease weight on L leg.  Pt demonstrated improved gait with step through pattern towards end of trial.  Pt required 1-2 standing rest breaks.  VC to look forward provided.   Stairs             Wheelchair Mobility    Modified Rankin (Stroke Patients Only) Modified Rankin (Stroke Patients Only) Pre-Morbid Rankin Score: No symptoms Modified Rankin: Moderately severe disability     Balance Overall balance assessment: Needs assistance Sitting-balance support: Feet supported;No upper extremity supported Sitting balance-Leahy Scale: Good     Standing balance support: Bilateral upper extremity supported;During functional activity Standing balance-Leahy Scale: Poor Standing balance comment: needs  external support in standing.                             Cognition Arousal/Alertness: Awake/alert Behavior During Therapy: WFL for tasks assessed/performed Overall Cognitive Status: Within Functional Limits for tasks assessed                                  General Comments: Pt has decreased awareness of safety and deficits.  He is slightly impulsive.      Exercises      General Comments        Pertinent Vitals/Pain Pain Assessment: 0-10 Pain Score: 6  Faces Pain Scale: Hurts even more Pain Location: L leg and hip Pain Descriptors / Indicators: Discomfort;Grimacing Pain Intervention(s): Limited activity within patient's tolerance;Monitored during session    Home Living Family/patient expects to be discharged to:: Private residence Living Arrangements: Spouse/significant other Available Help at Discharge: Family;Available PRN/intermittently Type of Home: House Home Access: Stairs to enter   Home Layout: One level Home Equipment: Environmental consultant - 2 wheels;Walker - 4 wheels;Cane - single point Additional Comments: DME belongs to wife    Prior Function Level of Independence: Independent      Comments: works in Production designer, theatre/television/film for storage units   PT Goals (current goals can now be found in the care plan section) Acute Rehab PT Goals Patient Stated Goal: none stated PT Goal Formulation: With patient Time For Goal Achievement: 05/14/18 Potential to Achieve Goals: Good Progress towards PT goals: Progressing toward goals    Frequency    Min 4X/week      PT Plan      Co-evaluation              AM-PAC PT "6 Clicks" Mobility   Outcome Measure  Help needed turning from your back to your side while in a flat bed without using bedrails?: None Help needed moving from lying on your back to sitting on the side of a flat bed without using bedrails?: None Help needed moving to and from a bed to a chair (including a wheelchair)?: None Help needed standing up from a chair using your arms (e.g., wheelchair or bedside chair)?: None Help needed to walk in hospital room?: A Little Help needed climbing 3-5 steps with a railing? : A Little 6 Click Score: 22    End of Session Equipment Utilized During Treatment: Gait  belt Activity Tolerance: Patient limited by pain Patient left: in bed;with call bell/phone within reach;with bed alarm set Nurse Communication: Mobility status PT Visit Diagnosis: Muscle weakness (generalized) (M62.81);Difficulty in walking, not elsewhere classified (R26.2);Pain Pain - Right/Left: Left Pain - part of body: Leg     Time: 1450-1513 PT Time Calculation (min) (ACUTE ONLY): 23 min  Charges:  $Gait Training: 8-22 mins $Therapeutic Activity: 8-22 mins                     [address] SPTA    Atha Mcbain 05/01/2018, 5:05 PM
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
## 2018-05-02 ENCOUNTER — Encounter (HOSPITAL_COMMUNITY): Payer: Self-pay | Admitting: Radiology

## 2018-05-02 ENCOUNTER — Inpatient Hospital Stay (HOSPITAL_COMMUNITY): Payer: Medicare Other

## 2018-05-02 MED ORDER — IOHEXOL 300 MG/ML  SOLN
75.0000 mL | Freq: Once | INTRAMUSCULAR | Status: AC | PRN
  Administered 2018-05-02: 75 mL via INTRAVENOUS
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
## 2018-05-03 NOTE — Evaluation (Signed)
Speech Language Pathology Evaluation Patient Details Name: [name] [name] MRN: [id number] DOB: [birth year] Today's Date: 05/03/2018 Time:  -     Problem List:  Patient Active Problem List   Diagnosis Date Noted  . Paresis of left lower extremity (HCC)   . Pressure injury of skin 04/30/2018  . Acute CVA (cerebrovascular accident) (HCC) 04/29/2018  . Obesity (BMI 30.0-34.9) 11/11/2017  . Stroke-like episode (HCC) - R brain, s/p tPA 11/09/2017  . PAD (peripheral artery disease) (HCC) 06/26/2016  . Pain in limb 06/04/2016  . Annual physical exam 05/31/2016  . Cerumen impaction 05/31/2016  . ERRONEOUS ENCOUNTER--DISREGARD 01/29/2016  . Difficulty in swallowing   . Hiatal hernia   . Gastritis   . Heartburn 10/11/2015  . Influenza-like illness 08/18/2015  . Sinusitis, acute 07/20/2015  . Elevation of level of transaminase and lactic acid dehydrogenase (LDH) 05/11/2015  . Back pain with radiation 05/11/2015  . Cardiomyopathy (HCC) 05/11/2015  . Esophageal lesion 05/11/2015  . Pain in the wrist 05/11/2015  . Low back pain with sciatica 05/11/2015  . Heart attack (HCC) 05/11/2015  . Absent peripheral pulse 05/11/2015  . ERRONEOUS ENCOUNTER--DISREGARD 04/26/2015  . Back pain, chronic 02/03/2015  . Leg swelling 02/03/2015  . Edema leg 02/03/2015  . CAD in native artery 02/03/2015  . Narrowing of intervertebral disc space 02/03/2015  . Acid reflux 02/03/2015  . Hypercholesteremia 02/03/2015  . BP (high blood pressure) 02/03/2015  . Cardiomyopathy, ischemic 02/03/2015  . Arthritis of hand, degenerative 02/03/2015  . Insomnia secondary to chronic pain 11/14/2014  . 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 Diuretic-induced hypokalemia 11/14/2014  . History of decompression of median nerve 10/03/2014  . Acquired polyneuropathy 09/28/2014  . Carpal tunnel syndrome 09/28/2014  . Peripheral neuropathy 09/28/2014  . Chest pain 10/20/2013  . Other specified cardiac arrhythmias  08/23/2013  . H/O coronary artery bypass surgery 08/21/2013  . Chronic low back pain with right-sided sciatica 08/19/2013  . Difficulty hearing 08/19/2013  . Arteriosclerosis of coronary artery 08/19/2013  . Esophageal lump 08/19/2013   Past Medical History:  Past Medical History:  Diagnosis Date  . Anginal pain (HCC)   . Arthritis   . Back pain    degenerative disc disease  . Carpal tunnel syndrome of right wrist   . CHF (congestive heart failure) (HCC)   . Coronary artery disease   . Diverticulosis   . GERD (gastroesophageal reflux disease)   . Myocardial infarction (HCC)   . Neuropathy   . Stroke (HCC)   . Wears dentures    full upper and lower  . Wears hearing aid    bilateral   Past Surgical History:  Past Surgical History:  Procedure Laterality Date  . ANKLE RECONSTRUCTION Left 1991  . APPENDECTOMY    . BACK SURGERY N/A    lower back x 3  . CARDIAC CATHETERIZATION    . CARPAL TUNNEL RELEASE Left 03/2014   In Dr. Rosita Kea office  . CARPAL TUNNEL RELEASE Left 10/20/2014   Procedure: CARPAL TUNNEL RELEASE;  Surgeon: Kennedy Bucker, MD;  Location: [facility];  Service: Orthopedics;  Laterality: Left;  . CORONARY ARTERY BYPASS GRAFT  March 2014   [facility]  . ESOPHAGOGASTRODUODENOSCOPY (EGD) WITH PROPOFOL N/A 10/26/2015   Procedure: ESOPHAGOGASTRODUODENOSCOPY (EGD) WITH PROPOFOL with dialation;  Surgeon: [name], MD;  Location: [facility];  Service: Endoscopy;  Laterality: N/A;  . EYE SURGERY Bilateral    Cataract Extraction with IOL  . NECK SURGERY N/A  neck x 2, one discectomy and one shaved disc.  Marland Kitchen SHOULDER ARTHROSCOPY Right 06/06/2015   Procedure: right shoulder arthroscopy, arthroscopic debridement, subacromial decompression, SLAP repair, biotenodesis, mini open rotator cuff repair;  Surgeon: Christena Flake, MD;  Location: [facility];  Service: Orthopedics;  Laterality: Right;  . SHOULDER OPEN ROTATOR CUFF REPAIR     Left   HPI:  72 y.o. male  admitted on 04/30/18 for L leg weakness.  Suspected stroke.  CT was negative, unable to preform MRI due to metal in his body.  Pt refused tPA.  Pt with significant PMH of HOH, stroke, CAD, CHF, back pain, L RTC repair, neck surgery, back surgery, ankle reconstruction.  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 Pt known to SLP service from June 2019 admission, at which time cognitive/language function was WNL.    Assessment / Plan / Recommendation Clinical Impression  Pt presents with baseline communication/cognitive function; he is a good historian, speech fluent and clear.  Cognition and expressive/receptive language WNL.  Hoarse voice.  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  No SLP needs identified - our service will sign off.     SLP Assessment  SLP Recommendation/Assessment: Patient does not need any further Speech Lanaguage Pathology Services SLP Visit Diagnosis: Cognitive communication deficit (R41.841)    Follow Up Recommendations  None    Frequency and Duration           SLP Evaluation Cognition  Overall Cognitive Status: Within Functional Limits for tasks assessed Arousal/Alertness: Awake/alert Orientation Level: Oriented X4 Attention: Alternating Alternating Attention: Appears intact Memory: Appears intact Awareness: Appears intact       Comprehension  Auditory Comprehension Overall Auditory Comprehension: Appears within functional limits for tasks assessed Visual Recognition/Discrimination Discrimination: Within Function Limits Reading Comprehension Reading Status: Not tested    Expression Expression Primary Mode of Expression: Verbal Verbal Expression Overall Verbal Expression: Appears within functional limits for tasks assessed Written Expression Dominant Hand: Right   Oral / Motor  Oral Motor/Sensory Function Overall Oral Motor/Sensory Function: Within  functional limits Motor Speech Overall Motor Speech: Appears within functional limits for tasks assessed   GO                    Blenda Mounts Laurice 05/03/2018, 11:56 AM

## 2018-05-03 NOTE — Progress Notes (Signed)
Patient ID: [name] [name], male   DOB: 02-17-1946, 72 y.o.   MRN: [id number] Signs are stable and his left lower extremity weakness is unchanged Plan is for myelogram tomorrow

## 2018-05-04 ENCOUNTER — Inpatient Hospital Stay (HOSPITAL_COMMUNITY): Payer: Medicare Other

## 2018-05-04 ENCOUNTER — Other Ambulatory Visit: Payer: Self-pay | Admitting: Neurological Surgery

## 2018-05-04 LAB — BASIC METABOLIC PANEL
Anion gap: 7 (ref 5–15)
BUN: 17 mg/dL (ref 8–23)
CALCIUM: 9.6 mg/dL (ref 8.9–10.3)
CO2: 32 mmol/L (ref 22–32)
CREATININE: 1.03 mg/dL (ref 0.61–1.24)
Chloride: 96 mmol/L — ABNORMAL LOW (ref 98–111)
Glucose, Bld: 135 mg/dL — ABNORMAL HIGH (ref 70–99)
Potassium: 4.4 mmol/L (ref 3.5–5.1)
SODIUM: 135 mmol/L (ref 135–145)

## 2018-05-04 LAB — PROTIME-INR
INR: 0.94
Prothrombin Time: 12.4 seconds (ref 11.4–15.2)

## 2018-05-04 LAB — CBC
HCT: 42.2 % (ref 39.0–52.0)
Hemoglobin: 14.5 g/dL (ref 13.0–17.0)
MCH: 31.1 pg (ref 26.0–34.0)
MCHC: 34.4 g/dL (ref 30.0–36.0)
MCV: 90.6 fL (ref 80.0–100.0)
Platelets: 219 10*3/uL (ref 150–400)
RBC: 4.66 MIL/uL (ref 4.22–5.81)
RDW: 12.3 % (ref 11.5–15.5)
WBC: 7.6 10*3/uL (ref 4.0–10.5)
nRBC: 0 % (ref 0.0–0.2)

## 2018-05-04 LAB — MAGNESIUM: MAGNESIUM: 2.1 mg/dL (ref 1.7–2.4)

## 2018-05-04 MED ORDER — MORPHINE SULFATE (PF) 2 MG/ML IV SOLN
2.0000 mg | Freq: Once | INTRAVENOUS | Status: AC
  Administered 2018-05-04: 2 mg via INTRAVENOUS
  Filled 2018-05-04: qty 1

## 2018-05-04 MED ORDER — LIDOCAINE HCL (PF) 1 % IJ SOLN
5.0000 mL | Freq: Once | INTRAMUSCULAR | Status: AC
  Administered 2018-05-04: 5 mL via INTRADERMAL

## 2018-05-04 MED ORDER — ONDANSETRON HCL 4 MG/2ML IJ SOLN
4.0000 mg | Freq: Four times a day (QID) | INTRAMUSCULAR | Status: DC | PRN
  Administered 2018-05-06: 4 mg via INTRAVENOUS

## 2018-05-04 MED ORDER — IOPAMIDOL (ISOVUE-M 200) INJECTION 41%
20.0000 mL | Freq: Once | INTRAMUSCULAR | Status: AC
  Administered 2018-05-04: 16 mL via EPIDURAL

## 2018-05-04 NOTE — Plan of Care (Signed)
  Problem: Education: Goal: Knowledge of disease or condition will improve Outcome: Progressing Goal: Knowledge of secondary prevention will improve Outcome: Progressing Goal: Knowledge of patient specific risk factors addressed and post discharge goals established will improve Outcome: Progressing Goal: Individualized Educational Video(s) Outcome: Progressing   Problem: Coping: Goal: Will verbalize positive feelings about self Outcome: Progressing   Problem: Health Behavior/Discharge Planning: Goal: Ability to manage health-related needs will improve Outcome: Progressing   Problem: Self-Care: Goal: Ability to participate in self-care as condition permits will improve Outcome: Progressing Goal: Verbalization of feelings and concerns over difficulty with self-care will improve Outcome: Progressing Goal: Ability to communicate needs accurately will improve Outcome: Progressing   Problem: Nutrition: Goal: Risk of aspiration will decrease Outcome: Progressing Goal: Dietary intake will improve Outcome: Progressing   Problem: Ischemic Stroke/TIA Tissue Perfusion: Goal: Complications of ischemic stroke/TIA will be minimized Outcome: Progressing   Problem: Education: Goal: Knowledge of General Education information will improve Description Including pain rating scale, medication(s)/side effects and non-pharmacologic comfort measures Outcome: Progressing   Problem: Health Behavior/Discharge Planning: Goal: Ability to manage health-related needs will improve Outcome: Progressing   Problem: Clinical Measurements: Goal: Ability to maintain clinical measurements within normal limits will improve Outcome: Progressing Goal: Will remain free from infection Outcome: Progressing Goal: Diagnostic test results will improve Outcome: Progressing Goal: Respiratory complications will improve Outcome: Progressing   Problem: Activity: Goal: Risk for activity intolerance will  decrease Outcome: Progressing   Problem: Nutrition: Goal: Adequate nutrition will be maintained Outcome: Progressing   Problem: Coping: Goal: Level of anxiety will decrease Outcome: Progressing   Problem: Elimination: Goal: Will not experience complications related to bowel motility Outcome: Progressing Goal: Will not experience complications related to urinary retention Outcome: Progressing   Problem: Pain Managment: Goal: General experience of comfort will improve Outcome: Progressing   Problem: Safety: Goal: Ability to remain free from injury will improve Outcome: Progressing   Problem: Skin Integrity: Goal: Risk for impaired skin integrity will decrease Outcome: Progressing

## 2018-05-04 NOTE — Progress Notes (Signed)
Physical Therapy Treatment Patient Details Name: [name] [name] MRN: [id number] DOB: September 10, 1945 Today's Date: 05/04/2018    History of Present Illness 72 y.o. male admitted on 04/30/18 for L leg weakness.  Suspected stroke.  CT was negative, unable to preform MRI due to metal in his body.  Pt refused tPA.  Pt with significant PMH of HOH, stroke, CAD, CHF, back pain, L RTC repair, neck surgery, back surgery, ankle reconstruction.  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    PT Comments    Pt performed gait training and functional mobility during session.  He is eager for surgery to alleviate L LE pain.  Pt remains to require min assistance for gait and continues to be appropriate for return home.  Will continue to assess post surgery for d/c planning.      Follow Up Recommendations  Home health PT;Supervision for mobility/OOB     Equipment Recommendations  None recommended by PT    Recommendations for Other Services       Precautions / Restrictions Precautions Precautions: Fall Precaution Comments: left leg pain/weakness Restrictions Weight Bearing Restrictions: No    Mobility  Bed Mobility Overal bed mobility: Needs Assistance Bed Mobility: Sit to Supine       Sit to supine: Min assist   General bed mobility comments: Pt required min assistance to elevate B limbs back into bed,  cues to maintain log rolling.    Transfers Overall transfer level: Needs assistance Equipment used: Rolling walker (2 wheeled) Transfers: Sit to/from Stand Sit to Stand: Min guard         General transfer comment: Pt required cues for hand placement to and from seated surface.  Pt also required cues to use grab bar in bathroom.   Ambulation/Gait Ambulation/Gait assistance: Min assist Gait Distance (Feet): 100 Feet Assistive device: Rolling walker (2 wheeled) Gait Pattern/deviations: Step-to  pattern;Step-through pattern;Decreased step length - right;Decreased step length - left;Decreased stride length;Antalgic Gait velocity: decreased   General Gait Details: Pt initially demonstrated antalgic gait with step to pattern.  Pt's ambulation was significantly decreased due to pain in L leg.  Pt continues to be reliant on RW for UE support to decrease weight on L leg.  Pt demonstrated improved gait with step through pattern towards end of trial.  Pt required 1-2 standing rest breaks.  VC to look forward provided.   Stairs             Wheelchair Mobility    Modified Rankin (Stroke Patients Only) Modified Rankin (Stroke Patients Only) Pre-Morbid Rankin Score: No symptoms Modified Rankin: Moderately severe disability     Balance Overall balance assessment: Needs assistance   Sitting balance-Leahy Scale: Good     Standing balance support: Bilateral upper extremity supported;During functional activity Standing balance-Leahy Scale: Poor Standing balance comment: needs external support in standing.                             Cognition Arousal/Alertness: Awake/alert Behavior During Therapy: WFL for tasks assessed/performed Overall Cognitive Status: Within Functional Limits for tasks assessed                                 General Comments: Pt has decreased awareness of safety and deficits.  He is slightly impulsive.      Exercises  General Comments        Pertinent Vitals/Pain Pain Assessment: 0-10 Pain Score: 6  Pain Location: L leg and hip Pain Descriptors / Indicators: Discomfort;Grimacing;Spasm Pain Intervention(s): Monitored during session;Patient requesting pain meds-RN notified;Repositioned    Home Living                      Prior Function            PT Goals (current goals can now be found in the care plan section) Acute Rehab PT Goals Patient Stated Goal: to have less pain Potential to Achieve Goals:  Good Progress towards PT goals: Progressing toward goals    Frequency    Min 4X/week      PT Plan Current plan remains appropriate    Co-evaluation              AM-PAC PT "6 Clicks" Mobility   Outcome Measure  Help needed turning from your back to your side while in a flat bed without using bedrails?: None Help needed moving from lying on your back to sitting on the side of a flat bed without using bedrails?: None Help needed moving to and from a bed to a chair (including a wheelchair)?: None Help needed standing up from a chair using your arms (e.g., wheelchair or bedside chair)?: None Help needed to walk in hospital room?: A Little Help needed climbing 3-5 steps with a railing? : A Little 6 Click Score: 22    End of Session Equipment Utilized During Treatment: Gait belt Activity Tolerance: Patient limited by pain Patient left: in bed;with call bell/phone within reach;with bed alarm set Nurse Communication: Mobility status PT Visit Diagnosis: Muscle weakness (generalized) (M62.81);Difficulty in walking, not elsewhere classified (R26.2);Pain Pain - Right/Left: Left Pain - part of body: Leg     Time: 2202-5427 PT Time Calculation (min) (ACUTE ONLY): 26 min  Charges:  $Gait Training: 8-22 mins $Therapeutic Activity: 8-22 mins                     Joycelyn Rua, PTA Acute Rehabilitation Services Pager [phone] Office [phone]     [name] [name] 05/04/2018, 5:48 PM
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
## 2018-05-04 NOTE — Progress Notes (Signed)
Patient back to unit at this time from procedure.  No s/s of distress noted.  Denies pain.  Patient to be flat until 1320 per orders, patient aware.

## 2018-05-04 NOTE — Progress Notes (Signed)
Patient ID: [name] [name], male   DOB: 11-09-1945, 72 y.o.   MRN: [id number] Patient had myelogram today He has a high-grade stenosis at L4-L5 which could explain the leg weakness This presents is essentially a complete myelographic block I discussed the need for surgery to decompress L4-L5 with wide laminotomies and foraminotomies He is eager to proceed but I noted that we will schedule him at his earliest possible convenience which may not be until Wednesday afternoon

## 2018-05-04 NOTE — Care Management Important Message (Signed)
Important Message  Patient Details  Name: [name] [name] MRN: [id number] Date of Birth: 08/16/1945   Medicare Important Message Given:  Yes    [place] Verne Stefan Church 05/04/2018, 4:56 PM

## 2018-05-04 NOTE — Procedures (Signed)
Procedure: Lumbar Myelogram via LP with fluoro guidance at L2/3. Specimen: none Bleeding: minimal. Complications: None immediate. Patient   -Condition: Stable.  -Disposition:  Return to inpt floor following imaging.  Full Radiology Report to follow under IMAGING

## 2018-05-05 DIAGNOSIS — K228 Other specified diseases of esophagus: Secondary | ICD-10-CM

## 2018-05-05 LAB — CBC
HCT: 41 % (ref 39.0–52.0)
HEMOGLOBIN: 14.3 g/dL (ref 13.0–17.0)
MCH: 31.8 pg (ref 26.0–34.0)
MCHC: 34.9 g/dL (ref 30.0–36.0)
MCV: 91.1 fL (ref 80.0–100.0)
Platelets: 208 10*3/uL (ref 150–400)
RBC: 4.5 MIL/uL (ref 4.22–5.81)
RDW: 12.3 % (ref 11.5–15.5)
WBC: 7.6 10*3/uL (ref 4.0–10.5)
nRBC: 0 % (ref 0.0–0.2)

## 2018-05-05 LAB — BASIC METABOLIC PANEL
Anion gap: 9 (ref 5–15)
BUN: 21 mg/dL (ref 8–23)
CO2: 29 mmol/L (ref 22–32)
Calcium: 9.3 mg/dL (ref 8.9–10.3)
Chloride: 98 mmol/L (ref 98–111)
Creatinine, Ser: 1.24 mg/dL (ref 0.61–1.24)
GFR calc non Af Amer: 59 mL/min — ABNORMAL LOW (ref >60)
Glucose, Bld: 124 mg/dL — ABNORMAL HIGH (ref 70–99)
POTASSIUM: 4.3 mmol/L (ref 3.5–5.1)
Sodium: 136 mmol/L (ref 135–145)

## 2018-05-05 MED ORDER — DOCUSATE SODIUM 100 MG PO CAPS
100.0000 mg | ORAL_CAPSULE | Freq: Two times a day (BID) | ORAL | Status: DC
  Administered 2018-05-05 (×2): 100 mg via ORAL
  Filled 2018-05-05 (×2): qty 1

## 2018-05-05 MED ORDER — POLYETHYLENE GLYCOL 3350 17 G PO PACK
17.0000 g | PACK | Freq: Once | ORAL | Status: AC
  Administered 2018-05-05: 17 g via ORAL
  Filled 2018-05-05: qty 1

## 2018-05-05 MED ORDER — CHLORHEXIDINE GLUCONATE CLOTH 2 % EX PADS
6.0000 | MEDICATED_PAD | Freq: Once | CUTANEOUS | Status: AC
  Administered 2018-05-05: 6 via TOPICAL

## 2018-05-05 MED ORDER — CHLORHEXIDINE GLUCONATE CLOTH 2 % EX PADS
6.0000 | MEDICATED_PAD | Freq: Once | CUTANEOUS | Status: AC
  Administered 2018-05-06: 6 via TOPICAL
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
## 2018-05-05 NOTE — Plan of Care (Signed)
  Problem: Education: Goal: Individualized Educational Video(s) Outcome: Progressing   Problem: Education: Goal: Knowledge of patient specific risk factors addressed and post discharge goals established will improve Outcome: Progressing   Problem: Health Behavior/Discharge Planning: Goal: Ability to manage health-related needs will improve Outcome: Progressing

## 2018-05-05 NOTE — Progress Notes (Signed)
Mr [name] [name] remained at risk for fall, noncompliance with safety measure will not use the call bell for assistance even though within pt reach. CB use re enforce multiple times chair alarm and bed alarm in use.Marland Kitchen

## 2018-05-05 NOTE — Progress Notes (Signed)
Physical Therapy Treatment Patient Details Name: [name] [name] MRN: [id number] DOB: 1946-02-27 Today's Date: 05/05/2018    History of Present Illness 72 y.o. male admitted on 04/30/18 for L leg weakness.  Suspected stroke.  CT was negative, unable to preform MRI due to metal in his body.  Pt refused tPA.  Pt with significant PMH of HOH, stroke, CAD, CHF, back pain, L RTC repair, neck surgery, back surgery, ankle reconstruction.  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    PT Comments    Patient's tolerance to treatment today was fair.  Patient was in bed with no visitors present upon PT arrival.  Patient continues to be limited by pain in his L LE.  Patient continues to ambulate at a decreased speed requiring frequent rest breaks.  Patient was unable to participate with LE exercises on L side.  I have discussed the patient's current level of function related to mobility with the patient.  He acknowledges understanding of this and feels he can provide the level of care he will need at home.  Pt continues to be a good candidate for HHPT at this time based on current functional status.    Follow Up Recommendations  Home health PT;Supervision for mobility/OOB     Equipment Recommendations  None recommended by PT    Recommendations for Other Services       Precautions / Restrictions Precautions Precautions: Fall Precaution Comments: left leg pain/weakness Restrictions Weight Bearing Restrictions: No    Mobility  Bed Mobility Overal bed mobility: Needs Assistance Bed Mobility: Rolling;Sidelying to Sit Rolling: Supervision;Min guard Sidelying to sit: Min assist       General bed mobility comments: Pt required VC to bend knees prior to log rolling with use of bed railing.  Patient was only able to bend his R knee prior to rolling.  Pt required min A to move B LE over EOB.  Pt required increased time to  boost trunk into sitting with flat bed surface.  Transfers Overall transfer level: Needs assistance Equipment used: Rolling walker (2 wheeled) Transfers: Sit to/from Stand Sit to Stand: Min guard         General transfer comment: Pt continued to require cues for safe hand placement prior to standing.  Ambulation/Gait Ambulation/Gait assistance: Min assist Gait Distance (Feet): 35 Feet Assistive device: Rolling walker (2 wheeled) Gait Pattern/deviations: Step-to pattern;Step-through pattern;Decreased step length - right;Decreased step length - left;Decreased stride length;Antalgic Gait velocity: decreased   General Gait Details: Pt demonstrated step to and step through pattern throughout gait trial.  His gait continues to be antalgic.  Pt required 1-2 standing rest breaks to relieve pain of L LE.  Pt's gait continues to be significantly decreased.  VC provided for pt to look forward.   Stairs             Wheelchair Mobility    Modified Rankin (Stroke Patients Only) Modified Rankin (Stroke Patients Only) Pre-Morbid Rankin Score: No symptoms Modified Rankin: Moderately severe disability     Balance Overall balance assessment: Needs assistance Sitting-balance support: Feet supported;No upper extremity supported Sitting balance-Leahy Scale: Good     Standing balance support: Bilateral upper extremity supported;During functional activity Standing balance-Leahy Scale: Poor Standing balance comment: needs external support in standing.  Cognition Arousal/Alertness: Awake/alert Behavior During Therapy: WFL for tasks assessed/performed Overall Cognitive Status: Within Functional Limits for tasks assessed                                 General Comments: Pt has decreased awareness of safety and deficits.  He is impulsive at times.      Exercises General Exercises - Lower Extremity Long Arc Quad:  AROM;Right;Seated(2x10; deferred exercise on L due to pain) Hip Flexion/Marching: AROM;Right;Seated(2x10; deferred exercise on L due to pain)    General Comments        Pertinent Vitals/Pain Pain Assessment: 0-10 Pain Score: 4  Pain Location: L leg Pain Descriptors / Indicators: Discomfort;Grimacing(pulling sensation on back of L leg into low back) Pain Intervention(s): Limited activity within patient's tolerance;Monitored during session;Repositioned    Home Living                      Prior Function            PT Goals (current goals can now be found in the care plan section) Acute Rehab PT Goals Patient Stated Goal: none stated PT Goal Formulation: With patient Time For Goal Achievement: 05/14/18 Potential to Achieve Goals: Good Progress towards PT goals: Progressing toward goals    Frequency    Min 4X/week      PT Plan Current plan remains appropriate    Co-evaluation              AM-PAC PT "6 Clicks" Mobility   Outcome Measure  Help needed turning from your back to your side while in a flat bed without using bedrails?: None Help needed moving from lying on your back to sitting on the side of a flat bed without using bedrails?: None Help needed moving to and from a bed to a chair (including a wheelchair)?: None Help needed standing up from a chair using your arms (e.g., wheelchair or bedside chair)?: None Help needed to walk in hospital room?: A Little Help needed climbing 3-5 steps with a railing? : A Little 6 Click Score: 22    End of Session Equipment Utilized During Treatment: Gait belt Activity Tolerance: Patient limited by pain Patient left: in chair;with call bell/phone within reach;with chair alarm set Nurse Communication: Mobility status PT Visit Diagnosis: Muscle weakness (generalized) (M62.81);Difficulty in walking, not elsewhere classified (R26.2);Pain Pain - Right/Left: Left Pain - part of body: Leg     Time: 1414-1435 PT  Time Calculation (min) (ACUTE ONLY): 21 min  Charges:  $Gait Training: 8-22 mins                     [address] SPTA    Lucius Wise 05/05/2018, 4:41 PM
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
## 2018-05-06 ENCOUNTER — Encounter (HOSPITAL_COMMUNITY): Payer: Self-pay | Admitting: Certified Registered Nurse Anesthetist

## 2018-05-06 ENCOUNTER — Inpatient Hospital Stay (HOSPITAL_COMMUNITY): Payer: Medicare Other | Admitting: Certified Registered Nurse Anesthetist

## 2018-05-06 ENCOUNTER — Inpatient Hospital Stay (HOSPITAL_COMMUNITY): Payer: Medicare Other

## 2018-05-06 ENCOUNTER — Encounter (HOSPITAL_COMMUNITY)
Admission: EM | Disposition: A | Discharge status: Home Health | Payer: Self-pay | Source: Home / Self Care | Attending: Internal Medicine

## 2018-05-06 HISTORY — PX: LUMBAR LAMINECTOMY/DECOMPRESSION MICRODISCECTOMY: SHX5026

## 2018-05-06 LAB — BASIC METABOLIC PANEL
Anion gap: 11 (ref 5–15)
BUN: 16 mg/dL (ref 8–23)
CHLORIDE: 100 mmol/L (ref 98–111)
CO2: 26 mmol/L (ref 22–32)
Calcium: 9.3 mg/dL (ref 8.9–10.3)
Creatinine, Ser: 0.93 mg/dL (ref 0.61–1.24)
GFR calc Af Amer: >60 mL/min (ref >60)
GFR calc non Af Amer: >60 mL/min (ref >60)
Glucose, Bld: 119 mg/dL — ABNORMAL HIGH (ref 70–99)
Potassium: 4.2 mmol/L (ref 3.5–5.1)
Sodium: 137 mmol/L (ref 135–145)

## 2018-05-06 LAB — CBC
HEMATOCRIT: 43.3 % (ref 39.0–52.0)
Hemoglobin: 14.9 g/dL (ref 13.0–17.0)
MCH: 31.1 pg (ref 26.0–34.0)
MCHC: 34.4 g/dL (ref 30.0–36.0)
MCV: 90.4 fL (ref 80.0–100.0)
NRBC: 0 % (ref 0.0–0.2)
Platelets: 209 10*3/uL (ref 150–400)
RBC: 4.79 MIL/uL (ref 4.22–5.81)
RDW: 12.2 % (ref 11.5–15.5)
WBC: 7.5 10*3/uL (ref 4.0–10.5)

## 2018-05-06 LAB — SURGICAL PCR SCREEN
MRSA, PCR: NEGATIVE
Staphylococcus aureus: NEGATIVE

## 2018-05-06 SURGERY — LUMBAR LAMINECTOMY/DECOMPRESSION MICRODISCECTOMY 1 LEVEL
Anesthesia: General | Site: Spine Lumbar | Laterality: Bilateral

## 2018-05-06 MED ORDER — ROCURONIUM BROMIDE 50 MG/5ML IV SOSY
PREFILLED_SYRINGE | INTRAVENOUS | Status: AC
  Filled 2018-05-06: qty 5

## 2018-05-06 MED ORDER — POLYETHYLENE GLYCOL 3350 17 G PO PACK: 17.0000 g | PACK | Freq: Every day | ORAL | Status: DC | PRN

## 2018-05-06 MED ORDER — THROMBIN 5000 UNITS EX SOLR
CUTANEOUS | Status: AC
  Filled 2018-05-06: qty 5000

## 2018-05-06 MED ORDER — KETOROLAC TROMETHAMINE 15 MG/ML IJ SOLN
7.5000 mg | Freq: Four times a day (QID) | INTRAMUSCULAR | Status: AC
  Administered 2018-05-06 – 2018-05-07 (×2): 7.5 mg via INTRAVENOUS
  Filled 2018-05-06 (×2): qty 1

## 2018-05-06 MED ORDER — FLEET ENEMA 7-19 GM/118ML RE ENEM: 1.0000 | ENEMA | Freq: Once | RECTAL | Status: DC | PRN

## 2018-05-06 MED ORDER — LIDOCAINE 2% (20 MG/ML) 5 ML SYRINGE
INTRAMUSCULAR | Status: DC | PRN
  Administered 2018-05-06: 80 mg via INTRAVENOUS

## 2018-05-06 MED ORDER — DOCUSATE SODIUM 100 MG PO CAPS
100.0000 mg | ORAL_CAPSULE | Freq: Two times a day (BID) | ORAL | Status: DC
  Administered 2018-05-06 – 2018-05-08 (×4): 100 mg via ORAL
  Filled 2018-05-06 (×4): qty 1

## 2018-05-06 MED ORDER — ONDANSETRON HCL 4 MG/2ML IJ SOLN
INTRAMUSCULAR | Status: AC
  Filled 2018-05-06: qty 2

## 2018-05-06 MED ORDER — ONDANSETRON HCL 4 MG/2ML IJ SOLN: 4.0000 mg | Freq: Four times a day (QID) | INTRAMUSCULAR | Status: DC | PRN

## 2018-05-06 MED ORDER — SENNA 8.6 MG PO TABS
1.0000 | ORAL_TABLET | Freq: Two times a day (BID) | ORAL | Status: DC
  Administered 2018-05-06 – 2018-05-08 (×3): 8.6 mg via ORAL
  Filled 2018-05-06 (×4): qty 1

## 2018-05-06 MED ORDER — FENTANYL CITRATE (PF) 250 MCG/5ML IJ SOLN
INTRAMUSCULAR | Status: DC | PRN
  Administered 2018-05-06: 50 ug via INTRAVENOUS
  Administered 2018-05-06: 100 ug via INTRAVENOUS
  Administered 2018-05-06: 50 ug via INTRAVENOUS

## 2018-05-06 MED ORDER — PHENOL 1.4 % MT LIQD: 1.0000 | OROMUCOSAL | Status: DC | PRN

## 2018-05-06 MED ORDER — LACTATED RINGERS IV SOLN
INTRAVENOUS | Status: DC
  Administered 2018-05-06 (×2): via INTRAVENOUS

## 2018-05-06 MED ORDER — FENTANYL CITRATE (PF) 250 MCG/5ML IJ SOLN
INTRAMUSCULAR | Status: AC
  Filled 2018-05-06: qty 5

## 2018-05-06 MED ORDER — SODIUM CHLORIDE 0.9% FLUSH
3.0000 mL | Freq: Two times a day (BID) | INTRAVENOUS | Status: DC
  Administered 2018-05-06 – 2018-05-07 (×3): 3 mL via INTRAVENOUS

## 2018-05-06 MED ORDER — 0.9 % SODIUM CHLORIDE (POUR BTL) OPTIME
TOPICAL | Status: DC | PRN
  Administered 2018-05-06: 1000 mL

## 2018-05-06 MED ORDER — LIDOCAINE-EPINEPHRINE 1 %-1:100000 IJ SOLN
INTRAMUSCULAR | Status: AC
  Filled 2018-05-06: qty 1

## 2018-05-06 MED ORDER — BUPIVACAINE HCL (PF) 0.5 % IJ SOLN
INTRAMUSCULAR | Status: AC
  Filled 2018-05-06: qty 30

## 2018-05-06 MED ORDER — ONDANSETRON HCL 4 MG PO TABS: 4.0000 mg | ORAL_TABLET | Freq: Four times a day (QID) | ORAL | Status: DC | PRN

## 2018-05-06 MED ORDER — DEXAMETHASONE SODIUM PHOSPHATE 10 MG/ML IJ SOLN
INTRAMUSCULAR | Status: AC
  Filled 2018-05-06: qty 2

## 2018-05-06 MED ORDER — SODIUM CHLORIDE 0.9% FLUSH: 3.0000 mL | INTRAVENOUS | Status: DC | PRN

## 2018-05-06 MED ORDER — DEXAMETHASONE SODIUM PHOSPHATE 10 MG/ML IJ SOLN
INTRAMUSCULAR | Status: DC | PRN
  Administered 2018-05-06: 10 mg via INTRAVENOUS

## 2018-05-06 MED ORDER — PROPOFOL 10 MG/ML IV BOLUS
INTRAVENOUS | Status: DC | PRN
  Administered 2018-05-06: 120 mg via INTRAVENOUS
  Administered 2018-05-06: 50 mg via INTRAVENOUS

## 2018-05-06 MED ORDER — FENTANYL CITRATE (PF) 100 MCG/2ML IJ SOLN
INTRAMUSCULAR | Status: AC
  Filled 2018-05-06: qty 2

## 2018-05-06 MED ORDER — FENTANYL CITRATE (PF) 100 MCG/2ML IJ SOLN
25.0000 ug | INTRAMUSCULAR | Status: DC | PRN
  Administered 2018-05-06: 25 ug via INTRAVENOUS

## 2018-05-06 MED ORDER — ROCURONIUM BROMIDE 10 MG/ML (PF) SYRINGE
PREFILLED_SYRINGE | INTRAVENOUS | Status: DC | PRN
  Administered 2018-05-06: 50 mg via INTRAVENOUS

## 2018-05-06 MED ORDER — PROPOFOL 10 MG/ML IV BOLUS
INTRAVENOUS | Status: AC
  Filled 2018-05-06: qty 20

## 2018-05-06 MED ORDER — LACTATED RINGERS IV SOLN
INTRAVENOUS | Status: DC
  Administered 2018-05-06: 22:00:00 via INTRAVENOUS

## 2018-05-06 MED ORDER — ONDANSETRON HCL 4 MG/2ML IJ SOLN
INTRAMUSCULAR | Status: AC
  Filled 2018-05-06: qty 4

## 2018-05-06 MED ORDER — SUGAMMADEX SODIUM 200 MG/2ML IV SOLN
INTRAVENOUS | Status: DC | PRN
  Administered 2018-05-06: 200 mg via INTRAVENOUS

## 2018-05-06 MED ORDER — SODIUM CHLORIDE 0.9 % IV SOLN
INTRAVENOUS | Status: DC | PRN
  Administered 2018-05-06: 18:00:00

## 2018-05-06 MED ORDER — THROMBIN 5000 UNITS EX SOLR
OROMUCOSAL | Status: DC | PRN
  Administered 2018-05-06: 18:00:00 via TOPICAL

## 2018-05-06 MED ORDER — MENTHOL 3 MG MT LOZG: 1.0000 | LOZENGE | OROMUCOSAL | Status: DC | PRN

## 2018-05-06 MED ORDER — BUPIVACAINE HCL (PF) 0.5 % IJ SOLN
INTRAMUSCULAR | Status: DC | PRN
  Administered 2018-05-06: 20 mL
  Administered 2018-05-06: 4 mL

## 2018-05-06 MED ORDER — MORPHINE SULFATE (PF) 2 MG/ML IV SOLN: 2.0000 mg | INTRAVENOUS | Status: DC | PRN

## 2018-05-06 MED ORDER — LIDOCAINE 2% (20 MG/ML) 5 ML SYRINGE
INTRAMUSCULAR | Status: AC
  Filled 2018-05-06: qty 5

## 2018-05-06 MED ORDER — SODIUM CHLORIDE 0.9 % IV SOLN: 250.0000 mL | INTRAVENOUS | Status: DC

## 2018-05-06 MED ORDER — LIDOCAINE-EPINEPHRINE 1 %-1:100000 IJ SOLN
INTRAMUSCULAR | Status: DC | PRN
  Administered 2018-05-06: 4 mL

## 2018-05-06 MED ORDER — POLYETHYLENE GLYCOL 3350 17 G PO PACK: 17.0000 g | PACK | Freq: Every day | ORAL | Status: DC

## 2018-05-06 MED ORDER — CEFAZOLIN SODIUM-DEXTROSE 2-4 GM/100ML-% IV SOLN
2.0000 g | INTRAVENOUS | Status: AC
  Administered 2018-05-06: 2 g via INTRAVENOUS
  Filled 2018-05-06: qty 100

## 2018-05-06 SURGICAL SUPPLY — 44 items
BAG DECANTER FOR FLEXI CONT (MISCELLANEOUS) ×3 IMPLANT
BUR MATCHSTICK NEURO 3.0 LAGG (BURR) ×3 IMPLANT
CANISTER SUCT 3000ML PPV (MISCELLANEOUS) ×3 IMPLANT
DERMABOND ADVANCED (GAUZE/BANDAGES/DRESSINGS) ×2
DERMABOND ADVANCED .7 DNX12 (GAUZE/BANDAGES/DRESSINGS) ×1 IMPLANT
DEVICE DISSECT PLASMABLAD 3.0S (MISCELLANEOUS) ×1 IMPLANT
DRAPE HALF SHEET 40X57 (DRAPES) ×3 IMPLANT
DRAPE LAPAROTOMY T 102X78X121 (DRAPES) ×3 IMPLANT
DRSG OPSITE POSTOP 4X6 (GAUZE/BANDAGES/DRESSINGS) ×3 IMPLANT
DURAPREP 26ML APPLICATOR (WOUND CARE) ×3 IMPLANT
ELECT REM PT RETURN 9FT ADLT (ELECTROSURGICAL) ×3
ELECTRODE REM PT RTRN 9FT ADLT (ELECTROSURGICAL) ×1 IMPLANT
GAUZE SPONGE 4X4 12PLY STRL (GAUZE/BANDAGES/DRESSINGS) IMPLANT
GLOVE BIOGEL PI IND STRL 7.0 (GLOVE) ×4 IMPLANT
GLOVE BIOGEL PI IND STRL 7.5 (GLOVE) ×1 IMPLANT
GLOVE BIOGEL PI IND STRL 8.5 (GLOVE) ×2 IMPLANT
GLOVE BIOGEL PI INDICATOR 7.0 (GLOVE) ×8
GLOVE BIOGEL PI INDICATOR 7.5 (GLOVE) ×2
GLOVE BIOGEL PI INDICATOR 8.5 (GLOVE) ×4
GLOVE ECLIPSE 8.5 STRL (GLOVE) ×3 IMPLANT
GLOVE SS N UNI LF 6.5 STRL (GLOVE) ×9 IMPLANT
GOWN STRL REUS W/ TWL LRG LVL3 (GOWN DISPOSABLE) ×2 IMPLANT
GOWN STRL REUS W/ TWL XL LVL3 (GOWN DISPOSABLE) IMPLANT
GOWN STRL REUS W/TWL 2XL LVL3 (GOWN DISPOSABLE) ×3 IMPLANT
GOWN STRL REUS W/TWL LRG LVL3 (GOWN DISPOSABLE) ×4
GOWN STRL REUS W/TWL XL LVL3 (GOWN DISPOSABLE)
HEMOSTAT POWDER KIT SURGIFOAM (HEMOSTASIS) ×3 IMPLANT
KIT BASIN OR (CUSTOM PROCEDURE TRAY) ×3 IMPLANT
KIT TURNOVER KIT B (KITS) ×3 IMPLANT
NEEDLE HYPO 22GX1.5 SAFETY (NEEDLE) ×3 IMPLANT
NEEDLE SPNL 20GX3.5 QUINCKE YW (NEEDLE) ×3 IMPLANT
NS IRRIG 1000ML POUR BTL (IV SOLUTION) ×3 IMPLANT
PACK LAMINECTOMY NEURO (CUSTOM PROCEDURE TRAY) ×3 IMPLANT
PAD ARMBOARD 7.5X6 YLW CONV (MISCELLANEOUS) ×9 IMPLANT
PATTIES SURGICAL .5 X1 (DISPOSABLE) ×3 IMPLANT
PLASMABLADE 3.0S (MISCELLANEOUS) ×3
RUBBERBAND STERILE (MISCELLANEOUS) IMPLANT
SUT VIC AB 1 CT1 18XBRD ANBCTR (SUTURE) ×1 IMPLANT
SUT VIC AB 1 CT1 8-18 (SUTURE) ×2
SUT VIC AB 2-0 CP2 18 (SUTURE) ×3 IMPLANT
SUT VIC AB 3-0 SH 8-18 (SUTURE) ×3 IMPLANT
TOWEL GREEN STERILE (TOWEL DISPOSABLE) ×3 IMPLANT
TOWEL GREEN STERILE FF (TOWEL DISPOSABLE) ×3 IMPLANT
WATER STERILE IRR 1000ML POUR (IV SOLUTION) ×3 IMPLANT

## 2018-05-06 NOTE — Progress Notes (Signed)
Vital signs are stable Patient is doing well postop We will mobilize we will mobilize him tomorrow His dressing is clean and dry.Patient ID: [name] [name], male   DOB: [birth year], 72 y.o.   MRN: [id number]
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
## 2018-05-06 NOTE — Anesthesia Preprocedure Evaluation (Addendum)
Anesthesia Evaluation  Patient identified by MRN, date of birth, ID band Patient awake    Reviewed: Allergy & Precautions, NPO status , Patient's Chart, lab work & pertinent test results  History of Anesthesia Complications Negative for: history of anesthetic complications  Airway Mallampati: II  TM Distance: >3 FB Neck ROM: Full    Dental  (+) Edentulous Upper, Edentulous Lower   Pulmonary former smoker (quit 2015),    breath sounds clear to auscultation       Cardiovascular hypertension, Pt. 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   Neuro/Psych Chronic back pain: narcotics CVA (recent CVA in 11/2017 requiring TPA), No Residual Symptoms negative psych ROS   GI/Hepatic Neg liver ROS, GERD  Controlled and Medicated,  Endo/Other  obese  Renal/GU negative Renal ROS     Musculoskeletal  (+) Arthritis ,   Abdominal (+) + obese,   Peds  Hematology negative hematology ROS (+)   Anesthesia Other Findings   Reproductive/Obstetrics                            Anesthesia Physical Anesthesia Plan  ASA: III  Anesthesia Plan: General   Post-op Pain Management:    Induction: Intravenous  PONV Risk Score and Plan: 2 and Ondansetron and Dexamethasone  Airway Management Planned: Oral ETT  Additional Equipment:   Intra-op Plan:   Post-operative Plan: Extubation in OR  Informed Consent: I have reviewed the patients History and Physical, chart, labs and discussed the procedure including the risks, benefits and alternatives for the proposed anesthesia with the patient or authorized representative who has indicated his/her understanding and acceptance.     Plan Discussed with: CRNA and Surgeon  Anesthesia Plan Comments: (Plan routine monitors, GETA)       Anesthesia Quick  Evaluation

## 2018-05-06 NOTE — Transfer of Care (Signed)
Immediate Anesthesia Transfer of Care Note  Patient: [name] [name]  Procedure(s) Performed: Bilateral Lumbar Four-Five Laminectomy/Foraminotomy (Bilateral Spine Lumbar)  Patient Location: PACU  Anesthesia Type:General  Level of Consciousness: awake, alert , oriented and patient cooperative  Airway & Oxygen Therapy: Patient Spontanous Breathing and Patient connected to nasal cannula oxygen  Post-op Assessment: Report given to RN, Post -op Vital signs reviewed and stable and Patient moving all extremities  Post vital signs: Reviewed and stable  Last Vitals:  Vitals Value Taken Time  BP 146/69 05/06/2018  6:59 PM  Temp 36.5 C 05/06/2018  6:59 PM  Pulse 79 05/06/2018  7:01 PM  Resp 13 05/06/2018  7:01 PM  SpO2 96 % 05/06/2018  7:01 PM  Vitals shown include unvalidated device data.  Last Pain:  Vitals:   05/06/18 1136  TempSrc: Oral  PainSc:       Patients Stated Pain Goal: 2 (05/06/18 2993)  Complications: No apparent anesthesia complications

## 2018-05-06 NOTE — Progress Notes (Signed)
Patient ID: [name] [name], male   DOB: [birth year], 72 y.o.   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

## 2018-05-06 NOTE — Anesthesia Procedure Notes (Signed)
Procedure Name: Intubation Date/Time: 05/06/2018 5:18 PM Performed by: White, Amedeo Plenty, CRNA Pre-anesthesia Checklist: Patient identified, Emergency Drugs available, Suction available and Patient being monitored Patient Re-evaluated:Patient Re-evaluated prior to induction Oxygen Delivery Method: Circle System Utilized Preoxygenation: Pre-oxygenation with 100% oxygen Induction Type: IV induction Ventilation: Mask ventilation without difficulty Laryngoscope Size: Mac and 4 Grade View: Grade I Tube type: Oral Tube size: 7.5 mm Number of attempts: 1 Airway Equipment and Method: Stylet and Oral airway Placement Confirmation: ETT inserted through vocal cords under direct vision,  positive ETCO2 and breath sounds checked- equal and bilateral Secured at: 22 cm Tube secured with: Tape Dental Injury: Teeth and Oropharynx as per pre-operative assessment

## 2018-05-07 ENCOUNTER — Encounter (HOSPITAL_COMMUNITY): Payer: Self-pay | Admitting: Neurological Surgery

## 2018-05-07 LAB — BASIC METABOLIC PANEL
Anion gap: 11 (ref 5–15)
BUN: 16 mg/dL (ref 8–23)
CO2: 27 mmol/L (ref 22–32)
Calcium: 9.3 mg/dL (ref 8.9–10.3)
Chloride: 96 mmol/L — ABNORMAL LOW (ref 98–111)
Creatinine, Ser: 0.96 mg/dL (ref 0.61–1.24)
GFR calc Af Amer: >60 mL/min (ref >60)
GFR calc non Af Amer: >60 mL/min (ref >60)
Glucose, Bld: 221 mg/dL — ABNORMAL HIGH (ref 70–99)
Potassium: 4.8 mmol/L (ref 3.5–5.1)
Sodium: 134 mmol/L — ABNORMAL LOW (ref 135–145)

## 2018-05-07 LAB — CBC
HCT: 45.7 % (ref 39.0–52.0)
Hemoglobin: 15.7 g/dL (ref 13.0–17.0)
MCH: 31.2 pg (ref 26.0–34.0)
MCHC: 34.4 g/dL (ref 30.0–36.0)
MCV: 90.9 fL (ref 80.0–100.0)
Platelets: 250 10*3/uL (ref 150–400)
RBC: 5.03 MIL/uL (ref 4.22–5.81)
RDW: 12.1 % (ref 11.5–15.5)
WBC: 15.7 10*3/uL — ABNORMAL HIGH (ref 4.0–10.5)
nRBC: 0 % (ref 0.0–0.2)

## 2018-05-07 MED ORDER — CEFAZOLIN SODIUM-DEXTROSE 2-4 GM/100ML-% IV SOLN
2.0000 g | Freq: Three times a day (TID) | INTRAVENOUS | Status: AC
  Administered 2018-05-07 (×2): 2 g via INTRAVENOUS
  Filled 2018-05-07 (×2): qty 100

## 2018-05-07 MED ORDER — ALUM & MAG HYDROXIDE-SIMETH 200-200-20 MG/5ML PO SUSP: 30.0000 mL | Freq: Four times a day (QID) | ORAL | Status: DC | PRN

## 2018-05-07 MED ORDER — ACETAMINOPHEN 325 MG PO TABS: 650.0000 mg | ORAL_TABLET | ORAL | Status: DC | PRN

## 2018-05-07 MED ORDER — BISACODYL 10 MG RE SUPP: 10.0000 mg | Freq: Every day | RECTAL | Status: DC | PRN

## 2018-05-07 MED ORDER — POLYETHYLENE GLYCOL 3350 17 G PO PACK
17.0000 g | PACK | Freq: Two times a day (BID) | ORAL | Status: DC
  Administered 2018-05-07: 17 g via ORAL
  Filled 2018-05-07 (×3): qty 1

## 2018-05-07 MED ORDER — ACETAMINOPHEN 650 MG RE SUPP: 650.0000 mg | RECTAL | Status: DC | PRN

## 2018-05-07 MED ORDER — MAGNESIUM CITRATE PO SOLN
1.0000 | Freq: Once | ORAL | Status: AC
  Administered 2018-05-07: 1 via ORAL
  Filled 2018-05-07: qty 296

## 2018-05-07 NOTE — Evaluation (Signed)
Physical Therapy Re-Evaluation Patient Details Name: TREVYON [name] MRN: [id number] DOB: [birth year] Today's Date: 05/07/2018   History of Present Illness  72 y.o. male admitted on 04/30/18 for L leg weakness.  Suspected stroke.  CT was negative, unable to preform MRI due to metal in his body.  Pt refused tPA.  Pt with significant PMH of HOH, stroke, CAD, CHF, back pain, L RTC repair, neck surgery, back surgery, ankle reconstruction.  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 Now s/p Bilateral laminotomies and decompression of severe stenosis L4-L5 on 05/06/18.  Clinical Impression  Patient now s/p decompressive laminectomy as noted above and with much improved activity tolerance and able to ambulate as per his preference without walker.  Feel continued skilled PT need due to now needing education in precautions and safety as well as for balance training due to prefers no walker.  PT to follow acutely and continue to recommend HHPT follow up initially upon d/c.     Follow Up Recommendations Home health PT    Equipment Recommendations  None recommended by PT    Recommendations for Other Services       Precautions / Restrictions Precautions Precautions: Fall;Back Precaution Comments: reviewed precautions just education by OT and pt without recollection      Mobility  Bed Mobility   Bed Mobility: Sit to Sidelying;Sidelying to Sit Rolling: Supervision Sidelying to sit: Supervision     Sit to sidelying: Supervision General bed mobility comments: demonstrated appropriate technique to bed at height to simulate bed at home and HOB flat, discussed if did without rails as rail was still hip and he used minimally  Transfers Overall transfer level: Needs assistance Equipment used: None Transfers: Sit to/from Stand Sit to Stand: Min guard         General transfer comment: assist for  safety  Ambulation/Gait Ambulation/Gait assistance: Min guard;Supervision Gait Distance (Feet): 150 Feet Assistive device: None Gait Pattern/deviations: Step-through pattern;Decreased stride length;Antalgic     General Gait Details: very mild antalgia, reports only pain is in his surgical site  Stairs Stairs: Yes Stairs assistance: Min guard Stair Management: Forwards;Step to pattern;One rail Right Number of Stairs: 2 General stair comments: cues for using only the R railing and pt demonstrated step to sequence, educated appropriate to use this technique due to precautions  Wheelchair Mobility    Modified Rankin (Stroke Patients Only)       Balance Overall balance assessment: Needs assistance   Sitting balance-Leahy Scale: Good       Standing balance-Leahy Scale: Good Standing balance comment: static standing & dynamic washing hands with S                             Pertinent Vitals/Pain Pain Score: 3  Pain Location: back Pain Descriptors / Indicators: Operative site guarding Pain Intervention(s): Monitored during session;Repositioned    Home Living Family/patient expects to be discharged to:: Private residence Living Arrangements: Spouse/significant other Available Help at Discharge: Family;Available PRN/intermittently Type of Home: House Home Access: Stairs to enter   Entrance Stairs-Number of Steps: 3 Home Layout: One level Home Equipment: Walker - 2 wheels;Walker - 4 wheels;Cane - single point Additional Comments: DME belongs to wife    Prior Function Level of Independence: Independent         Comments: works in Production designer, theatre/television/film for Teacher, music  Dominant Hand: Right    Extremity/Trunk Assessment   Upper Extremity Assessment Upper Extremity Assessment: Defer to OT evaluation    Lower Extremity Assessment Lower Extremity Assessment: Generalized weakness       Communication   Communication: HOH  Cognition  Arousal/Alertness: Awake/alert Behavior During Therapy: WFL for tasks assessed/performed Overall Cognitive Status: Within Functional Limits for tasks assessed                                        General Comments      Exercises     Assessment/Plan    PT Assessment Patient needs continued PT services  PT Problem List Pain;Decreased mobility;Decreased knowledge of precautions;Decreased strength       PT Treatment Interventions DME instruction;Gait training;Stair training;Functional mobility training;Therapeutic activities;Therapeutic exercise;Balance training;Patient/family education    PT Goals (Current goals can be found in the Care Plan section)  Acute Rehab PT Goals Patient Stated Goal: to go home by the weekend PT Goal Formulation: With patient Time For Goal Achievement: 05/14/18 Potential to Achieve Goals: Good    Frequency Min 5X/week   Barriers to discharge        Co-evaluation               AM-PAC PT "6 Clicks" Mobility  Outcome Measure Help needed turning from your back to your side while in a flat bed without using bedrails?: A Little Help needed moving from lying on your back to sitting on the side of a flat bed without using bedrails?: A Little Help needed moving to and from a bed to a chair (including a wheelchair)?: A Little Help needed standing up from a chair using your arms (e.g., wheelchair or bedside chair)?: A Little Help needed to walk in hospital room?: A Little Help needed climbing 3-5 steps with a railing? : A Little 6 Click Score: 18    End of Session Equipment Utilized During Treatment: Gait belt Activity Tolerance: Patient tolerated treatment well Patient left: with call bell/phone within reach;in bed(sitting EOB)   PT Visit Diagnosis: Difficulty in walking, not elsewhere classified (R26.2)    Time: 0165-5374 PT Time Calculation (min) (ACUTE ONLY): 19 min   Charges:   PT Evaluation $PT Re-evaluation: 1  Re-eval          Sheran Lawless, PT Acute Rehabilitation Services [phone] 05/07/2018   [name] [name] 05/07/2018, 5:59 PM

## 2018-05-07 NOTE — Progress Notes (Signed)
Occupational Therapy Treatment Patient Details Name: [name] [name] MRN: [id number] DOB: 12-Jan-1946 Today's Date: 05/07/2018    History of present illness 72 y.o. male admitted on 04/30/18 for L leg weakness.  Suspected stroke.  CT was negative, unable to preform MRI due to metal in his body.  Pt refused tPA.  Pt with significant PMH of HOH, stroke, CAD, CHF, back pain, L RTC repair, neck surgery, back surgery, ankle reconstruction.  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 Now s/p Bilateral laminotomies and decompression of severe stenosis L4-L5 on 05/06/18.   OT comments  Pt seen s/p back surgery on 05/06/18. Pt progressing well towards acute OT goals. Reports improvement in LLE. Educated on back precautions and strategies with ADLs. 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 Encourage pt to discuss this with MD when discussing return to work timeline. D/c plan remains appropriate.   Follow Up Recommendations  Home health OT    Equipment Recommendations  Tub/shower bench    Recommendations for Other Services      Precautions / Restrictions Precautions Precautions: Fall;Back Precaution Booklet Issued: Yes (comment) Restrictions Weight Bearing Restrictions: No       Mobility Bed Mobility Overal bed mobility: Needs Assistance Bed Mobility: Rolling;Sidelying to Sit Rolling: Supervision;Min guard Sidelying to sit: Min guard          Transfers Overall transfer level: Needs assistance Equipment used: Rolling walker (2 wheeled) Transfers: Sit to/from Stand Sit to Stand: Min guard              Balance Overall balance assessment: Needs assistance Sitting-balance support: Feet supported;No upper extremity supported Sitting balance-Leahy Scale: Good     Standing balance support: Bilateral upper extremity supported;During  functional activity Standing balance-Leahy Scale: Poor Standing balance comment: needs external support in standing.                            ADL either performed or assessed with clinical judgement   ADL Overall ADL's : Needs assistance/impaired                         Toilet Transfer: Min guard;Ambulation;Comfort height toilet;Grab bars;RW           Functional mobility during ADLs: Min guard;Rolling walker General ADL Comments: Pt completed bed mobility, functional mobility in the room, and toilet transfer. Educated on BAT back precautions     Vision       Perception     Praxis      Cognition Arousal/Alertness: Awake/alert Behavior During Therapy: WFL for tasks assessed/performed Overall Cognitive Status: Within Functional Limits for tasks assessed                                 General Comments: Pt has decreased awareness of safety and deficits.  He is impulsive at times.        Exercises     Shoulder Instructions       General Comments      Pertinent Vitals/ Pain       Pain Assessment: Faces Faces Pain Scale: Hurts little more Pain Location: back Pain Descriptors / Indicators: Sore Pain Intervention(s): Monitored during session  Home Living  Prior Functioning/Environment              Frequency  Min 2X/week        Progress Toward Goals  OT Goals(current goals can now be found in the care plan section)  Progress towards OT goals: Progressing toward goals  Acute Rehab OT Goals Patient Stated Goal: none stated OT Goal Formulation: With patient Time For Goal Achievement: 05/15/18 Potential to Achieve Goals: Good ADL Goals Pt Will Perform Grooming: with supervision;standing Pt Will Perform Lower Body Bathing: with supervision;with adaptive equipment;sit to/from stand Pt Will Perform Lower Body Dressing: with supervision;with adaptive  equipment;sit to/from stand Pt Will Transfer to Toilet: with supervision;ambulating Pt Will Perform Toileting - Clothing Manipulation and hygiene: with supervision;sit to/from stand Pt Will Perform Tub/Shower Transfer: Tub transfer;with supervision;ambulating;tub bench;rolling walker  Plan Discharge plan remains appropriate;Frequency remains appropriate    Co-evaluation                 AM-PAC OT "6 Clicks" Daily Activity     Outcome Measure   Help from another person eating meals?: None Help from another person taking care of personal grooming?: A Little Help from another person toileting, which includes using toliet, bedpan, or urinal?: A Little Help from another person bathing (including washing, rinsing, drying)?: A Little Help from another person to put on and taking off regular upper body clothing?: None Help from another person to put on and taking off regular lower body clothing?: A Lot 6 Click Score: 19    End of Session Equipment Utilized During Treatment: Rolling walker  OT Visit Diagnosis: Unsteadiness on feet (R26.81);Other abnormalities of gait and mobility (R26.89);Pain Pain - Right/Left: Left Pain - part of body: Leg   Activity Tolerance Patient tolerated treatment well   Patient Left in chair;with call bell/phone within reach;with chair alarm set   Nurse Communication          Time: 1610-9604 OT Time Calculation (min): 19 min  Charges: OT General Charges $OT Visit: 1 Visit OT Treatments $Self Care/Home Management : 8-22 mins  Raynald Kemp, OT Acute Rehabilitation Services Pager: [phone] Office: [phone]    Pilar Grammes 05/07/2018, 10:49 AM
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
## 2018-05-07 NOTE — Progress Notes (Signed)
Patient ID: [name] [name], male   DOB: February 24, 1946, 72 y.o.   MRN: [id number] Vital signs are stable Clinically appears to be improving substantially He is ambulatory today moving the left leg much better than he did before Patient himself is quite pleased with his progress As he improves with physical therapy can be discharged whenever he is ready to go home.

## 2018-05-08 DIAGNOSIS — M5441 Lumbago with sciatica, right side: Secondary | ICD-10-CM

## 2018-05-08 DIAGNOSIS — G8929 Other chronic pain: Secondary | ICD-10-CM

## 2018-05-08 LAB — CBC
HCT: 39.8 % (ref 39.0–52.0)
Hemoglobin: 13.7 g/dL (ref 13.0–17.0)
MCH: 31.4 pg (ref 26.0–34.0)
MCHC: 34.4 g/dL (ref 30.0–36.0)
MCV: 91.3 fL (ref 80.0–100.0)
Platelets: 200 10*3/uL (ref 150–400)
RBC: 4.36 MIL/uL (ref 4.22–5.81)
RDW: 12.4 % (ref 11.5–15.5)
WBC: 11.6 10*3/uL — ABNORMAL HIGH (ref 4.0–10.5)
nRBC: 0 % (ref 0.0–0.2)

## 2018-05-08 LAB — BASIC METABOLIC PANEL
Anion gap: 9 (ref 5–15)
BUN: 17 mg/dL (ref 8–23)
CO2: 28 mmol/L (ref 22–32)
Calcium: 8.8 mg/dL — ABNORMAL LOW (ref 8.9–10.3)
Chloride: 98 mmol/L (ref 98–111)
Creatinine, Ser: 1.07 mg/dL (ref 0.61–1.24)
GFR calc Af Amer: >60 mL/min (ref >60)
GFR calc non Af Amer: >60 mL/min (ref >60)
Glucose, Bld: 145 mg/dL — ABNORMAL HIGH (ref 70–99)
POTASSIUM: 4.4 mmol/L (ref 3.5–5.1)
Sodium: 135 mmol/L (ref 135–145)

## 2018-05-08 MED ORDER — OXYCODONE HCL 10 MG PO TABS: 10.0000 mg | ORAL_TABLET | Freq: Two times a day (BID) | ORAL | 0 refills | Status: DC | PRN

## 2018-05-08 MED ORDER — POLYETHYLENE GLYCOL 3350 17 G PO PACK: 17.0000 g | PACK | Freq: Two times a day (BID) | ORAL | 0 refills | Status: AC

## 2018-05-08 MED ORDER — ACETAMINOPHEN 325 MG PO TABS: 650.0000 mg | ORAL_TABLET | ORAL | 0 refills | Status: DC | PRN

## 2018-05-08 MED ORDER — DOCUSATE SODIUM 100 MG PO CAPS: 100.0000 mg | ORAL_CAPSULE | Freq: Two times a day (BID) | ORAL | 0 refills | Status: DC

## 2018-05-08 NOTE — Care Management Note (Signed)
Case Management Note  Patient Details  Name: [name] [name] MRN: [id number] Date of Birth: [birth year]  Subjective/Objective:                    Action/Plan: Pt discharging home with [facility] services. Pt had selected [facility] last week. CM called Cory with [facility] and he accepted the referral.  No DME needs except tub bench. This is not covered by his insurance. He can pick up outside the hospital setting.  Pts wife to provide transport home.  Expected Discharge Date:  05/08/18               Expected Discharge Plan:  Home w Home Health Services  In-House Referral:     Discharge planning Services  CM Consult  Post Acute Care Choice:  Home Health Choice offered to:  Patient  DME Arranged:    DME Agency:     HH Arranged:  PT, OT HH Agency:  [facility]  Status of Service:  Completed, signed off  If discussed at Long Length of Stay Meetings, dates discussed:    Additional Comments:  Kermit Balo, RN 05/08/2018, 12:17 PM

## 2018-05-08 NOTE — Progress Notes (Signed)
Pt being discharged from hospital per orders from MD. Pt educated on discharge instructions. Pt verbalized understanding of instructions. All questions and concerns were addressed. Pt's IV was removed prior to discharge. Pt exited hospital via wheelchair accompanied by staff. 

## 2018-05-08 NOTE — Progress Notes (Addendum)
Tx performed and documented by SPTA under direct guidance and supervision of PTA at all times. This session was performed under the supervision of a licensed clinician.   Charting reviewed for accuracy and depicts tx performed and services provided.  Governor Rooks, PTA Acute Rehabilitation Services Pager [phone] Office [phone]     Physical Therapy Treatment Patient Details Name: [name] [name] MRN: [id number] DOB: [birth year] Today's Date: 05/08/2018    History of Present Illness 72 y.o. male admitted on 04/30/18 for L leg weakness.  Suspected stroke.  CT was negative, unable to preform MRI due to metal in his body.  Pt refused tPA.  Pt with significant PMH of HOH, stroke, CAD, CHF, back pain, L RTC repair, neck surgery, back surgery, ankle reconstruction.  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 Now s/p Bilateral laminotomies and decompression of severe stenosis L4-L5 on 05/06/18.    PT Comments    Pt oob upon arrival donning clothes to go home. Pt agreed to therapy. RN administered pain meds before gait training. Pt participated in gait and stair training this session. He required supervision with gait and Min guard for turns for safety. Pt required Min guard for ascending/ descending stairs. Pt able to recall back precautions but required cues to maintain. Tried to instruct pt in LE exercise but he c/o increased pain in low back.  I have discussed the patient's current level of function related to deficits (listed below) with the patient.  He acknowledged understanding of this and does not feel he would be able to have his care needs met at home. He is interested in [facility]. Pt would benefit from continued PT in order to progress toward stated goals and maximize functional independence. Pt remains appropriate for HHPT based on current functional status.     Follow Up Recommendations  Home health PT      Equipment Recommendations  None recommended by PT    Recommendations for Other Services       Precautions / Restrictions Precautions Precautions: Fall;Back Precaution Booklet Issued: Yes (comment) Precaution Comments: reviewed precautions just education by OT and pt without recollection Restrictions Weight Bearing Restrictions: No    Mobility  Bed Mobility               General bed mobility comments: oob  Transfers Overall transfer level: Needs assistance Equipment used: None Transfers: Sit to/from Stand Sit to Stand: Supervision         General transfer comment: Supervision for safety  Ambulation/Gait Ambulation/Gait assistance: Min guard;Supervision Gait Distance (Feet): 150 Feet Assistive device: None Gait Pattern/deviations: Step-through pattern;Decreased stride length;Antalgic;Trunk flexed Gait velocity: decreased   General Gait Details: very mild antalgia, reports only pain is in his surgical site. VC for maintaining upright posture. Min guard when making turns for safety.   Stairs   Stairs assistance: Min guard Stair Management: Forwards;Step to pattern;Two rails Number of Stairs: 3 General stair comments: pt demonstrated step to sequence, educated appropriate to use this technique due to precautions   Wheelchair Mobility    Modified Rankin (Stroke Patients Only) Modified Rankin (Stroke Patients Only) Pre-Morbid Rankin Score: No symptoms Modified Rankin: Moderately severe disability     Balance Overall balance assessment: Needs assistance Sitting-balance support: Feet supported;No upper extremity supported Sitting balance-Leahy Scale: Good Sitting balance - Comments: pt able to don shirt in sitting with supervison   Standing balance support: Bilateral upper extremity supported;During functional activity  Standing balance-Leahy Scale: Good Standing balance comment: static standing & dynamic balance with supervision for safety.                             Cognition Arousal/Alertness: Awake/alert Behavior During Therapy: WFL for tasks assessed/performed Overall Cognitive Status: Within Functional Limits for tasks assessed                                 General Comments: Pt able to recall precautions at end of session, but required cues to follow precautions throughout session      Exercises      General Comments General comments (skin integrity, edema, etc.): Pt reports that he is being discharged this afternoon.       Pertinent Vitals/Pain Pain Score: 6  Pain Location: back Pain Descriptors / Indicators: Operative site guarding Pain Intervention(s): Limited activity within patient's tolerance;Monitored during session;Repositioned;Patient requesting pain meds-RN notified;RN gave pain meds during session    Home Living                      Prior Function            PT Goals (current goals can now be found in the care plan section) Acute Rehab PT Goals Patient Stated Goal: to go home today PT Goal Formulation: With patient Time For Goal Achievement: 05/14/18 Potential to Achieve Goals: Good Progress towards PT goals: Progressing toward goals    Frequency    Min 5X/week      PT Plan Current plan remains appropriate    Co-evaluation              AM-PAC PT "6 Clicks" Mobility   Outcome Measure  Help needed turning from your back to your side while in a flat bed without using bedrails?: A Little Help needed moving from lying on your back to sitting on the side of a flat bed without using bedrails?: A Little Help needed moving to and from a bed to a chair (including a wheelchair)?: A Little Help needed standing up from a chair using your arms (e.g., wheelchair or bedside chair)?: A Little Help needed to walk in hospital room?: A Little Help needed climbing 3-5 steps with a railing? : A Little 6 Click Score: 18    End of Session Equipment Utilized During  Treatment: Gait belt Activity Tolerance: Patient tolerated treatment well Patient left: with call bell/phone within reach;in chair;with chair alarm set Nurse Communication: Mobility status PT Visit Diagnosis: Difficulty in walking, not elsewhere classified (R26.2)     Time: 1026-1050 PT Time Calculation (min) (ACUTE ONLY): 24 min  Charges:    $ therapeutic activity $gt training                    Halina Andreas, SPTA   [place] 05/08/2018, 11:08 AM

## 2018-05-08 NOTE — Anesthesia Postprocedure Evaluation (Signed)
Anesthesia Post Note  Patient: [name] [name]  Procedure(s) Performed: Bilateral Lumbar Four-Five Laminectomy/Foraminotomy (Bilateral Spine Lumbar)     Patient location during evaluation: PACU Anesthesia Type: General Level of consciousness: awake and alert Pain management: pain level controlled Vital Signs Assessment: post-procedure vital signs reviewed and stable Respiratory status: spontaneous breathing, nonlabored ventilation, respiratory function stable and patient connected to nasal cannula oxygen Cardiovascular status: blood pressure returned to baseline and stable Postop Assessment: no apparent nausea or vomiting Anesthetic complications: no    Last Vitals:  Vitals:   05/08/18 0404 05/08/18 0804  BP: 97/64 106/61  Pulse: 86 (!) 57  Resp: 18 16  Temp: 36.9 C (!) 36.4 C  SpO2: 95% 97%    Last Pain:  Vitals:   05/08/18 0804  TempSrc: Oral  PainSc:                  Bobbi Kozakiewicz S
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
## 2018-06-03 DIAGNOSIS — G459 Transient cerebral ischemic attack, unspecified: Secondary | ICD-10-CM

## 2018-06-03 HISTORY — DX: Transient cerebral ischemic attack, unspecified: G45.9

## 2018-12-12 ENCOUNTER — Emergency Department: Payer: Medicare Other

## 2018-12-12 ENCOUNTER — Other Ambulatory Visit: Payer: Self-pay

## 2018-12-12 ENCOUNTER — Inpatient Hospital Stay
Admission: EM | Admit: 2018-12-12 | Discharge: 2018-12-14 | DRG: 065 | Disposition: A | Discharge status: Home Health | Payer: Medicare Other | Attending: Internal Medicine | Admitting: Internal Medicine

## 2018-12-12 ENCOUNTER — Observation Stay: Payer: Medicare Other

## 2018-12-12 ENCOUNTER — Encounter: Payer: Self-pay | Admitting: Radiology

## 2018-12-12 DIAGNOSIS — M1990 Unspecified osteoarthritis, unspecified site: Secondary | ICD-10-CM | POA: Diagnosis present

## 2018-12-12 DIAGNOSIS — Z79899 Other long term (current) drug therapy: Secondary | ICD-10-CM

## 2018-12-12 DIAGNOSIS — I2510 Atherosclerotic heart disease of native coronary artery without angina pectoris: Secondary | ICD-10-CM | POA: Diagnosis present

## 2018-12-12 DIAGNOSIS — Z9841 Cataract extraction status, right eye: Secondary | ICD-10-CM

## 2018-12-12 DIAGNOSIS — Z972 Presence of dental prosthetic device (complete) (partial): Secondary | ICD-10-CM

## 2018-12-12 DIAGNOSIS — R200 Anesthesia of skin: Secondary | ICD-10-CM | POA: Diagnosis present

## 2018-12-12 DIAGNOSIS — G459 Transient cerebral ischemic attack, unspecified: Secondary | ICD-10-CM | POA: Diagnosis not present

## 2018-12-12 DIAGNOSIS — I639 Cerebral infarction, unspecified: Secondary | ICD-10-CM | POA: Diagnosis present

## 2018-12-12 DIAGNOSIS — Z951 Presence of aortocoronary bypass graft: Secondary | ICD-10-CM

## 2018-12-12 DIAGNOSIS — Z974 Presence of external hearing-aid: Secondary | ICD-10-CM

## 2018-12-12 DIAGNOSIS — Z961 Presence of intraocular lens: Secondary | ICD-10-CM | POA: Diagnosis present

## 2018-12-12 DIAGNOSIS — K219 Gastro-esophageal reflux disease without esophagitis: Secondary | ICD-10-CM | POA: Diagnosis present

## 2018-12-12 DIAGNOSIS — I252 Old myocardial infarction: Secondary | ICD-10-CM

## 2018-12-12 DIAGNOSIS — E785 Hyperlipidemia, unspecified: Secondary | ICD-10-CM | POA: Diagnosis present

## 2018-12-12 DIAGNOSIS — Z20828 Contact with and (suspected) exposure to other viral communicable diseases: Secondary | ICD-10-CM | POA: Diagnosis present

## 2018-12-12 DIAGNOSIS — K5790 Diverticulosis of intestine, part unspecified, without perforation or abscess without bleeding: Secondary | ICD-10-CM | POA: Diagnosis present

## 2018-12-12 DIAGNOSIS — R29704 NIHSS score 4: Secondary | ICD-10-CM | POA: Diagnosis present

## 2018-12-12 DIAGNOSIS — G8194 Hemiplegia, unspecified affecting left nondominant side: Secondary | ICD-10-CM | POA: Diagnosis present

## 2018-12-12 DIAGNOSIS — I509 Heart failure, unspecified: Secondary | ICD-10-CM | POA: Diagnosis present

## 2018-12-12 DIAGNOSIS — Z7982 Long term (current) use of aspirin: Secondary | ICD-10-CM

## 2018-12-12 DIAGNOSIS — Z801 Family history of malignant neoplasm of trachea, bronchus and lung: Secondary | ICD-10-CM

## 2018-12-12 DIAGNOSIS — I739 Peripheral vascular disease, unspecified: Secondary | ICD-10-CM | POA: Diagnosis present

## 2018-12-12 DIAGNOSIS — G629 Polyneuropathy, unspecified: Secondary | ICD-10-CM | POA: Diagnosis present

## 2018-12-12 DIAGNOSIS — Z8673 Personal history of transient ischemic attack (TIA), and cerebral infarction without residual deficits: Secondary | ICD-10-CM

## 2018-12-12 DIAGNOSIS — Z888 Allergy status to other drugs, medicaments and biological substances status: Secondary | ICD-10-CM

## 2018-12-12 DIAGNOSIS — Z9842 Cataract extraction status, left eye: Secondary | ICD-10-CM

## 2018-12-12 DIAGNOSIS — I429 Cardiomyopathy, unspecified: Secondary | ICD-10-CM | POA: Diagnosis present

## 2018-12-12 DIAGNOSIS — Z87891 Personal history of nicotine dependence: Secondary | ICD-10-CM

## 2018-12-12 DIAGNOSIS — I110 Hypertensive heart disease with heart failure: Secondary | ICD-10-CM | POA: Diagnosis present

## 2018-12-12 DIAGNOSIS — I6503 Occlusion and stenosis of bilateral vertebral arteries: Secondary | ICD-10-CM

## 2018-12-12 DIAGNOSIS — Z91018 Allergy to other foods: Secondary | ICD-10-CM

## 2018-12-12 HISTORY — DX: Occlusion and stenosis of bilateral vertebral arteries: I65.03

## 2018-12-12 LAB — COMPREHENSIVE METABOLIC PANEL
ALT: 44 U/L (ref 0–44)
AST: 29 U/L (ref 15–41)
Albumin: 3.9 g/dL (ref 3.5–5.0)
Alkaline Phosphatase: 79 U/L (ref 38–126)
Anion gap: 8 (ref 5–15)
BUN: 18 mg/dL (ref 8–23)
CO2: 27 mmol/L (ref 22–32)
Calcium: 9.3 mg/dL (ref 8.9–10.3)
Chloride: 103 mmol/L (ref 98–111)
Creatinine, Ser: 0.8 mg/dL (ref 0.61–1.24)
GFR calc Af Amer: >60 mL/min (ref >60)
GFR calc non Af Amer: >60 mL/min (ref >60)
Glucose, Bld: 147 mg/dL — ABNORMAL HIGH (ref 70–99)
Potassium: 3.5 mmol/L (ref 3.5–5.1)
Sodium: 138 mmol/L (ref 135–145)
Total Bilirubin: 1.8 mg/dL — ABNORMAL HIGH (ref 0.3–1.2)
Total Protein: 7.2 g/dL (ref 6.5–8.1)

## 2018-12-12 LAB — CBC
HCT: 42.1 % (ref 39.0–52.0)
Hemoglobin: 14.8 g/dL (ref 13.0–17.0)
MCH: 31.7 pg (ref 26.0–34.0)
MCHC: 35.2 g/dL (ref 30.0–36.0)
MCV: 90.1 fL (ref 80.0–100.0)
Platelets: 209 10*3/uL (ref 150–400)
RBC: 4.67 MIL/uL (ref 4.22–5.81)
RDW: 12.9 % (ref 11.5–15.5)
WBC: 8.7 10*3/uL (ref 4.0–10.5)
nRBC: 0 % (ref 0.0–0.2)

## 2018-12-12 LAB — DIFFERENTIAL
Abs Immature Granulocytes: 0.11 10*3/uL — ABNORMAL HIGH (ref 0.00–0.07)
Basophils Absolute: 0.1 10*3/uL (ref 0.0–0.1)
Basophils Relative: 1 %
Eosinophils Absolute: 0.2 10*3/uL (ref 0.0–0.5)
Eosinophils Relative: 3 %
Immature Granulocytes: 1 %
Lymphocytes Relative: 41 %
Lymphs Abs: 3.5 10*3/uL (ref 0.7–4.0)
Monocytes Absolute: 1 10*3/uL (ref 0.1–1.0)
Monocytes Relative: 11 %
Neutro Abs: 3.8 10*3/uL (ref 1.7–7.7)
Neutrophils Relative %: 43 %

## 2018-12-12 LAB — PROTIME-INR
INR: 1 (ref 0.8–1.2)
Prothrombin Time: 12.6 seconds (ref 11.4–15.2)

## 2018-12-12 LAB — TROPONIN I (HIGH SENSITIVITY): Troponin I (High Sensitivity): 10 ng/L (ref <18)

## 2018-12-12 LAB — APTT: aPTT: 27 seconds (ref 24–36)

## 2018-12-12 MED ORDER — SENNOSIDES-DOCUSATE SODIUM 8.6-50 MG PO TABS: 1.0000 | ORAL_TABLET | Freq: Every evening | ORAL | Status: DC | PRN

## 2018-12-12 MED ORDER — SODIUM CHLORIDE 0.9 % IV SOLN
INTRAVENOUS | Status: DC
  Administered 2018-12-12 – 2018-12-13 (×4): via INTRAVENOUS

## 2018-12-12 MED ORDER — CYCLOBENZAPRINE HCL 10 MG PO TABS
10.0000 mg | ORAL_TABLET | Freq: Two times a day (BID) | ORAL | Status: DC | PRN
  Administered 2018-12-12 – 2018-12-14 (×3): 10 mg via ORAL
  Filled 2018-12-12 (×3): qty 1

## 2018-12-12 MED ORDER — HEPARIN SODIUM (PORCINE) 5000 UNIT/ML IJ SOLN
5000.0000 [IU] | Freq: Three times a day (TID) | INTRAMUSCULAR | Status: DC
  Administered 2018-12-12 – 2018-12-14 (×7): 5000 [IU] via SUBCUTANEOUS
  Filled 2018-12-12 (×7): qty 1

## 2018-12-12 MED ORDER — PANTOPRAZOLE SODIUM 40 MG PO TBEC
40.0000 mg | DELAYED_RELEASE_TABLET | Freq: Every morning | ORAL | Status: DC
  Administered 2018-12-13 – 2018-12-14 (×2): 40 mg via ORAL
  Filled 2018-12-12 (×2): qty 1

## 2018-12-12 MED ORDER — ACETAMINOPHEN 650 MG RE SUPP: 650.0000 mg | RECTAL | Status: DC | PRN

## 2018-12-12 MED ORDER — ACETAMINOPHEN 160 MG/5ML PO SOLN
650.0000 mg | ORAL | Status: DC | PRN
  Filled 2018-12-12: qty 20.3

## 2018-12-12 MED ORDER — ASPIRIN EC 81 MG PO TBEC
162.0000 mg | DELAYED_RELEASE_TABLET | Freq: Every day | ORAL | Status: DC
  Administered 2018-12-12 – 2018-12-13 (×2): 162 mg via ORAL
  Filled 2018-12-12 (×2): qty 2

## 2018-12-12 MED ORDER — DICYCLOMINE HCL 10 MG PO CAPS
10.0000 mg | ORAL_CAPSULE | Freq: Two times a day (BID) | ORAL | Status: DC
  Administered 2018-12-12 – 2018-12-14 (×4): 10 mg via ORAL
  Filled 2018-12-12 (×5): qty 1

## 2018-12-12 MED ORDER — STROKE: EARLY STAGES OF RECOVERY BOOK
Freq: Once | Status: AC
  Administered 2018-12-12: 13:00:00

## 2018-12-12 MED ORDER — OXYCODONE HCL 5 MG PO TABS
10.0000 mg | ORAL_TABLET | Freq: Two times a day (BID) | ORAL | Status: DC | PRN
  Administered 2018-12-12 – 2018-12-14 (×4): 10 mg via ORAL
  Filled 2018-12-12 (×5): qty 2

## 2018-12-12 MED ORDER — GABAPENTIN 100 MG PO CAPS
100.0000 mg | ORAL_CAPSULE | Freq: Every morning | ORAL | Status: DC
  Administered 2018-12-13 – 2018-12-14 (×2): 100 mg via ORAL
  Filled 2018-12-12 (×2): qty 1

## 2018-12-12 MED ORDER — GABAPENTIN 100 MG PO CAPS
200.0000 mg | ORAL_CAPSULE | Freq: Every day | ORAL | Status: DC
  Administered 2018-12-12 – 2018-12-13 (×2): 200 mg via ORAL
  Filled 2018-12-12 (×2): qty 2

## 2018-12-12 MED ORDER — IOHEXOL 350 MG/ML SOLN
75.0000 mL | Freq: Once | INTRAVENOUS | Status: AC | PRN
  Administered 2018-12-12: 11:00:00 75 mL via INTRAVENOUS

## 2018-12-12 MED ORDER — GABAPENTIN 300 MG PO CAPS
300.0000 mg | ORAL_CAPSULE | Freq: Three times a day (TID) | ORAL | Status: DC
  Administered 2018-12-12: 16:00:00 300 mg via ORAL
  Filled 2018-12-12: qty 1

## 2018-12-12 MED ORDER — ACETAMINOPHEN 325 MG PO TABS: 650.0000 mg | ORAL_TABLET | ORAL | Status: DC | PRN

## 2018-12-12 NOTE — ED Triage Notes (Signed)
Pt with last normal time of 2000 on 12/11/18. Pt states he awoke at 0400 this am with tingling and numbness in his left arm and leg. Pt states less sensation in left arm, left side of face and left leg. Pt states shoulders have same sensation. Pt complains of weakness on standing to left leg.

## 2018-12-12 NOTE — Progress Notes (Signed)
Family Meeting Note  Advance Directive:yes  Today a meeting took place with the Patient.   The following clinical team members were present during this meeting:MD  The following were discussed:Patient's diagnosis: TIA, Htn, Hyperlipidemia , Patient's progosis: Unable to determine and Goals for treatment: Full Code  Additional follow-up to be provided: PMD  Time spent during discussion:20 minutes  Vaughan Basta, MD

## 2018-12-12 NOTE — Evaluation (Signed)
Occupational Therapy Evaluation Patient Details Name: [name] [name] MRN: [id number] DOB: 1946-04-21 Today's Date: 12/12/2018    History of Present Illness [name] [name] is an 73 y.o. male presented to the ER with  left face/left arm numbness tingling/left leg weakness. Head CT with no acute abnormalities. PMH includes prior stroke (2019),  HTN and CHF, CAD, spinal stenosis s/p surgery.   Clinical Impression   [name] [name] was seen for OT evaluation this date. Prior to hospital admission, pt was independent in all aspects of ADL. 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 Pt lives with with his wife and adult step-son in a one-level home with 3 steps to enter and bilateral handrail. Currently pt demonstrates impairments in LUE/LLE strength, coordination, and sensation. Pt complains of "pins and needles" feeling t/o his L side and states that his LLE "feels like a dead anchor weighing me down". Pt is R hand dominant and currently requires min assist for assist for seated ADLs such as dressing and bathing as well as supervision to CGA assist for transfers using a RW due to decreased AROM of his LLE and LUE. Pt requires increased time/effort for ambulation. This is a significant change from his baseline of daily physical activity, walking long distances for work, and driving. Pt is eager to return to his PLOF with increased independence and functional use of his left arm and leg. Pt educated on safe positioning of LUE in order to promote functional return, improve safety, and promote skin integrity on this date. Pt was motivated t/o OT session and return demonstrated PROM exercises with VCs for technique. No cognitive or visual deficits noted with assessment on this date. Pt would benefit from skilled OT to address noted impairments and functional limitations (see below for any additional details) in order to maximize safety and independence while minimizing falls  risk and caregiver burden.  Upon hospital discharge, recommend pt discharge to CIR for acute intensive rehabilitation in order to maximize safety and return to PLOF.     Follow Up Recommendations  CIR    Equipment Recommendations  Other (comment)(TBD)    Recommendations for Other Services Rehab consult     Precautions / Restrictions Precautions Precautions: Fall Restrictions Weight Bearing Restrictions: No      Mobility Bed Mobility Overal bed mobility: Needs Assistance Bed Mobility: Supine to Sit     Supine to sit: HOB elevated     General bed mobility comments: Increased time/effort for bed mobility.  Transfers Overall transfer level: Needs assistance Equipment used: Rolling walker (2 wheeled) Transfers: Sit to/from Stand Sit to Stand: Supervision;Min guard         General transfer comment: Pt movements slowed. Requires RW for amb.    Balance Overall balance assessment: Needs assistance Sitting-balance support: Feet supported;No upper extremity supported Sitting balance-Leahy Scale: Fair Sitting balance - Comments: Steady reaching within BOS.                                   ADL either performed or assessed with clinical judgement   ADL Overall ADL's : Needs assistance/impaired Eating/Feeding: Sitting;Independent   Grooming: Set up;Sitting   Upper Body Bathing: Set up;Minimal assistance;Sitting   Lower Body Bathing: Set up;Minimal assistance;Moderate assistance;Sit to/from stand   Upper Body Dressing : Set up;Sitting;Minimal assistance;With adaptive equipment   Lower Body Dressing: Set up;Minimal assistance;Moderate assistance;Sit to/from stand;With Development worker, international aidadaptive equipment   Toilet  Transfer: Regular Toilet;RW;Supervision/safety Toilet Transfer Details (indicate cue type and reason): Walking requires increased time/effort 2/2 LLE deficits. Toileting- Clothing Manipulation and Hygiene: Set up;Supervision/safety;Sit to/from stand        Functional mobility during ADLs: Rolling walker;Min guard;Cueing for safety       Vision Baseline Vision/History: Wears glasses Wears Glasses: At all times Patient Visual Report: No change from baseline Additional Comments: pt denies vision changes at this time. Will continue to monitor in functional context.     Perception     Praxis      Pertinent Vitals/Pain Pain Assessment: No/denies pain     Hand Dominance Right   Extremity/Trunk Assessment Upper Extremity Assessment Upper Extremity Assessment: LUE deficits/detail;Overall [facility] for tasks assessed LUE Deficits / Details: Overall WFL. Grossly 4/5 however pt endorses decreased sensation throughout LUE/LLE as well as tingling/pins and needles. LUE Coordination: decreased gross motor;decreased fine motor   Lower Extremity Assessment Lower Extremity Assessment: LLE deficits/detail;Defer to PT evaluation LLE Deficits / Details: pt states he is unable engage in active hip flexion/extension. States his entire LLE "feels like a dead anchor". Endorses decreased sensation t/o.       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   General Comments       Exercises General Exercises - Lower Extremity Ankle Circles/Pumps: 5 reps;Left;Supine;PROM(1/5 MMT: Tib anterior activation visualized; no clear gastroc/soleus activation; no active joint movement) Long Arc Quad: Supine;5 reps;Left;PROM(0/5 MMT of quads; no activation visualized; intermittent twitch of rectus femoris.) Straight Leg Raises: Left;5 reps;AAROM;Supine;Limitations Straight Leg Raises Limitations: 2/5 activation, partial lift, no quads lag, max effort required and modA required for back into  bed Hip Flexion/Marching: PROM;Left;20 reps(0/5 hip extensor activation; midl hypertonicity without spasticity on the Left hip and groin.) Other Exercises Other Exercises: pt educated on safe positioning strategies for mgt of LUE/LLE, falls prevention, and safe use of AE for ADL/mobility on this date.   Shoulder Instructions      Home Living Family/patient expects to be discharged to:: Private residence Living Arrangements: Spouse/significant other;Other (Comment)(Step-son) Available Help at Discharge: Family;Available PRN/intermittently(Wife not currently working, but did work as [employer] aid.) Type of Home: House Home Access: Stairs to enter Secretary/administrator of Steps: 3 Entrance Stairs-Rails: Right;Left;Can reach both Home Layout: One level     Bathroom Shower/Tub: Tub/shower unit;Walk-in shower;Curtain   Bathroom Toilet: Handicapped height     Home Equipment: Environmental consultant - 2 wheels;Walker - 4 wheels;Cane - single point          Prior Functioning/Environment Level of Independence: Independent        Comments: Works in Production designer, theatre/television/film for [employer]. Very active job, drives.        OT Problem List: Decreased strength;Decreased coordination;Impaired UE functional use;Impaired sensation;Decreased range of motion;Decreased activity tolerance;Decreased safety awareness;Decreased knowledge of use of DME or AE;Impaired balance (sitting and/or standing)      OT Treatment/Interventions: Self-care/ADL training;Balance training;Therapeutic exercise;Neuromuscular education;Therapeutic activities;DME and/or AE instruction;Patient/family education    OT Goals(Current goals can be found in the care plan section) Acute Rehab OT Goals Patient Stated Goal: To get back to work and PLOF OT Goal Formulation: With patient Time For Goal Achievement:  12/26/18 Potential to Achieve Goals: Good ADL Goals Pt Will Perform Upper Body Dressing: with modified independence;sitting(With LRAD PRN for  safety and improved functional independence.) Pt Will Perform Lower Body Dressing: with adaptive equipment;sit to/from stand;with modified independence(With LRAD PRN for safety and improved functional independence.) Pt Will Transfer to Toilet: with modified independence;ambulating;regular height toilet(With LRAD PRN for safety and improved functional independence.)  OT Frequency: Min 3X/week   Barriers to D/C: Inaccessible home environment;Decreased caregiver support          Co-evaluation              AM-PAC OT "6 Clicks" Daily Activity     Outcome Measure Help from another person eating meals?: None Help from another person taking care of personal grooming?: A Little Help from another person toileting, which includes using toliet, bedpan, or urinal?: A Little Help from another person bathing (including washing, rinsing, drying)?: A Little Help from another person to put on and taking off regular upper body clothing?: A Little Help from another person to put on and taking off regular lower body clothing?: A Little 6 Click Score: 19   End of Session    Activity Tolerance: Patient tolerated treatment well Patient left: in bed;with call bell/phone within reach;with bed alarm set  OT Visit Diagnosis: Other abnormalities of gait and mobility (R26.89);Hemiplegia and hemiparesis Hemiplegia - Right/Left: Left Hemiplegia - dominant/non-dominant: Non-Dominant Hemiplegia - caused by: Unspecified;Other cerebrovascular disease                Time: 1325-1350 OT Time Calculation (min): 25 min Charges:  OT General Charges $OT Visit: 1 Visit OT Evaluation $OT Eval Low Complexity: 1 Low OT Treatments $Self Care/Home Management : 8-22 mins  Shara Blazing, M.S., OTR/L Ascom: [phone] 12/12/18, 3:04 PM
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
## 2018-12-12 NOTE — ED Notes (Signed)
Pt transported to CT ?

## 2018-12-12 NOTE — ED Notes (Signed)
Dr. Brown in to speak with pt.

## 2018-12-12 NOTE — ED Notes (Signed)
Code stroke called 333 @547 
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
## 2018-12-12 NOTE — Consult Note (Signed)
Chief Complaint: left face/left arm numbness tingling/left leg weakness HPI: [name] [name] is an 73 y.o. male presents to ER with  left face/left arm numbness tingling/left leg weakness.  Patient states the he went to bed and had some pain in his left leg that he related to his line of work. At 4;00 am on dayof presentation, he woke up with left leg weakness as well as left face and left arm tingling/numbness. No LOC, no visual al disturbances, no speech disturbances, no HA.  Patient has history of stroke on June 2019 with left sided weakness. Patient received tpa and then transferred to [facility] per notes.  Patient states that stroke left with hemiparesis but he improved to the extent he could resume his work. Also, carries diagnostic of HTN and CHF, CAD, spinal stenosis s/p surgery.  Head ct with no acute abnormalities  CTA with no LVO but athersclerosis ( discussed case with radiologist on call)      Past Medical History:  Diagnosis Date  . Anginal pain (HCC)   . Arthritis   . Back pain    degenerative disc disease  . Carpal tunnel syndrome of right wrist   . CHF (congestive heart failure) (HCC)   . Coronary artery disease   . Diverticulosis   . GERD (gastroesophageal reflux disease)   . Myocardial infarction (HCC)   . Neuropathy   . Stroke (HCC)   . Wears dentures    full upper and lower  . Wears hearing aid    bilateral    Past Surgical History:  Procedure Laterality Date  . ANKLE RECONSTRUCTION Left 1991  . APPENDECTOMY    . BACK SURGERY N/A    lower back x 3  . CARDIAC CATHETERIZATION    . CARPAL TUNNEL RELEASE Left 03/2014   In Dr. Rosita Kea office  . CARPAL TUNNEL RELEASE Left 10/20/2014   Procedure: CARPAL TUNNEL RELEASE;  Surgeon: Kennedy Bucker, MD;  Location: [facility] ORS;  Service: Orthopedics;  Laterality: Left;  . CORONARY ARTERY BYPASS GRAFT  March 2014   [facility]  . ESOPHAGOGASTRODUODENOSCOPY (EGD) WITH PROPOFOL N/A 10/26/2015   Procedure:  ESOPHAGOGASTRODUODENOSCOPY (EGD) WITH PROPOFOL with dialation;  Surgeon: [name], MD;  Location: [facility];  Service: Endoscopy;  Laterality: N/A;  . EYE SURGERY Bilateral    Cataract Extraction with IOL  . LUMBAR LAMINECTOMY/DECOMPRESSION MICRODISCECTOMY Bilateral 05/06/2018   Procedure: Bilateral Lumbar Four-Five Laminectomy/Foraminotomy;  Surgeon: Barnett Abu, MD;  Location: [facility];  Service: Neurosurgery;  Laterality: Bilateral;  Bilateral Lumbar 4-5 Laminectomy/Foraminotomy  . NECK SURGERY N/A    neck x 2, one discectomy and one shaved disc.  Marland Kitchen SHOULDER ARTHROSCOPY Right 06/06/2015   Procedure: right shoulder arthroscopy, arthroscopic debridement, subacromial decompression, SLAP repair, biotenodesis, mini open rotator cuff repair;  Surgeon: Christena Flake, MD;  Location: [facility] ORS;  Service: Orthopedics;  Laterality: Right;  . SHOULDER OPEN ROTATOR CUFF REPAIR     Left    Family History  Problem Relation Age of Onset  . Lung cancer Father   . Diabetes Mellitus II Sister   . Diverticulitis Sister    Social History:  reports that he quit smoking about 5 years ago. He has never used smokeless tobacco. He reports current alcohol use. He reports that he does not use drugs.  Allergies:  Allergies  Allergen Reactions  . Parafon Forte Dsc [Chlorzoxazone] Itching and Rash    severe  . Statins Other (See Comments)    Muscle  pain  . Cucumber Extract Other (See Comments)    GI upset  . Plavix [Clopidogrel Bisulfate] Rash    (Not in a hospital admission)   ROS: C/o left leg weakness, left arm and face tingling and numbness  Physical Examination: Blood pressure (!) 104/50, pulse 67, temperature 97.8 F (36.6 C), temperature source Oral, resp. rate 14, SpO2 96 %.  HEENT-  Normocephalic, no lesions, without obvious abnormality.  Normal external eye and conjunctiva.  Normal TM's bilaterally.  Normal auditory canals and external ears. Normal external nose, mucus membranes and  septum.  Normal pharynx. Neck supple with no masses, nodes, nodules or enlargement. Cardiovascular - regular rate and rhythm, S1, S2 normal, no murmur, click, rub or gallop Lungs - chest clear, no wheezing, rales, normal symmetric air entry, Heart exam - S1, S2 normal, no murmur, no gallop, rate regular Abdomen - soft, non-tender; bowel sounds normal; no masses,  no organomegaly Extremities - no edema  Neurologic Examination: Alert, awake, oriented x4, speech appears dysrarthric but patient state that thsi is his nle speech, follows commands CN: PERLA, EOMI, right hemianopsia ( per patient this  is old due to retinal scarring??), no nystagmus, flattenning of left naso labial field, decrease sensation to touch on the left side of the face, palate and tongue midline. Motor:  5/5 on RUEX?RLEX on all modalties 5/5 on LUEX, 2/5 on LLEX sensory Decrease sensation to light touch on the left side No coordination deficit appreciated DTR/Gait not cheked at the time of examination  Results for orders placed or performed during the hospital encounter of 12/12/18 (from the past 48 hour(s))  Protime-INR     Status: None   Collection Time: 12/12/18  5:35 AM  Result Value Ref Range   Prothrombin Time 12.6 11.4 - 15.2 seconds   INR 1.0 0.8 - 1.2    Comment: (NOTE) INR goal varies based on device and disease states. Performed at [facility], [address] [place], [place] [address]   APTT     Status: None   Collection Time: 12/12/18  5:35 AM  Result Value Ref Range   aPTT 27 24 - 36 seconds    Comment: Performed at [facility], [address] [place], [place] [address]  CBC     Status: None   Collection Time: 12/12/18  5:35 AM  Result Value Ref Range   WBC 8.7 4.0 - 10.5 K/uL   RBC 4.67 4.22 - 5.81 MIL/uL   Hemoglobin 14.8 13.0 - 17.0 g/dL   HCT 42.1 39.0 - 52.0 %   MCV 90.1 80.0 - 100.0 fL   MCH 31.7 26.0 - 34.0 pg   MCHC 35.2 30.0 - 36.0 g/dL   RDW 12.9 11.5 -  15.5 %   Platelets 209 150 - 400 K/uL   nRBC 0.0 0.0 - 0.2 %    Comment: Performed at [facility], [address] [place], [place] [address]  Differential     Status: Abnormal   Collection Time: 12/12/18  5:35 AM  Result Value Ref Range   Neutrophils Relative % 43 %   Neutro Abs 3.8 1.7 - 7.7 K/uL   Lymphocytes Relative 41 %   Lymphs Abs 3.5 0.7 - 4.0 K/uL   Monocytes Relative 11 %   Monocytes Absolute 1.0 0.1 - 1.0 K/uL   Eosinophils Relative 3 %   Eosinophils Absolute 0.2 0.0 - 0.5 K/uL   Basophils Relative 1 %   Basophils Absolute 0.1 0.0 -  0.1 K/uL   Immature Granulocytes 1 %   Abs Immature Granulocytes 0.11 (H) 0.00 - 0.07 K/uL    Comment: Performed at [facility], [address] [place], [place] [address]  Comprehensive metabolic panel     Status: Abnormal   Collection Time: 12/12/18  5:35 AM  Result Value Ref Range   Sodium 138 135 - 145 mmol/L   Potassium 3.5 3.5 - 5.1 mmol/L   Chloride 103 98 - 111 mmol/L   CO2 27 22 - 32 mmol/L   Glucose, Bld 147 (H) 70 - 99 mg/dL   BUN 18 8 - 23 mg/dL   Creatinine, Ser 1.660.80 0.61 - 1.24 mg/dL   Calcium 9.3 8.9 - 06.310.3 mg/dL   Total Protein 7.2 6.5 - 8.1 g/dL   Albumin 3.9 3.5 - 5.0 g/dL   AST 29 15 - 41 U/L   ALT 44 0 - 44 U/L   Alkaline Phosphatase 79 38 - 126 U/L   Total Bilirubin 1.8 (H) 0.3 - 1.2 mg/dL   GFR calc non Af Amer >60 >60 mL/min   GFR calc Af Amer >60 >60 mL/min   Anion gap 8 5 - 15    Comment: Performed at [facility], [address] [place], [place] [address]   Ct Head Code Stroke Wo Contrast  Result Date: 12/12/2018 CLINICAL DATA:  Code stroke. Initial evaluation for left-sided numbness. EXAM: CT HEAD WITHOUT CONTRAST TECHNIQUE: Contiguous axial images were obtained from the base of the skull through the vertex without intravenous contrast. COMPARISON:  Prior CT from 05/01/2018. FINDINGS: Brain: Age-related cerebral atrophy with mild chronic small vessel ischemic disease. No  acute intracranial hemorrhage. No acute large vessel territory infarct. No mass lesion, midline shift or mass effect. No hydrocephalus. No extra-axial fluid collection. Vascular: No asymmetric hyperdense vessel. Scattered vascular calcifications noted within the carotid siphons. Skull: Scalp soft tissues and calvarium within normal limits. Sinuses/Orbits: Globes and orbital soft tissues are normal. Paranasal sinuses mastoid air cells are clear. Other: None. ASPECTS [facility]([place] Stroke Program Early CT Score) - Ganglionic level infarction (caudate, lentiform nuclei, internal capsule, insula, M1-M3 cortex): 7 - Supraganglionic infarction (M4-M6 cortex): 3 Total score (0-10 with 10 being normal): 10 IMPRESSION: 1. No acute intracranial infarct or other abnormality identified. 2. ASPECTS is 10. 3. Age-related cerebral atrophy with mild chronic small vessel ischemic disease. Critical Value/emergent results were called by telephone at the time of interpretation on 12/12/2018 at 6:09 am to Dr. Bayard MalesANDOLPH BROWN , who verbally acknowledged these results. Electronically Signed   By: Rise MuBenjamin  McClintock M.D.   On: 12/12/2018 06:10    Assessment: 73 y.o. male presents to ER with left face/arm tingling-numbness/left leg weakness. Neuro exam with left hemiparesis more on the leg. Head ct with no acute abniltes. CTA with no LVO. Not candidate for thromevctomy. New wake up stroke vs exacerbation of old stroke   Plan:   Neuro protective measures  includuing normothermia, normoglycemia, correct electrolytes/metabolicabnilites, treat any infection. Keep BP on higher side for now 140-160 1- HB1a 2. MRI of feasible 3. PT consult, OT consult, Speech consult 4. Echocardiogram 5. Carotid dopplers 6. Prophylactic therapy-Antiplatelet med: Aspirin - dose 325 7. Risk factor modification 8. Telemetry monitoring 9. Frequent neuro checks     Tomi BambergerGuerch, MD Neurology 12/12/2018, 11:33 AM

## 2018-12-12 NOTE — ED Notes (Signed)
Called and spoke with Robin on 1C, she asked about a COVID swab. RN reviewed chart, swab was not done by previous shift and no order in the computer. Message sent to Dr. Anselm Jungling asking him to put the order in.   Shirlean Mylar informed that once pt is back from CT, will swab pt for COVID and will send  To the floor. Pt was asymptomatic for COVID.

## 2018-12-12 NOTE — ED Provider Notes (Signed)
[facility] Emergency Department Provider Note ____   First MD Initiated Contact with Patient 12/12/18 0654     (approximate)  I have reviewed the triage vital signs and the nursing notes.   HISTORY  Chief Complaint Numbness    HPI [name] [name] is a 73 y.o. male with below list of previous medical conditions including CAD status post 5 vessel bypass and CVA presents to the emergency department with history of awakening at 4 AM this morning with left-sided facial numbness left arm and left leg weakness.  Patient states that he went to bed at 8:00 PM in his usual state of health.  Patient denies any headache no visual changes.  Patient denies any nausea or vomiting.  Patient does not appreciate any change in speech.      Past Medical History:  Diagnosis Date  . Anginal pain (HCC)   . Arthritis   . Back pain    degenerative disc disease  . Carpal tunnel syndrome of right wrist   . CHF (congestive heart failure) (HCC)   . Coronary artery disease   . Diverticulosis   . GERD (gastroesophageal reflux disease)   . Myocardial infarction (HCC)   . Neuropathy   . Stroke (HCC)   . Wears dentures    full upper and lower  . Wears hearing aid    bilateral    Patient Active Problem List   Diagnosis Date Noted  . TIA (transient ischemic attack) 12/12/2018  . Paresis of left lower extremity (HCC)   . Pressure injury of skin 04/30/2018  . Acute CVA (cerebrovascular accident) (HCC) 04/29/2018  . Obesity (BMI 30.0-34.9) 11/11/2017  . Stroke-like episode (HCC) - R brain, s/p tPA 11/09/2017  . PAD (peripheral artery disease) (HCC) 06/26/2016  . Pain in limb 06/04/2016  . Annual physical exam 05/31/2016  . Cerumen impaction 05/31/2016  . ERRONEOUS ENCOUNTER--DISREGARD 01/29/2016  . Difficulty in swallowing   . Hiatal hernia   . Gastritis   . Heartburn 10/11/2015  . Influenza-like illness 08/18/2015  . Sinusitis, acute 07/20/2015  . Elevation of level  of transaminase and lactic acid dehydrogenase (LDH) 05/11/2015  . Back pain with radiation 05/11/2015  . Cardiomyopathy (HCC) 05/11/2015  . Esophageal lesion 05/11/2015  . Pain in the wrist 05/11/2015  . Low back pain with sciatica 05/11/2015  . Heart attack (HCC) 05/11/2015  . Absent peripheral pulse 05/11/2015  . ERRONEOUS ENCOUNTER--DISREGARD 04/26/2015  . Back pain, chronic 02/03/2015  . Leg swelling 02/03/2015  . Edema leg 02/03/2015  . CAD in native artery 02/03/2015  . Narrowing of intervertebral disc space 02/03/2015  . Acid reflux 02/03/2015  . Hypercholesteremia 02/03/2015  . BP (high blood pressure) 02/03/2015  . Cardiomyopathy, ischemic 02/03/2015  . Arthritis of hand, degenerative 02/03/2015  . Insomnia secondary to chronic pain 11/14/2014  . 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 Diuretic-induced hypokalemia 11/14/2014  . History of decompression of median nerve 10/03/2014  . Acquired polyneuropathy 09/28/2014  . Carpal tunnel syndrome 09/28/2014  . Peripheral neuropathy 09/28/2014  . Chest pain 10/20/2013  . Other specified cardiac arrhythmias 08/23/2013  . H/O coronary artery bypass surgery 08/21/2013  . Chronic low back pain with right-sided sciatica 08/19/2013  . Difficulty hearing 08/19/2013  . Arteriosclerosis of coronary artery 08/19/2013  . Esophageal lump 08/19/2013    Past Surgical History:  Procedure Laterality Date  . ANKLE RECONSTRUCTION Left 1991  . APPENDECTOMY    . BACK SURGERY N/A  lower back x 3  . CARDIAC CATHETERIZATION    . CARPAL TUNNEL RELEASE Left 03/2014   In Dr. Rosita KeaMenz office  . CARPAL TUNNEL RELEASE Left 10/20/2014   Procedure: CARPAL TUNNEL RELEASE;  Surgeon: Kennedy BuckerMichael Menz, MD;  Location: [facility] ORS;  Service: Orthopedics;  Laterality: Left;  . CORONARY ARTERY BYPASS GRAFT  March 2014   [facility]  . ESOPHAGOGASTRODUODENOSCOPY (EGD) WITH PROPOFOL N/A 10/26/2015   Procedure: ESOPHAGOGASTRODUODENOSCOPY (EGD) WITH  PROPOFOL with dialation;  Surgeon: Midge Miniumarren Wohl, MD;  [facility];  Service: Endoscopy;  Laterality: N/A;  . EYE SURGERY Bilateral    Cataract Extraction with IOL  . LUMBAR LAMINECTOMY/DECOMPRESSION MICRODISCECTOMY Bilateral 05/06/2018   Procedure: Bilateral Lumbar Four-Five Laminectomy/Foraminotomy;  Surgeon: Barnett AbuElsner, Henry, MD;  [facility];  Service: Neurosurgery;  Laterality: Bilateral;  Bilateral Lumbar 4-5 Laminectomy/Foraminotomy  . NECK SURGERY N/A    neck x 2, one discectomy and one shaved disc.  Marland Kitchen. SHOULDER ARTHROSCOPY Right 06/06/2015   Procedure: right shoulder arthroscopy, arthroscopic debridement, subacromial decompression, SLAP repair, biotenodesis, mini open rotator cuff repair;  Surgeon: Christena FlakeJohn J Poggi, MD;  Location: [facility];  Service: Orthopedics;  Laterality: Right;  . SHOULDER OPEN ROTATOR CUFF REPAIR     Left    Prior to Admission medications   Medication Sig Start Date End Date Taking? Authorizing Provider  aspirin EC 81 MG tablet Take 162 mg by mouth daily.   Yes [provider]  cyclobenzaprine (FLEXERIL) 10 MG tablet Take 1 tablet (10 mg total) by mouth 2 (two) times daily as needed. Patient taking differently: Take 10 mg by mouth 2 (two) times a day.  07/02/17  Yes Ellyn HackShah, Syed Asad A, MD  dicyclomine (BENTYL) 10 MG capsule Take 1 capsule (10 mg total) by mouth 2 (two) times daily. 07/02/17  Yes Ellyn HackShah, Syed Asad A, MD  gabapentin (NEURONTIN) 300 MG capsule Take 1 capsule (300 mg total) by mouth 2 (two) times daily. Patient taking differently: Take 300 mg by mouth 3 (three) times daily.  11/11/17  Yes Layne BentonBiby, Sharon L, NP  Oxycodone HCl 10 MG TABS Take 1 tablet (10 mg total) by mouth every 12 (twelve) hours as needed (severe pain). 05/08/18  Yes Rai, Ripudeep K, MD  pantoprazole (PROTONIX) 40 MG tablet Take 1 tablet (40 mg total) by mouth every morning. Patient taking differently: Take 40 mg by mouth daily.  03/28/17  Yes Ellyn HackShah, Syed Asad A, MD   acetaminophen (TYLENOL) 325 MG tablet Take 2 tablets (650 mg total) by mouth every 4 (four) hours as needed for mild pain (or temp > 37.5 C (99.5 F)). Patient not taking: Reported on 12/12/2018 05/08/18   Rai, Delene Ruffiniipudeep K, MD  docusate sodium (COLACE) 100 MG capsule Take 1 capsule (100 mg total) by mouth 2 (two) times daily. Patient not taking: Reported on 12/12/2018 05/08/18   Cathren Harshai, Ripudeep K, MD    Allergies Parafon forte dsc [chlorzoxazone], Statins, Cucumber extract, and Plavix [clopidogrel bisulfate]  Family History  Problem Relation Age of Onset  . Lung cancer Father   . Diabetes Mellitus II Sister   . Diverticulitis Sister     Social History Social History   Tobacco Use  . Smoking status: Former Smoker    Quit date: 06/18/2013    Years since quitting: 5.4  . Smokeless tobacco: Never Used  Substance Use Topics  . Alcohol use: Yes    Alcohol/week: 0.0 - 1.0 standard drinks    Comment: occasionally  . Drug use:  No    Review of Systems Constitutional: No fever/chills Eyes: No visual changes. ENT: No sore throat. Cardiovascular: Denies chest pain. Respiratory: Denies shortness of breath. Gastrointestinal: No abdominal pain.  No nausea, no vomiting.  No diarrhea.  No constipation. Genitourinary: Negative for dysuria. Musculoskeletal: Negative for neck pain.  Negative for back pain. Integumentary: Negative for rash. Neurological: Negative for headaches, positive for left face numbness left arm and left leg weakness.   ____________________________________________   PHYSICAL EXAM:  VITAL SIGNS: ED Triage Vitals [12/12/18 0526]  Enc Vitals Group     BP 128/81     Pulse Rate 61     Resp 16     Temp 97.8 F (36.6 C)     Temp Source Oral     SpO2 98 %     Weight      Height      Head Circumference      Peak Flow      Pain Score 4     Pain Loc      Pain Edu?      Excl. in [place]?     Constitutional: Alert and oriented. Well appearing and in no acute  distress. Eyes: Conjunctivae are normal. PERRL. EOMI. Mouth/Throat: Mucous membranes are moist.  Oropharynx non-erythematous. Neck: No stridor.  Cardiovascular: Normal rate, regular rhythm. Good peripheral circulation. Grossly normal heart sounds. Respiratory: Normal respiratory effort.  No retractions. No audible wheezing. Gastrointestinal: Soft and nontender. No distention.  Musculoskeletal: No lower extremity tenderness nor edema. No gross deformities of extremities. Neurologic:  Normal speech and language. No gross focal neurologic deficits are appreciated.  Skin:  Skin is warm, dry and intact. No rash noted. Psychiatric: Mood and affect are normal. Speech and behavior are normal.  ____________________________________________   LABS (all labs ordered are listed, but only abnormal results are displayed)  Labs Reviewed  DIFFERENTIAL - Abnormal; Notable for the following components:      Result Value   Abs Immature Granulocytes 0.11 (*)    All other components within normal limits  COMPREHENSIVE METABOLIC PANEL - Abnormal; Notable for the following components:   Glucose, Bld 147 (*)    Total Bilirubin 1.8 (*)    All other components within normal limits  PROTIME-INR  APTT  CBC  URINE DRUG SCREEN, QUALITATIVE ([facility] ONLY)  URINALYSIS, ROUTINE W REFLEX MICROSCOPIC  I-STAT CREATININE, ED  TROPONIN I (HIGH SENSITIVITY)   ____________________________________________  EKG  ED ECG REPORT I, Reydon N Saryiah Bencosme, the attending physician, personally viewed and interpreted this ECG.   Date: 12/12/2018  EKG Time: 5:24 AM  Rate: 60  Rhythm: Normal sinus rhythm  Axis: Normal  intervals: Normal  ST&T Change: None  ____________________________________________  RADIOLOGY I, Pillager N Haden Suder, personally viewed and evaluated these images (plain radiographs) as part of my medical decision making, as well as reviewing the written report by the radiologist.  ED MD interpretation: No  acute intracranial infarct or any other abnormality noted on CT head  Official radiology report(s): Ct Head Code Stroke Wo Contrast  Result Date: 12/12/2018 CLINICAL DATA:  Code stroke. Initial evaluation for left-sided numbness. EXAM: CT HEAD WITHOUT CONTRAST TECHNIQUE: Contiguous axial images were obtained from the base of the skull through the vertex without intravenous contrast. COMPARISON:  Prior CT from 05/01/2018. FINDINGS: Brain: Age-related cerebral atrophy with mild chronic small vessel ischemic disease. No acute intracranial hemorrhage. No acute large vessel territory infarct. No mass lesion, midline shift or mass effect. No hydrocephalus. No extra-axial fluid  collection. Vascular: No asymmetric hyperdense vessel. Scattered vascular calcifications noted within the carotid siphons. Skull: Scalp soft tissues and calvarium within normal limits. Sinuses/Orbits: Globes and orbital soft tissues are normal. Paranasal sinuses mastoid air cells are clear. Other: None. ASPECTS [facility] Stroke Program Early CT Score) - Ganglionic level infarction (caudate, lentiform nuclei, internal capsule, insula, M1-M3 cortex): 7 - Supraganglionic infarction (M4-M6 cortex): 3 Total score (0-10 with 10 being normal): 10 IMPRESSION: 1. No acute intracranial infarct or other abnormality identified. 2. ASPECTS is 10. 3. Age-related cerebral atrophy with mild chronic small vessel ischemic disease. Critical Value/emergent results were called by telephone at the time of interpretation on 12/12/2018 at 6:09 am to Dr. Bayard Males , who verbally acknowledged these results. Electronically Signed   By: Rise Mu M.D.   On: 12/12/2018 06:10      Procedures   ____________________________________________   INITIAL IMPRESSION / MDM / ASSESSMENT AND PLAN / ED COURSE  As part of my medical decision making, I reviewed the following data within the electronic MEDICAL RECORD NUMBER 73 year old male presenting with  above-stated history and physical exam concerning for CVA.  Stroke protocol initiated CT head revealed no acute intracranial abnormality.  Patient discussed with Dr. Sheryle Hail for hospital admission further evaluation and management of possible CVA. ____________________________________________  FINAL CLINICAL IMPRESSION(S) / ED DIAGNOSES  Final diagnoses:  Cerebrovascular accident (CVA), unspecified mechanism (HCC)     MEDICATIONS GIVEN DURING THIS VISIT:  Medications - No data to display   ED Discharge Orders    None      *Please note:  [name] [name] was evaluated in Emergency Department on 12/12/2018 for the symptoms described in the history of present illness. He was evaluated in the context of the global COVID-19 pandemic, which necessitated consideration that the patient might be at risk for infection with the SARS-CoV-2 virus that causes COVID-19. Institutional protocols and algorithms that pertain to the evaluation of patients at risk for COVID-19 are in a state of rapid change based on information released by regulatory bodies including the CDC and federal and state organizations. These policies and algorithms were followed during the patient's care in the ED.  Some ED evaluations and interventions may be delayed as a result of limited staffing during the pandemic.*  Note:  This document was prepared using Dragon voice recognition software and may include unintentional dictation errors.   Darci Current, MD 12/12/18 [phone]
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
## 2018-12-12 NOTE — ED Notes (Signed)
Pt given meal tray.

## 2018-12-12 NOTE — ED Notes (Signed)
Patient transported to CT 

## 2018-12-12 NOTE — ED Notes (Signed)
ED TO INPATIENT HANDOFF REPORT  ED Nurse Name and Phone #:  Broxton Broady, RN [phone]  S Name/Age/Gender [name] [name] 73 y.o. male Room/Bed: ED09A/ED09A  Code Status   Code Status: Full Code  Home/SNF/Other Home Patient oriented to: self, place, time and situation Is this baseline? Yes   Triage Complete: [facility] complete  Chief Complaint Numbness  Triage Note Pt with last normal time of 2000 on 12/11/18. Pt states he awoke at 0400 this am with tingling and numbness in his left arm and leg. Pt states less sensation in left arm, left side of face and left leg. Pt states shoulders have same sensation. Pt complains of weakness on standing to left leg.    Allergies Allergies  Allergen Reactions  . Parafon Forte Dsc [Chlorzoxazone] Itching and Rash    severe  . Statins Other (See Comments)    Muscle pain  . Cucumber Extract Other (See Comments)    GI upset  . Plavix [Clopidogrel Bisulfate] Rash    Level of Care/Admitting Diagnosis ED Disposition    ED Disposition Condition Comment   Admit  [facility]ospital [facility] [[id number]]  Level of Care: Med-Surg [16]  Covid Evaluation: Asymptomatic Screening Protocol (No Symptoms)  Diagnosis: TIA (transient ischemic attack) [295621]) [167614]  Admitting Physician: Arnaldo NatalIAMOND, MICHAEL S [[id number]]  Attending Physician: Arnaldo NatalIAMOND, MICHAEL S [phone][[id number]]  PT Class (Do Not Modify): Observation [104]  PT Acc Code (Do Not Modify): Observation [10022]       B Medical/Surgery History Past Medical History:  Diagnosis Date  . Anginal pain (HCC)   . Arthritis   . Back pain    degenerative disc disease  . Carpal tunnel syndrome of right wrist   . CHF (congestive heart failure) (HCC)   . Coronary artery disease   . Diverticulosis   . GERD (gastroesophageal reflux disease)   . Myocardial infarction (HCC)   . Neuropathy   . Stroke (HCC)   . Wears dentures    full upper and lower  . Wears hearing aid    bilateral   Past  Surgical History:  Procedure Laterality Date  . ANKLE RECONSTRUCTION Left 1991  . APPENDECTOMY    . BACK SURGERY N/A    lower back x 3  . CARDIAC CATHETERIZATION    . CARPAL TUNNEL RELEASE Left 03/2014   In Dr. Rosita KeaMenz office  . CARPAL TUNNEL RELEASE Left 10/20/2014   Procedure: CARPAL TUNNEL RELEASE;  Surgeon: Kennedy BuckerMichael Menz, MD;  Location: [facility] ORS;  Service: Orthopedics;  Laterality: Left;  . CORONARY ARTERY BYPASS GRAFT  March 2014   [facility]  . ESOPHAGOGASTRODUODENOSCOPY (EGD) WITH PROPOFOL N/A 10/26/2015   Procedure: ESOPHAGOGASTRODUODENOSCOPY (EGD) WITH PROPOFOL with dialation;  Surgeon: Midge Miniumarren Wohl, MD;  Location[facility];  Service: Endoscopy;  Laterality: N/A;  . EYE SURGERY Bilateral    Cataract Extraction with IOL  . LUMBAR LAMINECTOMY/DECOMPRESSION MICRODISCECTOMY Bilateral 05/06/2018   Procedure: Bilateral Lumbar Four-Five Laminectomy/Foraminotomy;  Surgeon: Barnett AbuElsner, Henry, MD;  [facility];  Service: Neurosurgery;  Laterality: Bilateral;  Bilateral Lumbar 4-5 Laminectomy/Foraminotomy  . NECK SURGERY N/A    neck x 2, one discectomy and one shaved disc.  Marland Kitchen. SHOULDER ARTHROSCOPY Right 06/06/2015   Procedure: right shoulder arthroscopy, arthroscopic debridement, subacromial decompression, SLAP repair, biotenodesis, mini open rotator cuff repair;  Surgeon: Christena FlakeJohn J Poggi, MD;  Location: [facility] OR[facility];  Service: Orthopedics;  Laterality: Right;  . SHOULDER OPEN ROTATOR CUFF REPAIR     Left     A  IV Location/Drains/Wounds Patient Lines/Drains/Airways Status   Active Line/Drains/Airways    Name:   Placement date:   Placement time:   Site:   Days:   Peripheral IV 11/09/17 Right Hand   11/09/17    1753    Hand   398   Peripheral IV 12/12/18 Right Antecubital   12/12/18    0526    Antecubital   less than 1   Incision (Closed) 10/20/14 Hand Left   10/20/14    1137     1514   Incision (Closed) 06/06/15 Shoulder Right   06/06/15    1334     1285   Incision (Closed)  10/26/15 Lip Other (Comment)   10/26/15    1059     1143   Incision (Closed) 05/06/18 Back Other (Comment)   05/06/18    1845     220   Pressure Injury 04/30/18 Stage II -  Partial thickness loss of dermis presenting as a shallow open ulcer with a red, pink wound bed without slough.   04/30/18    0000     226          Intake/Output Last 24 hours No intake or output data in the 24 hours ending 12/12/18 1030  Labs/Imaging Results for orders placed or performed during the hospital encounter of 12/12/18 (from the past 48 hour(s))  Protime-INR     Status: None   Collection Time: 12/12/18  5:35 AM  Result Value Ref Range   Prothrombin Time 12.6 11.4 - 15.2 seconds   INR 1.0 0.8 - 1.2    Comment: (NOTE) INR goal varies based on device and disease states. Performed at [facility], [address] [place], [place] [address]   APTT     Status: None   Collection Time: 12/12/18  5:35 AM  Result Value Ref Range   aPTT 27 24 - 36 seconds    Comment: Performed at [facility], [address] [place], [place] [address]  CBC     Status: None   Collection Time: 12/12/18  5:35 AM  Result Value Ref Range   WBC 8.7 4.0 - 10.5 K/uL   RBC 4.67 4.22 - 5.81 MIL/uL   Hemoglobin 14.8 13.0 - 17.0 g/dL   HCT 09.842.1 11.939.0 - 14.752.0 %   MCV 90.1 80.0 - 100.0 fL   MCH 31.7 26.0 - 34.0 pg   MCHC 35.2 30.0 - 36.0 g/dL   RDW 82.912.9 56.211.5 - 13.015.5 %   Platelets 209 150 - 400 K/uL   nRBC 0.0 0.0 - 0.2 %    Comment: Performed at [facility], [address] [place], [place] [address]  Differential     Status: Abnormal   Collection Time: 12/12/18  5:35 AM  Result Value Ref Range   Neutrophils Relative % 43 %   Neutro Abs 3.8 1.7 - 7.7 K/uL   Lymphocytes Relative 41 %   Lymphs Abs 3.5 0.7 - 4.0 K/uL   Monocytes Relative 11 %   Monocytes Absolute 1.0 0.1 - 1.0 K/uL   Eosinophils Relative 3 %   Eosinophils Absolute 0.2 0.0 - 0.5 K/uL   Basophils Relative 1 %   Basophils Absolute 0.1  0.0 - 0.1 K/uL   Immature Granulocytes 1 %   Abs Immature Granulocytes 0.11 (H) 0.00 - 0.07 K/uL    Comment: Performed at [facility], [address] [place], [place] [address]  Comprehensive metabolic panel     Status:  Abnormal   Collection Time: 12/12/18  5:35 AM  Result Value Ref Range   Sodium 138 135 - 145 mmol/L   Potassium 3.5 3.5 - 5.1 mmol/L   Chloride 103 98 - 111 mmol/L   CO2 27 22 - 32 mmol/L   Glucose, Bld 147 (H) 70 - 99 mg/dL   BUN 18 8 - 23 mg/dL   Creatinine, Ser 0.80 0.61 - 1.24 mg/dL   Calcium 9.3 8.9 - 10.3 mg/dL   Total Protein 7.2 6.5 - 8.1 g/dL   Albumin 3.9 3.5 - 5.0 g/dL   AST 29 15 - 41 U/L   ALT 44 0 - 44 U/L   Alkaline Phosphatase 79 38 - 126 U/L   Total Bilirubin 1.8 (H) 0.3 - 1.2 mg/dL   GFR calc non Af Amer >60 >60 mL/min   GFR calc Af Amer >60 >60 mL/min   Anion gap 8 5 - 15    Comment: Performed at [facility], [address], [place], [place] [address]   Ct Head Code Stroke Wo Contrast  Result Date: 12/12/2018 CLINICAL DATA:  Code stroke. Initial evaluation for left-sided numbness. EXAM: CT HEAD WITHOUT CONTRAST TECHNIQUE: Contiguous axial images were obtained from the base of the skull through the vertex without intravenous contrast. COMPARISON:  Prior CT from 05/01/2018. FINDINGS: Brain: Age-related cerebral atrophy with mild chronic small vessel ischemic disease. No acute intracranial hemorrhage. No acute large vessel territory infarct. No mass lesion, midline shift or mass effect. No hydrocephalus. No extra-axial fluid collection. Vascular: No asymmetric hyperdense vessel. Scattered vascular calcifications noted within the carotid siphons. Skull: Scalp soft tissues and calvarium within normal limits. Sinuses/Orbits: Globes and orbital soft tissues are normal. Paranasal sinuses mastoid air cells are clear. Other: None. ASPECTS [facility] Stroke Program Early CT Score) - Ganglionic level infarction (caudate, lentiform nuclei,  internal capsule, insula, M1-M3 cortex): 7 - Supraganglionic infarction (M4-M6 cortex): 3 Total score (0-10 with 10 being normal): 10 IMPRESSION: 1. No acute intracranial infarct or other abnormality identified. 2. ASPECTS is 10. 3. Age-related cerebral atrophy with mild chronic small vessel ischemic disease. Critical Value/emergent results were called by telephone at the time of interpretation on 12/12/2018 at 6:09 am to Dr. Marjean Donna , who verbally acknowledged these results. Electronically Signed   By: Jeannine Boga M.D.   On: 12/12/2018 06:10    Pending Labs Unresulted Labs (From admission, onward)    Start     Ordered   12/13/18 0500  Hemoglobin A1c  Tomorrow morning,   STAT     12/12/18 1021   12/13/18 0500  Lipid panel  Tomorrow morning,   STAT    Comments: Fasting    12/12/18 1021   12/12/18 0535  Urine Drug Screen, Qualitative  Once,   STAT     12/12/18 0535   12/12/18 0535  Urinalysis, Routine w reflex microscopic  ONCE - STAT,   STAT     12/12/18 0535          Vitals/Pain Today's Vitals   12/12/18 0730 12/12/18 0800 12/12/18 0830 12/12/18 0900  BP:  124/69 119/66 123/71  Pulse:  (!) 59 60 (!) 57  Resp:  13 16 11   Temp:      TempSrc:      SpO2:  100% 98% 97%  PainSc: 6        Isolation Precautions No active isolations  Medications Medications  pantoprazole (PROTONIX) EC tablet 40 mg (has no administration in time range)  gabapentin (  NEURONTIN) capsule 300 mg (has no administration in time range)  dicyclomine (BENTYL) capsule 10 mg (has no administration in time range)  aspirin EC tablet 162 mg (has no administration in time range)  oxyCODONE (Oxy IR/ROXICODONE) immediate release tablet 10 mg (10 mg Oral Given 12/12/18 0833)   stroke: mapping our early stages of recovery book (has no administration in time range)  0.9 %  sodium chloride infusion (has no administration in time range)  acetaminophen (TYLENOL) tablet 650 mg (has no administration in time  range)    Or  acetaminophen (TYLENOL) solution 650 mg (has no administration in time range)    Or  acetaminophen (TYLENOL) suppository 650 mg (has no administration in time range)  senna-docusate (Senokot-S) tablet 1 tablet (has no administration in time range)  heparin injection 5,000 Units (has no administration in time range)  iohexol (OMNIPAQUE) 350 MG/ML injection 75 mL (has no administration in time range)    Mobility walks with person assist Moderate fall risk   Focused Assessments Neuro Assessment Handoff:  Swallow screen pass? Yes  Cardiac Rhythm: Other (Comment)(bradycardia) NIH Stroke Scale ( + Modified Stroke Scale Criteria)  Interval: Initial Level of Consciousness (1a.)   : Alert, keenly responsive LOC Questions (1b. )   +: Answers both questions correctly LOC Commands (1c. )   + : Performs both tasks correctly Best Gaze (2. )  +: Normal Visual (3. )  +: No visual loss Facial Palsy (4. )    : Minor paralysis Motor Arm, Left (5a. )   +: No drift Motor Arm, Right (5b. )   +: No drift Motor Leg, Left (6a. )   +: No effort against gravity Motor Leg, Right (6b. )   +: No drift Limb Ataxia (7. ): Present in one limb Sensory (8. )   +: Mild-to-moderate sensory loss, patient feels pinprick is less sharp or is dull on the affected side, or there is a loss of superficial pain with pinprick, but patient is aware of being touched Best Language (9. )   +: No aphasia Dysarthria (10. ): Normal Extinction/Inattention (11.)   +: No Abnormality Modified SS Total  +: 4 Complete NIHSS TOTAL: 4     Neuro Assessment:   Neuro Checks:   Initial (12/12/18 0530)  Last Documented NIHSS Modified Score: 4 (12/12/18 0935) Has TPA been given? No If patient is a Neuro Trauma and patient is going to OR before floor call report to 4N Charge nurse: [phone] or [phone]     R Recommendations: See Admitting Provider Note  Report given to:   Additional Notes:

## 2018-12-12 NOTE — ED Notes (Signed)
Neurologist at bedside. 

## 2018-12-12 NOTE — ED Notes (Signed)
md at bedside

## 2018-12-12 NOTE — ED Notes (Signed)
Report to angela, rn. 

## 2018-12-12 NOTE — Progress Notes (Signed)
Neuro Checks q 2hr remain unchanged since admission assessement at 1212.

## 2018-12-12 NOTE — Progress Notes (Signed)
CODE STROKE- PHARMACY COMMUNICATION   Time CODE STROKE called/page received:@ [phone]  Time response to CODE STROKE was made (in person or via phone): @ [phone]   Time Stroke Kit retrieved from [facility] (only if needed):NA  Name of Provider/Nurse contacted: April,RN  Past Medical History:  Diagnosis Date  . Anginal pain ([place])   . Arthritis   . Back pain    degenerative disc disease  . Carpal tunnel syndrome of right wrist   . CHF (congestive heart failure) ([place])   . Coronary artery disease   . Diverticulosis   . GERD (gastroesophageal reflux disease)   . Myocardial infarction ([place])   . Neuropathy   . Stroke ([place])   . Wears dentures    full upper and lower  . Wears hearing aid    bilateral   Prior to Admission medications   Medication Sig Start Date End Date Taking? Authorizing Provider  aspirin EC 81 MG tablet Take 162 mg by mouth daily.   Yes [provider]  cyclobenzaprine (FLEXERIL) 10 MG tablet Take 1 tablet (10 mg total) by mouth 2 (two) times daily as needed. Patient taking differently: Take 10 mg by mouth 2 (two) times a day.  07/02/17  Yes Roselee Nova, MD  dicyclomine (BENTYL) 10 MG capsule Take 1 capsule (10 mg total) by mouth 2 (two) times daily. 07/02/17  Yes Roselee Nova, MD  gabapentin (NEURONTIN) 300 MG capsule Take 1 capsule (300 mg total) by mouth 2 (two) times daily. Patient taking differently: Take 300 mg by mouth 3 (three) times daily.  11/11/17  Yes Donzetta Starch, NP  Oxycodone HCl 10 MG TABS Take 1 tablet (10 mg total) by mouth every 12 (twelve) hours as needed (severe pain). 05/08/18  Yes Rai, Ripudeep K, MD  pantoprazole (PROTONIX) 40 MG tablet Take 1 tablet (40 mg total) by mouth every morning. Patient taking differently: Take 40 mg by mouth daily.  03/28/17  Yes Roselee Nova, MD  acetaminophen (TYLENOL) 325 MG tablet Take 2 tablets (650 mg total) by mouth every 4 (four) hours as needed for mild pain (or temp > 37.5 C (99.5 F)). Patient  not taking: Reported on 12/12/2018 05/08/18   Rai, Vernelle Emerald, MD  docusate sodium (COLACE) 100 MG capsule Take 1 capsule (100 mg total) by mouth 2 (two) times daily. Patient not taking: Reported on 12/12/2018 05/08/18   Rai, Vernelle Emerald, MD  isosorbide mononitrate (IMDUR) 30 MG 24 hr tablet Take 30 mg by mouth daily.  05/04/16 12/12/18  [provider]    Pernell Dupre ,PharmD Clinical Pharmacist  12/12/2018  6:55 AM

## 2018-12-13 ENCOUNTER — Observation Stay
Admit: 2018-12-13 | Discharge: 2018-12-13 | Disposition: A | Discharge status: Home / Self Care | Payer: Medicare Other | Attending: Cardiovascular Disease | Admitting: Cardiovascular Disease

## 2018-12-13 ENCOUNTER — Observation Stay: Payer: Medicare Other

## 2018-12-13 ENCOUNTER — Observation Stay (HOSPITAL_BASED_OUTPATIENT_CLINIC_OR_DEPARTMENT_OTHER)
Admit: 2018-12-13 | Discharge: 2018-12-13 | Disposition: A | Discharge status: Home / Self Care | Payer: Medicare Other | Attending: Internal Medicine | Admitting: Internal Medicine

## 2018-12-13 DIAGNOSIS — G459 Transient cerebral ischemic attack, unspecified: Secondary | ICD-10-CM | POA: Diagnosis not present

## 2018-12-13 LAB — GLUCOSE, CAPILLARY
Glucose-Capillary: 189 mg/dL — ABNORMAL HIGH (ref 70–99)
Glucose-Capillary: 206 mg/dL — ABNORMAL HIGH (ref 70–99)

## 2018-12-13 LAB — LIPID PANEL
Cholesterol: 148 mg/dL (ref 0–200)
HDL: 30 mg/dL — ABNORMAL LOW (ref >40)
LDL Cholesterol: 98 mg/dL (ref 0–99)
Total CHOL/HDL Ratio: 4.9 RATIO
Triglycerides: 100 mg/dL (ref <150)
VLDL: 20 mg/dL (ref 0–40)

## 2018-12-13 LAB — HEMOGLOBIN A1C
Hgb A1c MFr Bld: 6.9 % — ABNORMAL HIGH (ref 4.8–5.6)
Mean Plasma Glucose: 151.33 mg/dL

## 2018-12-13 MED ORDER — ASPIRIN 325 MG PO TABS
325.0000 mg | ORAL_TABLET | Freq: Every day | ORAL | Status: DC
  Filled 2018-12-13: qty 1

## 2018-12-13 MED ORDER — ASPIRIN EC 81 MG PO TBEC
162.0000 mg | DELAYED_RELEASE_TABLET | Freq: Once | ORAL | Status: AC
  Administered 2018-12-13: 162 mg via ORAL
  Filled 2018-12-13: qty 2

## 2018-12-13 MED ORDER — EZETIMIBE 10 MG PO TABS
10.0000 mg | ORAL_TABLET | Freq: Every day | ORAL | Status: DC
  Administered 2018-12-13 – 2018-12-14 (×2): 10 mg via ORAL
  Filled 2018-12-13 (×2): qty 1

## 2018-12-13 MED ORDER — ASPIRIN EC 325 MG PO TBEC
325.0000 mg | DELAYED_RELEASE_TABLET | Freq: Every day | ORAL | Status: DC
  Administered 2018-12-14: 325 mg via ORAL
  Filled 2018-12-13: qty 1

## 2018-12-13 NOTE — Plan of Care (Signed)
  Problem: Education: Goal: Knowledge of disease or condition will improve Outcome: Progressing Goal: Knowledge of secondary prevention will improve Outcome: Progressing Goal: Knowledge of patient specific risk factors addressed and post discharge goals established will improve Outcome: Progressing   Problem: Coping: Goal: Will verbalize positive feelings about self Outcome: Progressing Goal: Will identify appropriate support needs Outcome: Progressing   Problem: Health Behavior/Discharge Planning: Goal: Ability to manage health-related needs will improve Outcome: Progressing   Problem: Self-Care: Goal: Ability to participate in self-care as condition permits will improve Outcome: Progressing   Problem: Ischemic Stroke/TIA Tissue Perfusion: Goal: Complications of ischemic stroke/TIA will be minimized Outcome: Progressing

## 2018-12-13 NOTE — Care Management Obs Status (Signed)
[facility] NOTIFICATION   Patient Details  Name: DAJOUR PIERPOINT MRN: [id number] Date of Birth: Aug 31, 1945   Medicare Observation Status Notification Given:  Yes    Mical Kicklighter A Exie Chrismer, RN 12/13/2018, 8:19 AM

## 2018-12-13 NOTE — Progress Notes (Signed)
*  PRELIMINARY RESULTS* Echocardiogram 2D Echocardiogram has been performed.  Sherrie Sport 12/13/2018, 2:22 PM

## 2018-12-13 NOTE — Progress Notes (Signed)
*  PRELIMINARY RESULTS* Echocardiogram 2D Echocardiogram has been performed.  Sherrie Sport 12/13/2018, 2:23 PM

## 2018-12-13 NOTE — Consult Note (Signed)
07/12:  - No major event overnight. Resting comfortably - Neurologic Examination: Alert, awake, oriented x4, speech nle follows commands CN: PERLA, EOMI, right hemianopsia ( per patient this  is old due to retinal scarring??), no nystagmus, flattenning of left naso labial field, decrease sensation to touch on the left side of the face, palate and tongue midline. Motor:  5/5 on RUEX/RLEX on all modalties 5/5 on LUEX, 2/5 on LLEX sensory Decrease sensation to light touch on the left side No coordination deficit appreciated DTR/Gait not cheked at the time of examination  - Echo from 11/2017:  There is akinesis of all of the apical segments with overall  preserved LVEF 55-60%. There is a small thrombus in the LV apex   measuring 10 x 10 mm. Relayed the findings to primary team     Chief Complaint: left face/left arm numbness tingling/left leg weakness HPI: [name] [name] is an 73 y.o. male presents to ER with  left face/left arm numbness tingling/left leg weakness.  Patient states the he went to bed and had some pain in his left leg that he related to his line of work. At 4;00 am on dayof presentation, he woke up with left leg weakness as well as left face and left arm tingling/numbness. No LOC, no visual al disturbances, no speech disturbances, no HA.  Patient has history of stroke on June 2019 with left sided weakness. Patient received tpa and then transferred to [facility] per notes.  Patient states that stroke left with hemiparesis but he improved to the extent he could resume his work. Also, carries diagnostic of HTN and CHF, CAD, spinal stenosis s/p surgery.  Head ct with no acute abnormalities  CTA with no LVO but athersclerosis ( discussed case with radiologist on call)      Past Medical History:  Diagnosis Date  . Anginal pain (HCC)   . Arthritis   . Back pain    degenerative disc disease  . Carpal tunnel syndrome of right wrist   . CHF (congestive heart failure) (HCC)    . Coronary artery disease   . Diverticulosis   . GERD (gastroesophageal reflux disease)   . Myocardial infarction (HCC)   . Neuropathy   . Stroke (HCC)   . Wears dentures    full upper and lower  . Wears hearing aid    bilateral    Past Surgical History:  Procedure Laterality Date  . ANKLE RECONSTRUCTION Left 1991  . APPENDECTOMY    . BACK SURGERY N/A    lower back x 3  . CARDIAC CATHETERIZATION    . CARPAL TUNNEL RELEASE Left 03/2014   In Dr. Rosita Kea office  . CARPAL TUNNEL RELEASE Left 10/20/2014   Procedure: CARPAL TUNNEL RELEASE;  Surgeon: Kennedy Bucker, MD;  Location: [facility] ORS;  Service: Orthopedics;  Laterality: Left;  . CORONARY ARTERY BYPASS GRAFT  March 2014   [facility]  . ESOPHAGOGASTRODUODENOSCOPY (EGD) WITH PROPOFOL N/A 10/26/2015   Procedure: ESOPHAGOGASTRODUODENOSCOPY (EGD) WITH PROPOFOL with dialation;  Surgeon: [name], MD;  Location: [facility];  Service: Endoscopy;  Laterality: N/A;  . EYE SURGERY Bilateral    Cataract Extraction with IOL  . LUMBAR LAMINECTOMY/DECOMPRESSION MICRODISCECTOMY Bilateral 05/06/2018   Procedure: Bilateral Lumbar Four-Five Laminectomy/Foraminotomy;  Surgeon: Barnett Abu, MD;  Location: [facility];  Service: Neurosurgery;  Laterality: Bilateral;  Bilateral Lumbar 4-5 Laminectomy/Foraminotomy  . NECK SURGERY N/A    neck x 2, one discectomy and one shaved disc.  Marland Kitchen SHOULDER ARTHROSCOPY Right  06/06/2015   Procedure: right shoulder arthroscopy, arthroscopic debridement, subacromial decompression, SLAP repair, biotenodesis, mini open rotator cuff repair;  Surgeon: Christena Flake, MD;  Location: [facility] ORS;  Service: Orthopedics;  Laterality: Right;  . SHOULDER OPEN ROTATOR CUFF REPAIR     Left    Family History  Problem Relation Age of Onset  . Lung cancer Father   . Diabetes Mellitus II Sister   . Diverticulitis Sister    Social History:  reports that he quit smoking about 5 years ago. He has never used smokeless tobacco. He  reports current alcohol use. He reports that he does not use drugs.  Allergies:  Allergies  Allergen Reactions  . Parafon Forte Dsc [Chlorzoxazone] Itching and Rash    severe  . Statins Other (See Comments)    Muscle pain  . Cucumber Extract Other (See Comments)    GI upset  . Plavix [Clopidogrel Bisulfate] Rash    Medications Prior to Admission  Medication Sig Dispense Refill  . aspirin EC 81 MG tablet Take 162 mg by mouth daily.    . cyclobenzaprine (FLEXERIL) 10 MG tablet Take 1 tablet (10 mg total) by mouth 2 (two) times daily as needed. (Patient taking differently: Take 10 mg by mouth 2 (two) times a day. ) 180 tablet 0  . dicyclomine (BENTYL) 10 MG capsule Take 1 capsule (10 mg total) by mouth 2 (two) times daily. 180 capsule 0  . gabapentin (NEURONTIN) 300 MG capsule Take 1 capsule (300 mg total) by mouth 2 (two) times daily. (Patient taking differently: Take 300 mg by mouth 3 (three) times daily. )    . Oxycodone HCl 10 MG TABS Take 1 tablet (10 mg total) by mouth every 12 (twelve) hours as needed (severe pain). 15 tablet 0  . pantoprazole (PROTONIX) 40 MG tablet Take 1 tablet (40 mg total) by mouth every morning. (Patient taking differently: Take 40 mg by mouth daily. ) 90 tablet 1  . acetaminophen (TYLENOL) 325 MG tablet Take 2 tablets (650 mg total) by mouth every 4 (four) hours as needed for mild pain (or temp > 37.5 C (99.5 F)). (Patient not taking: Reported on 12/12/2018) 30 tablet 0  . docusate sodium (COLACE) 100 MG capsule Take 1 capsule (100 mg total) by mouth 2 (two) times daily. (Patient not taking: Reported on 12/12/2018) 60 capsule 0    ROS: C/o left leg weakness, left arm and face tingling and numbness  Physical Examination: Blood pressure 104/72, pulse (!) 45, temperature (!) 97.4 F (36.3 C), temperature source Oral, resp. rate 14, height 5\' 8"  (1.727 m), weight 90.1 kg, SpO2 93 %.  HEENT-  Normocephalic, no lesions, without obvious abnormality.  Normal  external eye and conjunctiva.  Normal TM's bilaterally.  Normal auditory canals and external ears. Normal external nose, mucus membranes and septum.  Normal pharynx. Neck supple with no masses, nodes, nodules or enlargement. Cardiovascular - regular rate and rhythm, S1, S2 normal, no murmur, click, rub or gallop Lungs - chest clear, no wheezing, rales, normal symmetric air entry, Heart exam - S1, S2 normal, no murmur, no gallop, rate regular Abdomen - soft, non-tender; bowel sounds normal; no masses,  no organomegaly Extremities - no edema  Neurologic Examination: Alert, awake, oriented x4, speech appears dysrarthric but patient state that thsi is his nle speech, follows commands CN: PERLA, EOMI, right hemianopsia ( per patient this  is old due to retinal scarring??), no nystagmus, flattenning of left naso labial field, decrease sensation to  touch on the left side of the face, palate and tongue midline. Motor:  5/5 on RUEX?RLEX on all modalties 5/5 on LUEX, 2/5 on LLEX sensory Decrease sensation to light touch on the left side No coordination deficit appreciated DTR/Gait not cheked at the time of examination  Results for orders placed or performed during the hospital encounter of 12/12/18 (from the past 48 hour(s))  Protime-INR     Status: None   Collection Time: 12/12/18  5:35 AM  Result Value Ref Range   Prothrombin Time 12.6 11.4 - 15.2 seconds   INR 1.0 0.8 - 1.2    Comment: (NOTE) INR goal varies based on device and disease states. Performed at [facility], [address] [place], [place] [address]   APTT     Status: None   Collection Time: 12/12/18  5:35 AM  Result Value Ref Range   aPTT 27 24 - 36 seconds    Comment: Performed at [facility], [address] [place], [place] [address]  CBC     Status: None   Collection Time: 12/12/18  5:35 AM  Result Value Ref Range   WBC 8.7 4.0 - 10.5 K/uL   RBC 4.67 4.22 - 5.81 MIL/uL   Hemoglobin 14.8 13.0 -  17.0 g/dL   HCT 42.1 39.0 - 52.0 %   MCV 90.1 80.0 - 100.0 fL   MCH 31.7 26.0 - 34.0 pg   MCHC 35.2 30.0 - 36.0 g/dL   RDW 12.9 11.5 - 15.5 %   Platelets 209 150 - 400 K/uL   nRBC 0.0 0.0 - 0.2 %    Comment: Performed at [facility], [address] [place], [place] [address]  Differential     Status: Abnormal   Collection Time: 12/12/18  5:35 AM  Result Value Ref Range   Neutrophils Relative % 43 %   Neutro Abs 3.8 1.7 - 7.7 K/uL   Lymphocytes Relative 41 %   Lymphs Abs 3.5 0.7 - 4.0 K/uL   Monocytes Relative 11 %   Monocytes Absolute 1.0 0.1 - 1.0 K/uL   Eosinophils Relative 3 %   Eosinophils Absolute 0.2 0.0 - 0.5 K/uL   Basophils Relative 1 %   Basophils Absolute 0.1 0.0 - 0.1 K/uL   Immature Granulocytes 1 %   Abs Immature Granulocytes 0.11 (H) 0.00 - 0.07 K/uL    Comment: Performed at [facility], [address] [place], [place] [address]  Comprehensive metabolic panel     Status: Abnormal   Collection Time: 12/12/18  5:35 AM  Result Value Ref Range   Sodium 138 135 - 145 mmol/L   Potassium 3.5 3.5 - 5.1 mmol/L   Chloride 103 98 - 111 mmol/L   CO2 27 22 - 32 mmol/L   Glucose, Bld 147 (H) 70 - 99 mg/dL   BUN 18 8 - 23 mg/dL   Creatinine, Ser 0.80 0.61 - 1.24 mg/dL   Calcium 9.3 8.9 - 10.3 mg/dL   Total Protein 7.2 6.5 - 8.1 g/dL   Albumin 3.9 3.5 - 5.0 g/dL   AST 29 15 - 41 U/L   ALT 44 0 - 44 U/L   Alkaline Phosphatase 79 38 - 126 U/L   Total Bilirubin 1.8 (H) 0.3 - 1.2 mg/dL   GFR calc non Af Amer >60 >60 mL/min   GFR calc Af Amer >60 >60 mL/min   Anion gap 8 5 - 15    Comment: Performed at [facility], [address] [place], [place] [address]  Troponin I (High Sensitivity)     Status: None   Collection Time: 12/12/18 12:34 PM  Result Value Ref Range   Troponin I (High Sensitivity) 10 <18 ng/L    Comment: (NOTE) Elevated high sensitivity troponin I (hsTnI) values and significant  changes across serial measurements may  suggest ACS but many other  chronic and acute conditions are known to elevate hsTnI results.  Refer to the "Links" section for chest pain algorithms and additional  guidance. Performed at [facility], [address] [place], [place] [address]   Hemoglobin A1c     Status: Abnormal   Collection Time: 12/13/18  5:18 AM  Result Value Ref Range   Hgb A1c MFr Bld 6.9 (H) 4.8 - 5.6 %    Comment: (NOTE) Pre diabetes:          5.7%-6.4% Diabetes:              >6.4% Glycemic control for   <7.0% adults with diabetes    Mean Plasma Glucose 151.33 mg/dL    Comment: Performed at [facility], [address] [place], [place] [address]  Lipid panel     Status: Abnormal   Collection Time: 12/13/18  5:18 AM  Result Value Ref Range   Cholesterol 148 0 - 200 mg/dL   Triglycerides 914 <782 mg/dL   HDL 30 (L) >95 mg/dL   Total CHOL/HDL Ratio 4.9 RATIO   VLDL 20 0 - 40 mg/dL   LDL Cholesterol 98 0 - 99 mg/dL    Comment:        Total Cholesterol/HDL:CHD Risk Coronary Heart Disease Risk Table                     Men   Women  1/2 Average Risk   3.4   3.3  Average Risk       5.0   4.4  2 X Average Risk   9.6   7.1  3 X Average Risk  23.4   11.0        Use the calculated Patient Ratio above and the CHD Risk Table to determine the patient's CHD Risk.        ATP III CLASSIFICATION (LDL):  <100     mg/dL   Optimal  621-308  mg/dL   Near or Above                    Optimal  130-159  mg/dL   Borderline  657-846  mg/dL   High  >962     mg/dL   Very High Performed at [facility], [address] [place], [place] [address]   Glucose, capillary     Status: Abnormal   Collection Time: 12/13/18  7:49 AM  Result Value Ref Range   Glucose-Capillary 189 (H) 70 - 99 mg/dL   Ct Angio Head W Or Wo Contrast  Result Date: 12/12/2018 CLINICAL DATA:  Coronary artery disease with new onset of left-sided weakness. Acute onset of left lower extremity weakness. Arm numbness and  heaviness. TIA, initial encounter. EXAM: CT ANGIOGRAPHY HEAD AND NECK TECHNIQUE: Multidetector CT imaging of the head and neck was performed using the standard protocol during bolus administration of intravenous contrast. Multiplanar CT image reconstructions and MIPs were obtained to evaluate the vascular anatomy. Carotid stenosis measurements (when applicable) are obtained utilizing NASCET criteria, using the distal internal carotid diameter as the denominator.  CONTRAST:  75mL OMNIPAQUE IOHEXOL 350 MG/ML SOLN COMPARISON:  CT head without contrast 12/11/2017. CTA head and neck 12/09/2017. FINDINGS: CTA NECK FINDINGS Aortic arch: Atherosclerotic calcifications are present at the origin of the left subclavian artery without a significant stenosis. Aorta is otherwise unremarkable. A standard three-vessel arch configuration present. Right carotid system: The right common carotid artery is within normal limits proximally. There is some atherosclerotic change at the right carotid bifurcation without a significant stenosis relative to the more distal vessel. The cervical right ICA is within normal limits. Left carotid system: The left common carotid artery is within normal limits. Atherosclerotic changes are noted at the bifurcation without a significant stenosis relative to the more distal vessel. The cervical left ICA is normal. Vertebral arteries: The vertebral arteries originate from the subclavian arteries bilaterally. The right vertebral artery is the dominant vessel. There is a 50% stenosis at the origin of the left vertebral artery. 60-70% stenosis is present at the origin of the right vertebral artery. The lumen is narrowed to 1.5 mm. No other significant stenosis is present in either vertebral artery in the neck. Skeleton: Vertebral body heights and alignment are maintained. There is straightening and some reversal of the normal cervical lordosis. The patient is edentulous. No focal lytic or blastic lesions are  present. Other neck: No focal mucosal or submucosal lesions are present. Thyroid is atrophic. Salivary glands are normal. No significant adenopathy is present. Upper chest: Lung apices are clear. A 5 mm polyp or nodule is present along the right side of the trachea. Additional soft tissue is noted in the posterior trachea at the thoracic inlet. Review of the MIP images confirms the above findings CTA HEAD FINDINGS Anterior circulation: Mild atherosclerotic changes are noted in the cavernous internal carotid arteries without a significant stenosis through the ICA termini. The A1 and M1 segments are normal. The anterior communicating artery is patent. MCA bifurcations are intact. ACA and MCA branch vessels are within normal limits. Posterior circulation: The right vertebral artery is dominant. PICA origins are visualized and normal. Vertebrobasilar junction is normal. Both posterior cerebral arteries originate from basilar tip. PCA branch vessels are within normal limits. Venous sinuses: The dural sinuses are patent. Straight sinus deep cerebral veins are intact. The right transverse sinus is dominant. Cortical veins are unremarkable. Anatomic variants: None Review of the MIP images confirms the above findings IMPRESSION: 1. No emergent large vessel occlusion. 2. Mild atherosclerotic changes at the cavernous internal carotid arteries and at the origin of the left subclavian artery without significant stenosis. 3. 60-70% stenosis of the dominant right vertebral artery origin. There is a 50% stenosis at the origin of the left vertebral artery. 4. Degenerative changes in the cervical spine. These results were called by telephone at the time of interpretation on 12/12/2018 at 11:59 am to Dr. Altamese DillingVAIBHAVKUMAR VACHHANI , who verbally acknowledged these results. Electronically Signed   By: Marin Robertshristopher  Mattern M.D.   On: 12/12/2018 12:10   Ct Angio Neck W Or Wo Contrast  Result Date: 12/12/2018 CLINICAL DATA:  Coronary artery  disease with new onset of left-sided weakness. Acute onset of left lower extremity weakness. Arm numbness and heaviness. TIA, initial encounter. EXAM: CT ANGIOGRAPHY HEAD AND NECK TECHNIQUE: Multidetector CT imaging of the head and neck was performed using the standard protocol during bolus administration of intravenous contrast. Multiplanar CT image reconstructions and MIPs were obtained to evaluate the vascular anatomy. Carotid stenosis measurements (when applicable) are obtained utilizing NASCET criteria, using the distal  internal carotid diameter as the denominator. CONTRAST:  75mL OMNIPAQUE IOHEXOL 350 MG/ML SOLN COMPARISON:  CT head without contrast 12/11/2017. CTA head and neck 12/09/2017. FINDINGS: CTA NECK FINDINGS Aortic arch: Atherosclerotic calcifications are present at the origin of the left subclavian artery without a significant stenosis. Aorta is otherwise unremarkable. A standard three-vessel arch configuration present. Right carotid system: The right common carotid artery is within normal limits proximally. There is some atherosclerotic change at the right carotid bifurcation without a significant stenosis relative to the more distal vessel. The cervical right ICA is within normal limits. Left carotid system: The left common carotid artery is within normal limits. Atherosclerotic changes are noted at the bifurcation without a significant stenosis relative to the more distal vessel. The cervical left ICA is normal. Vertebral arteries: The vertebral arteries originate from the subclavian arteries bilaterally. The right vertebral artery is the dominant vessel. There is a 50% stenosis at the origin of the left vertebral artery. 60-70% stenosis is present at the origin of the right vertebral artery. The lumen is narrowed to 1.5 mm. No other significant stenosis is present in either vertebral artery in the neck. Skeleton: Vertebral body heights and alignment are maintained. There is straightening and  some reversal of the normal cervical lordosis. The patient is edentulous. No focal lytic or blastic lesions are present. Other neck: No focal mucosal or submucosal lesions are present. Thyroid is atrophic. Salivary glands are normal. No significant adenopathy is present. Upper chest: Lung apices are clear. A 5 mm polyp or nodule is present along the right side of the trachea. Additional soft tissue is noted in the posterior trachea at the thoracic inlet. Review of the MIP images confirms the above findings CTA HEAD FINDINGS Anterior circulation: Mild atherosclerotic changes are noted in the cavernous internal carotid arteries without a significant stenosis through the ICA termini. The A1 and M1 segments are normal. The anterior communicating artery is patent. MCA bifurcations are intact. ACA and MCA branch vessels are within normal limits. Posterior circulation: The right vertebral artery is dominant. PICA origins are visualized and normal. Vertebrobasilar junction is normal. Both posterior cerebral arteries originate from basilar tip. PCA branch vessels are within normal limits. Venous sinuses: The dural sinuses are patent. Straight sinus deep cerebral veins are intact. The right transverse sinus is dominant. Cortical veins are unremarkable. Anatomic variants: None Review of the MIP images confirms the above findings IMPRESSION: 1. No emergent large vessel occlusion. 2. Mild atherosclerotic changes at the cavernous internal carotid arteries and at the origin of the left subclavian artery without significant stenosis. 3. 60-70% stenosis of the dominant right vertebral artery origin. There is a 50% stenosis at the origin of the left vertebral artery. 4. Degenerative changes in the cervical spine. These results were called by telephone at the time of interpretation on 12/12/2018 at 11:59 am to Dr. Altamese DillingVAIBHAVKUMAR VACHHANI , who verbally acknowledged these results. Electronically Signed   By: Marin Robertshristopher  Mattern M.D.    On: 12/12/2018 12:10   Ct Head Code Stroke Wo Contrast  Result Date: 12/12/2018 CLINICAL DATA:  Code stroke. Initial evaluation for left-sided numbness. EXAM: CT HEAD WITHOUT CONTRAST TECHNIQUE: Contiguous axial images were obtained from the base of the skull through the vertex without intravenous contrast. COMPARISON:  Prior CT from 05/01/2018. FINDINGS: Brain: Age-related cerebral atrophy with mild chronic small vessel ischemic disease. No acute intracranial hemorrhage. No acute large vessel territory infarct. No mass lesion, midline shift or mass effect. No hydrocephalus. No extra-axial fluid  collection. Vascular: No asymmetric hyperdense vessel. Scattered vascular calcifications noted within the carotid siphons. Skull: Scalp soft tissues and calvarium within normal limits. Sinuses/Orbits: Globes and orbital soft tissues are normal. Paranasal sinuses mastoid air cells are clear. Other: None. ASPECTS [facility]([place] Stroke Program Early CT Score) - Ganglionic level infarction (caudate, lentiform nuclei, internal capsule, insula, M1-M3 cortex): 7 - Supraganglionic infarction (M4-M6 cortex): 3 Total score (0-10 with 10 being normal): 10 IMPRESSION: 1. No acute intracranial infarct or other abnormality identified. 2. ASPECTS is 10. 3. Age-related cerebral atrophy with mild chronic small vessel ischemic disease. Critical Value/emergent results were called by telephone at the time of interpretation on 12/12/2018 at 6:09 am to Dr. Bayard MalesANDOLPH BROWN , who verbally acknowledged these results. Electronically Signed   By: Rise MuBenjamin  McClintock M.D.   On: 12/12/2018 06:10    Assessment: 73 y.o. male presents to ER with left face/arm tingling-numbness/left leg weakness. Neuro exam with left hemiparesis more on the leg. Head ct with no acute abniltes. CTA with no LVO. Not candidate for thromevctomy. New wake up stroke vs exacerbation of old stroke   Plan:   Neuro protective measures  includuing normothermia, normoglycemia,  correct electrolytes/metabolicabnilites, treat any infection. Keep BP on higher side for now 140-160 - Continue stroke w/up  MRI if feasible 3. PT consult, OT consult, Speech consult 4. Repeat Echocardiogram: Consullt cardiology. May need to be on anticaogulation 5. Carotid dopplers 6. Prophylactic therapy-Antiplatelet med: Aspirin - dose 325. May need to be on anticoagulation if no CI. 7. Risk factor modification 8. Telemetry monitoring 9. Frequent neuro checks     Tomi BambergerGuerch, MD Neurology 12/13/2018, 8:47 AM
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
## 2018-12-13 NOTE — Progress Notes (Signed)
Physical Therapy Treatment Patient Details Name: [name] [name] MRN: [id number] DOB: 03-Sep-1945 Today's Date: 12/13/2018    History of Present Illness [name] [name] is an 73 y.o. male presented to the ER with  left face/left arm numbness tingling/left leg weakness. Head CT with no acute abnormalities. PMH includes prior stroke (2019) c left sided symptoms and full resolution per patient,  HTN and CHF, CAD, spinal stenosis s/p surgery.    PT Comments    Pt returned from CT.  Stood from transport chair to walker with min assist.  Cues for hand placements and safety.  Sock was turned inside out to reduce friction and allow for independent movement.  He is able to minimally SLR and march in place but is only able to achieve a few degrees of independent movement.  Voices frustration.  He is able to walk 15' then 20' after seated rest.  He relies heavily on the walker for support while advancing RLE (good) out of fear LLE will not hold him.  While he is generally steady with static standing, he has poor recovery strategies, relies heavily on walker for support causing it to tip left or right during gait and at times takes a step too far into walker causing him to lurch forward.  He is fatigued with his effort.  After seated rest, he stood again for standing exercises as before.  BERG balance test where he scored 9/56. He is fearful with any attempts at unsupported standing and when he released walker handle he quickly grabs onto window sill and back to walker when cues to remove hand from window.  Stated he fears LLE will buckle without support.  He was able to stand and discuss care with MD but was visibly fatigued and needed cues to sit down for safety.  Pt is far from his baseline - independent prior to admit working full time at an active job.  CIR remains appropriate for discharge. He is unsafe for any mobility without assist.  He is open to CIR but stated he prefers [facility] if available but will  consider [facility] IP rehab.  Has not been to CIR prior but was there for a heart surgery per pt.     Follow Up Recommendations  CIR     Equipment Recommendations  Rolling walker with 5" wheels;3in1 (PT)    Recommendations for Other Services       Precautions / Restrictions Precautions Precautions: Fall Restrictions Weight Bearing Restrictions: No    Mobility  Bed Mobility    General bed mobility comments: in recliner  Transfers Overall transfer level: Needs assistance Equipment used: Rolling walker (2 wheeled) Transfers: Sit to/from Stand Sit to Stand: Min assist    Ambulation/Gait Ambulation/Gait assistance: Min Chemical engineerassist Gait Distance (Feet): 35 Feet Assistive device: Rolling walker (2 wheeled) Gait Pattern/deviations: Step-to pattern;Decreased stance time - left;Decreased step length - left;Decreased step length - right     General Gait Details: Lt leg mostly paretic, does not bear weight, hence heavy BUE support on RW with Rt stepping, LLE is progressed with minimal available hip flexion without knee flexion which is facilitate by author via Rt shoe donning and left grip sock turned inside-out.       Balance Overall balance assessment: Needs assistance Sitting-balance support: Feet supported Sitting balance-Leahy Scale: Fair     Standing balance support: Bilateral upper extremity supported Standing balance-Leahy Scale: Poor Standing balance comment: Heavy reliance on walker at all times, occasionally walker tips left/rigth due to amount  of UE weight on walker                 Standardized Balance Assessment Standardized Balance Assessment : Berg Balance Test Berg Balance Test Sit to Stand: Needs minimal aid to stand or to stabilize Standing Unsupported: Unable to stand 30 seconds unassisted Sitting with Back Unsupported but Feet Supported on Floor or Stool: Able to sit safely and securely 2 minutes Stand to Sit: Controls descent by using hands Transfers:  Needs one person to assist Standing Unsupported with Eyes Closed: Needs help to keep from falling Standing Ubsupported with Feet Together: Needs help to attain position and unable to hold for 15 seconds From Standing, Reach Forward with Outstretched Arm: Loses balance while trying/requires external support From Standing Position, Pick up Object from Floor: Unable to try/needs assist to keep balance From Standing Position, Turn to Look Behind Over each Shoulder: Needs assist to keep from losing balance and falling Turn 360 Degrees: Needs assistance while turning Standing Unsupported, Alternately Place Feet on Step/Stool: Needs assistance to keep from falling or unable to try Standing Unsupported, One Foot in Front: Loses balance while stepping or standing Standing on One Leg: Unable to try or needs assist to prevent fall Total Score: 9        Cognition Arousal/Alertness: Awake/alert Behavior During Therapy: WFL for tasks assessed/performed Overall Cognitive Status: Within Functional Limits for tasks assessed                                        Exercises Other Exercises Other Exercises: standing x 5 minutes with walker - SLR and marches x 20 BLE with only minimal ROM noted despite sock turned inside out.  voices frustration.  MD in and discussed care while in stanging and fatigue noted.  He needed to sit down to finish conversation.    General Comments        Pertinent Vitals/Pain Pain Assessment: No/denies pain    Home Living                      Prior Function            PT Goals (current goals can now be found in the care plan section) Progress towards PT goals: Progressing toward goals    Frequency    7X/week      PT Plan Current plan remains appropriate    Co-evaluation              AM-PAC PT "6 Clicks" Mobility   Outcome Measure  Help needed turning from your back to your side while in a flat bed without using bedrails?:  None Help needed moving from lying on your back to sitting on the side of a flat bed without using bedrails?: A Little Help needed moving to and from a bed to a chair (including a wheelchair)?: A Little Help needed standing up from a chair using your arms (e.g., wheelchair or bedside chair)?: A Little Help needed to walk in hospital room?: A Little Help needed climbing 3-5 steps with a railing? : A Lot 6 Click Score: 18    End of Session Equipment Utilized During Treatment: Gait belt Activity Tolerance: Patient tolerated treatment well;Patient limited by fatigue Patient left: in chair;with call bell/phone within reach;with chair alarm set Nurse Communication: Mobility status Hemiplegia - Right/Left: Left Hemiplegia - dominant/non-dominant: Non-dominant Hemiplegia - caused by:  Cerebral infarction     Time: 1227-1252 PT Time Calculation (min) (ACUTE ONLY): 25 min  Charges:  $Gait Training: 8-22 mins $Therapeutic Exercise: 8-22 mins                    Danielle Dess, PTA 12/13/18, 1:36 PM

## 2018-12-13 NOTE — Progress Notes (Signed)
*  PRELIMINARY RESULTS* Echocardiogram 2D Echocardiogram has been performed.  [name] [name] 12/13/2018, 9:58 AM

## 2018-12-14 DIAGNOSIS — E785 Hyperlipidemia, unspecified: Secondary | ICD-10-CM | POA: Diagnosis present

## 2018-12-14 DIAGNOSIS — Z8673 Personal history of transient ischemic attack (TIA), and cerebral infarction without residual deficits: Secondary | ICD-10-CM | POA: Diagnosis not present

## 2018-12-14 DIAGNOSIS — I2510 Atherosclerotic heart disease of native coronary artery without angina pectoris: Secondary | ICD-10-CM | POA: Diagnosis present

## 2018-12-14 DIAGNOSIS — R29704 NIHSS score 4: Secondary | ICD-10-CM | POA: Diagnosis present

## 2018-12-14 DIAGNOSIS — G629 Polyneuropathy, unspecified: Secondary | ICD-10-CM | POA: Diagnosis present

## 2018-12-14 DIAGNOSIS — I639 Cerebral infarction, unspecified: Secondary | ICD-10-CM | POA: Diagnosis present

## 2018-12-14 DIAGNOSIS — I252 Old myocardial infarction: Secondary | ICD-10-CM | POA: Diagnosis not present

## 2018-12-14 DIAGNOSIS — I429 Cardiomyopathy, unspecified: Secondary | ICD-10-CM | POA: Diagnosis present

## 2018-12-14 DIAGNOSIS — Z974 Presence of external hearing-aid: Secondary | ICD-10-CM | POA: Diagnosis not present

## 2018-12-14 DIAGNOSIS — Z20828 Contact with and (suspected) exposure to other viral communicable diseases: Secondary | ICD-10-CM | POA: Diagnosis present

## 2018-12-14 DIAGNOSIS — K219 Gastro-esophageal reflux disease without esophagitis: Secondary | ICD-10-CM | POA: Diagnosis present

## 2018-12-14 DIAGNOSIS — I509 Heart failure, unspecified: Secondary | ICD-10-CM | POA: Diagnosis present

## 2018-12-14 DIAGNOSIS — G8194 Hemiplegia, unspecified affecting left nondominant side: Secondary | ICD-10-CM | POA: Diagnosis present

## 2018-12-14 DIAGNOSIS — M1990 Unspecified osteoarthritis, unspecified site: Secondary | ICD-10-CM | POA: Diagnosis present

## 2018-12-14 DIAGNOSIS — Z9842 Cataract extraction status, left eye: Secondary | ICD-10-CM | POA: Diagnosis not present

## 2018-12-14 DIAGNOSIS — Z951 Presence of aortocoronary bypass graft: Secondary | ICD-10-CM | POA: Diagnosis not present

## 2018-12-14 DIAGNOSIS — Z9841 Cataract extraction status, right eye: Secondary | ICD-10-CM | POA: Diagnosis not present

## 2018-12-14 DIAGNOSIS — R200 Anesthesia of skin: Secondary | ICD-10-CM | POA: Diagnosis present

## 2018-12-14 DIAGNOSIS — Z87891 Personal history of nicotine dependence: Secondary | ICD-10-CM | POA: Diagnosis not present

## 2018-12-14 DIAGNOSIS — Z972 Presence of dental prosthetic device (complete) (partial): Secondary | ICD-10-CM | POA: Diagnosis not present

## 2018-12-14 DIAGNOSIS — Z961 Presence of intraocular lens: Secondary | ICD-10-CM | POA: Diagnosis present

## 2018-12-14 DIAGNOSIS — G459 Transient cerebral ischemic attack, unspecified: Secondary | ICD-10-CM | POA: Diagnosis present

## 2018-12-14 DIAGNOSIS — K5790 Diverticulosis of intestine, part unspecified, without perforation or abscess without bleeding: Secondary | ICD-10-CM | POA: Diagnosis present

## 2018-12-14 DIAGNOSIS — I110 Hypertensive heart disease with heart failure: Secondary | ICD-10-CM | POA: Diagnosis present

## 2018-12-14 DIAGNOSIS — I739 Peripheral vascular disease, unspecified: Secondary | ICD-10-CM | POA: Diagnosis present

## 2018-12-14 LAB — ECHOCARDIOGRAM COMPLETE
Height: 68 in
Weight: 3178.15 oz

## 2018-12-14 LAB — NOVEL CORONAVIRUS, NAA (HOSP ORDER, SEND-OUT TO REF LAB; TAT 18-24 HRS): SARS-CoV-2, NAA: NOT DETECTED

## 2018-12-14 MED ORDER — CLOPIDOGREL BISULFATE 75 MG PO TABS
75.0000 mg | ORAL_TABLET | Freq: Every day | ORAL | Status: DC
  Filled 2018-12-14: qty 1

## 2018-12-14 MED ORDER — ASPIRIN 325 MG PO TBEC: 325.0000 mg | DELAYED_RELEASE_TABLET | Freq: Every day | ORAL | 0 refills | Status: DC

## 2018-12-14 MED ORDER — EZETIMIBE 10 MG PO TABS: 10.0000 mg | ORAL_TABLET | Freq: Every day | ORAL | 0 refills | Status: DC
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
## 2018-12-14 NOTE — Progress Notes (Signed)
Received MD order to discharge patient home with home health, reviewed home meds, discharge instructions,  prescriptions and follow up appointments  with patient and patient verbalized understanding

## 2018-12-14 NOTE — TOC Transition Note (Signed)
Transition of Care [facility]) - CM/SW Discharge Note   Patient Details  Name: [name] [name] MRN: [id number] Date of Birth: [birth year]  Transition of [facility]) CM/SW Contact:  Tamla Winkels, Lenice Llamas Phone Number: [phone]  12/14/2018, 5:22 PM   Clinical Narrative: Clinical Social Worker (CSW) notified [facility] that patient will D/C today. Please reconsult if future social work needs arise. CSW signing off.      Final next level of care: [place] Barriers to Discharge: Barriers Resolved   Patient Goals and CMS Choice Patient states their goals for this hospitalization and ongoing recovery are:: To go home.   Choice offered to / list presented to : Patient  Discharge Placement                       Discharge Plan and Services In-house Referral: Clinical Social Work Discharge Planning Services: CM Consult Post Acute Care Choice: Home Health                    HH Arranged: PT, OT [facility] Agency: [place] Date [place]: 12/14/18   Representative spoke with at [place]: [place] ([place]) Interventions     Readmission Risk Interventions No flowsheet data found.

## 2018-12-14 NOTE — Progress Notes (Signed)
SLP Cancellation Note  Patient Details Name: [name] [name] MRN: [id number] DOB: June 14, 1945   Cancelled treatment:       Reason Eval/Treat Not Completed: SLP screened, no needs identified, will sign off; Chart reviewed. Nsg consulted. No evidence of aphasia, dysarthria, dysphagia, or cognitive linguistic impairment. Pt denies past or present dysphagia or s/s aspiration with current Regular diet with thin liquids.Pt is HOH and wears bilateral hearing aids. Spontaneous speech is 100% intelligible and composed of syntactically accurate fluent sentences. No current acute needs identified, SLP to sign off at this time. Please re-consult with any future change in status requiring re-assessment. Thank you for this consult.  Jadon Harbaugh, MA, CCC-SLP 12/14/2018, 1:59 PM
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
## 2018-12-14 NOTE — TOC Initial Note (Signed)
Transition of Care [facility]) - Initial/Assessment Note    Patient Details  Name: [name] [name] MRN: [id number] Date of Birth: 11-Sep-1945  [facility]) CM/SW Contact:    Vicky Schleich, Lenice Llamas Phone Number: [phone]  12/14/2018, 4:00 PM  Clinical Narrative: PT is recommending CIR. Per RN case manager she contacted [facility] and they do not have any beds today or this week. Clinical Education officer, museum (CSW) met with patient alone at bedside to discuss D/C plan. Patient was alert and oriented X4 and was sitting up in the chair at bedside. CSW explained role of CSW department. Per patient he lives in [place] with his wife Kalman Shan. CSW explained that PT is recommending CIR. Patient requested [facility]. CSW made patient aware that [facility] does not have any beds available. CSW explained that [facility] an inpatient re[facility] and patient reported he did not want to go there. CSW explained SNF process and patient adamantly refused SNF. Patient reported that he will go home with home health and requested [facility]. Per Kem Kays home health representative they can accept patient for home health PT and OT. Patient reported that he has a rollaider and a cane. Patient reported no other needs at this time. CSW will continue to follow and assist as needed.          Expected Discharge Plan: [place] Barriers to Discharge: Continued Medical Work up   Patient Goals and CMS Choice Patient states their goals for this hospitalization and ongoing recovery are:: To go home.   Choice offered to / list presented to : Patient  Expected Discharge Plan and Services Expected Discharge Plan: [place] In-house Referral: Clinical Social Work Discharge Planning Services: CM Consult Post Acute Care Choice: [place] arrangements for the past 2 months: [place]: PT, OT HH Agency: [facility] Date  [place]: 12/14/18   Representative spoke with at [place]: Georgina Snell  Prior Living Arrangements/Services Living arrangements for the past 2 months: Single Family Home Lives with:: Spouse Patient language and need for interpreter reviewed:: No Do you feel safe going back to the place where you live?: Yes      Need for Family Participation in Patient Care: No (Comment) Care giver support system in place?: Yes (comment) Current home services: DME(Patient has rollaider and cane at home.) Criminal Activity/Legal Involvement Pertinent to Current Situation/Hospitalization: No - Comment as needed  Activities of Daily Living Home Assistive Devices/Equipment: Eyeglasses, Dentures (specify type) ADL Screening (condition at time of admission) Patient's cognitive ability adequate to safely complete daily activities?: Yes Is the patient deaf or have difficulty hearing?: Yes Does the patient have difficulty seeing, even when wearing glasses/contacts?: No Does the patient have difficulty concentrating, remembering, or making decisions?: No Patient able to express need for assistance with ADLs?: Yes Does the patient have difficulty dressing or bathing?: No Independently performs ADLs?: Yes (appropriate for developmental age) Does the patient have difficulty walking or climbing stairs?: Yes Weakness of Legs: Left Weakness of Arms/Hands: Left  Permission Sought/Granted Permission sought to share information with : ([place]) Permission granted to share information with : Yes, Verbal Permission Granted              Emotional Assessment Appearance:: Appears stated  age   Affect (typically observed): Pleasant, Calm Orientation: : Oriented to Self, Oriented to Place, Oriented to  Time, Oriented to Situation Alcohol / Substance Use: Not Applicable Psych Involvement: No (comment)  Admission diagnosis:  TIA (transient ischemic attack) [G45.9] Cerebrovascular accident (CVA),  unspecified mechanism ([place]) [I63.9] Patient Active Problem List   Diagnosis Date Noted  . Stroke ([place]) 12/14/2018  . TIA (transient ischemic attack) 12/12/2018  . 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 Pressure injury of skin 04/30/2018  . Acute CVA (cerebrovascular accident) ([place]) 04/29/2018  . Obesity (BMI 30.0-34.9) 11/11/2017  . Stroke-like episode ([place]) - R brain, s/p tPA 11/09/2017  . PAD (peripheral artery disease) ([place]) 06/26/2016  . Pain in limb 06/04/2016  . Annual physical exam 05/31/2016  . Cerumen impaction 05/31/2016  . ERRONEOUS ENCOUNTER--DISREGARD 01/29/2016  . Difficulty in swallowing   . Hiatal hernia   . Gastritis   . Heartburn 10/11/2015  . Influenza-like illness 08/18/2015  . Sinusitis, acute 07/20/2015  . Elevation of level of transaminase and lactic acid dehydrogenase (LDH) 05/11/2015  . Back pain with radiation 05/11/2015  . Cardiomyopathy ([place]) 05/11/2015  . Esophageal lesion 05/11/2015  . Pain in the wrist 05/11/2015  . Low back pain with sciatica 05/11/2015  . Heart attack ([place]) 05/11/2015  . Absent peripheral pulse 05/11/2015  . ERRONEOUS ENCOUNTER--DISREGARD 04/26/2015  . Back pain, chronic 02/03/2015  . Leg swelling 02/03/2015  . Edema leg 02/03/2015  . CAD in native artery 02/03/2015  . Narrowing of intervertebral disc space 02/03/2015  . Acid reflux 02/03/2015  . Hypercholesteremia 02/03/2015  . BP (high blood pressure) 02/03/2015  . Cardiomyopathy, ischemic 02/03/2015  . Arthritis of hand, degenerative 02/03/2015  . Insomnia secondary to chronic pain 11/14/2014  . 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 Diuretic-induced hypokalemia 11/14/2014  . History of decompression of median nerve 10/03/2014  . Acquired polyneuropathy 09/28/2014  . Carpal tunnel syndrome 09/28/2014  . Peripheral neuropathy 09/28/2014  . Chest pain 10/20/2013  . Other specified cardiac arrhythmias 08/23/2013  . H/O coronary artery bypass surgery  08/21/2013  . Chronic low back pain with right-sided sciatica 08/19/2013  . Difficulty hearing 08/19/2013  . Arteriosclerosis of coronary artery 08/19/2013  . Esophageal lump 08/19/2013   PCP:  Tracie Harrier, MD Pharmacy:   [facility], [place] [address] Phone: [phone] Fax: [phone]     Social Determinants of Health (SDOH) Interventions    Readmission Risk Interventions No flowsheet data found.

## 2018-12-14 NOTE — Consult Note (Signed)
[facility]  CARDIOLOGY CONSULT NOTE  Patient ID: [name] [name] MRN: [id number] DOB/AGE: [birth year] 73 y.o.  Admit date: 12/12/2018 Referring Physician Dr. Elisabeth PigeonVachhani  Primary Physician Dr. Marcello FennelHande Primary Cardiologist Dr. Gwen PoundsKowalski  Reason for Consultation Apical akinesis, possible thrombus   HPI: [name] [name] is a 73 year old male with a past medical history significant for coronary artery disease s/p MI and CABG and history of CVA who presented to the ED on 12/12/18 for left-sided weakness.  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  Echocardiogram performed on 12/13/18 revealed normal LV function with an EF estimated between 55-60% with no evidence of LV thrombus; no significant valvular abnormalities noted.   Today, [name] [name] denies chest pain, palpitations, shortness of breath, orthopnea, PND, or syncopal/presyncopal episodes.    Review of systems complete and found to be negative unless listed above     Past Medical History:  Diagnosis Date  . Anginal pain (HCC)   . Arthritis   . Back pain    degenerative disc disease  . Carpal tunnel syndrome of right wrist   . CHF (congestive heart failure) (HCC)   . Coronary artery disease   . Diverticulosis   . GERD (gastroesophageal reflux disease)   . Myocardial infarction (HCC)   . Neuropathy   . Stroke (HCC)   . Wears dentures    full upper and lower  . Wears hearing aid    bilateral    Past Surgical History:  Procedure Laterality Date  . ANKLE RECONSTRUCTION Left 1991  . APPENDECTOMY    . BACK SURGERY N/A    lower back x 3  . CARDIAC CATHETERIZATION    . CARPAL TUNNEL RELEASE Left 03/2014   In Dr. Rosita KeaMenz office  . CARPAL TUNNEL RELEASE Left 10/20/2014   Procedure: CARPAL TUNNEL RELEASE;  Surgeon: Kennedy BuckerMichael Menz, MD;   Location: [facility] ORS;  Service: Orthopedics;  Laterality: Left;  . [facility]  [facility]  . ESOPHAGOGASTRODUODENOSCOPY (EGD) WITH PROPOFOL N/A 10/26/2015   Procedure: ESOPHAGOGASTRODUODENOSCOPY (EGD) WITH PROPOFOL with dialation;  Surgeon: Midge Miniumarren Wohl, MD;  [facility];  Service: Endoscopy;  Laterality: N/A;  . EYE SURGERY Bilateral    Cataract Extraction with IOL  . LUMBAR LAMINECTOMY/DECOMPRESSION MICRODISCECTOMY Bilateral 05/06/2018   Procedure: Bilateral Lumbar Four-Five Laminectomy/Foraminotomy;  Surgeon: Barnett AbuElsner, Henry, MD;  [facility];  Service: Neurosurgery;  Laterality: Bilateral;  Bilateral Lumbar 4-5 Laminectomy/Foraminotomy  . NECK SURGERY N/A    neck x 2, one discectomy and one shaved disc.  Marland Kitchen. SHOULDER ARTHROSCOPY Right 06/06/2015   Procedure: right shoulder arthroscopy, arthroscopic debridement, subacromial decompression, SLAP repair, biotenodesis, mini open rotator cuff repair;  Surgeon: Christena FlakeJohn J Poggi, MD;  Location: [facility] OR[facility];  Service: Orthopedics;  Laterality: Right;  . SHOULDER OPEN ROTATOR CUFF REPAIR     Left    Medications Prior to Admission  Medication Sig Dispense Refill Last Dose  . aspirin EC 81 MG tablet Take 162 mg by mouth daily.   12/11/2018 at Unknown time  . cyclobenzaprine (FLEXERIL) 10 MG tablet Take 1 tablet (10 mg total) by mouth 2 (two) times daily as needed. (Patient taking differently: Take 10 mg by mouth 2 (two) times a day. ) 180 tablet 0 12/11/2018 at Unknown time  . dicyclomine (  BENTYL) 10 MG capsule Take 1 capsule (10 mg total) by mouth 2 (two) times daily. 180 capsule 0 12/11/2018 at Unknown time  . gabapentin (NEURONTIN) 300 MG capsule Take 1 capsule (300 mg total) by mouth 2 (two) times daily. (Patient taking differently: Take 300 mg by mouth 3 (three) times daily. )   12/11/2018 at Unknown time  . Oxycodone HCl 10 MG TABS Take 1 tablet (10 mg total) by mouth every 12 (twelve) hours as needed  (severe pain). 15 tablet 0 12/11/2018 at Unknown time  . pantoprazole (PROTONIX) 40 MG tablet Take 1 tablet (40 mg total) by mouth every morning. (Patient taking differently: Take 40 mg by mouth daily. ) 90 tablet 1 12/11/2018 at Unknown time  . acetaminophen (TYLENOL) 325 MG tablet Take 2 tablets (650 mg total) by mouth every 4 (four) hours as needed for mild pain (or temp > 37.5 C (99.5 F)). (Patient not taking: Reported on 12/12/2018) 30 tablet 0 Not Taking at Unknown time  . docusate sodium (COLACE) 100 MG capsule Take 1 capsule (100 mg total) by mouth 2 (two) times daily. (Patient not taking: Reported on 12/12/2018) 60 capsule 0 Not Taking at Unknown time   Social History   Socioeconomic History  . Marital status: Married    Spouse name: Not on file  . Number of children: Not on file  . Years of education: Not on file  . Highest education level: Not on file  Occupational History  . Not on file  Social Needs  . Financial resource strain: Not on file  . Food insecurity    Worry: Not on file    Inability: Not on file  . Transportation needs    Medical: Not on file    Non-medical: Not on file  Tobacco Use  . Smoking status: Former Smoker    Quit date: 06/18/2013    Years since quitting: 5.4  . Smokeless tobacco: Never Used  Substance and Sexual Activity  . Alcohol use: Yes    Alcohol/week: 0.0 - 1.0 standard drinks    Comment: occasionally  . Drug use: No  . Sexual activity: Yes  Lifestyle  . Physical activity    Days per week: Not on file    Minutes per session: Not on file  . Stress: Not on file  Relationships  . Social Musicianconnections    Talks on phone: Not on file    Gets together: Not on file    Attends religious service: Not on file    Active member of club or organization: Not on file    Attends meetings of clubs or organizations: Not on file    Relationship status: Not on file  . Intimate partner violence    Fear of current or ex partner: Patient refused     Emotionally abused: Patient refused    Physically abused: Patient refused    Forced sexual activity: Patient refused  Other Topics Concern  . Not on file  Social History Narrative  . Not on file    Family History  Problem Relation Age of Onset  . Lung cancer Father   . Diabetes Mellitus II Sister   . Diverticulitis Sister       Review of systems complete and found to be negative unless listed above      PHYSICAL EXAM  General: Well developed, well nourished, in no acute distress HEENT:  Normocephalic and atramatic Neck:  No JVD.  Lungs: Clear bilaterally to auscultation and percussion. Heart: HRRR .  Normal S1 and S2 without gallops or murmurs.  Abdomen: Bowel sounds are positive, abdomen soft and non-tender  Msk:  Back normal. Normal strength and tone for age. Extremities: No clubbing, cyanosis or edema.   Neuro: Alert and oriented X 3. Psych:  Good affect, responds appropriately  Labs:   Lab Results  Component Value Date   WBC 8.7 12/12/2018   HGB 14.8 12/12/2018   HCT 42.1 12/12/2018   MCV 90.1 12/12/2018   PLT 209 12/12/2018    Recent Labs  Lab 12/12/18 0535  NA 138  K 3.5  CL 103  CO2 27  BUN 18  CREATININE 0.80  CALCIUM 9.3  PROT 7.2  BILITOT 1.8*  ALKPHOS 79  ALT 44  AST 29  GLUCOSE 147*   Lab Results  Component Value Date   CKTOTAL 36 (L) 09/07/2013   CKMB 1.5 09/07/2013   TROPONINI <0.03 04/30/2018    Lab Results  Component Value Date   CHOL 148 12/13/2018   CHOL 156 04/30/2018   CHOL 142 11/10/2017   Lab Results  Component Value Date   HDL 30 (L) 12/13/2018   HDL 38 (L) 04/30/2018   HDL 35 (L) 11/10/2017   Lab Results  Component Value Date   LDLCALC 98 12/13/2018   LDLCALC 91 04/30/2018   LDLCALC 93 11/10/2017   Lab Results  Component Value Date   TRIG 100 12/13/2018   TRIG 133 04/30/2018   TRIG 71 11/10/2017   Lab Results  Component Value Date   CHOLHDL 4.9 12/13/2018   CHOLHDL 4.1 04/30/2018   CHOLHDL 4.1  11/10/2017   No results found for: LDLDIRECT    Radiology: Ct Angio Head W Or Wo Contrast  Result Date: 12/12/2018 CLINICAL DATA:  Coronary artery disease with new onset of left-sided weakness. Acute onset of left lower extremity weakness. Arm numbness and heaviness. TIA, initial encounter. EXAM: CT ANGIOGRAPHY HEAD AND NECK TECHNIQUE: Multidetector CT imaging of the head and neck was performed using the standard protocol during bolus administration of intravenous contrast. Multiplanar CT image reconstructions and MIPs were obtained to evaluate the vascular anatomy. Carotid stenosis measurements (when applicable) are obtained utilizing NASCET criteria, using the distal internal carotid diameter as the denominator. CONTRAST:  37mL OMNIPAQUE IOHEXOL 350 MG/ML SOLN COMPARISON:  CT head without contrast 12/11/2017. CTA head and neck 12/09/2017. FINDINGS: CTA NECK FINDINGS Aortic arch: Atherosclerotic calcifications are present at the origin of the left subclavian artery without a significant stenosis. Aorta is otherwise unremarkable. A standard three-vessel arch configuration present. Right carotid system: The right common carotid artery is within normal limits proximally. There is some atherosclerotic change at the right carotid bifurcation without a significant stenosis relative to the more distal vessel. The cervical right ICA is within normal limits. Left carotid system: The left common carotid artery is within normal limits. Atherosclerotic changes are noted at the bifurcation without a significant stenosis relative to the more distal vessel. The cervical left ICA is normal. Vertebral arteries: The vertebral arteries originate from the subclavian arteries bilaterally. The right vertebral artery is the dominant vessel. There is a 50% stenosis at the origin of the left vertebral artery. 60-70% stenosis is present at the origin of the right vertebral artery. The lumen is narrowed to 1.5 mm. No other significant  stenosis is present in either vertebral artery in the neck. Skeleton: Vertebral body heights and alignment are maintained. There is straightening and some reversal of the normal cervical lordosis. The patient is edentulous. No  focal lytic or blastic lesions are present. Other neck: No focal mucosal or submucosal lesions are present. Thyroid is atrophic. Salivary glands are normal. No significant adenopathy is present. Upper chest: Lung apices are clear. A 5 mm polyp or nodule is present along the right side of the trachea. Additional soft tissue is noted in the posterior trachea at the thoracic inlet. Review of the MIP images confirms the above findings CTA HEAD FINDINGS Anterior circulation: Mild atherosclerotic changes are noted in the cavernous internal carotid arteries without a significant stenosis through the ICA termini. The A1 and M1 segments are normal. The anterior communicating artery is patent. MCA bifurcations are intact. ACA and MCA branch vessels are within normal limits. Posterior circulation: The right vertebral artery is dominant. PICA origins are visualized and normal. Vertebrobasilar junction is normal. Both posterior cerebral arteries originate from basilar tip. PCA branch vessels are within normal limits. Venous sinuses: The dural sinuses are patent. Straight sinus deep cerebral veins are intact. The right transverse sinus is dominant. Cortical veins are unremarkable. Anatomic variants: None Review of the MIP images confirms the above findings IMPRESSION: 1. No emergent large vessel occlusion. 2. Mild atherosclerotic changes at the cavernous internal carotid arteries and at the origin of the left subclavian artery without significant stenosis. 3. 60-70% stenosis of the dominant right vertebral artery origin. There is a 50% stenosis at the origin of the left vertebral artery. 4. Degenerative changes in the cervical spine. These results were called by telephone at the time of interpretation on  12/12/2018 at 11:59 am to Dr. Altamese Dilling , who verbally acknowledged these results. Electronically Signed   By: Marin Roberts M.D.   On: 12/12/2018 12:10   Ct Head Wo Contrast  Result Date: 12/13/2018 CLINICAL DATA:  Follow-up stroke.  Left-sided numbness. EXAM: CT HEAD WITHOUT CONTRAST TECHNIQUE: Contiguous axial images were obtained from the base of the skull through the vertex without intravenous contrast. COMPARISON:  CT head and CTA head and neck 12/12/2018. CT head 05/01/2018 FINDINGS: Brain: Mild atrophy and white matter changes are present bilaterally. No acute or developing infarct is present. Basal ganglia are intact. Insular ribbon is normal bilaterally. No acute or focal cortical abnormality is present. The ventricles are of normal size. No significant extraaxial fluid collection is present. The brainstem and cerebellum are within normal limits. Vascular: No hyperdense vessel or unexpected calcification. Skull: Calvarium is intact. No focal lytic or blastic lesions are present. Sinuses/Orbits: The paranasal sinuses and mastoid air cells are clear. The globes and orbits are within normal limits. IMPRESSION: 1. Stable mild atrophy and white matter disease. 2. No acute intracranial abnormality or significant interval change. Electronically Signed   By: Marin Roberts M.D.   On: 12/13/2018 12:57   Ct Angio Neck W Or Wo Contrast  Result Date: 12/12/2018 CLINICAL DATA:  Coronary artery disease with new onset of left-sided weakness. Acute onset of left lower extremity weakness. Arm numbness and heaviness. TIA, initial encounter. EXAM: CT ANGIOGRAPHY HEAD AND NECK TECHNIQUE: Multidetector CT imaging of the head and neck was performed using the standard protocol during bolus administration of intravenous contrast. Multiplanar CT image reconstructions and MIPs were obtained to evaluate the vascular anatomy. Carotid stenosis measurements (when applicable) are obtained utilizing NASCET  criteria, using the distal internal carotid diameter as the denominator. CONTRAST:  75mL OMNIPAQUE IOHEXOL 350 MG/ML SOLN COMPARISON:  CT head without contrast 12/11/2017. CTA head and neck 12/09/2017. FINDINGS: CTA NECK FINDINGS Aortic arch: Atherosclerotic calcifications are present at  the origin of the left subclavian artery without a significant stenosis. Aorta is otherwise unremarkable. A standard three-vessel arch configuration present. Right carotid system: The right common carotid artery is within normal limits proximally. There is some atherosclerotic change at the right carotid bifurcation without a significant stenosis relative to the more distal vessel. The cervical right ICA is within normal limits. Left carotid system: The left common carotid artery is within normal limits. Atherosclerotic changes are noted at the bifurcation without a significant stenosis relative to the more distal vessel. The cervical left ICA is normal. Vertebral arteries: The vertebral arteries originate from the subclavian arteries bilaterally. The right vertebral artery is the dominant vessel. There is a 50% stenosis at the origin of the left vertebral artery. 60-70% stenosis is present at the origin of the right vertebral artery. The lumen is narrowed to 1.5 mm. No other significant stenosis is present in either vertebral artery in the neck. Skeleton: Vertebral body heights and alignment are maintained. There is straightening and some reversal of the normal cervical lordosis. The patient is edentulous. No focal lytic or blastic lesions are present. Other neck: No focal mucosal or submucosal lesions are present. Thyroid is atrophic. Salivary glands are normal. No significant adenopathy is present. Upper chest: Lung apices are clear. A 5 mm polyp or nodule is present along the right side of the trachea. Additional soft tissue is noted in the posterior trachea at the thoracic inlet. Review of the MIP images confirms the above  findings CTA HEAD FINDINGS Anterior circulation: Mild atherosclerotic changes are noted in the cavernous internal carotid arteries without a significant stenosis through the ICA termini. The A1 and M1 segments are normal. The anterior communicating artery is patent. MCA bifurcations are intact. ACA and MCA branch vessels are within normal limits. Posterior circulation: The right vertebral artery is dominant. PICA origins are visualized and normal. Vertebrobasilar junction is normal. Both posterior cerebral arteries originate from basilar tip. PCA branch vessels are within normal limits. Venous sinuses: The dural sinuses are patent. Straight sinus deep cerebral veins are intact. The right transverse sinus is dominant. Cortical veins are unremarkable. Anatomic variants: None Review of the MIP images confirms the above findings IMPRESSION: 1. No emergent large vessel occlusion. 2. Mild atherosclerotic changes at the cavernous internal carotid arteries and at the origin of the left subclavian artery without significant stenosis. 3. 60-70% stenosis of the dominant right vertebral artery origin. There is a 50% stenosis at the origin of the left vertebral artery. 4. Degenerative changes in the cervical spine. These results were called by telephone at the time of interpretation on 12/12/2018 at 11:59 am to Dr. Altamese DillingVAIBHAVKUMAR VACHHANI , who verbally acknowledged these results. Electronically Signed   By: Marin Robertshristopher  Mattern M.D.   On: 12/12/2018 12:10   Ct Head Code Stroke Wo Contrast  Result Date: 12/12/2018 CLINICAL DATA:  Code stroke. Initial evaluation for left-sided numbness. EXAM: CT HEAD WITHOUT CONTRAST TECHNIQUE: Contiguous axial images were obtained from the base of the skull through the vertex without intravenous contrast. COMPARISON:  Prior CT from 05/01/2018. FINDINGS: Brain: Age-related cerebral atrophy with mild chronic small vessel ischemic disease. No acute intracranial hemorrhage. No acute large vessel  territory infarct. No mass lesion, midline shift or mass effect. No hydrocephalus. No extra-axial fluid collection. Vascular: No asymmetric hyperdense vessel. Scattered vascular calcifications noted within the carotid siphons. Skull: Scalp soft tissues and calvarium within normal limits. Sinuses/Orbits: Globes and orbital soft tissues are normal. Paranasal sinuses mastoid air cells are  clear. Other: None. ASPECTS [facility] Stroke Program Early CT Score) - Ganglionic level infarction (caudate, lentiform nuclei, internal capsule, insula, M1-M3 cortex): 7 - Supraganglionic infarction (M4-M6 cortex): 3 Total score (0-10 with 10 being normal): 10 IMPRESSION: 1. No acute intracranial infarct or other abnormality identified. 2. ASPECTS is 10. 3. Age-related cerebral atrophy with mild chronic small vessel ischemic disease. Critical Value/emergent results were called by telephone at the time of interpretation on 12/12/2018 at 6:09 am to Dr. Bayard Males , who verbally acknowledged these results. Electronically Signed   By: Rise Mu M.D.   On: 12/12/2018 06:10    EKG: Sinus rhythm   ASSESSMENT AND PLAN:  1.  LV thrombus/apical akinesis    -No evidence of LV thrombus on recent echocardiogram; anticoagulation not indicated at this time   -No further cardiac workup indicated in an inpatient setting; follow up with Dr. Gwen Pounds at [facility] upon discharge  2.  Left sided weakness   -No evidence of acute abnormalities on CT; MRI contraindicated   -Neurology on board    The history, physical exam findings, and plan of care were all discussed with Dr. Harold Hedge, and all decision making was made in collaboration.   Signed: Andi Hence PA-C 12/14/2018, 11:59 AM

## 2018-12-14 NOTE — Progress Notes (Signed)
Physical Therapy Treatment Patient Details Name: [name] [name] MRN: [id number] DOB: [birth year] Today's Date: 12/14/2018    History of Present Illness [name] [name] is an 73 y.o. male presented to the ER with  left face/left arm numbness tingling/left leg weakness. Head CT with no acute abnormalities. PMH includes prior stroke (2019) c left sided symptoms and full resolution per patient,  HTN and CHF, CAD, spinal stenosis s/p surgery.    PT Comments    Pt voicing general frustrations today regarding continued weakness and change from baseline mobility.  Voices general dislike for hospitals etc.  Struggles to EOB but no physical assist needed.  Once sitting, generally steady.  He tries to stand several times from bed and falls backwards.  Cues for proper hand placements but he scoots to end of bed and uses footboard for support.  Ambulates 15', 5' 15' and 5' with heavy reliance on walker. Reaches for doorframe to pull himself up from wheelchair despite cues and education. He is able to advance his L foot better today.  Sock was on properly with grippie on outside and he was able to clear his foot but still only advances it a few inches at a time.  Distance is limited by feeling of LLE weakness and feeling of buckling.  He seems generally more frustrated today and takes his mask off during gait and puts it in the garbage.  "I hate these things."  Due to pt's Covid test still pending, pt was assisted to his recliner and session was stopped per current protocols.  Pt may do better with surgical mask with ties next session if test is still pending.  He did remain in recliner after session.   Follow Up Recommendations  CIR     Equipment Recommendations  Rolling walker with 5" wheels;3in1 (PT)    Recommendations for Other Services       Precautions / Restrictions Precautions Precautions: Fall    Mobility  Bed Mobility Overal bed mobility: Needs Assistance Bed Mobility: Supine to Sit      Supine to sit: Min assist     General bed mobility comments: struggles to sit upright, uses railings for assist  Transfers Overall transfer level: Needs assistance Equipment used: Rolling walker (2 wheeled) Transfers: Sit to/from Stand Sit to Stand: Min guard;Min assist         General transfer comment: unconventional hand placements throughout session - moving to bottom of bed to use footboard to stand from bed and grabbing doorframe from wheelchair.  Ambulation/Gait Ambulation/Gait assistance: Min assist Gait Distance (Feet): 15 Feet Assistive device: Rolling walker (2 wheeled) Gait Pattern/deviations: Step-to pattern;Step-through pattern;Decreased step length - right;Decreased step length - left;Decreased stance time - left Gait velocity: decreased   General Gait Details: irregular gait pattern - changes from step-to to throught during session.  15' x 2, 5' x 2 limited by feeling of LLE wanting to buckle/fatigue.   Stairs             Wheelchair Mobility    Modified Rankin (Stroke Patients Only)       Balance Overall balance assessment: Needs assistance Sitting-balance support: Feet supported Sitting balance-Leahy Scale: Good     Standing balance support: Bilateral upper extremity supported Standing balance-Leahy Scale: Poor Standing balance comment: Heavy reliance on walker at all times, no walker tipping today with gait.  Cognition Arousal/Alertness: Awake/alert Behavior During Therapy: WFL for tasks assessed/performed;Agitated Overall Cognitive Status: Within Functional Limits for tasks assessed                                 General Comments: generally frustrated over weakness in LLE and having to wear a mask.      Exercises      General Comments        Pertinent Vitals/Pain Pain Assessment: No/denies pain    Home Living                      Prior Function             PT Goals (current goals can now be found in the care plan section) Progress towards PT goals: Progressing toward goals    Frequency    7X/week      PT Plan Current plan remains appropriate    Co-evaluation              AM-PAC PT "6 Clicks" Mobility   Outcome Measure  Help needed turning from your back to your side while in a flat bed without using bedrails?: None Help needed moving from lying on your back to sitting on the side of a flat bed without using bedrails?: A Little Help needed moving to and from a bed to a chair (including a wheelchair)?: A Little Help needed standing up from a chair using your arms (e.g., wheelchair or bedside chair)?: A Little Help needed to walk in hospital room?: A Little Help needed climbing 3-5 steps with a railing? : A Lot 6 Click Score: 18    End of Session Equipment Utilized During Treatment: Gait belt Activity Tolerance: Treatment limited secondary to agitation;Patient tolerated treatment well Patient left: in chair;with call bell/phone within reach;with chair alarm set   Hemiplegia - Right/Left: Left Hemiplegia - dominant/non-dominant: Non-dominant Hemiplegia - caused by: Cerebral infarction     Time: 1020-1037 PT Time Calculation (min) (ACUTE ONLY): 17 min  Charges:  $Gait Training: 8-22 mins                     Chesley Noon, PTA 12/14/18, 11:16 AM

## 2018-12-14 NOTE — Progress Notes (Signed)
Subjective: Patient reports no new neurological complaints.  Has had no change in presenting symptoms.  Objective: Current vital signs: BP 112/75 (BP Location: Left Arm)   Pulse (!) 52   Temp 97.6 F (36.4 C) (Axillary)   Resp 17   Ht 5\' 8"  (1.727 m)   Wt 90.1 kg   SpO2 98%   BMI 30.20 kg/m  Vital signs in last 24 hours: Temp:  [97.5 F (36.4 C)-98.1 F (36.7 C)] 97.6 F (36.4 C) (07/13 0920) Pulse Rate:  [44-62] 52 (07/13 0920) Resp:  [16-19] 17 (07/13 0920) BP: (112-139)/(59-88) 112/75 (07/13 0920) SpO2:  [95 %-100 %] 98 % (07/13 0920)  Intake/Output from previous day: 07/12 0701 - 07/13 0700 In: 2657 [I.V.:2657] Out: -  Intake/Output this shift: Total I/O In: 240 [P.O.:240] Out: -  Nutritional status:  Diet Order            Diet Heart Room service appropriate? Yes; Fluid consistency: Thin  Diet effective now              Neurologic Exam: Mental Status: Alert, oriented, thought content appropriate.  Speech fluent without evidence of aphasia.  Able to follow 3 step commands without difficulty. Cranial Nerves: II: Discs flat bilaterally; Visual fields grossly normal, pupils equal, round, reactive to light and accommodation III,IV, VI: ptosis not present, extra-ocular motions intact bilaterally V,VII: smile symmetric, facial light touch sensation normal bilaterally VIII: hearing normal bilaterally IX,X: gag reflex present XI: bilateral shoulder shrug XII: midline tongue extension Motor: Right : Upper extremity   5/5    Left:     Upper extremity   5/5  Lower extremity   5/5     Lower extremity   1-2/5, patient gives no reciprocal downward movement of the RLE when attempting to move the left Tone and bulk:normal tone throughout; no atrophy noted Sensory: Pinprick and light touch decreased on the left upper and lower extremities  Lab Results: Basic Metabolic Panel: Recent Labs  Lab 12/12/18 0535  NA 138  K 3.5  CL 103  CO2 27  GLUCOSE 147*  BUN 18   CREATININE 0.80  CALCIUM 9.3    Liver Function Tests: Recent Labs  Lab 12/12/18 0535  AST 29  ALT 44  ALKPHOS 79  BILITOT 1.8*  PROT 7.2  ALBUMIN 3.9   No results for input(s): LIPASE, AMYLASE in the last 168 hours. No results for input(s): AMMONIA in the last 168 hours.  CBC: Recent Labs  Lab 12/12/18 0535  WBC 8.7  NEUTROABS 3.8  HGB 14.8  HCT 42.1  MCV 90.1  PLT 209    Cardiac Enzymes: No results for input(s): CKTOTAL, CKMB, CKMBINDEX, TROPONINI in the last 168 hours.  Lipid Panel: Recent Labs  Lab 12/13/18 0518  CHOL 148  TRIG 100  HDL 30*  CHOLHDL 4.9  VLDL 20  LDLCALC 98    CBG: Recent Labs  Lab 12/13/18 0749 12/13/18 1236  GLUCAP 189* 206*    Microbiology: Results for orders placed or performed during the hospital encounter of 04/29/18  Surgical pcr screen     Status: None   Collection Time: 05/06/18  2:29 AM   Specimen: Nasal Mucosa; Nasal Swab  Result Value Ref Range Status   MRSA, PCR NEGATIVE NEGATIVE Final   Staphylococcus aureus NEGATIVE NEGATIVE Final    Comment: (NOTE) The Xpert SA Assay (FDA approved for NASAL specimens in patients 21 years of age and older), is one component of a comprehensive surveillance program. It  is not intended to diagnose infection nor to guide or monitor treatment. Performed at [facility], [place] [address] [place], [place] [address]     Coagulation Studies: Recent Labs    12/12/18 0535  LABPROT 12.6  INR 1.0    Imaging: Ct Head Wo Contrast  Result Date: 12/13/2018 CLINICAL DATA:  Follow-up stroke.  Left-sided numbness. EXAM: CT HEAD WITHOUT CONTRAST TECHNIQUE: Contiguous axial images were obtained from the base of the skull through the vertex without intravenous contrast. COMPARISON:  CT head and CTA head and neck 12/12/2018. CT head 05/01/2018 FINDINGS: Brain: Mild atrophy and white matter changes are present bilaterally. No acute or developing infarct is present. Basal ganglia are  intact. Insular ribbon is normal bilaterally. No acute or focal cortical abnormality is present. The ventricles are of normal size. No significant extraaxial fluid collection is present. The brainstem and cerebellum are within normal limits. Vascular: No hyperdense vessel or unexpected calcification. Skull: Calvarium is intact. No focal lytic or blastic lesions are present. Sinuses/Orbits: The paranasal sinuses and mastoid air cells are clear. The globes and orbits are within normal limits. IMPRESSION: 1. Stable mild atrophy and white matter disease. 2. No acute intracranial abnormality or significant interval change. Electronically Signed   By: San Morelle M.D.   On: 12/13/2018 12:57    Medications:  I have reviewed the patient's current medications. Scheduled: . aspirin EC  325 mg Oral Daily  . dicyclomine  10 mg Oral BID  . ezetimibe  10 mg Oral Daily  . gabapentin  100 mg Oral q morning - 10a  . gabapentin  200 mg Oral QHS  . heparin  5,000 Units Subcutaneous Q8H  . pantoprazole  40 mg Oral q morning - 10a    Assessment/Plan: 73 year old male presenting with left hemiparesis.  Head CT reviewed and shows no acute changes.  MRI unable to be performed.  Acute infarct suspected although patient not fully cooperative with examination.  Patient on ASA prior to admission.  CTA shows right vertebral stenosis at 60-70%.  Echocardiogram with poor visualization of the apex and an EF of 55-60%.  A1c 6.9, LDL 98.  1. PT consult, OT consult, Speech consult 2. Cardiology evaluating patient to determine how to better visualized heart chambers and if anticoagulation necessary based on findings.   3. Prophylactic therapy-Dual antiplatelet therapy with ASA 81mg  and Plavix 75mg  for three weeks with change to Plavix 75mg  alone as monotherapy after that time. 4. Telemetry monitoring 5. Frequent neuro checks 6. Statin for lipid management with target LDL<70.   LOS: 0 days   [name] Goodell,  MD Neurology [phone] 12/14/2018  12:24 PM

## 2018-12-14 NOTE — Progress Notes (Signed)
Confirmed with patient he does not has a legal guardian

## 2018-12-21 DIAGNOSIS — I6523 Occlusion and stenosis of bilateral carotid arteries: Secondary | ICD-10-CM | POA: Insufficient documentation

## 2019-09-13 ENCOUNTER — Encounter: Payer: Self-pay | Admitting: Emergency Medicine

## 2019-09-13 ENCOUNTER — Other Ambulatory Visit: Payer: Self-pay

## 2019-09-13 ENCOUNTER — Emergency Department: Payer: Medicare Other

## 2019-09-13 DIAGNOSIS — Z87891 Personal history of nicotine dependence: Secondary | ICD-10-CM | POA: Insufficient documentation

## 2019-09-13 DIAGNOSIS — Z79899 Other long term (current) drug therapy: Secondary | ICD-10-CM | POA: Diagnosis not present

## 2019-09-13 DIAGNOSIS — Z7982 Long term (current) use of aspirin: Secondary | ICD-10-CM | POA: Insufficient documentation

## 2019-09-13 DIAGNOSIS — Z20822 Contact with and (suspected) exposure to covid-19: Secondary | ICD-10-CM | POA: Diagnosis not present

## 2019-09-13 DIAGNOSIS — R911 Solitary pulmonary nodule: Secondary | ICD-10-CM | POA: Diagnosis not present

## 2019-09-13 DIAGNOSIS — I509 Heart failure, unspecified: Secondary | ICD-10-CM | POA: Diagnosis not present

## 2019-09-13 DIAGNOSIS — R0789 Other chest pain: Secondary | ICD-10-CM | POA: Insufficient documentation

## 2019-09-13 DIAGNOSIS — Z951 Presence of aortocoronary bypass graft: Secondary | ICD-10-CM | POA: Diagnosis not present

## 2019-09-13 DIAGNOSIS — I2510 Atherosclerotic heart disease of native coronary artery without angina pectoris: Secondary | ICD-10-CM | POA: Insufficient documentation

## 2019-09-13 LAB — BASIC METABOLIC PANEL
Anion gap: 8 (ref 5–15)
BUN: 21 mg/dL (ref 8–23)
CO2: 25 mmol/L (ref 22–32)
Calcium: 9.8 mg/dL (ref 8.9–10.3)
Chloride: 105 mmol/L (ref 98–111)
Creatinine, Ser: 0.81 mg/dL (ref 0.61–1.24)
GFR calc Af Amer: >60 mL/min (ref >60)
GFR calc non Af Amer: >60 mL/min (ref >60)
Glucose, Bld: 121 mg/dL — ABNORMAL HIGH (ref 70–99)
Potassium: 3.7 mmol/L (ref 3.5–5.1)
Sodium: 138 mmol/L (ref 135–145)

## 2019-09-13 LAB — CBC
HCT: 41 % (ref 39.0–52.0)
Hemoglobin: 14.8 g/dL (ref 13.0–17.0)
MCH: 32.1 pg (ref 26.0–34.0)
MCHC: 36.1 g/dL — ABNORMAL HIGH (ref 30.0–36.0)
MCV: 88.9 fL (ref 80.0–100.0)
Platelets: 217 10*3/uL (ref 150–400)
RBC: 4.61 MIL/uL (ref 4.22–5.81)
RDW: 12.7 % (ref 11.5–15.5)
WBC: 9.4 10*3/uL (ref 4.0–10.5)
nRBC: 0 % (ref 0.0–0.2)

## 2019-09-13 LAB — TROPONIN I (HIGH SENSITIVITY): Troponin I (High Sensitivity): 13 ng/L (ref <18)

## 2019-09-13 MED ORDER — SODIUM CHLORIDE 0.9% FLUSH: 3.0000 mL | Freq: Once | INTRAVENOUS | Status: DC

## 2019-09-14 ENCOUNTER — Emergency Department
Admission: EM | Admit: 2019-09-14 | Discharge: 2019-09-14 | Discharge status: Against Medical Advice | Payer: Medicare Other | Attending: Student | Admitting: Student

## 2019-09-14 ENCOUNTER — Other Ambulatory Visit: Payer: Self-pay | Admitting: Oncology

## 2019-09-14 DIAGNOSIS — R911 Solitary pulmonary nodule: Secondary | ICD-10-CM

## 2019-09-14 DIAGNOSIS — R0789 Other chest pain: Secondary | ICD-10-CM

## 2019-09-14 LAB — SARS CORONAVIRUS 2 (TAT 6-24 HRS): SARS Coronavirus 2: NEGATIVE

## 2019-09-14 LAB — TROPONIN I (HIGH SENSITIVITY): Troponin I (High Sensitivity): 11 ng/L (ref <18)

## 2019-09-14 NOTE — ED Notes (Signed)
Pt located in lobby. He had been asleep. Troponin drawn and pt placed in subwait for next available room.

## 2019-09-14 NOTE — ED Notes (Signed)
Pt called from lobby to be taken to exam room. No answer from pt.

## 2019-09-14 NOTE — ED Notes (Signed)
Pt verbalized understanding of discharge instructions. NAD at this time. 

## 2019-09-14 NOTE — Progress Notes (Signed)
  Pulmonary Nodule Clinic Telephone Note [facility]   Received referral from Dr. Colon Branch in the ED.   HPI: [name] is a 74 year old male patient with past medical history significant for CAD status post CABG, HF and several other comorbidities who presented to the emergency room on 12/14/2019 for chest pressure.  Work-up included imaging which revealed a nodular opacity in the right midlung.  CT chest recommended for further characterization.  Review and Recommendations: I personally reviewed all patient's previous imaging including most recent chest x-ray.   Reviewed a chest x-ray from November 2017 which revealed chronic bronchitic changes, stable mild cardiomegaly with mild pulmonary interstitial edema.  No alveolar pneumonia.  I recommend follow-up with non-contrast chest CT in the next 1 to 2 weeks for further characterization of nodular opacity in the right midlung.  Social History: Patient is a former smoker.  He quit in January 2015.  High risk factors include: History of heavy smoking, exposure to asbestos, radium or uranium, personal family history of lung cancer, older age, sex (females greater than males), race (black and native [place] greater than weight), marginal speculation, upper lobe location, multiplicity (less than 5 nodules increases risk for malignancy) and emphysema and/or pulmonary fibrosis.   This recommendation follows the consensus statement: Guidelines for Management of Incidental Pulmonary Nodules Detected on CT Images: From the Fleischner Society 2017; Radiology 2017; 284:228-243.    I have placed order for CT scan without contrast to be completed in 1 to 2 weeks.   Disposition: Order placed for CT chest. Will notify [name] [name] in scheduling. [name] [name] to call patient with appointment date and time. Return to pulmonary nodule clinic a few days after his repeat imaging to discuss results and plan moving forward.  [name] Hurt, NP  09/14/2019 10:10 AM

## 2019-09-14 NOTE — ED Notes (Signed)
Pt called from lobby with no reply. Unable to locate pt.  

## 2019-09-14 NOTE — Discharge Instructions (Signed)
Thank you for letting us take care of you in the emergency department today.   You will receive a phone call in 1 to 2 days if your COVID swab is positive.  Please continue to take any regular, prescribed medications.   Please follow up with: Your cardiology doctor to review your ER visit and follow up on your symptoms.   We have also placed a referral for you to follow-up in the pulmonary nodule clinic, as we discussed.  Someone from the clinic should be reaching out to you to schedule your appointment. If you do not hear from them in ~1 week, call the number below.  Please return to the ER for any new or worsening symptoms.

## 2019-09-14 NOTE — ED Provider Notes (Addendum)
[facility] Emergency Department Provider Note  ____________________________________________   First MD Initiated Contact with Patient 09/14/19 [phone]     (approximate)  I have reviewed the triage vital signs and the nursing notes.  History  Chief Complaint Chest Pain    HPI [name] [name] is a 74 y.o. male past medical history as noted below, including CAD status post CABG, HF who presents emergency department for chest pressure.  Patient states at around 9 PM last night he developed central chest pressure, moderate in severity, no radiation.  Denies any exertional component, states he was watching TV.  Associated with a feeling of "not getting enough air".  No nausea or vomiting, no diaphoresis.  Received ASA and nitro spray with EMS which improved his symptoms, on my evaluation he reports resolution in his symptoms.  States he simply feels generally weak all over, but denies any further shortness of breath or chest pressure.  No lateralizing weakness.  No leg swelling or pain.  No fevers, cough, vomiting, diarrhea, dysuria.  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  Has not been vaccinated against COVID.   Past Medical Hx Past Medical History:  Diagnosis Date  . Anginal pain ([place])   . Arthritis   . Back pain    degenerative disc disease  . Carpal tunnel syndrome of right wrist   . CHF (congestive heart failure) ([place])   . Coronary artery disease   . Diverticulosis   . GERD (gastroesophageal reflux disease)   . Myocardial infarction ([place])   . Neuropathy   . Stroke ([place])   . Wears dentures    full upper and lower  . Wears hearing aid    bilateral    Problem List Patient Active Problem List   Diagnosis Date Noted  . Stroke ([place]) 12/14/2018  . TIA (transient ischemic attack) 12/12/2018  . 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 Pressure injury of skin 04/30/2018  . Acute CVA (cerebrovascular  accident) ([place]) 04/29/2018  . Obesity (BMI 30.0-34.9) 11/11/2017  . Stroke-like episode ([place]) - R brain, s/p tPA 11/09/2017  . PAD (peripheral artery disease) ([place]) 06/26/2016  . Pain in limb 06/04/2016  . Annual physical exam 05/31/2016  . Cerumen impaction 05/31/2016  . ERRONEOUS ENCOUNTER--DISREGARD 01/29/2016  . Difficulty in swallowing   . Hiatal hernia   . Gastritis   . Heartburn 10/11/2015  . Influenza-like illness 08/18/2015  . Sinusitis, acute 07/20/2015  . Elevation of level of transaminase and lactic acid dehydrogenase (LDH) 05/11/2015  . Back pain with radiation 05/11/2015  . Cardiomyopathy ([place]) 05/11/2015  . Esophageal lesion 05/11/2015  . Pain in the wrist 05/11/2015  . Low back pain with sciatica 05/11/2015  . Heart attack ([place]) 05/11/2015  . Absent peripheral pulse 05/11/2015  . ERRONEOUS ENCOUNTER--DISREGARD 04/26/2015  . Back pain, chronic 02/03/2015  . Leg swelling 02/03/2015  . Edema leg 02/03/2015  . CAD in native artery 02/03/2015  . Narrowing of intervertebral disc space 02/03/2015  . Acid reflux 02/03/2015  . Hypercholesteremia 02/03/2015  . BP (high blood pressure) 02/03/2015  . Cardiomyopathy, ischemic 02/03/2015  . Arthritis of hand, degenerative 02/03/2015  . Insomnia secondary to chronic pain 11/14/2014  . 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 Diuretic-induced hypokalemia 11/14/2014  . History of decompression of median nerve 10/03/2014  . Acquired polyneuropathy 09/28/2014  . Carpal tunnel syndrome 09/28/2014  . Peripheral neuropathy 09/28/2014  . Chest  pain 10/20/2013  . Other specified cardiac arrhythmias 08/23/2013  . H/O coronary artery bypass surgery 08/21/2013  . Chronic low back pain with right-sided sciatica 08/19/2013  . Difficulty hearing 08/19/2013  . Arteriosclerosis of coronary artery 08/19/2013  . Esophageal lump 08/19/2013    Past Surgical Hx Past Surgical History:  Procedure Laterality Date  . ANKLE  RECONSTRUCTION Left 1991  . APPENDECTOMY    . BACK SURGERY N/A    lower back x 3  . CARDIAC CATHETERIZATION    . CARPAL TUNNEL RELEASE Left 03/2014   In Dr. Rosita Kea office  . CARPAL TUNNEL RELEASE Left 10/20/2014   Procedure: CARPAL TUNNEL RELEASE;  Surgeon: Kennedy Bucker, MD;  Location: [facility] ORS;  Service: Orthopedics;  Laterality: Left;  . CORONARY ARTERY BYPASS GRAFT  March 2014   [facility]  . ESOPHAGOGASTRODUODENOSCOPY (EGD) WITH PROPOFOL N/A 10/26/2015   Procedure: ESOPHAGOGASTRODUODENOSCOPY (EGD) WITH PROPOFOL with dialation;  Surgeon: [name], MD;  Location: [facility];  Service: Endoscopy;  Laterality: N/A;  . EYE SURGERY Bilateral    Cataract Extraction with IOL  . LUMBAR LAMINECTOMY/DECOMPRESSION MICRODISCECTOMY Bilateral 05/06/2018   Procedure: Bilateral Lumbar Four-Five Laminectomy/Foraminotomy;  Surgeon: Barnett Abu, MD;  Location: [facility] OR;  Service: Neurosurgery;  Laterality: Bilateral;  Bilateral Lumbar 4-5 Laminectomy/Foraminotomy  . NECK SURGERY N/A    neck x 2, one discectomy and one shaved disc.  Marland Kitchen SHOULDER ARTHROSCOPY Right 06/06/2015   Procedure: right shoulder arthroscopy, arthroscopic debridement, subacromial decompression, SLAP repair, biotenodesis, mini open rotator cuff repair;  Surgeon: Christena Flake, MD;  Location: [facility] ORS;  Service: Orthopedics;  Laterality: Right;  . SHOULDER OPEN ROTATOR CUFF REPAIR     Left    Medications Prior to Admission medications   Medication Sig Start Date End Date Taking? Authorizing Provider  acetaminophen (TYLENOL) 325 MG tablet Take 2 tablets (650 mg total) by mouth every 4 (four) hours as needed for mild pain (or temp > 37.5 C (99.5 F)). Patient not taking: Reported on 12/12/2018 05/08/18   Cathren Harsh, MD  aspirin EC 325 MG EC tablet Take 1 tablet (325 mg total) by mouth daily. 12/15/18   Altamese Dilling, MD  cyclobenzaprine (FLEXERIL) 10 MG tablet Take 1 tablet (10 mg total) by mouth 2 (two) times  daily as needed. Patient taking differently: Take 10 mg by mouth 2 (two) times a day.  07/02/17   Ellyn Hack, MD  dicyclomine (BENTYL) 10 MG capsule Take 1 capsule (10 mg total) by mouth 2 (two) times daily. 07/02/17   Ellyn Hack, MD  ezetimibe (ZETIA) 10 MG tablet Take 1 tablet (10 mg total) by mouth daily. 12/15/18   Altamese Dilling, MD  gabapentin (NEURONTIN) 300 MG capsule Take 1 capsule (300 mg total) by mouth 2 (two) times daily. Patient taking differently: Take 300 mg by mouth 3 (three) times daily.  11/11/17   Layne Benton, NP  Oxycodone HCl 10 MG TABS Take 1 tablet (10 mg total) by mouth every 12 (twelve) hours as needed (severe pain). 05/08/18   Rai, Ripudeep K, MD  pantoprazole (PROTONIX) 40 MG tablet Take 1 tablet (40 mg total) by mouth every morning. Patient taking differently: Take 40 mg by mouth daily.  03/28/17   Ellyn Hack, MD    Allergies Parafon forte dsc [chlorzoxazone], Statins, Cucumber extract, and Plavix [clopidogrel bisulfate]  Family Hx Family History  Problem Relation Age of Onset  . Lung cancer Father   . Diabetes  Mellitus II Sister   . Diverticulitis Sister     Social Hx Social History   Tobacco Use  . Smoking status: Former Smoker    Quit date: 06/18/2013    Years since quitting: 6.2  . Smokeless tobacco: Never Used  Substance Use Topics  . Alcohol use: Yes    Alcohol/week: 0.0 - 1.0 standard drinks    Comment: occasionally  . Drug use: No     Review of Systems  Constitutional: Negative for fever. Negative for chills. Eyes: Negative for visual changes. ENT: Negative for sore throat. Cardiovascular: Positive for chest pressure. Respiratory: Negative for shortness of breath. Gastrointestinal: Negative for nausea. Negative for vomiting.  Genitourinary: Negative for dysuria. Musculoskeletal: Negative for leg swelling. Skin: Negative for rash. Neurological: Negative for headaches.   Physical Exam  Vital Signs: ED  Triage Vitals  Enc Vitals Group     BP 09/13/19 2248 113/77     Pulse Rate 09/13/19 2248 75     Resp 09/13/19 2248 (!) 22     Temp 09/13/19 2248 98 F (36.7 C)     Temp Source 09/13/19 2248 Oral     SpO2 09/13/19 2248 96 %     Weight 09/13/19 2248 206 lb (93.4 kg)     Height 09/13/19 2248 5\' 10"  (1.778 m)     Head Circumference --      Peak Flow --      Pain Score 09/13/19 2247 6     Pain Loc --      Pain Edu? --      Excl. in GC? --     Constitutional: Alert and oriented. Well appearing. NAD.  Head: Normocephalic. Atraumatic. Eyes: Conjunctivae clear. Sclera anicteric. Pupils equal and symmetric. Nose: No masses or lesions. No congestion or rhinorrhea. Mouth/Throat: MM moist. Neck: No stridor. Trachea midline.  Cardiovascular: Normal rate, regular rhythm. Extremities well perfused.  Equal and symmetric radial pulses. Respiratory: Normal respiratory effort.  Lungs CTAB.  No wheezing. Gastrointestinal: Soft. Non-distended. Non-tender.  Genitourinary: Deferred. Musculoskeletal: No lower extremity edema. No deformities. No leg swelling, edema, or asymmetry. No tenderness to palpation of calves.  Neurologic:  Normal speech and language. No gross focal or lateralizing neurologic deficits are appreciated.  Skin: Skin is warm, dry and intact. No rash noted. Psychiatric: Mood and affect are appropriate for situation.  EKG  Personally reviewed and interpreted by myself.   Date: 09/13/19 Time: 2248 Rate: 76 Rhythm: sinus Axis: left Intervals: WNL Non-specific ST/T findings No significant changes compared to prior EKG from 12/12/18 No STEMI    Radiology  Personally reviewed available imaging myself.   CXR IMPRESSION:  1. Nodular opacity in the right mid lung. Chest CT recommended for  further characterization.  2. No focal airspace consolidation or pulmonary edema.    Procedures  Procedure(s) performed (including critical care):  Procedures   Initial Impression  / Assessment and Plan / MDM / ED Course  74 y.o. male who presents to the ED for an episode of chest pressure, now resolved, and generalized weakness.  Ddx: ACS, angina, pneumonia/pulmonary infection, COVID, anemia, electrolyte abnormality.  No leg swelling, no history of VTE, no hypoxia, no tachycardia, therefore doubt PE.  Patient already received ASA with EMS.  Work-up initiated in triage reveals high-sensitivity troponin x 2 negative.  Electrolytes without actionable derangements.  No anemia.  CXR as above, pulmonary nodule noted, updated patient on this incidental finding, placed referral to the pulmonary nodule clinic.  Otherwise, given he is  now chest pain free, with negative work-up, including unchanged EKG compared to prior and two negative troponins, feel patient is stable for discharge with close outpatient follow-up with his cardiologist, he voices understanding of this.  Patient agreeable to COVID swab prior to discharge.  Given return precautions.  _______________________________   As part of my medical decision making I have reviewed available labs, radiology tests, reviewed old records/chart review.    Final Clinical Impression(s) / ED Diagnosis  Final diagnoses:  Chest pressure  Pulmonary nodule       Note:  This document was prepared using Dragon voice recognition software and may include unintentional dictation errors.     Miguel Aschoff., MD 09/14/19 0830

## 2019-09-17 ENCOUNTER — Telehealth: Payer: Self-pay | Admitting: *Deleted

## 2019-09-17 NOTE — Telephone Encounter (Signed)
Referral received for pt to be seen in Lung Nodule Clinic for further workup of incidental lung nodule with follow up CT scan. Left message with patient to call back to discuss clinic and review recommendations and upcoming appts including follow up CT scan and visit with Jenny Burns, NP to discuss results. Awaiting call back.  

## 2019-09-20 ENCOUNTER — Telehealth: Payer: Self-pay | Admitting: *Deleted

## 2019-09-20 NOTE — Telephone Encounter (Signed)
Message left to review referral and upcoming appts in the lung nodule clinic. Instructed to call back.

## 2019-09-27 ENCOUNTER — Ambulatory Visit: Admission: RE | Admit: 2019-09-27 | Payer: Medicare Other | Source: Ambulatory Visit

## 2019-09-28 ENCOUNTER — Ambulatory Visit: Payer: Medicare Other | Admitting: Oncology

## 2019-09-29 ENCOUNTER — Telehealth: Payer: Self-pay | Admitting: *Deleted

## 2019-09-29 ENCOUNTER — Inpatient Hospital Stay: Payer: Medicare Other | Attending: Oncology | Admitting: Oncology

## 2019-09-29 NOTE — Telephone Encounter (Signed)
Pt was a no show for CT scan on 4/26 therefore appt with Boneta Lucks, NP on 4/28 has been cancelled. Mutiple attempts made to contact pt without success. Letter mailed to patient to call back to reschedule appts. PCP made aware of incidental findings and that pt failed to show for scheduled appts.

## 2020-05-10 ENCOUNTER — Encounter: Payer: Self-pay | Admitting: Intensive Care

## 2020-05-10 ENCOUNTER — Emergency Department: Payer: Medicare Other

## 2020-05-10 ENCOUNTER — Observation Stay
Admission: EM | Admit: 2020-05-10 | Discharge: 2020-05-12 | Disposition: A | Discharge status: Home / Self Care | Payer: Medicare Other | Attending: Internal Medicine | Admitting: Internal Medicine

## 2020-05-10 ENCOUNTER — Observation Stay: Payer: Medicare Other

## 2020-05-10 ENCOUNTER — Other Ambulatory Visit: Payer: Self-pay

## 2020-05-10 DIAGNOSIS — I509 Heart failure, unspecified: Secondary | ICD-10-CM | POA: Insufficient documentation

## 2020-05-10 DIAGNOSIS — I110 Hypertensive heart disease with heart failure: Secondary | ICD-10-CM | POA: Diagnosis not present

## 2020-05-10 DIAGNOSIS — I6389 Other cerebral infarction: Secondary | ICD-10-CM

## 2020-05-10 DIAGNOSIS — Z87891 Personal history of nicotine dependence: Secondary | ICD-10-CM | POA: Diagnosis not present

## 2020-05-10 DIAGNOSIS — I2510 Atherosclerotic heart disease of native coronary artery without angina pectoris: Secondary | ICD-10-CM | POA: Diagnosis not present

## 2020-05-10 DIAGNOSIS — I6381 Other cerebral infarction due to occlusion or stenosis of small artery: Secondary | ICD-10-CM

## 2020-05-10 DIAGNOSIS — Z951 Presence of aortocoronary bypass graft: Secondary | ICD-10-CM

## 2020-05-10 DIAGNOSIS — Z8673 Personal history of transient ischemic attack (TIA), and cerebral infarction without residual deficits: Secondary | ICD-10-CM

## 2020-05-10 DIAGNOSIS — Z9889 Other specified postprocedural states: Secondary | ICD-10-CM

## 2020-05-10 DIAGNOSIS — R200 Anesthesia of skin: Secondary | ICD-10-CM

## 2020-05-10 DIAGNOSIS — I639 Cerebral infarction, unspecified: Secondary | ICD-10-CM | POA: Diagnosis not present

## 2020-05-10 DIAGNOSIS — R29898 Other symptoms and signs involving the musculoskeletal system: Secondary | ICD-10-CM

## 2020-05-10 DIAGNOSIS — R202 Paresthesia of skin: Secondary | ICD-10-CM | POA: Diagnosis present

## 2020-05-10 DIAGNOSIS — Z79899 Other long term (current) drug therapy: Secondary | ICD-10-CM | POA: Diagnosis not present

## 2020-05-10 DIAGNOSIS — Z7982 Long term (current) use of aspirin: Secondary | ICD-10-CM | POA: Insufficient documentation

## 2020-05-10 DIAGNOSIS — IMO0001 Reserved for inherently not codable concepts without codable children: Secondary | ICD-10-CM

## 2020-05-10 DIAGNOSIS — Z20822 Contact with and (suspected) exposure to covid-19: Secondary | ICD-10-CM | POA: Diagnosis not present

## 2020-05-10 DIAGNOSIS — E118 Type 2 diabetes mellitus with unspecified complications: Secondary | ICD-10-CM

## 2020-05-10 DIAGNOSIS — H9190 Unspecified hearing loss, unspecified ear: Secondary | ICD-10-CM

## 2020-05-10 HISTORY — DX: Type 2 diabetes mellitus with unspecified complications: E11.8

## 2020-05-10 HISTORY — DX: Other cerebral infarction due to occlusion or stenosis of small artery: I63.81

## 2020-05-10 LAB — DIFFERENTIAL
Abs Immature Granulocytes: 0.1 10*3/uL — ABNORMAL HIGH (ref 0.00–0.07)
Basophils Absolute: 0.1 10*3/uL (ref 0.0–0.1)
Basophils Relative: 1 %
Eosinophils Absolute: 0.2 10*3/uL (ref 0.0–0.5)
Eosinophils Relative: 3 %
Immature Granulocytes: 1 %
Lymphocytes Relative: 43 %
Lymphs Abs: 3.1 10*3/uL (ref 0.7–4.0)
Monocytes Absolute: 0.8 10*3/uL (ref 0.1–1.0)
Monocytes Relative: 11 %
Neutro Abs: 3 10*3/uL (ref 1.7–7.7)
Neutrophils Relative %: 41 %

## 2020-05-10 LAB — COMPREHENSIVE METABOLIC PANEL
ALT: 33 U/L (ref 0–44)
AST: 29 U/L (ref 15–41)
Albumin: 4.1 g/dL (ref 3.5–5.0)
Alkaline Phosphatase: 65 U/L (ref 38–126)
Anion gap: 9 (ref 5–15)
BUN: 16 mg/dL (ref 8–23)
CO2: 26 mmol/L (ref 22–32)
Calcium: 9.3 mg/dL (ref 8.9–10.3)
Chloride: 103 mmol/L (ref 98–111)
Creatinine, Ser: 0.71 mg/dL (ref 0.61–1.24)
GFR, Estimated: >60 mL/min (ref >60)
Glucose, Bld: 161 mg/dL — ABNORMAL HIGH (ref 70–99)
Potassium: 3.7 mmol/L (ref 3.5–5.1)
Sodium: 138 mmol/L (ref 135–145)
Total Bilirubin: 1.1 mg/dL (ref 0.3–1.2)
Total Protein: 7.7 g/dL (ref 6.5–8.1)

## 2020-05-10 LAB — CBC
HCT: 41.2 % (ref 39.0–52.0)
Hemoglobin: 14.8 g/dL (ref 13.0–17.0)
MCH: 32.3 pg (ref 26.0–34.0)
MCHC: 35.9 g/dL (ref 30.0–36.0)
MCV: 90 fL (ref 80.0–100.0)
Platelets: 208 10*3/uL (ref 150–400)
RBC: 4.58 MIL/uL (ref 4.22–5.81)
RDW: 12.7 % (ref 11.5–15.5)
WBC: 7.3 10*3/uL (ref 4.0–10.5)
nRBC: 0 % (ref 0.0–0.2)

## 2020-05-10 LAB — RESP PANEL BY RT-PCR (FLU A&B, COVID) ARPGX2
Influenza A by PCR: NEGATIVE
Influenza B by PCR: NEGATIVE
SARS Coronavirus 2 by RT PCR: NEGATIVE

## 2020-05-10 LAB — PROTIME-INR
INR: 0.9 (ref 0.8–1.2)
Prothrombin Time: 12 seconds (ref 11.4–15.2)

## 2020-05-10 LAB — APTT: aPTT: 28 seconds (ref 24–36)

## 2020-05-10 MED ORDER — ASPIRIN 81 MG PO CHEW
324.0000 mg | CHEWABLE_TABLET | Freq: Once | ORAL | Status: AC
  Administered 2020-05-10: 324 mg via ORAL
  Filled 2020-05-10: qty 4

## 2020-05-10 MED ORDER — ENOXAPARIN SODIUM 40 MG/0.4ML ~~LOC~~ SOLN: 40.0000 mg | SUBCUTANEOUS | Status: DC

## 2020-05-10 MED ORDER — INSULIN ASPART 100 UNIT/ML ~~LOC~~ SOLN: 0.0000 [IU] | Freq: Every day | SUBCUTANEOUS | Status: DC

## 2020-05-10 MED ORDER — INSULIN ASPART 100 UNIT/ML ~~LOC~~ SOLN
0.0000 [IU] | Freq: Three times a day (TID) | SUBCUTANEOUS | Status: DC
  Administered 2020-05-11: 3 [IU] via SUBCUTANEOUS
  Administered 2020-05-12: 2 [IU] via SUBCUTANEOUS
  Filled 2020-05-10 (×2): qty 1

## 2020-05-10 MED ORDER — ENOXAPARIN SODIUM 60 MG/0.6ML ~~LOC~~ SOLN: 0.5000 mg/kg | SUBCUTANEOUS | Status: DC

## 2020-05-10 MED ORDER — ACETAMINOPHEN 325 MG PO TABS: 650.0000 mg | ORAL_TABLET | ORAL | Status: DC | PRN

## 2020-05-10 MED ORDER — STROKE: EARLY STAGES OF RECOVERY BOOK: Freq: Once | Status: DC

## 2020-05-10 MED ORDER — ENOXAPARIN SODIUM 60 MG/0.6ML ~~LOC~~ SOLN
47.0000 mg | SUBCUTANEOUS | Status: DC
  Administered 2020-05-11: 47.5 mg via SUBCUTANEOUS
  Filled 2020-05-10 (×3): qty 0.6

## 2020-05-10 MED ORDER — ACETAMINOPHEN 650 MG RE SUPP: 650.0000 mg | RECTAL | Status: DC | PRN

## 2020-05-10 MED ORDER — SODIUM CHLORIDE 0.9 % IV SOLN: INTRAVENOUS | Status: DC

## 2020-05-10 MED ORDER — ACETAMINOPHEN 160 MG/5ML PO SOLN
650.0000 mg | ORAL | Status: DC | PRN
  Filled 2020-05-10: qty 20.3

## 2020-05-10 NOTE — ED Triage Notes (Signed)
Patient presents with left leg numbness that started yesterday afternoon. Patient cannot lift leg or put weight on it. Bilateral grips equal. No facial droop. Speech clear.
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
## 2020-05-10 NOTE — ED Notes (Signed)
PT given blanket and pillow by Brayton Caves, EDT
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
## 2020-05-10 NOTE — Progress Notes (Signed)
PHARMACIST - PHYSICIAN COMMUNICATION  CONCERNING:  Enoxaparin (Lovenox) for DVT Prophylaxis    RECOMMENDATION: Patient was prescribed enoxaprin 40mg  q24 hours for VTE prophylaxis.   Filed Weights   05/10/20 1531  Weight: 93.4 kg (206 lb)    Body mass index is 32.75 kg/m.  Estimated Creatinine Clearance: 90.2 mL/min (by C-G formula based on SCr of 0.71 mg/dL).   Based on [facility] policy patient is candidate for enoxaparin 0.5mg /kg TBW SQ every 24 hours based on BMI being >30.  Patient is candidate for enoxaparin 30mg  every 24 hours based on CrCl <28ml/min or Weight <45kg  DESCRIPTION: Pharmacy has adjusted enoxaparin dose per [facility] policy.  Patient is now receiving enoxaparin 47 mg every 24 hours    31m, PharmD Clinical Pharmacist  05/10/2020 10:24 PM

## 2020-05-11 ENCOUNTER — Observation Stay
Admit: 2020-05-11 | Discharge: 2020-05-11 | Disposition: A | Discharge status: Home / Self Care | Payer: Medicare Other | Attending: Internal Medicine | Admitting: Internal Medicine

## 2020-05-11 ENCOUNTER — Encounter: Payer: Self-pay | Admitting: Internal Medicine

## 2020-05-11 ENCOUNTER — Observation Stay: Payer: Medicare Other

## 2020-05-11 DIAGNOSIS — I639 Cerebral infarction, unspecified: Secondary | ICD-10-CM

## 2020-05-11 LAB — ECHOCARDIOGRAM COMPLETE
AR max vel: 3.09 cm2
AV Area VTI: 2.71 cm2
AV Area mean vel: 3.02 cm2
AV Mean grad: 3 mmHg
AV Peak grad: 6.1 mmHg
Ao pk vel: 1.23 m/s
Area-P 1/2: 3.87 cm2
Height: 66.5 in
S' Lateral: 3.71 cm
Weight: 3296 oz

## 2020-05-11 LAB — LIPID PANEL
Cholesterol: 87 mg/dL (ref 0–200)
HDL: 47 mg/dL (ref >40)
LDL Cholesterol: 30 mg/dL (ref 0–99)
Total CHOL/HDL Ratio: 1.9 RATIO
Triglycerides: 48 mg/dL (ref <150)
VLDL: 10 mg/dL (ref 0–40)

## 2020-05-11 LAB — HEMOGLOBIN A1C
Hgb A1c MFr Bld: 6.3 % — ABNORMAL HIGH (ref 4.8–5.6)
Mean Plasma Glucose: 134.11 mg/dL

## 2020-05-11 LAB — GLUCOSE, CAPILLARY
Glucose-Capillary: 183 mg/dL — ABNORMAL HIGH (ref 70–99)
Glucose-Capillary: 118 mg/dL — ABNORMAL HIGH (ref 70–99)

## 2020-05-11 LAB — CBG MONITORING, ED
Glucose-Capillary: 108 mg/dL — ABNORMAL HIGH (ref 70–99)
Glucose-Capillary: 105 mg/dL — ABNORMAL HIGH (ref 70–99)
Glucose-Capillary: 111 mg/dL — ABNORMAL HIGH (ref 70–99)

## 2020-05-11 LAB — CREATININE, SERUM
Creatinine, Ser: 0.64 mg/dL (ref 0.61–1.24)
GFR, Estimated: >60 mL/min (ref >60)

## 2020-05-11 MED ORDER — CYCLOBENZAPRINE HCL 10 MG PO TABS
10.0000 mg | ORAL_TABLET | Freq: Three times a day (TID) | ORAL | Status: DC | PRN
  Administered 2020-05-11 (×2): 10 mg via ORAL
  Filled 2020-05-11 (×2): qty 1

## 2020-05-11 MED ORDER — PERFLUTREN LIPID MICROSPHERE
1.0000 mL | INTRAVENOUS | Status: AC | PRN
  Administered 2020-05-11: 2 mL via INTRAVENOUS
  Filled 2020-05-11: qty 10

## 2020-05-11 MED ORDER — OXYCODONE HCL 5 MG PO TABS
5.0000 mg | ORAL_TABLET | ORAL | Status: DC | PRN
  Administered 2020-05-11 – 2020-05-12 (×4): 5 mg via ORAL
  Filled 2020-05-11 (×4): qty 1

## 2020-05-11 MED ORDER — IOHEXOL 350 MG/ML SOLN
75.0000 mL | Freq: Once | INTRAVENOUS | Status: AC | PRN
  Administered 2020-05-11: 75 mL via INTRAVENOUS
  Filled 2020-05-11: qty 75

## 2020-05-11 NOTE — ED Notes (Signed)
Pt assisted to and from the bathroom with cane

## 2020-05-11 NOTE — Progress Notes (Signed)
OT Cancellation Note  Patient Details Name: [name] [name] MRN: [id number] DOB: June 30, 1945   Cancelled Treatment:    Reason Eval/Treat Not Completed: Other (comment). Consult received, chart reviewed. Imaging pending, neurology consult pending. Will re-attempt at later date/time as appropriate.   Richrd Prime, MPH, MS, OTR/L ascom [phone] 05/11/20, 10:09 AM
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
## 2020-05-11 NOTE — ED Notes (Signed)
Patient denies pain and is resting comfortably.  

## 2020-05-11 NOTE — ED Notes (Signed)
Pt given meal tray.

## 2020-05-11 NOTE — ED Notes (Signed)
Transport requested by secretary.  

## 2020-05-11 NOTE — Progress Notes (Addendum)
SLP Cancellation Note  Patient Details Name: [name] [name] MRN: [id number] DOB: [birth year]   Cancelled treatment:       Reason Eval/Treat Not Completed: SLP screened, no needs identified, will sign off (chart reviewed; consulted NSG then met w/ pt, Wife)  Met w/ pt and Wife in room. Pt was plugging in his phone for charging, talking w/ his Wife, and reading his newspaper in front of him. He is min HOH w/ aid but w/ min increase in volume of speech, pt stated he heard this SLP adequately. Pt was A/O x3. He denied any difficulty swallowing and is currently on a regular diet; has not swallowed pills yet w/ NSG -- encouraged pt to sit fully upright for All oral intake and eat/drink slowly. NSG will consult if any deficits noted w/ next po's. Pt conversed in conversational w/out overt deficits noted; pt and Wife denied any speech-language deficits.  No further skilled ST services indicated as pt appears at his baseline. Pt agreed. NSG to reconsult if any change in status while admitted. Menu provided to pt who began circling food/drink options for his meals for today as SLP left room.     Orinda Kenner, MS, CCC-SLP Speech Language Pathologist Rehab Services [phone] [facility] 05/11/2020, 11:35 AM

## 2020-05-11 NOTE — Progress Notes (Signed)
*  PRELIMINARY RESULTS* Echocardiogram 2D Echocardiogram has been performed.  [name] [name] 05/11/2020, 9:43 AM

## 2020-05-11 NOTE — Consult Note (Signed)
NEUROLOGY CONSULTATION NOTE   Date of service: May 11, 2020 Patient Name: [name] [name] MRN:  [id number] DOB:  [birth year] Reason for consult: "Left lower extremity weakness" _ _ _   _ __   _ __ _ _  __ __   _ __   __ _  History of Present Illness  [name] [name] is a 74 y.o. male with PMH significant for CAD, CHF, MI, reflux disease, diabetes, neuropathy, prior stroke with left sided weakness, prior hx of apical cardiac thrombus who presents with acute left leg weakness and numbness.  He reports that he went to bed on 05/08/20 at 2100 and woke up at 0230 with Left leg weakness and numbness from thigh below. Attempted to get up but leg would not function. Somehow was able to drag himself and get his cane. Has had persistent numbness and weakness since.  Reports that in July 2020, had a stroke with L Arm and leg weakness was given tPA and immediately gained function in his arm but was able to get most of the function back in his leg after 3 months.  Denies any leg pain or spasm, no dysarthria, no aphasia, no arm weakness, no facial droop. He takes Aggrenox at home and has been compliant.   ROS   Constitutional Denies weight loss, fever and chills.   HEENT Denies changes in vision and hearing.   Respiratory Denies SOB and cough.   CV Denies palpitations and CP   GI Denies abdominal pain, nausea, vomiting and diarrhea.   GU Denies dysuria and urinary frequency.   MSK Denies myalgia and joint pain.   Skin Denies rash and pruritus.   Neurological Denies headache and syncope.   Psychiatric Denies recent changes in mood. Denies anxiety and depression.    Past History   Past Medical History:  Diagnosis Date  . Anginal pain (HCC)   . Arthritis   . Back pain    degenerative disc disease  . Carpal tunnel syndrome of right wrist   . CHF (congestive heart failure) (HCC)   . Coronary artery disease   . Diverticulosis   . GERD (gastroesophageal reflux disease)   . Myocardial  infarction (HCC)   . Neuropathy   . Stroke (HCC)   . Type 2 diabetes mellitus with complication, without long-term current use of insulin (HCC) 05/10/2020  . Wears dentures    full upper and lower  . Wears hearing aid    bilateral   Past Surgical History:  Procedure Laterality Date  . ANKLE RECONSTRUCTION Left 1991  . APPENDECTOMY    . BACK SURGERY N/A    lower back x 3  . CARDIAC CATHETERIZATION    . CARPAL TUNNEL RELEASE Left 03/2014   In Dr. Rosita Kea office  . CARPAL TUNNEL RELEASE Left 10/20/2014   Procedure: CARPAL TUNNEL RELEASE;  Surgeon: Kennedy Bucker, MD;  Location: [facility] OR[facility];  Service: Orthopedics;  Laterality: Left;  . CORONARY ARTERY BYPASS GRAFT  March 2014   [facility]  . ESOPHAGOGASTRODUODENOSCOPY (EGD) WITH PROPOFOL N/A 10/26/2015   Procedure: ESOPHAGOGASTRODUODENOSCOPY (EGD) WITH PROPOFOL with dialation;  Surgeon: [name], MD;  Location: [facility];  Service: Endoscopy;  Laterality: N/A;  . EYE SURGERY Bilateral    Cataract Extraction with IOL  . LUMBAR LAMINECTOMY/DECOMPRESSION MICRODISCECTOMY Bilateral 05/06/2018   Procedure: Bilateral Lumbar Four-Five Laminectomy/Foraminotomy;  Surgeon: Barnett Abu, MD;  Location: [facility];  Service: Neurosurgery;  Laterality: Bilateral;  Bilateral Lumbar 4-5 Laminectomy/Foraminotomy  . NECK SURGERY  N/A    neck x 2, one discectomy and one shaved disc.  Marland Kitchen SHOULDER ARTHROSCOPY Right 06/06/2015   Procedure: right shoulder arthroscopy, arthroscopic debridement, subacromial decompression, SLAP repair, biotenodesis, mini open rotator cuff repair;  Surgeon: Christena Flake, MD;  Location: [facility];  Service: Orthopedics;  Laterality: Right;  . SHOULDER OPEN ROTATOR CUFF REPAIR     Left   Family History  Problem Relation Age of Onset  . Lung cancer Father   . Diabetes Mellitus II Sister   . Diverticulitis Sister    Social History   Socioeconomic History  . Marital status: Married    Spouse name: Not on file  . Number of  children: Not on file  . Years of education: Not on file  . Highest education level: Not on file  Occupational History  . Not on file  Tobacco Use  . Smoking status: Former Smoker    Quit date: 06/18/2013    Years since quitting: 6.9  . Smokeless tobacco: Never Used  Vaping Use  . Vaping Use: Never used  Substance and Sexual Activity  . Alcohol use: Yes    Alcohol/week: 0.0 - 1.0 standard drinks  . Drug use: No  . Sexual activity: Yes  Other Topics Concern  . Not on file  Social History Narrative  . Not on file   Social Determinants of Health   Financial Resource Strain: Not on file  Food Insecurity: Not on file  Transportation Needs: Not on file  Physical Activity: Not on file  Stress: Not on file  Social Connections: Not on file   Allergies  Allergen Reactions  . Parafon Forte Dsc [Chlorzoxazone] Itching and Rash    severe  . Statins Other (See Comments)    Muscle pain  . Cucumber Extract Other (See Comments)    GI upset  . Plavix [Clopidogrel Bisulfate] Rash    Medications  (Not in a hospital admission)    Vitals   Vitals:   05/11/20 0900 05/11/20 0930 05/11/20 1000 05/11/20 1030  BP: (!) 143/73 (!) 149/66 134/67 (!) 115/56  Pulse: (!) 50 (!) 48 (!) 50 (!) 51  Resp: 11 18 13 11   Temp:  (!) 97.5 F (36.4 C)    TempSrc:  Oral    SpO2: 97% 99% 100% 96%  Weight:      Height:         Body mass index is 32.75 kg/m.  Physical Exam   General: Laying comfortably in bed; in no acute distress.  HENT: Normal oropharynx and mucosa. Normal external appearance of ears and nose.  Neck: Supple, no pain or tenderness  CV: No JVD. No peripheral edema.  Pulmonary: Symmetric Chest rise. Normal respiratory effort.  Abdomen: Soft to touch, non-tender.  Ext: No cyanosis, edema, or deformity  Skin: No rash. Normal palpation of skin.   Musculoskeletal: Normal digits and nails by inspection. No clubbing.   Neurologic Examination  Mental status/Cognition: Alert,  oriented to self, place, month and year, good attention.  Speech/language: Fluent, comprehension intact, object naming intact, repetition intact.  Cranial nerves:   CN II Pupils equal and reactive to light, no VF deficits    CN III,IV,VI EOM intact, no gaze preference or deviation, no nystagmus    CN V normal sensation in V1, V2, and V3 segments bilaterally    CN VII no asymmetry, no nasolabial fold flattening    CN VIII normal hearing to speech    CN IX & X normal palatal  elevation, no uvular deviation    CN XI 5/5 head turn and 5/5 shoulder shrug bilaterally    CN XII midline tongue protrusion    Motor:  Muscle bulk: normal, tone normal, pronator drift: none  tremor none. Mvmt Root Nerve  Muscle Right Left Comments  SA C5/6 Ax Deltoid 5 5   EF C5/6 Mc Biceps 5 5   EE C6/7/8 Rad Triceps 5 5   WF C6/7 Med FCR 5 5   WE C7/8 PIN ECU 5 5   F Ab C8/T1 U ADM/FDI 5 5   HF L1/2/3 Fem Illopsoas 5 3   KE L2/3/4 Fem Quad 5 2   DF L4/5 D Peron Tib Ant 5 1   PF S1/2 Tibial Grc/Sol 5 1    Reflexes:  Right Left Comments  Pectoralis      Biceps (C5/6) 1 1   Brachioradialis (C5/6) 1 1    Triceps (C6/7) 1 1    Patellar (L3/4) 1 1    Achilles (S1)      Hoffman      Plantar     Jaw jerk    Sensation:  Light touch Decreased in entire LLE from thigh to foot in a non dermatomal distribution   Pin prick Decreased in entire LLE from thigh to foot in a non dermatomal distribution   Temperature    Vibration   Proprioception    Coordination/Complex Motor:  - Finger to Nose intact BL - Heel to shin unableto assess on the left - Rapid alternating movement are normal  Labs   CBC:  Recent Labs  Lab 05/10/20 1604  WBC 7.3  NEUTROABS 3.0  HGB 14.8  HCT 41.2  MCV 90.0  PLT 208    Basic Metabolic Panel:  Lab Results  Component Value Date   NA 138 05/10/2020   K 3.7 05/10/2020   CO2 26 05/10/2020   GLUCOSE 161 (H) 05/10/2020   BUN 16 05/10/2020   CREATININE 0.71 05/10/2020    CALCIUM 9.3 05/10/2020   GFRNONAA >60 05/10/2020   GFRAA >60 09/13/2019   Lipid Panel:  Lab Results  Component Value Date   LDLCALC 30 05/11/2020   HgbA1c:  Lab Results  Component Value Date   HGBA1C 6.3 (H) 05/11/2020   Urine Drug Screen: No results found for: LABOPIA, COCAINSCRNUR, LABBENZ, AMPHETMU, THCU, LABBARB  Alcohol Level     Component Value Date/Time   ETH <10 11/09/2017 1719    CT Head without contrast: CTH was negative for a large hypodensity concerning for a large territory infarct or hyperdensity concerning for an ICH  CT angio Head and Neck with contrast: No LVO, no significant atherosclerosis  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 male with PMH significant for CAD, CHF, MI, reflux disease, diabetes, neuropathy, prior stroke with left sided weakness, prior hx of apical cardiac thrombus who presents with acute left leg weakness and numbness. His neurologic examination is notable for non dernatomal patten of weakness and numbness in the LLE. With his prior history of strokes, findings are clinically concerning for a small lacunar infarct either in the basal ganglia or internal capsule.  Recommendations  Plan: - Frequent Neuro checks per stroke unit protocol - TTE with EF of 55-60% - Last LDL of 30, continue home Zetia - LastHbA1c of 6.3. - Antithrombotic - Continue home Aggrenox - Recommend DVT ppx - SBP goal - permissive hypertension first 24  h < 220/110. Held home meds.  - Recommend Telemetry monitoring for arrythmia - Recommend bedside swallow screen prior to PO intake. - Stroke education booklet - Recommend PT/OT/SLP consult - Agree with repeat CTH tomorrow AM to evaluate for stroke. ______________________________________________________________________   Thank you for the opportunity to take part in the care of this patient. If you have any further questions, please contact the neurology consultation  attending.  Signed,  [name] [name] [facility] Pager Number [phone] _ _ _   _ __   _ __ _ _  __ __   _ __   __ _

## 2020-05-11 NOTE — ED Notes (Signed)
Pt assisted oob to commode in room- 1 person assist while pt using cane -- pt reports he needs to have BM.  Pt noted to drag LLE and reports ongoing numbness and weakness.  Reports ongoing chronic back pain - no acute pain reported at this time.

## 2020-05-11 NOTE — Progress Notes (Signed)
PT Cancellation Note  Patient Details Name: [name] [name] MRN: [id number] DOB: 21-Jan-1946   Cancelled Treatment:      Consult received, chart reviewed. Imaging pending, neurology consult pending. Will re-attempt at later date/time as appropriate.   [name] [name] 05/11/2020, 12:37 PM  Elizabeth Palau, PT, DPT [phone]

## 2020-05-11 NOTE — Plan of Care (Signed)
°  Problem: Education: °Goal: Knowledge of General Education information will improve °Description: Including pain rating scale, medication(s)/side effects and non-pharmacologic comfort measures °Outcome: Progressing °  °Problem: Nutrition: °Goal: Adequate nutrition will be maintained °Outcome: Progressing °  °Problem: Skin Integrity: °Goal: Risk for impaired skin integrity will decrease °Outcome: Progressing °  °

## 2020-05-12 ENCOUNTER — Observation Stay: Payer: Medicare Other

## 2020-05-12 DIAGNOSIS — I639 Cerebral infarction, unspecified: Secondary | ICD-10-CM | POA: Diagnosis not present

## 2020-05-12 LAB — GLUCOSE, CAPILLARY
Glucose-Capillary: 112 mg/dL — ABNORMAL HIGH (ref 70–99)
Glucose-Capillary: 135 mg/dL — ABNORMAL HIGH (ref 70–99)

## 2020-05-12 MED ORDER — ISOSORBIDE MONONITRATE ER 30 MG PO TB24
30.0000 mg | ORAL_TABLET | Freq: Every day | ORAL | Status: DC
  Administered 2020-05-12: 30 mg via ORAL
  Filled 2020-05-12: qty 1

## 2020-05-12 MED ORDER — ASPIRIN-DIPYRIDAMOLE ER 25-200 MG PO CP12
1.0000 | ORAL_CAPSULE | Freq: Two times a day (BID) | ORAL | Status: DC
  Administered 2020-05-12: 1 via ORAL
  Filled 2020-05-12 (×2): qty 1

## 2020-05-12 MED ORDER — EZETIMIBE 10 MG PO TABS
10.0000 mg | ORAL_TABLET | Freq: Every day | ORAL | Status: DC
  Administered 2020-05-12: 10 mg via ORAL
  Filled 2020-05-12: qty 1

## 2020-05-12 MED ORDER — GABAPENTIN 300 MG PO CAPS
300.0000 mg | ORAL_CAPSULE | Freq: Two times a day (BID) | ORAL | Status: DC
  Administered 2020-05-12: 300 mg via ORAL
  Filled 2020-05-12: qty 1

## 2020-05-12 MED ORDER — DICYCLOMINE HCL 10 MG PO CAPS
10.0000 mg | ORAL_CAPSULE | Freq: Two times a day (BID) | ORAL | Status: DC
  Administered 2020-05-12: 10 mg via ORAL
  Filled 2020-05-12 (×2): qty 1

## 2020-05-12 MED ORDER — PANTOPRAZOLE SODIUM 40 MG PO TBEC
40.0000 mg | DELAYED_RELEASE_TABLET | Freq: Every morning | ORAL | Status: DC
  Administered 2020-05-12: 40 mg via ORAL
  Filled 2020-05-12: qty 1
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
## 2020-05-12 NOTE — Plan of Care (Signed)
  Problem: Education: Goal: Knowledge of General Education information will improve Description: Including pain rating scale, medication(s)/side effects and non-pharmacologic comfort measures Outcome: Completed/Met

## 2020-05-12 NOTE — TOC Initial Note (Addendum)
Transition of Care [facility]) - Initial/Assessment Note    Patient Details  Name: [name] [name] MRN: [id number] Date of Birth: 1946/04/29  Transition of Care [facility]) CM/SW Contact:    [place] Cellar, RN Phone Number: 05/12/2020, 4:58 PM  Clinical Narrative:                 Notified patient was in need of HHC services and wife was en route to pick him up but did not want to wait.  TOC RN LVMM for wife to discuss HHC options and potential past experiences with agencies due to insurance.   Will attempt to set up [facility] agencies after discharge. Notified bedside nurse patient could discharge prior to confirmation of HHC agencies.   Jason with [facility] unable to accept patient.  Rosey Bath with [facility] unable to accept patient.  Denyse Amass with [facility] unable to accept patient. Cala Bradford with [facility] unable to accept patient.   Waiting to hear from [facility] and [facility].   5:15pm: Received call from [facility], spouse states her and her husband always use [facility] and want to continue with them. Rose states she works during the day and can not do OP PT. Called and spoke to Ileene Rubens who states she will check with Data processing manager and ask for exceptions as they currently do not have any capacity for [facility] patients.        Patient Goals and CMS Choice        Expected Discharge Plan and Services           Expected Discharge Date: 05/12/20                                    Prior Living Arrangements/Services                       Activities of Daily Living Home Assistive Devices/Equipment: Gilmer Mor (specify quad or straight),Dentures (specify type),Hearing aid,Eyeglasses ADL Screening (condition at time of admission) Patient's cognitive ability adequate to safely complete daily activities?: Yes Is the patient deaf or have difficulty hearing?: Yes Does the patient have difficulty seeing, even when wearing glasses/contacts?: No Does the patient have difficulty  concentrating, remembering, or making decisions?: No Patient able to express need for assistance with ADLs?: Yes Does the patient have difficulty dressing or bathing?: No Independently performs ADLs?: Yes (appropriate for developmental age) Does the patient have difficulty walking or climbing stairs?: No Weakness of Legs: Left Weakness of Arms/Hands: None  Permission Sought/Granted                  Emotional Assessment              Admission diagnosis:  Stroke (cerebrum) (HCC) [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 History of CVA (cerebrovascular accident) 05/10/2020  . Type 2 diabetes mellitus with complication, without long-term current use of insulin (HCC) 05/10/2020  . Hx of decompressive lumbar laminectomy 05/10/2020  . Bilateral carotid artery stenosis 12/21/2018  . Stroke (HCC) 12/14/2018  . TIA (transient ischemic attack) 12/12/2018  . Paresis of left lower extremity (HCC)   . Pressure injury of skin 04/30/2018  . Acute CVA (cerebrovascular accident) (HCC) 04/29/2018  . Obesity (BMI 30.0-34.9) 11/11/2017  . Stroke-like episode (HCC) -  R brain, s/p tPA 11/09/2017  . PAD (peripheral artery disease) (HCC) 06/26/2016  . Pain in limb 06/04/2016  . Annual physical exam 05/31/2016  . Cerumen impaction 05/31/2016  . ERRONEOUS ENCOUNTER--DISREGARD 01/29/2016  . Difficulty in swallowing   . Hiatal hernia   . Gastritis   . Heartburn 10/11/2015  . Influenza-like illness 08/18/2015  . Sinusitis, acute 07/20/2015  . Elevation of level of transaminase and lactic acid dehydrogenase (LDH) 05/11/2015  . Back pain with radiation 05/11/2015  . Cardiomyopathy (HCC) 05/11/2015  . Esophageal lesion 05/11/2015  . Pain in the wrist 05/11/2015  . Low back pain with sciatica  05/11/2015  . Heart attack (HCC) 05/11/2015  . Absent peripheral pulse 05/11/2015  . ERRONEOUS ENCOUNTER--DISREGARD 04/26/2015  . Back pain, chronic 02/03/2015  . Leg swelling 02/03/2015  . Edema leg 02/03/2015  . CAD in native artery 02/03/2015  . Narrowing of intervertebral disc space 02/03/2015  . Acid reflux 02/03/2015  . Hypercholesteremia 02/03/2015  . BP (high blood pressure) 02/03/2015  . Cardiomyopathy, ischemic 02/03/2015  . Arthritis of hand, degenerative 02/03/2015  . Insomnia secondary to chronic pain 11/14/2014  . 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 Diuretic-induced hypokalemia 11/14/2014  . History of decompression of median nerve 10/03/2014  . Acquired polyneuropathy 09/28/2014  . Carpal tunnel syndrome 09/28/2014  . Peripheral neuropathy 09/28/2014  . Chest pain 10/20/2013  . Other specified cardiac arrhythmias 08/23/2013  . H/O coronary artery bypass surgery 08/21/2013  . Chronic low back pain with right-sided sciatica 08/19/2013  . Difficulty hearing 08/19/2013  . Arteriosclerosis of coronary artery 08/19/2013  . Esophageal lump 08/19/2013  . Hearing impaired 08/19/2013   PCP:  Barbette Reichmann, MD Pharmacy:   [facility], [place] - [address] MA[address] [place] [address] Phone: [phone] Fax: [phone]     Social Determinants of Health (SDOH) Interventions    Readmission Risk Interventions No flowsheet data found.

## 2020-05-12 NOTE — Progress Notes (Signed)
[facility]  Physical Therapy Certification  Patient Details  Name: [name] [name] MRN: [id number] Date of Birth: 1945/10/03 Medical Diagnosis: Principal Problem:   Acute CVA (cerebrovascular accident) [facility]) Active Problems:   CAD in native artery   H/O coronary artery bypass surgery   History of CVA (cerebrovascular accident)   Hearing impaired   Type 2 diabetes mellitus with complication, without long-term current use of insulin (HCC)   Hx of decompressive lumbar laminectomy  Visit Diagnosis: PT Visit Diagnosis: Muscle weakness (generalized) (M62.81),Difficulty in walking, not elsewhere classified (R26.2) PT Problem: Decreased range of motion,Decreased activity tolerance,Decreased balance,Decreased mobility,Decreased strength,Decreased knowledge of use of DME,Decreased safety awareness,Decreased knowledge of precautions  Goals: Pt will Transfer Bed to Chair/Chair to Bed: Independently (for access to seating surfaces in home environment) Pt will Ambulate: > 125 feet,Independently (for household/community mobilization upon discharge) Pt will Go Up / Down Stairs: 3-5 stairs,with modified independence,with rail(s) (for entry/exit of home upon discharge)  Duration: Services will be provided through the following date: 05/26/20  Frequency: Min 2X/week  Amount: one treatment session per day unless otherwise indicated.    Certification Start Date: 05/12/2020 Certification End Date: 05/26/20   PT Treatments/Interventions: DME instruction,Gait training,Stair training,Functional mobility training,Therapeutic activities,Patient/family education,Neuromuscular re-education,Balance training,Therapeutic exercise  [employer]. Manson Passey, PT, DPT, NCS 05/12/20, 11:44 PM [phone]

## 2020-05-12 NOTE — Plan of Care (Signed)
Oxy given once over night for chronic back pain, see MAR. NIH unchanged from previous assessment. Pt stating that he has the slightest feeling in his L big toe but other than that still no feeling in his L leg. 1 Assist to bathroom, patient refusing to use urinal. No new changes. Will continue with the current plan of care.   Problem: Education: Goal: Knowledge of General Education information will improve Description: Including pain rating scale, medication(s)/side effects and non-pharmacologic comfort measures Outcome: Progressing   Problem: Health Behavior/Discharge Planning: Goal: Ability to manage health-related needs will improve Outcome: Progressing   Problem: Clinical Measurements: Goal: Ability to maintain clinical measurements within normal limits will improve Outcome: Progressing Goal: Will remain free from infection Outcome: Progressing Goal: Diagnostic test results will improve Outcome: Progressing Goal: Respiratory complications will improve Outcome: Progressing Goal: Cardiovascular complication will be avoided Outcome: Progressing   Problem: Activity: Goal: Risk for activity intolerance will decrease Outcome: Progressing   Problem: Nutrition: Goal: Adequate nutrition will be maintained Outcome: Progressing   Problem: Coping: Goal: Level of anxiety will decrease Outcome: Progressing   Problem: Elimination: Goal: Will not experience complications related to bowel motility Outcome: Progressing Goal: Will not experience complications related to urinary retention Outcome: Progressing   Problem: Pain Managment: Goal: General experience of comfort will improve Outcome: Progressing   Problem: Safety: Goal: Ability to remain free from injury will improve Outcome: Progressing   Problem: Skin Integrity: Goal: Risk for impaired skin integrity will decrease Outcome: Progressing

## 2020-05-15 NOTE — TOC Progression Note (Addendum)
Transition of Care [facility]) - Progression Note    Patient Details  Name: SHIHAB STATES MRN: [id number] Date of Birth: 10-01-1945  Transition of Care [facility]) CM/SW Contact  [place] Cellar, RN Phone Number: 05/15/2020, 11:02 AM  Clinical Narrative:    Late entry: 05/12/20 @ 6pm: Spoke to [place] who confirmed they had never seen this patient or his wife. RN CM contacted wife and wife states she misspoke and it was [place]. RN CM sent request to [facility] with update that patient was requesting [facility]. Denyse Amass requested DOB and address but no response after receiving information.   05/15/20: Outreached to [place] @ [facility] requesting follow up and Denyse Amass confirmed [facility] could accept patient however it would be later in the week before they were able to start.   RN CM outreached to wife and updated to expect call from [facility] to begin services later in the week.           Expected Discharge Plan and Services           Expected Discharge Date: 05/12/20                                     Social Determinants of Health (SDOH) Interventions    Readmission Risk Interventions No flowsheet data found.

## 2021-05-07 ENCOUNTER — Other Ambulatory Visit: Payer: Self-pay | Admitting: Neurology

## 2021-05-07 DIAGNOSIS — I639 Cerebral infarction, unspecified: Secondary | ICD-10-CM

## 2021-07-20 ENCOUNTER — Other Ambulatory Visit: Payer: Self-pay | Admitting: Orthopedic Surgery

## 2021-07-24 ENCOUNTER — Other Ambulatory Visit: Payer: Self-pay

## 2021-07-24 ENCOUNTER — Encounter
Admission: RE | Admit: 2021-07-24 | Discharge: 2021-07-24 | Disposition: A | Discharge status: Home / Self Care | Payer: Medicare HMO | Source: Ambulatory Visit | Attending: Orthopedic Surgery | Admitting: Orthopedic Surgery

## 2021-07-24 NOTE — Patient Instructions (Signed)
Your procedure is scheduled on: 08/07/21 Report to DAY SURGERY DEPARTMENT LOCATED ON 2ND FLOOR MEDICAL MALL ENTRANCE. To find out your arrival time please call [phone] between 1PM - 3PM on 08/06/21.  Remember: Instructions that are not followed completely may result in serious medical risk, up to and including death, or upon the discretion of your surgeon and anesthesiologist your surgery may need to be rescheduled.     _X__ 1. Do not eat food after midnight the night before your procedure.                 No gum chewing or hard candies. , (Do not add                 anything to coffee or tea). Diabetics water only You may have black coffee (Do not add                 anything to coffee)  __X__2.  On the morning of surgery brush your teeth with toothpaste and water, you                 may rinse your mouth with mouthwash if you wish.  Do not swallow any              toothpaste of mouthwash.     _X__ 3.  No Alcohol for 24 hours before or after surgery.   _X__ 4.  Do Not Smoke or use e-cigarettes For 24 Hours Prior to Your Surgery.                 Do not use any chewable tobacco products for at least 6 hours prior to                 surgery.  ____  5.  Bring all medications with you on the day of surgery if instructed.   __X__  6.  Notify your doctor if there is any change in your medical condition      (cold, fever, infections).     Do not wear jewelry, make-up, hairpins, clips or nail polish. Do not wear lotions, powders, or perfumes.  Do not shave body hair 48 hours prior to surgery. Men may shave face and neck. Do not bring valuables to the hospital.    [facility] is not responsible for any belongings or valuables.  Contacts, dentures/partials or body piercings may not be worn into surgery. Bring a case for your contacts, glasses or hearing aids, a denture cup will be supplied. Leave your suitcase in the car. After surgery it may be brought to your room. For patients admitted  to the hospital, discharge time is determined by your treatment team.   Patients discharged the day of surgery will not be allowed to drive home.   Please read over the following fact sheets that you were given:   MRSA Information  __X__ Take these medicines the morning of surgery with A SIP OF WATER:    1. isosorbide mononitrate (IMDUR) 30 MG 24 hr tablet  2. gabapentin (NEURONTIN) 300 MG capsule  3. dicyclomine (BENTYL) 10 MG capsule  4. pantoprazole (PROTONIX) 40 MG tablet  5. Oxycodone HCl 10 MG TABS if needed only  6.  ____ Fleet Enema (as directed)   __X__ Use CHG Soap/SAGE wipes as directed  ____ Use inhalers on the day of surgery  ____ Stop metformin/Janumet/Farxiga 2 days prior to surgery    ____ Take 1/2 of usual insulin dose the  night before surgery. No insulin the morning          of surgery.   ____ Stop Blood Thinners Coumadin/Plavix/Xarelto/Pleta/Pradaxa/Eliquis/Effient/Aspirin  on   Or contact your Surgeon, Cardiologist or Medical Doctor regarding  ability to stop your blood thinners  __X__ Stop Anti-inflammatories 7 days before surgery such as Advil, Ibuprofen, Motrin,  BC or Goodies Powder, Naprosyn, Naproxen, Aleve, Aspirin   May take Tylenol  __X__ Stop all herbals and supplements, fish oil or vitamins  until after surgery.    ____ Bring C-Pap to the hospital.

## 2021-07-25 ENCOUNTER — Other Ambulatory Visit
Admission: RE | Admit: 2021-07-25 | Discharge: 2021-07-25 | Disposition: A | Discharge status: Home / Self Care | Payer: Medicare HMO | Source: Ambulatory Visit | Attending: Orthopedic Surgery | Admitting: Orthopedic Surgery

## 2021-07-25 DIAGNOSIS — Z01810 Encounter for preprocedural cardiovascular examination: Secondary | ICD-10-CM | POA: Diagnosis present

## 2021-08-03 ENCOUNTER — Encounter: Payer: Self-pay | Admitting: Orthopedic Surgery

## 2021-08-03 NOTE — Progress Notes (Signed)
Perioperative Services  Pre-Admission/Anesthesia Testing Clinical Review  Date: 08/06/21  Patient Demographics:  Name: [name] [name] DOB:   Sep 14, 1945 MRN:   [id number]  Planned Surgical Procedure(s):    Case: [id number] Date/Time: 08/07/21 0850   Procedures:      Right knee arthroscopy with partial medial meniscectomy and partial lateral meniscectomy with possible loose body excision (Right: Knee)     Right knee arthroscopy with partial medial meniscectomy and partial lateral meniscectomy with possible loose body excision (Right: Knee)   Anesthesia type: Choice   Pre-op diagnosis:      Patellofemoral pain syndrome of right knee  M22.2X1     Primary osteoarthritis of right knee  M17.11     Mechanical knee pain, right  M25.561   Location: [facility] OR ROOM 01 / [place] [facility]S FOR ANESTHESIA [facility]   Surgeons: Hessie Knows, MD   NOTE: Available PAT nursing documentation and vital signs have been reviewed. Clinical nursing staff has updated patient's PMH/PSHx, current medication list, and drug allergies/intolerances to ensure comprehensive history available to assist in medical decision making as it pertains to the aforementioned surgical procedure and anticipated anesthetic course. Extensive review of available clinical information performed. [place] PMH and PSHx updated with any diagnoses/procedures that  may have been inadvertently omitted during his intake with the pre-admission testing department's nursing staff.  Clinical Discussion:  [name] [name] is a 76 y.o. male who is submitted for pre-surgical anesthesia review and clearance prior to him undergoing the above procedure. Patient is a Former Smoker (quit 06/2013). Pertinent PMH includes: CAD (s/p CABG), NSTEMI, CHF, angina, mural thrombus of cardiac apex, lacunar infarct, PAD, HTN, HLD, T2DM, GERD (on daily PPI), hiatal hernia, OA, lumbar DDD, lumbar radiculopathy, neuropathy.  Patient is followed by cardiology Nehemiah Massed, MD). He was  last seen in the cardiology clinic on 08/06/2021; notes reviewed.  At the time of his clinic visit, patient doing well overall from a cardiovascular perspective.  He denied any chest pain, however continue to report episodes of exertional dyspnea.  He also complained of the pain.  Patient denied any PND, orthopnea, palpitations, significant edema, vertiginous symptoms, or presyncope/syncope. Patient's past medical history significant for cardiovascular diagnoses.  Patient suffered an NSTEMI on 08/09/2009.  Diagnostic left heart catheterization revealed multivessel CAD; 30% proximal LAD, 100% mid LAD, 30% mid LCx, 70% OM1, 20% proximal RCA, 20% mid RCA, 30% distal RCA and 100% RPDA.  Intervention was deferred opting for aggressive medical management.  Repeat diagnostic heart catheterization was performed on 08/11/2013 revealing multivessel CAD; 100% mid LAD, 90% proximal LCx, 75% OM1, and 100% RPDA.  Patient was referred to CVTS for consideration of CABG procedure.  Patient underwent a 5 vessel CABG on 08/20/2013.  LIMA-LAD, sequential SVG-diagonal-OM and SVG-RV branch-PDA bypass grafts were placed.  TTE performed on 11/10/2017 revealed normal left ventricular systolic function with an EF of 55 to 60%.  There was trivial mitral valve regurgitation.  Global hypokinesis noted.  Diastolic Doppler parameters consistent with impaired relaxation (G1DD).  Also there was a small left ventricular apex thrombus noted measuring 10 x 10 mm.  Myocardial perfusion imaging study performed on 08/03/2021 revealed a reduced left ventricular systolic function with an EF of 45%.  There was a small fixed perfusion abnormality present in the anterior myocardium on stress images as well as the resting images consistent with artifact versus the possibility of a minimal apical scar.  Last TTE was performed on 08/03/2021 revealing a normal left ventricular systolic function with  an EF of >55%.  Diastolic Doppler parameters  consistent with impaired relaxation (G1DD).  There was trivial MR/TR and mild PR.  There was no evidence of a significant transvalvular gradient to suggest mitral or aortic valve stenosis.  There was no pericardial effusion.  Blood pressure well controlled at 118/60 on currently prescribed nitrate monotherapy.  Patient is on a PCSK9i (evolocumab) + ezetimibe for his HLD and further ASCVD prevention.  Following his CABG procedure, patient is on daily dipyridamole-aspirin (Aggrenox); compliant with therapy with no evidence or reports of GI bleeding.  T2DM reasonably controlled on currently prescribed regimen; last HgbA1c was 7.6% when checked on 07/04/2021. Functional capacity, as defined by DASI, is documented as being </= 4 METS. No changes were made to patient's medication regimen.  Patient to follow-up with outpatient cardiology in 9 months or sooner if needed.  [name] [name] is scheduled for an RIGHT KNEE ARTHROSCOPY WITH PARTIAL MEDIAL MENISCECTOMY AND PARTIAL LATERAL MENISCECTOMY WITH POSSIBLE LOOSE BODY EXCISION on 08/07/2021 with Dr. Hessie Knows, MD.  Given patient's past medical history significant for cardiovascular diagnoses, presurgical cardiac clearance was sought by the PAT team. Per cardiology, "this patient is optimized for surgery and may proceed with the planned procedural course with a LOW risk of significant perioperative cardiovascular complications".  Again, this patient is on daily antiplatelet therapy.  He has been instructed on recommendations from his cardiologist for holding his dipyridamole-aspirin for 4 days prior to his procedure with plans to restart as soon as postoperatively risk felt to be minimized by his primary attending surgeon.  Patient is aware that his last dose of this medication should be on 08/02/2021.  Patient denies previous perioperative complications with anesthesia in the past. In review of the available records, it is noted that patient underwent a general  anesthetic course at [facility] ([facility] in 05/2018 without documented complications.   Vitals with BMI 07/24/2021 05/12/2020 05/12/2020  Height 5' 8" - -  Weight 197 lbs - -  BMI 57.50 - -  Systolic - 518 335  Diastolic - 65 69  Pulse - 45 44    Providers/Specialists:   NOTE: Primary physician provider listed below. Patient may have been seen by APP or partner within same practice.   PROVIDER ROLE / SPECIALTY LAST Fabio Bering, MD Orthopedics (Surgeon) 07/18/2021  Tracie Harrier, MD Primary Care Provider 07/10/2021  Serafina Royals, MD Cardiology 08/06/2021   Allergies:  Parafon forte dsc [chlorzoxazone], Statins, Cucumber extract, and Plavix [clopidogrel bisulfate]  Current Home Medications:   No current facility-administered medications for this encounter.    acetaminophen (TYLENOL) 500 MG tablet   cyclobenzaprine (FLEXERIL) 10 MG tablet   dicyclomine (BENTYL) 10 MG capsule   dipyridamole-aspirin (AGGRENOX) 200-25 MG 12hr capsule   docusate sodium (COLACE) 100 MG capsule   Evolocumab (REPATHA SURECLICK) 825 MG/ML SOAJ   ezetimibe (ZETIA) 10 MG tablet   gabapentin (NEURONTIN) 300 MG capsule   glimepiride (AMARYL) 2 MG tablet   isosorbide mononitrate (IMDUR) 30 MG 24 hr tablet   Multiple Vitamins-Minerals (MULTIVITAMIN WITH MINERALS) tablet   Oxycodone HCl 10 MG TABS   pantoprazole (PROTONIX) 40 MG tablet   History:   Past Medical History:  Diagnosis Date   Anginal pain (HCC)    Arthritis    Carpal tunnel syndrome of right wrist    CHF (congestive heart failure) ([place])    a.)  TTE 05/08/2016: EF 55%; trivial MR, mild TR/PR; G1DD.  b.)  TTE 11/10/2017: EF  55 to 60%; trivial MR; apical HK; G1DD. c.)  TTE 12/13/2018: EF 50-55%; periapical HK; mild LA enlargement. d.)  TTE 05/11/2020 EF 50 to 60%; mild MR. e.)  TTE 08/03/2021: EF >55%; triv MR/TR, mild PR; G1DD   Coronary artery disease 08/09/2009   a.) NSTEMI 08/09/2009 --> LHC: 30% pLAD, 100% mLAD,  30% mLCx, 70% OM1, 20% pRCA, 20% mRCA, 30% dRCA, 100% RPDA; no intervention. b.) LHC 08/11/2013: 100% mLAD, 90% pLCx, 75% OM1, 100% RPDA; refer to CVTS. c.) 5v CABG 08/20/2013   DDD (degenerative disc disease), lumbar    Diverticulosis    Full dentures    GERD (gastroesophageal reflux disease)    Hiatal hernia    HLD (hyperlipidemia)    HTN (hypertension)    Lacunar infarct, acute ([place]) 05/10/2020   Long term current use of antithrombotics/antiplatelets    a.) dipyridamole-aspirin   Lumbar radiculopathy    Mural thrombus of cardiac apex 11/10/2017   a.) TTE 11/10/2017: small LV apex thrombus measuring 10 x 10 mm   Neuropathy    NSTEMI (non-ST elevated myocardial infarction) ([place]) 08/09/2009   a.) LHC 08/09/2009: 30% pLAD, 100% mLAD, 30% mLCx, 70% OM1, 20% pRCA, 20% mRCA, 30% dRCA, 100% RPDA; intervention deferred opting for aggressive medical management.   PAD (peripheral artery disease) (HCC)    S/P CABG x 5 08/20/2013   a.)  LIMA-LAD, SVG-diagonal-OM (sequential), SVG-RV branch-PDA (sequential)   Senile cataract    Type 2 diabetes mellitus with complication, without long-term current use of insulin ([place]) 05/10/2020   Wears hearing aid in both ears    Past Surgical History:  Procedure Laterality Date   ANKLE RECONSTRUCTION Left 1991   APPENDECTOMY     BACK SURGERY N/A    lower back x 3   CARPAL TUNNEL RELEASE Left 03/2014   In Dr. Rudene Christians office   CARPAL TUNNEL RELEASE Left 10/20/2014   Procedure: CARPAL TUNNEL RELEASE;  Surgeon: Hessie Knows, MD;  Location: [facility] ORS;  Service: Orthopedics;  Laterality: Left;   CATARACT EXTRACTION W/ INTRAOCULAR LENS IMPLANT Bilateral    CORONARY ARTERY BYPASS GRAFT N/A 08/20/2013   Procedure: 5v CORONARY ARTERY BYPASS GRAFT; Location: [place]; Surgeon: Mirna Mires, MD   ESOPHAGOGASTRODUODENOSCOPY (EGD) WITH PROPOFOL N/A 10/26/2015   Procedure: ESOPHAGOGASTRODUODENOSCOPY (EGD) WITH PROPOFOL with dialation;  Surgeon:  Lucilla Lame, MD;  Location: [place];  Service: Endoscopy;  Laterality: N/A;   LEFT HEART CATH AND CORONARY ANGIOGRAPHY Left 08/09/2009   Procedure: LEFT HEART CATH AND CORONARY ANGIOGRAPHY; Location: [place]; Surgeon: Serafina Royals, MD   LEFT HEART CATH AND CORONARY ANGIOGRAPHY Left 08/11/2013   Procedure: LEFT HEART CATH AND CORONARY ANGIOGRAPHY; Location: [place]; Surgeon: Bartholome Bill, MD   LUMBAR LAMINECTOMY/DECOMPRESSION MICRODISCECTOMY Bilateral 05/06/2018   Procedure: Bilateral Lumbar Four-Five Laminectomy/Foraminotomy;  Surgeon: Kristeen Miss, MD;  Location: [place];  Service: Neurosurgery;  Laterality: Bilateral;  Bilateral Lumbar 4-5 Laminectomy/Foraminotomy   NECK SURGERY N/A    neck x 2, one discectomy and one shaved disc.   SHOULDER ARTHROSCOPY Right 06/06/2015   Procedure: right shoulder arthroscopy, arthroscopic debridement, subacromial decompression, SLAP repair, biotenodesis, mini open rotator cuff repair;  Surgeon: Corky Mull, MD;  Location: [facility] ORS;  Service: Orthopedics;  Laterality: Right;   SHOULDER OPEN ROTATOR CUFF REPAIR Left    Family History  Problem Relation Age of Onset   Lung cancer Father    Diabetes Mellitus II Sister    Diverticulitis Sister    Social History   Tobacco Use  Smoking status: Former    Types: Cigarettes    Quit date: 06/18/2013    Years since quitting: 8.1   Smokeless tobacco: Never  Vaping Use   Vaping Use: Never used  Substance Use Topics   Alcohol use: Yes    Alcohol/week: 0.0 - 1.0 standard drinks   Drug use: No    Pertinent Clinical Results:  LABS: Labs reviewed: Acceptable for surgery.   Ref Range & Units 07/04/2021  WBC (White Blood Cell Count) 4.1 - 10.2 103/uL 6.0   RBC (Red Blood Cell Count) 4.69 - 6.13 106/uL 4.73   Hemoglobin 14.1 - 18.1 gm/dL 14.9   Hematocrit 40.0 - 52.0 % 42.3   MCV (Mean Corpuscular Volume) 80.0 - 100.0 fl 89.4   MCH (Mean Corpuscular Hemoglobin) 27.0 - 31.2 pg 31.5 High    MCHC  (Mean Corpuscular Hemoglobin Concentration) 32.0 - 36.0 gm/dL 35.2   Platelet Count 150 - 450 103/uL 227   RDW-CV (Red Cell Distribution Width) 11.6 - 14.8 % 12.7   MPV (Mean Platelet Volume) 9.4 - 12.4 fl 9.7   Neutrophils 1.50 - 7.80 103/uL 2.35   Lymphocytes 1.00 - 3.60 103/uL 2.68   Monocytes 0.00 - 1.50 103/uL 0.68   Eosinophils 0.00 - 0.55 103/uL 0.20   Basophils 0.00 - 0.09 103/uL 0.05   Neutrophil % 32.0 - 70.0 % 39.2   Lymphocyte % 10.0 - 50.0 % 44.7   Monocyte % 4.0 - 13.0 % 11.3   Eosinophil % 1.0 - 5.0 % 3.3   Basophil% 0.0 - 2.0 % 0.8   Immature Granulocyte % <=0.7 % 0.7   Immature Granulocyte Count <=0.06 10^3/L 0.04   Resulting Agency  [facility]  Specimen Collected: 07/04/21 10:33 Last Resulted: 07/04/21 13:23  Received From: [facility]  Result Received: 07/20/21 12:45    Ref Range & Units 07/04/2021  Glucose 70 - 110 mg/dL 105   Sodium 136 - 145 mmol/L 141   Potassium 3.6 - 5.1 mmol/L 4.5   Chloride 97 - 109 mmol/L 103   Carbon Dioxide (CO2) 22.0 - 32.0 mmol/L 28.6   Urea Nitrogen (BUN) 7 - 25 mg/dL 10   Creatinine 0.7 - 1.3 mg/dL 0.8   Glomerular Filtration Rate (eGFR), MDRD Estimate >60 mL/min/1.73sq m 95   Calcium 8.7 - 10.3 mg/dL 9.4   AST  8 - 39 U/L 34   ALT  6 - 57 U/L 31   Alk Phos (alkaline Phosphatase) 34 - 104 U/L 104   Albumin 3.5 - 4.8 g/dL 4.0   Bilirubin, Total 0.3 - 1.2 mg/dL 1.0   Protein, Total 6.1 - 7.9 g/dL 6.7   A/G Ratio 1.0 - 5.0 gm/dL 1.5   Resulting Agency  [facility]  Specimen Collected: 07/04/21 10:33 Last Resulted: 07/04/21 17:41  Received From: [facility]  Result Received: 07/20/21 12:45     ECG: Date: 07/25/2021 Time ECG obtained: 1013 AM Rate: 76 bpm Rhythm: normal sinus Axis (leads I and aVF): Normal Intervals: PR 76 ms. QRS 158 ms. QTc 468 ms. ST segment and T wave changes: Nonspecific ST abnormality.  QTc has lengthened.   Comparison:  Similar to previous tracing obtained on 05/10/2020   IMAGING / PROCEDURES: MYOCARDIAL PERFUSION IMAGING STUDY (LEXISCAN) performed on 08/03/2021 LVEF 45% Normal myocardial thickening and wall motion No artifact Left ventricular cavity size normal SPECT images demonstrate a small fixed perfusion abnormality of mild intensity present in the anterior  myocardial region on stress images as well as resting images consistent with artifact versus the possibility of a minimal apical scar.  TRANSTHORACIC ECHOCARDIOGRAM performed on 08/03/2021 LVEF >42% Diastolic Doppler parameters consistent with impaired relaxation (G1DD) Right ventricle and adequately seen Trivial MR and TR Mild PR No AR Normal gradients; no valvular stenosis No pericardial effusion  CORONARY ARTERY BYPASS GRAFTING performed on 08/20/2013 5 vessel CABG procedure LIMA-LAD SVG-diagonal-OM (sequential) SVG-RV branch-PDA (sequential)  LEFT HEART CATHETERIZATION AND CORONARY ANGIOGRAPHY performed on 08/11/2013 Multivessel CAD 100% stenosis of the mid LAD 90% stenosis of the proximal LCx 75% stenosis of the OM1 100% stenosis of the RPDA Recommendations Refer to CVTS for consultation and consideration of CABG procedure  Impression and Plan:  [name] [name] has been referred for pre-anesthesia review and clearance prior to him undergoing the planned anesthetic and procedural courses. Available labs, pertinent testing, and imaging results were personally reviewed by me. This patient has been appropriately cleared by cardiology with an overall LOW risk of significant perioperative cardiovascular complications.  Based on clinical review performed today (08/06/21), barring any significant acute changes in the patient's overall condition, it is anticipated that he will be able to proceed with the planned surgical intervention. Any acute changes in clinical condition may necessitate his procedure being postponed and/or cancelled.  Patient will meet with anesthesia team (MD and/or CRNA) on the day of his procedure for preoperative evaluation/assessment. Questions regarding anesthetic course will be fielded at that time.   Pre-surgical instructions were reviewed with the patient during his PAT appointment and questions were fielded by PAT clinical staff. Patient was advised that if any questions or concerns arise prior to his procedure then he should return a call to PAT and/or his surgeon's office to discuss.  Honor Loh, MSN, APRN, FNP-C, CEN [facility]  Peri-operative Services Nurse Practitioner Phone: [phone] Fax: [phone] 08/06/21 4:09 PM  NOTE: This note has been prepared using Dragon dictation software. Despite my best ability to proofread, there is always the potential that unintentional transcriptional errors may still occur from this process.

## 2021-08-06 ENCOUNTER — Encounter: Payer: Self-pay | Admitting: Orthopedic Surgery

## 2021-08-07 ENCOUNTER — Encounter
Admission: RE | Disposition: A | Discharge status: Home / Self Care | Payer: Self-pay | Source: Home / Self Care | Attending: Orthopedic Surgery

## 2021-08-07 ENCOUNTER — Ambulatory Visit
Admission: RE | Admit: 2021-08-07 | Discharge: 2021-08-07 | Disposition: A | Discharge status: Home / Self Care | Payer: Medicare HMO | Attending: Orthopedic Surgery | Admitting: Orthopedic Surgery

## 2021-08-07 ENCOUNTER — Other Ambulatory Visit: Payer: Self-pay

## 2021-08-07 ENCOUNTER — Ambulatory Visit: Payer: Medicare HMO | Admitting: Urgent Care

## 2021-08-07 ENCOUNTER — Encounter: Payer: Self-pay | Admitting: Orthopedic Surgery

## 2021-08-07 DIAGNOSIS — M222X1 Patellofemoral disorders, right knee: Secondary | ICD-10-CM | POA: Insufficient documentation

## 2021-08-07 DIAGNOSIS — M1711 Unilateral primary osteoarthritis, right knee: Secondary | ICD-10-CM | POA: Diagnosis not present

## 2021-08-07 DIAGNOSIS — Z79899 Other long term (current) drug therapy: Secondary | ICD-10-CM | POA: Diagnosis not present

## 2021-08-07 DIAGNOSIS — S83281A Other tear of lateral meniscus, current injury, right knee, initial encounter: Secondary | ICD-10-CM | POA: Diagnosis not present

## 2021-08-07 DIAGNOSIS — E119 Type 2 diabetes mellitus without complications: Secondary | ICD-10-CM | POA: Insufficient documentation

## 2021-08-07 DIAGNOSIS — I110 Hypertensive heart disease with heart failure: Secondary | ICD-10-CM | POA: Diagnosis not present

## 2021-08-07 DIAGNOSIS — I2510 Atherosclerotic heart disease of native coronary artery without angina pectoris: Secondary | ICD-10-CM | POA: Insufficient documentation

## 2021-08-07 DIAGNOSIS — Z951 Presence of aortocoronary bypass graft: Secondary | ICD-10-CM | POA: Insufficient documentation

## 2021-08-07 DIAGNOSIS — S83241A Other tear of medial meniscus, current injury, right knee, initial encounter: Secondary | ICD-10-CM | POA: Diagnosis not present

## 2021-08-07 DIAGNOSIS — I252 Old myocardial infarction: Secondary | ICD-10-CM | POA: Insufficient documentation

## 2021-08-07 DIAGNOSIS — I509 Heart failure, unspecified: Secondary | ICD-10-CM | POA: Diagnosis not present

## 2021-08-07 DIAGNOSIS — K219 Gastro-esophageal reflux disease without esophagitis: Secondary | ICD-10-CM | POA: Insufficient documentation

## 2021-08-07 DIAGNOSIS — X58XXXA Exposure to other specified factors, initial encounter: Secondary | ICD-10-CM | POA: Insufficient documentation

## 2021-08-07 DIAGNOSIS — Z7902 Long term (current) use of antithrombotics/antiplatelets: Secondary | ICD-10-CM | POA: Diagnosis not present

## 2021-08-07 DIAGNOSIS — E785 Hyperlipidemia, unspecified: Secondary | ICD-10-CM | POA: Insufficient documentation

## 2021-08-07 DIAGNOSIS — Z7984 Long term (current) use of oral hypoglycemic drugs: Secondary | ICD-10-CM | POA: Insufficient documentation

## 2021-08-07 HISTORY — DX: Presence of external hearing-aid: Z97.4

## 2021-08-07 HISTORY — DX: Diaphragmatic hernia without obstruction or gangrene: K44.9

## 2021-08-07 HISTORY — DX: Peripheral vascular disease, unspecified: I73.9

## 2021-08-07 HISTORY — PX: KNEE ARTHROSCOPY WITH MEDIAL MENISECTOMY: SHX5651

## 2021-08-07 HISTORY — DX: Essential (primary) hypertension: I10

## 2021-08-07 HISTORY — DX: Hyperlipidemia, unspecified: E78.5

## 2021-08-07 HISTORY — DX: Complete loss of teeth, unspecified cause, unspecified class: Z97.2

## 2021-08-07 HISTORY — DX: Other intervertebral disc degeneration, lumbar region without mention of lumbar back pain or lower extremity pain: M51.369

## 2021-08-07 HISTORY — DX: Unspecified age-related cataract: H25.9

## 2021-08-07 HISTORY — DX: Long term (current) use of antithrombotics/antiplatelets: Z79.02

## 2021-08-07 HISTORY — DX: Complete loss of teeth, unspecified cause, unspecified class: K08.109

## 2021-08-07 HISTORY — DX: Radiculopathy, lumbar region: M54.16

## 2021-08-07 HISTORY — DX: Other intervertebral disc degeneration, lumbar region: M51.36

## 2021-08-07 LAB — GLUCOSE, CAPILLARY
Glucose-Capillary: 92 mg/dL (ref 70–99)
Glucose-Capillary: 111 mg/dL — ABNORMAL HIGH (ref 70–99)

## 2021-08-07 SURGERY — ARTHROSCOPY, KNEE, WITH MEDIAL MENISCECTOMY
Anesthesia: General | Site: Knee | Laterality: Right

## 2021-08-07 MED ORDER — ORAL CARE MOUTH RINSE: 15.0000 mL | Freq: Once | OROMUCOSAL | Status: DC

## 2021-08-07 MED ORDER — OXYCODONE HCL 5 MG/5ML PO SOLN: 5.0000 mg | Freq: Once | ORAL | Status: AC | PRN

## 2021-08-07 MED ORDER — PROPOFOL 10 MG/ML IV BOLUS
INTRAVENOUS | Status: AC
  Filled 2021-08-07: qty 20

## 2021-08-07 MED ORDER — OXYCODONE HCL 5 MG PO TABS
ORAL_TABLET | ORAL | Status: AC
  Filled 2021-08-07: qty 1

## 2021-08-07 MED ORDER — BUPIVACAINE-EPINEPHRINE (PF) 0.5% -1:200000 IJ SOLN
INTRAMUSCULAR | Status: AC
  Filled 2021-08-07: qty 30

## 2021-08-07 MED ORDER — ONDANSETRON HCL 4 MG/2ML IJ SOLN
INTRAMUSCULAR | Status: DC | PRN
  Administered 2021-08-07: 4 mg via INTRAVENOUS

## 2021-08-07 MED ORDER — FENTANYL CITRATE (PF) 100 MCG/2ML IJ SOLN
INTRAMUSCULAR | Status: DC | PRN
  Administered 2021-08-07 (×2): 50 ug via INTRAVENOUS

## 2021-08-07 MED ORDER — OXYCODONE HCL 5 MG PO TABS
5.0000 mg | ORAL_TABLET | Freq: Once | ORAL | Status: AC | PRN
  Administered 2021-08-07: 5 mg via ORAL

## 2021-08-07 MED ORDER — CEFAZOLIN SODIUM-DEXTROSE 2-4 GM/100ML-% IV SOLN
INTRAVENOUS | Status: AC
  Filled 2021-08-07: qty 100

## 2021-08-07 MED ORDER — CHLORHEXIDINE GLUCONATE 0.12 % MT SOLN
OROMUCOSAL | Status: AC
  Filled 2021-08-07: qty 15

## 2021-08-07 MED ORDER — FENTANYL CITRATE (PF) 100 MCG/2ML IJ SOLN
INTRAMUSCULAR | Status: AC
  Filled 2021-08-07: qty 2

## 2021-08-07 MED ORDER — BUPIVACAINE-EPINEPHRINE (PF) 0.5% -1:200000 IJ SOLN
INTRAMUSCULAR | Status: DC | PRN
  Administered 2021-08-07: 30 mL

## 2021-08-07 MED ORDER — LACTATED RINGERS IR SOLN
Status: DC | PRN
  Administered 2021-08-07 (×2): 3000 mL

## 2021-08-07 MED ORDER — PROPOFOL 10 MG/ML IV BOLUS
INTRAVENOUS | Status: DC | PRN
Start: 2021-08-07 — End: 2021-08-07
  Administered 2021-08-07: 80 mg via INTRAVENOUS

## 2021-08-07 MED ORDER — DEXAMETHASONE SODIUM PHOSPHATE 10 MG/ML IJ SOLN
INTRAMUSCULAR | Status: DC | PRN
  Administered 2021-08-07: 10 mg via INTRAVENOUS

## 2021-08-07 MED ORDER — LIDOCAINE HCL (CARDIAC) PF 100 MG/5ML IV SOSY
PREFILLED_SYRINGE | INTRAVENOUS | Status: DC | PRN
  Administered 2021-08-07: 100 mg via INTRAVENOUS

## 2021-08-07 MED ORDER — CHLORHEXIDINE GLUCONATE 0.12 % MT SOLN: 15.0000 mL | Freq: Once | OROMUCOSAL | Status: DC

## 2021-08-07 MED ORDER — CEFAZOLIN SODIUM-DEXTROSE 2-4 GM/100ML-% IV SOLN
2.0000 g | INTRAVENOUS | Status: AC
  Administered 2021-08-07: 2 g via INTRAVENOUS

## 2021-08-07 MED ORDER — OXYCODONE HCL 5 MG PO TABS
5.0000 mg | ORAL_TABLET | ORAL | 0 refills | Status: DC | PRN
Start: 2021-08-07 — End: 2022-05-16

## 2021-08-07 MED ORDER — MIDAZOLAM HCL 2 MG/2ML IJ SOLN
INTRAMUSCULAR | Status: AC
  Filled 2021-08-07: qty 2

## 2021-08-07 MED ORDER — SODIUM CHLORIDE 0.9 % IV SOLN: INTRAVENOUS | Status: DC

## 2021-08-07 MED ORDER — FENTANYL CITRATE (PF) 100 MCG/2ML IJ SOLN: 25.0000 ug | INTRAMUSCULAR | Status: DC | PRN

## 2021-08-07 SURGICAL SUPPLY — 31 items
BLADE INCISOR PLUS 4.5 (BLADE) ×2 IMPLANT
BNDG ELASTIC 4X5.8 VLCR STR LF (GAUZE/BANDAGES/DRESSINGS) ×2 IMPLANT
CHLORAPREP W/TINT 26 (MISCELLANEOUS) ×3 IMPLANT
CUFF TOURN SGL QUICK 24 (TOURNIQUET CUFF)
CUFF TOURN SGL QUICK 34 (TOURNIQUET CUFF)
CUFF TRNQT CYL 24X4X16.5-23 (TOURNIQUET CUFF) IMPLANT
CUFF TRNQT CYL 34X4.125X (TOURNIQUET CUFF) IMPLANT
DRAPE ARTHRO LIMB 89X125 STRL (DRAPES) ×3 IMPLANT
DRAPE C-ARMOR (DRAPES) IMPLANT
GAUZE SPONGE 4X4 12PLY STRL (GAUZE/BANDAGES/DRESSINGS) ×3 IMPLANT
GLOVE SURG SYN 9.0  PF PI (GLOVE) ×2
GLOVE SURG SYN 9.0 PF PI (GLOVE) ×1 IMPLANT
GOWN SRG 2XL LVL 4 RGLN SLV (GOWNS) ×1 IMPLANT
GOWN STRL NON-REIN 2XL LVL4 (GOWNS) ×2
GOWN STRL REUS W/ TWL LRG LVL3 (GOWN DISPOSABLE) ×2 IMPLANT
GOWN STRL REUS W/TWL LRG LVL3 (GOWN DISPOSABLE) ×4
IV LACTATED RINGER IRRG 3000ML (IV SOLUTION) ×4
IV LR IRRIG 3000ML ARTHROMATIC (IV SOLUTION) ×2 IMPLANT
KIT TURNOVER KIT A (KITS) ×3 IMPLANT
MANIFOLD NEPTUNE II (INSTRUMENTS) ×2 IMPLANT
NEEDLE HYPO 22GX1.5 SAFETY (NEEDLE) ×3 IMPLANT
PACK ARTHROSCOPY KNEE (MISCELLANEOUS) ×3 IMPLANT
SCALPEL PROTECTED #11 DISP (BLADE) ×3 IMPLANT
SPONGE T-LAP 18X18 ~~LOC~~+RFID (SPONGE) ×1 IMPLANT
SUT ETHILON 4-0 (SUTURE) ×2
SUT ETHILON 4-0 FS2 18XMFL BLK (SUTURE) ×1
SUTURE ETHLN 4-0 FS2 18XMF BLK (SUTURE) ×1 IMPLANT
TUBING INFLOW SET DBFLO PUMP (TUBING) ×3 IMPLANT
TUBING OUTFLOW SET DBLFO PUMP (TUBING) ×3 IMPLANT
WAND COBLATION FLOW 50 (SURGICAL WAND) ×2 IMPLANT
WATER STERILE IRR 500ML POUR (IV SOLUTION) ×3 IMPLANT
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
## 2021-08-07 NOTE — Anesthesia Preprocedure Evaluation (Signed)
Anesthesia Evaluation  ?Patient identified by MRN, date of birth, ID band ?Patient awake ? ? ? ?Reviewed: ?Allergy & Precautions, NPO status , Patient's Chart, lab work & pertinent test results ? ?History of Anesthesia Complications ?Negative for: history of anesthetic complications ? ?Airway ?Mallampati: III ? ?TM Distance: <3 FB ?Neck ROM: full ? ? ? Dental ? ?(+) Poor Dentition, Missing ?  ?Pulmonary ?neg shortness of breath, former smoker,  ?  ?Pulmonary exam normal ? ? ? ? ? ? ? Cardiovascular ?hypertension, (-) angina+ CAD, + Past MI, + CABG, + Peripheral Vascular Disease and +CHF  ?Normal cardiovascular exam+ dysrhythmias  ? ? ?  ?Neuro/Psych ?TIA Neuromuscular disease CVA negative psych ROS  ? GI/Hepatic ?Neg liver ROS, hiatal hernia, GERD  Controlled,  ?Endo/Other  ?diabetes, Type 2 ? Renal/GU ?  ? ?  ?Musculoskeletal ? ?(+) Arthritis ,  ? Abdominal ?  ?Peds ? Hematology ?negative hematology ROS ?(+)   ?Anesthesia Other Findings ?Patient has cardiac clearance for this procedure.  ? ?Past Medical History: ?No date: Anginal pain ([place]) ?No date: Arthritis ?No date: Carpal tunnel syndrome of right wrist ?No date: CHF (congestive heart failure) ([place]) ?    Comment:  a.)  TTE 05/08/2016: EF 55%; trivial MR, mild TR/PR;  ?             G1DD.  b.)  TTE 11/10/2017: EF 55 to 60%; trivial MR;  ?             apical HK; G1DD. c.)  TTE 12/13/2018: EF 50-55%;  ?             periapical HK; mild LA enlargement. d.)  TTE 05/11/2020  ?             EF 50 to 60%; mild MR. e.)  TTE 08/03/2021: EF >55%; triv ?             MR/TR, mild PR; G1DD ?08/09/2009: Coronary artery disease ?    Comment:  a.) NSTEMI 08/09/2009 --> LHC: 30% pLAD, 100% mLAD, 30%  ?             mLCx, 70% OM1, 20% pRCA, 20% mRCA, 30% dRCA, 100% RPDA;  ?             no intervention. b.) LHC 08/11/2013: 100% mLAD, 90% pLCx, ?             75% OM1, 100% RPDA; refer to CVTS. c.) 5v CABG 08/20/2013 ?No date: DDD (degenerative disc  disease), lumbar ?No date: Diverticulosis ?No date: Full dentures ?No date: GERD (gastroesophageal reflux disease) ?No date: Hiatal hernia ?No date: HLD (hyperlipidemia) ?No date: HTN (hypertension) ?05/10/2020: Lacunar infarct, acute ([place]) ?No date: Long term current use of antithrombotics/antiplatelets ?    Comment:  a.) dipyridamole-aspirin ?No date: Lumbar radiculopathy ?11/10/2017: Mural thrombus of cardiac apex ?    Comment:  a.) TTE 11/10/2017: small LV apex thrombus measuring 10  ?             x 10 mm ?No date: Neuropathy ?08/09/2009: NSTEMI (non-ST elevated myocardial infarction) ([place]) ?    Comment:  a.) LHC 08/09/2009: 30% pLAD, 100% mLAD, 30% mLCx, 70%  ?             OM1, 20% pRCA, 20% mRCA, 30% dRCA, 100% RPDA;  ?             intervention deferred opting for aggressive medical  ?  management. 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    Comment:  a.)  LIMA-LAD, SVG-diagonal-OM (sequential), SVG-RV  ?             branch-PDA (sequential) ?No date: Senile cataract ?05/10/2020: Type 2 diabetes mellitus with complication, without long- ?term current use of insulin ([place]) ?No date: Wears hearing aid in both ears ? ?Past Surgical History: ?1991: ANKLE RECONSTRUCTION; Left ?No date: APPENDECTOMY ?No date: BACK SURGERY; N/A ?    Comment:  lower back x 3 ?03/2014: CARPAL TUNNEL RELEASE; Left ?    Comment:  In Dr. Rudene Christians office ?10/20/2014: CARPAL TUNNEL RELEASE; Left ?    Comment:  Procedure: CARPAL TUNNEL RELEASE;  Surgeon: Legrand Como  ?             Rudene Christians, MD;  Location: [facility];  Service: Orthopedics;   ?             Laterality: Left; ?No date: CATARACT EXTRACTION W/ INTRAOCULAR LENS IMPLANT; Bilateral ?08/20/2013: CORONARY ARTERY BYPASS GRAFT; N/A ?    Comment:  Procedure: 5v CORONARY ARTERY BYPASS GRAFT; Location:  ?             [place]; Surgeon: Frances Furbish Villavicencio  ?             Jacqualyn Posey, MD ?10/26/2015: ESOPHAGOGASTRODUODENOSCOPY (EGD) WITH PROPOFOL; N/A ?     Comment:  Procedure: ESOPHAGOGASTRODUODENOSCOPY (EGD) WITH  ?             PROPOFOL with dialation;  Surgeon: Lucilla Lame, MD;   ?             Location: [place];  Service: Endoscopy;   ?             Laterality: N/A; ?08/09/2009: LEFT HEART CATH AND CORONARY ANGIOGRAPHY; Left ?    Comment:  Procedure: LEFT HEART CATH AND CORONARY ANGIOGRAPHY;  ?             Location: [place]; Surgeon: Serafina Royals, MD ?08/11/2013: LEFT HEART CATH AND CORONARY ANGIOGRAPHY; Left ?    Comment:  Procedure: LEFT HEART CATH AND CORONARY ANGIOGRAPHY;  ?             Location: [place]; Surgeon: Bartholome Bill, MD ?05/06/2018: LUMBAR LAMINECTOMY/DECOMPRESSION MICRODISCECTOMY;  ?Bilateral ?    Comment:  Procedure: Bilateral Lumbar Four-Five  ?             Laminectomy/Foraminotomy;  Surgeon: Kristeen Miss, MD;   ?             Location: [place];  Service: Neurosurgery;  Laterality:  ?             Bilateral;  Bilateral Lumbar 4-5 Laminectomy/Foraminotomy ?No date: NECK SURGERY; N/A ?    Comment:  neck x 2, one discectomy and one shaved disc. ?06/06/2015: SHOULDER ARTHROSCOPY; Right ?    Comment:  Procedure: right shoulder arthroscopy, arthroscopic  ?             debridement, subacromial decompression, SLAP repair,  ?             biotenodesis, mini open rotator cuff repair;  Surgeon:  ?             Corky Mull, MD;  Location: [facility];  Service:  ?             Orthopedics;  Laterality: Right; ?No date: SHOULDER OPEN ROTATOR CUFF REPAIR; Left ? ?BMI   ?  Body Mass Index: 29.95 kg/m?  ?  ? ? Reproductive/Obstetrics ?negative OB ROS ? ?  ? ? ? ? ? ? ? ? ? ? ? ? ? ?  ?  ? ? ? ? ? ? ? ? ?Anesthesia Physical ?Anesthesia Plan ? ?ASA: 3 ? ?Anesthesia Plan: General LMA  ? ?Post-op Pain Management:   ? ?Induction: Intravenous ? ?PONV Risk Score and Plan: Dexamethasone, Ondansetron, Midazolam and Treatment may vary due to age or medical condition ? ?Airway Management Planned: LMA ? ?Additional Equipment:  ? ?Intra-op Plan:  ? ?Post-operative Plan:  Extubation in OR ? ?Informed Consent: I have reviewed the patients History and Physical, chart, labs and discussed the procedure including the risks, benefits and alternatives for the proposed anesthesia with the patient or authorized representative who has indicated his/her understanding and acceptance.  ? ? ? ?Dental Advisory Given ? ?Plan Discussed with: Anesthesiologist, CRNA and Surgeon ? ?Anesthesia Plan Comments: (Patient consented for risks of anesthesia including but not limited to:  ?- adverse reactions to medications ?- damage to eyes, teeth, lips or other oral mucosa ?- nerve damage due to positioning  ?- sore throat or hoarseness ?- Damage to heart, brain, nerves, lungs, other parts of body or loss of life ? ?Patient voiced understanding.)  ? ? ? ? ? ? ?Anesthesia Quick Evaluation ? ?

## 2021-08-07 NOTE — Transfer of Care (Signed)
Immediate Anesthesia Transfer of Care Note ? ?Patient: [name] [name] ? ?Procedure(s) Performed: Right knee arthroscopy with partial medial meniscectomy and partial lateral meniscectomy (Right: Knee) ? ?Patient Location: PACU ? ?Anesthesia Type:General ? ?Level of Consciousness: sedated ? ?Airway & Oxygen Therapy: Patient Spontanous Breathing and Patient connected to face mask oxygen ? ?Post-op Assessment: Report given to RN and Post -op Vital signs reviewed and stable ? ?Post vital signs: Reviewed and stable ? ?Last Vitals:  ?Vitals Value Taken Time  ?BP 114/65 08/07/21 0947  ?Temp 36.4 ?C 08/07/21 0947  ?Pulse 64 08/07/21 0953  ?Resp 10 08/07/21 0953  ?SpO2 100 % 08/07/21 0953  ?Vitals shown include unvalidated device data. ? ?Last Pain:  ?Vitals:  ? 08/07/21 0947  ?TempSrc:   ?PainSc: Asleep  ?   ? ?  ? ?Complications: No notable events documented. ?

## 2021-08-07 NOTE — Anesthesia Procedure Notes (Addendum)
Procedure Name: LMA Insertion ?Date/Time: 08/07/2021 9:18 AM ?Performed by: Junious Silk, CRNA ?Pre-anesthesia Checklist: Patient identified, Patient being monitored, Timeout performed, Emergency Drugs available and Suction available ?Patient Re-evaluated:Patient Re-evaluated prior to induction ?Oxygen Delivery Method: Circle system utilized ?Preoxygenation: Pre-oxygenation with 100% oxygen ?Induction Type: IV induction ?Ventilation: Mask ventilation without difficulty ?LMA: LMA inserted ?LMA Size: 4.0 ?Tube type: Oral ?Number of attempts: 1 ?Placement Confirmation: positive ETCO2 and breath sounds checked- equal and bilateral ?Tube secured with: Tape ?Dental Injury: Teeth and Oropharynx as per pre-operative assessment  ? ? ? ? ?

## 2021-08-07 NOTE — Discharge Instructions (Addendum)
Keep dressing clean and dry ?Additional pain medicine as directed ?Weightbearing as tolerated on right leg but try to minimize activities until recheck ?Call office if you are having problems ?Resume all your regular medications today ? ?AMBULATORY SURGERY  ?DISCHARGE INSTRUCTIONS ? ? ?The drugs that you were given will stay in your system until tomorrow so for the next 24 hours you should not: ? ?Drive an automobile ?Make any legal decisions ?Drink any alcoholic beverage ? ? ?You may resume regular meals tomorrow.  Today it is better to start with liquids and gradually work up to solid foods. ? ?You may eat anything you prefer, but it is better to start with liquids, then soup and crackers, and gradually work up to solid foods. ? ? ?Please notify your doctor immediately if you have any unusual bleeding, trouble breathing, redness and pain at the surgery site, drainage, fever, or pain not relieved by medication. ? ? ? ?Additional Instructions: ? ? ? ?Please contact your physician with any problems or Same Day Surgery at [phone], Monday through Friday 6 am to 4 pm, or [place] at [facility] number at [phone].  ?

## 2021-08-07 NOTE — Op Note (Signed)
08/07/2021 ? ?9:48 AM ? ?PATIENT:  [name] [name]  76 y.o. male ? ?PRE-OPERATIVE DIAGNOSIS:  Patellofemoral pain syndrome of right knee  M22.2X1 ?Primary osteoarthritis of right knee  M17.11 ?Mechanical knee pain, right  M25.561 ? ?POST-OPERATIVE DIAGNOSIS:  medial and lateral meniscal tears and osteoarthritis ? ?PROCEDURE:  Procedure(s): ?Right knee arthroscopy with partial medial meniscectomy and partial lateral meniscectomy (Right) ? ?SURGEON: Leitha Schuller, MD ? ?ASSISTANTS: None ? ?ANESTHESIA:   general ? ?EBL:  Total I/O ?In: 100 [IV Piggyback:100] ?Out: -  ? ?BLOOD ADMINISTERED:none ? ?DRAINS: none  ? ?LOCAL MEDICATIONS USED:  MARCA[place]    ? ?SPECIMEN:  No Specimen ? ?DISPOSITION OF SPECIMEN:  N/A ? ?COUNTS:  YES ? ?TOURNIQUET:   ?Total Tourniquet Time Documented: ?Thigh (Right) - 16 minutes ?Total: Thigh (Right) - 16 minutes ? ? ?IMPLANTS: None ? ?DICTATION: .Dragon Dictation patient was brought to the operating room and after adequate anesthesia was obtained the right leg was placed in the arthroscopic leg holder with a tourniquet applied.  After prepping and draping in the usual sterile manner appropriate patient identification timeout procedures were completed.  An inferior lateral portal was made due to the arthroscope introduced.  Looking in the suprapatellar pouch there were no loose bodies there is moderate patellofemoral degenerative change with normal tracking the medial and lateral gutters were checked and no loose bodies present.  Come around the medial compartment inferior medial portal was made and on probing there is a tear of the anterior horn of the medial meniscus involving the anterior third this was ablated with use of an ArthroCare wand.  There is fibrillation of the articular cartilage but no exposed bone in the notch the ACL is intact.  Going to the lateral compartment there is a tear of the middle third with a flap tear present this was ablated with the ArthroCare wand the articular  cartilage had mild degenerative changes.  Probing the meniscus subsequent to this showed no additional tears the knee was irrigated until clear all instrumentation withdrawn and 10 cc of half percent Sensorcaine was infiltrated the area of each portal followed by skin closure with 4-0 nylon in a simple interrupted fashion.  Xeroform 4 x 4 web roll and Ace wrap applied.  Tourniquet had been raised at the start of the case and let down at the end prior to closure. ? ?PLAN OF CARE: Discharge to home after PACU ? ?PATIENT DISPOSITION:  PACU - hemodynamically stable. ?  ? ?

## 2021-08-07 NOTE — Anesthesia Postprocedure Evaluation (Signed)
Anesthesia Post Note ? ?Patient: [name] [name] ? ?Procedure(s) Performed: Right knee arthroscopy with partial medial meniscectomy and partial lateral meniscectomy (Right: Knee) ? ?Patient location during evaluation: PACU ?Anesthesia Type: General ?Level of consciousness: awake and alert ?Pain management: pain level controlled ?Vital Signs Assessment: post-procedure vital signs reviewed and stable ?Respiratory status: spontaneous breathing, nonlabored ventilation, respiratory function stable and patient connected to nasal cannula oxygen ?Cardiovascular status: blood pressure returned to baseline and stable ?Postop Assessment: no apparent nausea or vomiting ?Anesthetic complications: no ? ? ?No notable events documented. ? ? ?Last Vitals:  ?Vitals:  ? 08/07/21 1030 08/07/21 1046  ?BP: 112/62   ?Pulse: (!) 59 (!) 56  ?Resp: 12 12  ?Temp:  (!) 36.1 ?C  ?SpO2: 100% 98%  ?  ?Last Pain:  ?Vitals:  ? 08/07/21 1046  ?TempSrc: Temporal  ?PainSc: 0-No pain  ? ? ?  ?  ?  ?  ?  ?  ? ?Cleda Mccreedy [place] ? ? ? ? ?

## 2021-08-08 ENCOUNTER — Encounter: Payer: Self-pay | Admitting: Orthopedic Surgery

## 2022-01-01 ENCOUNTER — Other Ambulatory Visit: Payer: Self-pay | Admitting: Orthopedic Surgery

## 2022-01-01 DIAGNOSIS — G8929 Other chronic pain: Secondary | ICD-10-CM

## 2022-01-03 NOTE — Progress Notes (Signed)
Patient on schedule for myelogram 8/10, called and left message for patient to call back for questions. Made aware to be here at 0830, NPO after 4am, and driver post procedure/recovery/discharge.

## 2022-01-10 ENCOUNTER — Ambulatory Visit
Admission: RE | Admit: 2022-01-10 | Discharge: 2022-01-10 | Disposition: A | Discharge status: Home / Self Care | Payer: Medicare HMO | Source: Ambulatory Visit | Attending: Orthopedic Surgery | Admitting: Orthopedic Surgery

## 2022-01-10 ENCOUNTER — Other Ambulatory Visit: Payer: Medicare HMO

## 2022-01-10 DIAGNOSIS — M4316 Spondylolisthesis, lumbar region: Secondary | ICD-10-CM | POA: Diagnosis not present

## 2022-01-10 DIAGNOSIS — G8929 Other chronic pain: Secondary | ICD-10-CM

## 2022-01-10 DIAGNOSIS — M48061 Spinal stenosis, lumbar region without neurogenic claudication: Secondary | ICD-10-CM | POA: Diagnosis not present

## 2022-01-10 DIAGNOSIS — M5442 Lumbago with sciatica, left side: Secondary | ICD-10-CM | POA: Diagnosis present

## 2022-01-10 MED ORDER — IOHEXOL 180 MG/ML  SOLN
18.0000 mL | Freq: Once | INTRAMUSCULAR | Status: AC | PRN
  Administered 2022-01-10: 18 mL

## 2022-01-10 MED ORDER — LIDOCAINE HCL (PF) 1 % IJ SOLN
5.0000 mL | Freq: Once | INTRAMUSCULAR | Status: AC
Start: 2022-01-10 — End: 2022-01-10
  Administered 2022-01-10: 5 mL

## 2022-01-10 MED ORDER — ACETAMINOPHEN 325 MG PO TABS
650.0000 mg | ORAL_TABLET | Freq: Once | ORAL | Status: DC
  Filled 2022-01-10: qty 2

## 2022-01-10 NOTE — Discharge Instructions (Signed)
Myelogram, Care After Refer to this sheet in the next few weeks. These instructions provide you with information on caring for yourself after your procedure. Your health care provider may also give you more specific instructions. Your treatment has been planned according to current medical practices, but problems sometimes occur. Call your health care provider if you have any problems or questions after your procedure. What can I expect after the procedure? After your procedure, it is typical to have the following sensations: Mild discomfort or pain at the insertion site. Mild headache that is relieved with pain medicines.  Follow these instructions at home:  Avoid lifting anything heavier than 10 lb (4.5 kg) for at least 12 hours after the procedure. Drink enough fluids to keep your urine clear or pale yellow. Remain at 30 degree head elevation for the remainder of the day.  Contact a health care provider if: You have fever or chills. You have nausea or vomiting. You have a headache that lasts for more than 2 days. Get help right away if: You have any numbness or tingling in your legs. You are unable to control your bowel or bladder. You have bleeding or swelling in your back at the insertion site. You are dizzy or faint. This information is not intended to replace advice given to you by your health care provider. Make sure you discuss any questions you have with your health care provider. Document Released: 05/25/2013 Document Revised: 10/26/2015 Document Reviewed: 01/26/2013 Elsevier Interactive Patient Education  2017 [employer]  
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
## 2022-05-16 ENCOUNTER — Ambulatory Visit: Payer: Managed Care, Other (non HMO) | Admitting: Neurosurgery

## 2022-05-16 ENCOUNTER — Encounter: Payer: Self-pay | Admitting: Orthopedic Surgery

## 2022-05-16 ENCOUNTER — Ambulatory Visit: Payer: Medicare HMO | Admitting: Orthopedic Surgery

## 2022-05-16 VITALS — BP 129/67 | HR 52 | Temp 97.7°F | Ht 69.0 in | Wt 191.4 lb

## 2022-05-16 DIAGNOSIS — M47816 Spondylosis without myelopathy or radiculopathy, lumbar region: Secondary | ICD-10-CM

## 2022-05-16 DIAGNOSIS — M4726 Other spondylosis with radiculopathy, lumbar region: Secondary | ICD-10-CM

## 2022-05-16 DIAGNOSIS — M48061 Spinal stenosis, lumbar region without neurogenic claudication: Secondary | ICD-10-CM

## 2022-05-16 DIAGNOSIS — M4316 Spondylolisthesis, lumbar region: Secondary | ICD-10-CM

## 2022-05-16 DIAGNOSIS — Z9889 Other specified postprocedural states: Secondary | ICD-10-CM

## 2022-05-16 DIAGNOSIS — M5416 Radiculopathy, lumbar region: Secondary | ICD-10-CM

## 2022-05-17 ENCOUNTER — Ambulatory Visit: Payer: Managed Care, Other (non HMO) | Admitting: Neurosurgery

## 2022-10-25 ENCOUNTER — Encounter: Payer: Self-pay | Admitting: Orthopedic Surgery

## 2022-10-25 ENCOUNTER — Other Ambulatory Visit: Payer: Self-pay | Admitting: Orthopedic Surgery

## 2022-10-25 DIAGNOSIS — M1612 Unilateral primary osteoarthritis, left hip: Secondary | ICD-10-CM

## 2022-10-31 ENCOUNTER — Ambulatory Visit
Admission: RE | Admit: 2022-10-31 | Discharge: 2022-10-31 | Disposition: A | Discharge status: Home / Self Care | Payer: Medicare HMO | Source: Ambulatory Visit | Attending: Orthopedic Surgery | Admitting: Orthopedic Surgery

## 2022-10-31 DIAGNOSIS — M1612 Unilateral primary osteoarthritis, left hip: Secondary | ICD-10-CM

## 2022-11-19 ENCOUNTER — Emergency Department
Admission: EM | Admit: 2022-11-19 | Discharge: 2022-11-19 | Disposition: A | Discharge status: Home / Self Care | Payer: Medicare HMO | Attending: Emergency Medicine | Admitting: Emergency Medicine

## 2022-11-19 ENCOUNTER — Other Ambulatory Visit: Payer: Self-pay

## 2022-11-19 DIAGNOSIS — I2510 Atherosclerotic heart disease of native coronary artery without angina pectoris: Secondary | ICD-10-CM | POA: Insufficient documentation

## 2022-11-19 DIAGNOSIS — R739 Hyperglycemia, unspecified: Secondary | ICD-10-CM

## 2022-11-19 DIAGNOSIS — E1165 Type 2 diabetes mellitus with hyperglycemia: Secondary | ICD-10-CM | POA: Diagnosis not present

## 2022-11-19 DIAGNOSIS — Z8673 Personal history of transient ischemic attack (TIA), and cerebral infarction without residual deficits: Secondary | ICD-10-CM | POA: Insufficient documentation

## 2022-11-19 LAB — CBG MONITORING, ED
Glucose-Capillary: 320 mg/dL — ABNORMAL HIGH (ref 70–99)
Glucose-Capillary: 210 mg/dL — ABNORMAL HIGH (ref 70–99)

## 2022-11-19 LAB — CBC
HCT: 39.1 % (ref 39.0–52.0)
Hemoglobin: 13.9 g/dL (ref 13.0–17.0)
MCH: 31.8 pg (ref 26.0–34.0)
MCHC: 35.5 g/dL (ref 30.0–36.0)
MCV: 89.5 fL (ref 80.0–100.0)
Platelets: 263 10*3/uL (ref 150–400)
RBC: 4.37 MIL/uL (ref 4.22–5.81)
RDW: 12.6 % (ref 11.5–15.5)
WBC: 11.5 10*3/uL — ABNORMAL HIGH (ref 4.0–10.5)
nRBC: 0 % (ref 0.0–0.2)

## 2022-11-19 LAB — BASIC METABOLIC PANEL
Anion gap: 12 (ref 5–15)
BUN: 13 mg/dL (ref 8–23)
CO2: 22 mmol/L (ref 22–32)
Calcium: 8.7 mg/dL — ABNORMAL LOW (ref 8.9–10.3)
Chloride: 101 mmol/L (ref 98–111)
Creatinine, Ser: 0.72 mg/dL (ref 0.61–1.24)
GFR, Estimated: >60 mL/min (ref >60)
Glucose, Bld: 302 mg/dL — ABNORMAL HIGH (ref 70–99)
Potassium: 3.8 mmol/L (ref 3.5–5.1)
Sodium: 134 mmol/L — ABNORMAL LOW (ref 135–145)

## 2022-11-19 MED ORDER — SODIUM CHLORIDE 0.9 % IV BOLUS
1000.0000 mL | Freq: Once | INTRAVENOUS | Status: AC
  Administered 2022-11-19: 1000 mL via INTRAVENOUS

## 2022-11-19 MED ORDER — INSULIN ASPART 100 UNIT/ML IJ SOLN
5.0000 [IU] | Freq: Once | INTRAMUSCULAR | Status: DC
  Filled 2022-11-19: qty 1

## 2022-11-19 NOTE — Inpatient Diabetes Management (Signed)
Inpatient Diabetes Program Recommendations  AACE/ADA: New Consensus Statement on Inpatient Glycemic Control (2015)  Target Ranges:  Prepandial:   less than 140 mg/dL      Peak postprandial:   less than 180 mg/dL (1-2 hours)      Critically ill patients:  140 - 180 mg/dL   Lab Results  Component Value Date   GLUCAP 320 (H) 11/19/2022   HGBA1C 6.3 (H) 05/11/2020    Review of Glycemic Control  Diabetes history: DM2 Outpatient Diabetes medications: Amaryl 2 mg qd  Inpatient Diabetes Program Recommendations:    Patient in ED with hyperglycemia. A1c from [facility] 11/11/22 was 8.5. Will follow if hospitalized.  Thank you, Billy Fischer. Aleia Larocca, RN, MSN, CDE  Diabetes Coordinator Inpatient Glycemic Control Team Team Pager [phone] (8am-5pm) 11/19/2022 12:40 PM

## 2022-11-19 NOTE — ED Notes (Signed)
Patient Alert and oriented to baseline. Stable and ambulatory to baseline. Patient verbalized understanding of the discharge instructions.  Patient belongings were taken by the patient.   

## 2022-11-19 NOTE — ED Provider Notes (Signed)
[facility] Provider Note    Event Date/Time   First MD Initiated Contact with Patient 11/19/22 1217     (approximate)   History   Chief Complaint Hyperglycemia   HPI  [name] [name] is a 77 y.o. male with past medical history of diabetes, CAD, PAD, stroke, and GERD who presents to the ED complaining of hyperglycemia.  Patient reports that he received a steroid injection in his back yesterday, went to check his blood sugar earlier this morning and found it to be in the 400s.  He states he has been taking his medication as prescribed, does not currently take insulin and only takes oral medication for his diabetes.  He states he felt shaky earlier, but denies any fevers, cough, chest pain, shortness of breath, nausea, vomiting, diarrhea, or dysuria.      Physical Exam   Triage Vital Signs: ED Triage Vitals  Enc Vitals Group     BP 11/19/22 1143 125/64     Pulse Rate 11/19/22 1143 75     Resp 11/19/22 1143 18     Temp 11/19/22 1143 97.6 F (36.4 C)     Temp Source 11/19/22 1143 Oral     SpO2 11/19/22 1143 97 %     Weight 11/19/22 1144 191 lb 5.8 oz (86.8 kg)     Height 11/19/22 1144 5\' 9"  (1.753 m)     Head Circumference --      Peak Flow --      Pain Score 11/19/22 1144 0     Pain Loc --      Pain Edu? --      Excl. in GC? --     Most recent vital signs: Vitals:   11/19/22 1230 11/19/22 1330  BP: 125/68   Pulse: 67 (!) 50  Resp: 17 17  Temp:    SpO2: 100% 100%    Constitutional: Alert and oriented. Eyes: Conjunctivae are normal. Head: Atraumatic. Nose: No congestion/rhinnorhea. Mouth/Throat: Mucous membranes are moist.  Cardiovascular: Normal rate, regular rhythm. Grossly normal heart sounds.  2+ radial pulses bilaterally. Respiratory: Normal respiratory effort.  No retractions. Lungs CTAB. Gastrointestinal: Soft and nontender. No distention. Musculoskeletal: No lower extremity tenderness nor edema.  Neurologic:  Normal speech  and language. No gross focal neurologic deficits are appreciated.    ED Results / Procedures / Treatments   Labs (all labs ordered are listed, but only abnormal results are displayed) Labs Reviewed  CBC - Abnormal; Notable for the following components:      Result Value   WBC 11.5 (*)    All other components within normal limits  BASIC METABOLIC PANEL - Abnormal; Notable for the following components:   Sodium 134 (*)    Glucose, Bld 302 (*)    Calcium 8.7 (*)    All other components within normal limits  CBG MONITORING, ED - Abnormal; Notable for the following components:   Glucose-Capillary 320 (*)    All other components within normal limits  CBG MONITORING, ED - Abnormal; Notable for the following components:   Glucose-Capillary 210 (*)    All other components within normal limits  URINALYSIS, ROUTINE W REFLEX MICROSCOPIC     EKG  ED ECG REPORT I, Chesley Noon, the attending physician, personally viewed and interpreted this ECG.   Date: 11/19/2022  EKG Time: 11:47  Rate: 68  Rhythm: normal sinus rhythm  Axis: LAD  Intervals:none  ST&T Change: None  PROCEDURES:  Critical Care performed: No  Procedures   MEDICATIONS ORDERED IN ED: Medications  insulin aspart (novoLOG) injection 5 Units (has no administration in time range)  sodium chloride 0.9 % bolus 1,000 mL (1,000 mLs Intravenous New Bag/Given 11/19/22 1335)     IMPRESSION / MDM / ASSESSMENT AND PLAN / ED COURSE  I reviewed the triage vital signs and the nursing notes.                              77 y.o. male with past medical history of diabetes, CAD, PAD, stroke, and GERD who presents to the ED complaining of hyperglycemia at home after receiving steroid injection yesterday.  Patient's presentation is most consistent with acute presentation with potential threat to life or bodily function.  Differential diagnosis includes, but is not limited to, hyperglycemia, DKA, electrolyte abnormality, AKI,  infectious process.  Patient well-appearing and in no acute distress, vital signs are unremarkable.  Patient received a steroid injection yesterday that is likely contributing to his hyperglycemia, no symptoms to suggest infectious process at this time.  Labs are reassuring with no significant anemia, leukocytosis, tract abnormality, or AKI.  He is hyperglycemic with no evidence of DKA, glucose improved after IV fluids and patient currently asymptomatic.  He is appropriate for discharge home with PCP follow-up, was counseled to return to the ED for new or worsening symptoms.  Patient agrees with plan.      FINAL CLINICAL IMPRESSION(S) / ED DIAGNOSES   Final diagnoses:  Hyperglycemia     Rx / DC Orders   ED Discharge Orders     None        Note:  This document was prepared using Dragon voice recognition software and may include unintentional dictation errors.   Chesley Noon, MD 11/19/22 1520

## 2022-11-19 NOTE — ED Triage Notes (Signed)
Pt here via [facility] with hyperglycemia. Pt cbg with ems was 418. Pt takes Amaryl for her his dm. Pt denies pain but states he feels shaky.

## 2022-11-19 NOTE — ED Notes (Signed)
Pt endorses having a cortisone injection yesterday for his hip, today he is "shaky" and his glucose levels were elevated in the 400's. Pt states normal glucose level for him is under 200.

## 2022-12-11 ENCOUNTER — Other Ambulatory Visit: Payer: Self-pay | Admitting: Internal Medicine

## 2022-12-11 ENCOUNTER — Ambulatory Visit: Admission: RE | Admit: 2022-12-11 | Payer: Medicare HMO | Source: Ambulatory Visit

## 2022-12-11 DIAGNOSIS — R222 Localized swelling, mass and lump, trunk: Secondary | ICD-10-CM

## 2022-12-12 ENCOUNTER — Ambulatory Visit
Admission: RE | Admit: 2022-12-12 | Discharge: 2022-12-12 | Disposition: A | Discharge status: Home / Self Care | Payer: Medicare HMO | Source: Ambulatory Visit | Attending: Internal Medicine | Admitting: Internal Medicine

## 2022-12-12 DIAGNOSIS — R222 Localized swelling, mass and lump, trunk: Secondary | ICD-10-CM | POA: Insufficient documentation

## 2022-12-12 MED ORDER — IOHEXOL 300 MG/ML  SOLN
75.0000 mL | Freq: Once | INTRAMUSCULAR | Status: AC | PRN
  Administered 2022-12-12: 75 mL via INTRAVENOUS

## 2022-12-13 ENCOUNTER — Other Ambulatory Visit: Payer: Self-pay | Admitting: Internal Medicine

## 2022-12-13 DIAGNOSIS — R222 Localized swelling, mass and lump, trunk: Secondary | ICD-10-CM

## 2022-12-13 DIAGNOSIS — R9389 Abnormal findings on diagnostic imaging of other specified body structures: Secondary | ICD-10-CM

## 2022-12-18 ENCOUNTER — Other Ambulatory Visit: Payer: Medicare HMO

## 2022-12-24 ENCOUNTER — Other Ambulatory Visit: Payer: Medicare HMO

## 2022-12-24 ENCOUNTER — Encounter
Admission: RE | Admit: 2022-12-24 | Discharge: 2022-12-24 | Disposition: A | Discharge status: Home / Self Care | Payer: Medicare HMO | Source: Ambulatory Visit | Attending: Surgery | Admitting: Surgery

## 2022-12-24 ENCOUNTER — Ambulatory Visit: Payer: Self-pay | Admitting: Surgery

## 2022-12-24 DIAGNOSIS — E118 Type 2 diabetes mellitus with unspecified complications: Secondary | ICD-10-CM

## 2022-12-24 HISTORY — DX: Polyneuropathy, unspecified: G62.9

## 2022-12-24 HISTORY — DX: Acute sinusitis, unspecified: J01.90

## 2022-12-24 HISTORY — DX: Other specified symptoms and signs involving the circulatory and respiratory systems: R09.89

## 2022-12-24 HISTORY — DX: Other chronic pain: G89.29

## 2022-12-24 HISTORY — DX: Pain in unspecified wrist: M25.539

## 2022-12-24 HISTORY — DX: Monoplegia of lower limb affecting left nondominant side: G83.14

## 2022-12-24 HISTORY — DX: Gastritis, unspecified, without bleeding: K29.70

## 2022-12-24 HISTORY — DX: Other specified complication of other internal prosthetic devices, implants and grafts, initial encounter: L89.90

## 2022-12-24 HISTORY — DX: Carpal tunnel syndrome, unspecified upper limb: G56.00

## 2022-12-24 HISTORY — DX: Sciatica, unspecified side: M54.30

## 2022-12-24 HISTORY — DX: Impacted cerumen, unspecified ear: H61.20

## 2022-12-24 HISTORY — DX: Insomnia due to medical condition: G47.01

## 2022-12-24 HISTORY — DX: Pure hypercholesterolemia, unspecified: E78.00

## 2022-12-24 HISTORY — DX: Unspecified hearing loss, unspecified ear: H91.90

## 2022-12-24 HISTORY — DX: Cardiomyopathy, unspecified: I42.9

## 2022-12-24 NOTE — Patient Instructions (Addendum)
Your procedure is scheduled on: 12/25/22  Report to the Registration Desk on the 1st floor of the [facility]. To find out your arrival time, please call [phone] between 1PM - 3PM on: 12/24/22 If your arrival time is 6:00 am, do not arrive before that time as the [facility] entrance doors do not open until 6:00 am.  REMEMBER: Instructions that are not followed completely may result in serious medical risk, up to and including death; or upon the discretion of your surgeon and anesthesiologist your surgery may need to be rescheduled.  Do not eat food after midnight the night before surgery.  No gum chewing or hard candies.  You may drink water up to 2 hours before you are scheduled to arrive for your surgery. Do not drink anything within 2 hours of your scheduled arrival time.  One week prior to surgery: Stop Anti-inflammatories (NSAIDS) such as Advil, Aleve, Ibuprofen, Motrin, Naproxen, Naprosyn and Aspirin based products such as Excedrin, Goody's Powder, BC Powder.  Stop ANY OVER THE COUNTER supplements until after surgery.  You may however, continue to take Tylenol if needed for pain up until the day of surgery.   TAKE ONLY THESE MEDICATIONS THE MORNING OF SURGERY WITH A SIP OF WATER:  pantoprazole (PROTONIX)  (take one the night before and one on the morning of surgery - helps to prevent nausea after surgery.) dicyclomine (BENTYL)  ezetimibe (ZETIA)  gabapentin (NEURONTIN)   No Alcohol for 24 hours before or after surgery.  No Smoking including e-cigarettes for 24 hours before surgery.  No chewable tobacco products for at least 6 hours before surgery.  No nicotine patches on the day of surgery.  Do not use any "recreational" drugs for at least a week (preferably 2 weeks) before your surgery.  Please be advised that the combination of cocaine and anesthesia may have negative outcomes, up to and including death. If you test positive for cocaine, your surgery will be  cancelled.  On the morning of surgery brush your teeth with toothpaste and water, you may rinse your mouth with mouthwash if you wish. Do not swallow any toothpaste or mouthwash.  Take a shower using Dial anti bacterial soap on the morning of surgery.  Do not wear jewelry, make-up, hairpins, clips or nail polish.  Do not wear lotions, powders, or perfumes.   Do not shave body hair from the neck down 48 hours before surgery.  Contact lenses, hearing aids and dentures may not be worn into surgery.  Do not bring valuables to the hospital. [facility] is not responsible for any missing/lost belongings or valuables.   Notify your doctor if there is any change in your medical condition (cold, fever, infection).  Wear comfortable clothing (specific to your surgery type) to the hospital.  After surgery, you can help prevent lung complications by doing breathing exercises.  Take deep breaths and cough every 1-2 hours. Your doctor may order a device called an Incentive Spirometer to help you take deep breaths. When coughing or sneezing, hold a pillow firmly against your incision with both hands. This is called "splinting." Doing this helps protect your incision. It also decreases belly discomfort.  If you are being admitted to the hospital overnight, leave your suitcase in the car. After surgery it may be brought to your room.  In case of increased patient census, it may be necessary for you, the patient, to continue your postoperative care in the Same Day Surgery department.  If you are being  discharged the day of surgery, you will not be allowed to drive home. You will need a responsible individual to drive you home and stay with you for 24 hours after surgery.   If you are taking public transportation, you will need to have a responsible individual with you.  Please call the Pre-admissions Testing Dept. at [phone] if you have any questions about these instructions.  Surgery  Visitation Policy:  Patients having surgery or a procedure may have two visitors.  Children under the age of 12 must have an adult with them who is not the patient.  Inpatient Visitation:    Visiting hours are 7 a.m. to 8 p.m. Up to four visitors are allowed at one time in a patient room. The visitors may rotate out with other people during the day.  One visitor age 75 or older may stay with the patient overnight and must be in the room by 8 p.m.Your procedure is scheduled on: Report to the Registration Desk on the 1st floor of the [facility]. To find out your arrival time, please call [phone] between 1PM - 3PM on: If your arrival time is 6:00 am, do not arrive before that time as the [facility] entrance doors do not open until 6:00 am.  REMEMBER: Instructions that are not followed completely may result in serious medical risk, up to and including death; or upon the discretion of your surgeon and anesthesiologist your surgery may need to be rescheduled.  Do not eat food after midnight the night before surgery.  No gum chewing or hard candies.  You may however, drink CLEAR liquids up to 2 hours before you are scheduled to arrive for your surgery. Do not drink anything within 2 hours of your scheduled arrival time.  Clear liquids include: - water  - apple juice without pulp - gatorade (not RED colors) - black coffee or tea (Do NOT add milk or creamers to the coffee or tea) Do NOT drink anything that is not on this list.  Type 1 and Type 2 diabetics should only drink water.  In addition, your doctor has ordered for you to drink the provided:  Ensure Pre-Surgery Clear Carbohydrate Drink  Gatorade G2 Drinking this carbohydrate drink up to two hours before surgery helps to reduce insulin resistance and improve patient outcomes. Please complete drinking 2 hours before scheduled arrival time.  One week prior to surgery: Stop Anti-inflammatories (NSAIDS) such as Advil, Aleve,  Ibuprofen, Motrin, Naproxen, Naprosyn and Aspirin based products such as Excedrin, Goody's Powder, BC Powder. Stop ANY OVER THE COUNTER supplements until after surgery. You may however, continue to take Tylenol if needed for pain up until the day of surgery.  Continue taking all prescribed medications with the exception of the following:  **Follow guidelines for insulin and diabetes medications**  Follow recommendations from Cardiologist or PCP regarding stopping blood thinners.  TAKE ONLY THESE MEDICATIONS THE MORNING OF SURGERY WITH A SIP OF WATER:    Antacid (take one the night before and one on the morning of surgery - helps to prevent nausea after surgery.)  Use inhalers on the day of surgery and bring to the hospital.  Fleets enema or bowel prep as directed.  No Alcohol for 24 hours before or after surgery.  No Smoking including e-cigarettes for 24 hours before surgery.  No chewable tobacco products for at least 6 hours before surgery.  No nicotine patches on the day of surgery.  Do not use any "recreational" drugs  for at least a week (preferably 2 weeks) before your surgery.  Please be advised that the combination of cocaine and anesthesia may have negative outcomes, up to and including death. If you test positive for cocaine, your surgery will be cancelled.  On the morning of surgery brush your teeth with toothpaste and water, you may rinse your mouth with mouthwash if you wish. Do not swallow any toothpaste or mouthwash.  Use CHG Soap or wipes as directed on instruction sheet.  Do not wear jewelry, make-up, hairpins, clips or nail polish.  Do not wear lotions, powders, or perfumes.   Do not shave body hair from the neck down 48 hours before surgery.  Contact lenses, hearing aids and dentures may not be worn into surgery.  Do not bring valuables to the hospital. [facility] is not responsible for any missing/lost belongings or valuables.   Total Shoulder  Arthroplasty:  use Benzoyl Peroxide 5% Gel as directed on instruction sheet.  Bring your C-PAP to the hospital in case you may have to spend the night.   Notify your doctor if there is any change in your medical condition (cold, fever, infection).  Wear comfortable clothing (specific to your surgery type) to the hospital.  After surgery, you can help prevent lung complications by doing breathing exercises.  Take deep breaths and cough every 1-2 hours. Your doctor may order a device called an Incentive Spirometer to help you take deep breaths. When coughing or sneezing, hold a pillow firmly against your incision with both hands. This is called "splinting." Doing this helps protect your incision. It also decreases belly discomfort.  If you are being admitted to the hospital overnight, leave your suitcase in the car. After surgery it may be brought to your room.  In case of increased patient census, it may be necessary for you, the patient, to continue your postoperative care in the Same Day Surgery department.  If you are being discharged the day of surgery, you will not be allowed to drive home. You will need a responsible individual to drive you home and stay with you for 24 hours after surgery.   If you are taking public transportation, you will need to have a responsible individual with you.  Please call the Pre-admissions Testing Dept. at [phone] if you have any questions about these instructions.  Surgery Visitation Policy:  Patients having surgery or a procedure may have two visitors.  Children under the age of 67 must have an adult with them who is not the patient.  Inpatient Visitation:    Visiting hours are 7 a.m. to 8 p.m. Up to four visitors are allowed at one time in a patient room. The visitors may rotate out with other people during the day.  One visitor age 33 or older may stay with the patient overnight and must be in the room by 8 p.m.

## 2022-12-24 NOTE — H&P (View-Only) (Signed)
CC: Epidermal cyst [L72.0] POSTOP   HPI:   Subjective [name] [name] is a 77 y.o. male who is here for followup from above.  Still has drainage from area.     Current Medications: has a current medication list which includes the following prescription(s): aspirin-dipyridamole, cyclobenzaprine, dicyclomine, docusate, doxycycline, repatha sureclick, ezetimibe, gabapentin, glimepiride, oxycodone, and pantoprazole.   Allergies:  Allergies       Allergies  Allergen Reactions   Statins-Hmg-Coa Reductase Inhibitors Muscle Pain   Cucumber Fruit Extract Other (See Comments)      GI upset   Parafon Forte [Chlorzoxazone] Itching      severe   Clopidogrel Bisulfate Rash        ROS: General: Denies weight loss, weight gain, fatigue, fevers, chills, and night sweats. Heart: Denies chest pain, palpitations, racing heart, irregular heartbeat, leg pain or swelling, and decreased activity tolerance. Respiratory: Denies breathing difficulty, shortness of breath, wheezing, cough, and sputum. GI: Denies change in appetite, heartburn, nausea, vomiting, constipation, diarrhea, and blood in stool. GU: Denies difficulty urinating, pain with urinating, urgency, frequency, blood in urine     Objective:      Objective BP 112/66   Pulse 52   Ht 175.3 cm (5' 9.02")   Wt 88.5 kg (195 lb 1.7 oz)   BMI 28.80 kg/m    Constitutional :  Alert, no distress, cooperative  Gastrointestinal: soft, non-tender; bowel sounds normal; no masses,  no organomegaly.   Musculoskeletal: Steady gait and movement  Skin: Cool and moist, right chest wall abscess site still with erythema and active purulent drainage.  Size has improved, but infection still present.   Psychiatric: Normal affect, non-agitated, not confused         LABS:  N/A    RADS: N/A   Assessment:        Assessment Epidermal cyst [L72.0] S/p I&D in office.  Much improved but still persistent, so will proceed with formal I&D in OR.    Plan:        Plan 1.Discussed surgical excision.  Alternatives include continued observation.  Benefits include possible symptom relief, pathologic evaluation, improved cosmesis. Discussed the risk of surgery including recurrence, chronic pain, post-op infxn, poor cosmesis, poor/delayed wound healing, and possible re-operation to address said risks. The risks of general anesthetic, if used, includes MI, CVA, sudden death or even reaction to anesthetic medications also discussed.  Typical post-op recovery time of 3-5 days with possible activity restrictions were also discussed.   The patient verbalized understanding and all questions were answered to the patient's satisfaction.   Continue abx and dressing changes in the meantime.   labs/images/medications/previous chart entries reviewed personally and relevant changes/updates noted above.

## 2022-12-24 NOTE — H&P (Addendum)
CC: Epidermal cyst [L72.0] POSTOP   HPI:   Subjective [name] [name] is a 77 y.o. male who is here for followup from above.  Still has drainage from area.     Current Medications: has a current medication list which includes the following prescription(s): aspirin-dipyridamole, cyclobenzaprine, dicyclomine, docusate, doxycycline, repatha sureclick, ezetimibe, gabapentin, glimepiride, oxycodone, and pantoprazole.   Allergies:  Allergies       Allergies  Allergen Reactions   Statins-Hmg-Coa Reductase Inhibitors Muscle Pain   Cucumber Fruit Extract Other (See Comments)      GI upset   Parafon Forte [Chlorzoxazone] Itching      severe   Clopidogrel Bisulfate Rash        ROS: General: Denies weight loss, weight gain, fatigue, fevers, chills, and night sweats. Heart: Denies chest pain, palpitations, racing heart, irregular heartbeat, leg pain or swelling, and decreased activity tolerance. Respiratory: Denies breathing difficulty, shortness of breath, wheezing, cough, and sputum. GI: Denies change in appetite, heartburn, nausea, vomiting, constipation, diarrhea, and blood in stool. GU: Denies difficulty urinating, pain with urinating, urgency, frequency, blood in urine     Objective:      Objective BP 112/66   Pulse 52   Ht 175.3 cm (5' 9.02")   Wt 88.5 kg (195 lb 1.7 oz)   BMI 28.80 kg/m    Constitutional :  Alert, no distress, cooperative  Gastrointestinal: soft, non-tender; bowel sounds normal; no masses,  no organomegaly.   Musculoskeletal: Steady gait and movement  Skin: Cool and moist, right chest wall abscess site still with erythema and active purulent drainage.  Size has improved, but infection still present.   Psychiatric: Normal affect, non-agitated, not confused         LABS:  N/A    RADS: N/A   Assessment:        Assessment Epidermal cyst [L72.0] S/p I&D in office.  Much improved but still persistent, so will proceed with formal I&D in OR.    Plan:        Plan 1.Discussed surgical excision.  Alternatives include continued observation.  Benefits include possible symptom relief, pathologic evaluation, improved cosmesis. Discussed the risk of surgery including recurrence, chronic pain, post-op infxn, poor cosmesis, poor/delayed wound healing, and possible re-operation to address said risks. The risks of general anesthetic, if used, includes MI, CVA, sudden death or even reaction to anesthetic medications also discussed.  Typical post-op recovery time of 3-5 days with possible activity restrictions were also discussed.   The patient verbalized understanding and all questions were answered to the patient's satisfaction.   Continue abx and dressing changes in the meantime.   labs/images/medications/previous chart entries reviewed personally and relevant changes/updates noted above.

## 2022-12-31 MED ORDER — CHLORHEXIDINE GLUCONATE 0.12 % MT SOLN
15.0000 mL | Freq: Once | OROMUCOSAL | Status: AC
  Administered 2023-01-01: 15 mL via OROMUCOSAL

## 2022-12-31 MED ORDER — CEFAZOLIN SODIUM-DEXTROSE 2-4 GM/100ML-% IV SOLN
2.0000 g | INTRAVENOUS | Status: AC
  Administered 2023-01-01: 2 g via INTRAVENOUS

## 2022-12-31 MED ORDER — CHLORHEXIDINE GLUCONATE CLOTH 2 % EX PADS: 6.0000 | MEDICATED_PAD | Freq: Once | CUTANEOUS | Status: DC

## 2022-12-31 MED ORDER — ORAL CARE MOUTH RINSE: 15.0000 mL | Freq: Once | OROMUCOSAL | Status: AC

## 2023-01-01 ENCOUNTER — Other Ambulatory Visit: Payer: Self-pay

## 2023-01-01 ENCOUNTER — Encounter
Admission: RE | Disposition: A | Discharge status: Home / Self Care | Payer: Self-pay | Source: Ambulatory Visit | Attending: Surgery

## 2023-01-01 ENCOUNTER — Encounter: Payer: Self-pay | Admitting: Surgery

## 2023-01-01 ENCOUNTER — Ambulatory Visit: Payer: Medicare HMO | Admitting: General Practice

## 2023-01-01 ENCOUNTER — Ambulatory Visit
Admission: RE | Admit: 2023-01-01 | Discharge: 2023-01-01 | Disposition: A | Discharge status: Home / Self Care | Payer: Medicare HMO | Source: Ambulatory Visit | Attending: Surgery | Admitting: Surgery

## 2023-01-01 DIAGNOSIS — E118 Type 2 diabetes mellitus with unspecified complications: Secondary | ICD-10-CM

## 2023-01-01 DIAGNOSIS — Z951 Presence of aortocoronary bypass graft: Secondary | ICD-10-CM | POA: Diagnosis not present

## 2023-01-01 DIAGNOSIS — L720 Epidermal cyst: Secondary | ICD-10-CM | POA: Diagnosis present

## 2023-01-01 DIAGNOSIS — K219 Gastro-esophageal reflux disease without esophagitis: Secondary | ICD-10-CM | POA: Diagnosis not present

## 2023-01-01 DIAGNOSIS — Z8673 Personal history of transient ischemic attack (TIA), and cerebral infarction without residual deficits: Secondary | ICD-10-CM | POA: Insufficient documentation

## 2023-01-01 DIAGNOSIS — I252 Old myocardial infarction: Secondary | ICD-10-CM | POA: Insufficient documentation

## 2023-01-01 DIAGNOSIS — I10 Essential (primary) hypertension: Secondary | ICD-10-CM | POA: Diagnosis not present

## 2023-01-01 DIAGNOSIS — I2510 Atherosclerotic heart disease of native coronary artery without angina pectoris: Secondary | ICD-10-CM | POA: Insufficient documentation

## 2023-01-01 DIAGNOSIS — E1151 Type 2 diabetes mellitus with diabetic peripheral angiopathy without gangrene: Secondary | ICD-10-CM | POA: Diagnosis not present

## 2023-01-01 DIAGNOSIS — G8929 Other chronic pain: Secondary | ICD-10-CM

## 2023-01-01 DIAGNOSIS — L02213 Cutaneous abscess of chest wall: Secondary | ICD-10-CM

## 2023-01-01 DIAGNOSIS — G6189 Other inflammatory polyneuropathies: Secondary | ICD-10-CM

## 2023-01-01 HISTORY — PX: EXCISION OF SKIN TAG: SHX6270

## 2023-01-01 LAB — GLUCOSE, CAPILLARY
Glucose-Capillary: 167 mg/dL — ABNORMAL HIGH (ref 70–99)
Glucose-Capillary: 153 mg/dL — ABNORMAL HIGH (ref 70–99)

## 2023-01-01 SURGERY — EXCISION, SKIN TAG
Anesthesia: General | Site: Chest

## 2023-01-01 MED ORDER — ONDANSETRON HCL 4 MG/2ML IJ SOLN
INTRAMUSCULAR | Status: AC
  Filled 2023-01-01: qty 2

## 2023-01-01 MED ORDER — LIDOCAINE HCL (PF) 2 % IJ SOLN
INTRAMUSCULAR | Status: AC
  Filled 2023-01-01: qty 5

## 2023-01-01 MED ORDER — ACETAMINOPHEN 325 MG PO TABS: 650.0000 mg | ORAL_TABLET | Freq: Three times a day (TID) | ORAL | 0 refills | Status: AC | PRN

## 2023-01-01 MED ORDER — OXYCODONE HCL 5 MG PO TABS
ORAL_TABLET | ORAL | Status: AC
  Filled 2023-01-01: qty 1

## 2023-01-01 MED ORDER — BUPIVACAINE-EPINEPHRINE 0.5% -1:200000 IJ SOLN
INTRAMUSCULAR | Status: DC | PRN
  Administered 2023-01-01: 15 mL

## 2023-01-01 MED ORDER — LIDOCAINE HCL (PF) 1 % IJ SOLN
INTRAMUSCULAR | Status: DC | PRN
  Administered 2023-01-01: 15 mL

## 2023-01-01 MED ORDER — ONDANSETRON HCL 4 MG/2ML IJ SOLN
INTRAMUSCULAR | Status: DC | PRN
  Administered 2023-01-01: 4 mg via INTRAVENOUS

## 2023-01-01 MED ORDER — CEFAZOLIN SODIUM-DEXTROSE 2-4 GM/100ML-% IV SOLN
INTRAVENOUS | Status: AC
  Filled 2023-01-01: qty 100

## 2023-01-01 MED ORDER — FAMOTIDINE 20 MG PO TABS
ORAL_TABLET | ORAL | Status: AC
  Filled 2023-01-01: qty 1

## 2023-01-01 MED ORDER — DEXAMETHASONE SODIUM PHOSPHATE 10 MG/ML IJ SOLN
INTRAMUSCULAR | Status: DC | PRN
  Administered 2023-01-01: 4 mg via INTRAVENOUS

## 2023-01-01 MED ORDER — OXYCODONE HCL 5 MG/5ML PO SOLN: 5.0000 mg | Freq: Once | ORAL | Status: AC | PRN

## 2023-01-01 MED ORDER — FENTANYL CITRATE (PF) 100 MCG/2ML IJ SOLN: 25.0000 ug | INTRAMUSCULAR | Status: DC | PRN

## 2023-01-01 MED ORDER — CYCLOBENZAPRINE HCL 10 MG PO TABS
10.0000 mg | ORAL_TABLET | ORAL | Status: AC
Start: 2023-01-01 — End: ?

## 2023-01-01 MED ORDER — GABAPENTIN 300 MG PO CAPS
300.0000 mg | ORAL_CAPSULE | ORAL | Status: AC
Start: 2023-01-01 — End: ?

## 2023-01-01 MED ORDER — SODIUM CHLORIDE 0.9 % IV SOLN: INTRAVENOUS | Status: DC

## 2023-01-01 MED ORDER — ACETAMINOPHEN 10 MG/ML IV SOLN
INTRAVENOUS | Status: DC | PRN
  Administered 2023-01-01: 1000 mg via INTRAVENOUS

## 2023-01-01 MED ORDER — FAMOTIDINE 20 MG PO TABS
20.0000 mg | ORAL_TABLET | Freq: Once | ORAL | Status: AC
  Administered 2023-01-01: 20 mg via ORAL

## 2023-01-01 MED ORDER — EPHEDRINE SULFATE (PRESSORS) 50 MG/ML IJ SOLN
INTRAMUSCULAR | Status: DC | PRN
  Administered 2023-01-01: 15 mg via INTRAVENOUS
  Administered 2023-01-01: 10 mg via INTRAVENOUS

## 2023-01-01 MED ORDER — OXYCODONE HCL 10 MG PO TABS: 10.0000 mg | ORAL_TABLET | Freq: Every evening | ORAL | Status: DC

## 2023-01-01 MED ORDER — LIDOCAINE HCL (PF) 1 % IJ SOLN
INTRAMUSCULAR | Status: AC
  Filled 2023-01-01: qty 30

## 2023-01-01 MED ORDER — PROPOFOL 10 MG/ML IV BOLUS
INTRAVENOUS | Status: DC | PRN
  Administered 2023-01-01: 110 mg via INTRAVENOUS
  Administered 2023-01-01: 40 mg via INTRAVENOUS

## 2023-01-01 MED ORDER — PHENYLEPHRINE 80 MCG/ML (10ML) SYRINGE FOR IV PUSH (FOR BLOOD PRESSURE SUPPORT)
PREFILLED_SYRINGE | INTRAVENOUS | Status: DC | PRN
  Administered 2023-01-01: 160 ug via INTRAVENOUS
  Administered 2023-01-01 (×4): 80 ug via INTRAVENOUS

## 2023-01-01 MED ORDER — BUPIVACAINE HCL (PF) 0.5 % IJ SOLN
INTRAMUSCULAR | Status: AC
  Filled 2023-01-01: qty 30

## 2023-01-01 MED ORDER — DEXAMETHASONE SODIUM PHOSPHATE 10 MG/ML IJ SOLN
INTRAMUSCULAR | Status: AC
  Filled 2023-01-01: qty 1

## 2023-01-01 MED ORDER — CHLORHEXIDINE GLUCONATE 0.12 % MT SOLN
OROMUCOSAL | Status: AC
  Filled 2023-01-01: qty 15

## 2023-01-01 MED ORDER — LIDOCAINE HCL (CARDIAC) PF 100 MG/5ML IV SOSY
PREFILLED_SYRINGE | INTRAVENOUS | Status: DC | PRN
  Administered 2023-01-01: 50 mg via INTRAVENOUS

## 2023-01-01 MED ORDER — FENTANYL CITRATE (PF) 100 MCG/2ML IJ SOLN
INTRAMUSCULAR | Status: DC | PRN
  Administered 2023-01-01 (×2): 25 ug via INTRAVENOUS

## 2023-01-01 MED ORDER — PROPOFOL 10 MG/ML IV BOLUS
INTRAVENOUS | Status: AC
  Filled 2023-01-01: qty 20

## 2023-01-01 MED ORDER — OXYCODONE HCL 5 MG PO TABS
5.0000 mg | ORAL_TABLET | Freq: Once | ORAL | Status: AC | PRN
  Administered 2023-01-01: 5 mg via ORAL

## 2023-01-01 MED ORDER — FENTANYL CITRATE (PF) 100 MCG/2ML IJ SOLN
INTRAMUSCULAR | Status: AC
  Filled 2023-01-01: qty 2

## 2023-01-01 MED ORDER — EPINEPHRINE PF 1 MG/ML IJ SOLN
INTRAMUSCULAR | Status: AC
  Filled 2023-01-01: qty 1

## 2023-01-01 MED ORDER — ACETAMINOPHEN 10 MG/ML IV SOLN
INTRAVENOUS | Status: AC
  Filled 2023-01-01: qty 100

## 2023-01-01 SURGICAL SUPPLY — 36 items
ADH SKN CLS APL DERMABOND .7 (GAUZE/BANDAGES/DRESSINGS) ×1
APL PRP STRL LF DISP 70% ISPRP (MISCELLANEOUS)
BLADE SURG 15 STRL LF DISP TIS (BLADE) ×1 IMPLANT
BLADE SURG 15 STRL SS (BLADE) ×1
CHLORAPREP W/TINT 26 (MISCELLANEOUS) ×1 IMPLANT
DERMABOND ADVANCED .7 DNX12 (GAUZE/BANDAGES/DRESSINGS) ×1 IMPLANT
DRAPE 3/4 80X56 (DRAPES) ×1 IMPLANT
DRAPE LAPAROTOMY 100X77 ABD (DRAPES) ×1 IMPLANT
ELECT CAUTERY BLADE 6.4 (BLADE) ×1 IMPLANT
ELECT REM PT RETURN 9FT ADLT (ELECTROSURGICAL) ×1
ELECTRODE REM PT RTRN 9FT ADLT (ELECTROSURGICAL) ×1 IMPLANT
GAUZE 4X4 16PLY ~~LOC~~+RFID DBL (SPONGE) ×1 IMPLANT
GAUZE PACKING IODOFORM 1/2INX (GAUZE/BANDAGES/DRESSINGS) IMPLANT
GLOVE BIOGEL PI IND STRL 7.0 (GLOVE) ×1 IMPLANT
GLOVE SURG SYN 6.5 ES PF (GLOVE) ×2 IMPLANT
GLOVE SURG SYN 6.5 PF PI (GLOVE) ×1 IMPLANT
GOWN STRL REUS W/ TWL LRG LVL3 (GOWN DISPOSABLE) ×2 IMPLANT
GOWN STRL REUS W/TWL LRG LVL3 (GOWN DISPOSABLE) ×2
KIT TURNOVER KIT A (KITS) ×1 IMPLANT
LABEL OR SOLS (LABEL) ×1 IMPLANT
MANIFOLD NEPTUNE II (INSTRUMENTS) ×1 IMPLANT
NDL HYPO 22X1.5 SAFETY MO (MISCELLANEOUS) ×1 IMPLANT
NEEDLE HYPO 22X1.5 SAFETY MO (MISCELLANEOUS) ×1 IMPLANT
NS IRRIG 1000ML POUR BTL (IV SOLUTION) ×1 IMPLANT
PACK BASIN MINOR ARMC (MISCELLANEOUS) ×1 IMPLANT
SUT ETHILON 3-0 FS-10 30 BLK (SUTURE)
SUT MNCRL 4-0 (SUTURE)
SUT MNCRL 4-0 27XMFL (SUTURE)
SUT VIC AB 3-0 SH 27 (SUTURE)
SUT VIC AB 3-0 SH 27X BRD (SUTURE) ×1 IMPLANT
SUTURE EHLN 3-0 FS-10 30 BLK (SUTURE) IMPLANT
SUTURE MNCRL 4-0 27XMF (SUTURE) ×1 IMPLANT
SYR 30ML LL (SYRINGE) ×1 IMPLANT
TOWEL OR 17X26 4PK STRL BLUE (TOWEL DISPOSABLE) ×1 IMPLANT
TRAP FLUID SMOKE EVACUATOR (MISCELLANEOUS) ×1 IMPLANT
WATER STERILE IRR 500ML POUR (IV SOLUTION) ×1 IMPLANT

## 2023-01-01 NOTE — Interval H&P Note (Signed)
History and Physical Interval Note:  01/01/2023 7:27 AM  [name] [name]  has presented today for surgery, with the diagnosis of epidermal cyst.  The various methods of treatment have been discussed with the patient and family. After consideration of risks, benefits and other options for treatment, the patient has consented to  Procedure(s): EXCISION OF SKIN TAG epidermal cyst (N/A) as a surgical intervention.  The patient's history has been reviewed, patient examined, no change in status, stable for surgery.  I have reviewed the patient's chart and labs.  Questions were answered to the patient's satisfaction.    Requesting one additional cyst adjacent to area to be removed. Will proceed  [name] Tonna Boehringer

## 2023-01-01 NOTE — Anesthesia Procedure Notes (Signed)
Procedure Name: LMA Insertion Date/Time: 01/01/2023 8:04 AM  Performed by: Jeannene Patella, CRNAPre-anesthesia Checklist: Patient identified, Timeout performed, Emergency Drugs available, Suction available and Patient being monitored Patient Re-evaluated:Patient Re-evaluated prior to induction Oxygen Delivery Method: Circle system utilized Preoxygenation: Pre-oxygenation with 100% oxygen Induction Type: IV induction LMA: LMA inserted LMA Size: 4.0 Number of attempts: 1 Placement Confirmation: positive ETCO2 and breath sounds checked- equal and bilateral Tube secured with: Tape Dental Injury: Teeth and Oropharynx as per pre-operative assessment

## 2023-01-01 NOTE — Op Note (Addendum)
Pre-Op Dx: Epidermal cyst x 2 Post-Op Dx: Same Anesthesia: LMA EBL: 10 mL Complications:  none apparent Specimen: Epidermal cyst Procedure: excisional biopsy epidermal cyst x 2 left chest wall Surgeon: Tonna Boehringer  Indications for procedure: See H&P  Description of Procedure:  Consent obtained, time out performed.  Patient placed in supine position.  Area sterilized and draped in usual position.  Local infused to area previously marked.  4 cm incision made through dermis with 15blade and epidermal cyst noted in subcutaneous layer.  The  3.2 cm x 4 cm x 4.3 cm cyst then removed from surrounding tissue completely using electrocautery, passed off field pending pathology.  Wound hemostasis noted, then closed in two layer fashion with 3-0 vicryl in interrupted fashion for deep dermal layer, then running 4-0 monocryl in subcuticular fashion for epidermal layer.  Wound then dressed with dermabond. Wound then dressed with dermabond.   The more infected large epidermal cyst inferior to it was then addressed next. Local infused to area previously marked.  4 cm incision made through dermis with 15blade and fragments of epidermal cyst noted in subcutaneous layer.  All fragments removed in a piecemeal fashion from surrounding tissue completely using electrocautery.  Aggrgate size roughtly 5cm x 5cm x 4.5cm Specimens not sent for pathology.   Wound hemostasis noted, and wound packed with half-inch iodoform packing covered with 4 x 4 gauze, secured with paper tape.   Pt tolerated procedure well, and transferred to PACU in stable condition. Sponge and instrument count correct at end of procedure.

## 2023-01-01 NOTE — Transfer of Care (Signed)
Immediate Anesthesia Transfer of Care Note  Patient: [name] [name]  Procedure(s) Performed: EXCISION OF SKIN TAG epidermal cyst (Chest)  Patient Location: PACU  Anesthesia Type:General  Level of Consciousness: drowsy and responds to stimulation  Airway & Oxygen Therapy: Patient Spontanous Breathing and Patient connected to face mask oxygen  Post-op Assessment: Report given to RN and Post -op Vital signs reviewed and stable  Post vital signs: Reviewed and stable  Last Vitals:  Vitals Value Taken Time  BP 119/56 01/01/23 0850  Temp    Pulse 76 01/01/23 0853  Resp 11 01/01/23 0853  SpO2 99 % 01/01/23 0853  Vitals shown include unfiled device data.  Last Pain:  Vitals:   01/01/23 0645  TempSrc: Temporal  PainSc: 0-No pain         Complications: No notable events documented.

## 2023-01-01 NOTE — Discharge Instructions (Addendum)
Removal, Care After This sheet gives you information about how to care for yourself after your procedure. Your health care provider may also give you more specific instructions. If you have problems or questions, contact your health care provider. What can I expect after the procedure? After the procedure, it is common to have: Soreness. Bruising. Itching. Follow these instructions at home: site care Follow instructions from your health care provider about how to take care of your site. Make sure you: Wash your hands with soap and water before and after you change your bandage (dressing). If soap and water are not available, use hand sanitizer. Leave stitches (sutures), skin glue, or adhesive strips in place. These skin closures may need to stay in place for 2 weeks or longer. If adhesive strip edges start to loosen and curl up, you may trim the loose edges. Do not remove adhesive strips completely unless your health care provider tells you to do that. If the area bleeds or bruises, apply gentle pressure for 10 minutes. OK TO SHOWER IN 24HRS  Check your site every day for signs of infection. Check for: Redness, swelling, or pain. Fluid or blood. Warmth. Pus or a bad smell.  General instructions Rest and then return to your normal activities as told by your health care provider.  tylenol and advil as needed for discomfort.  Please alternate between the two every four hours as needed for pain.    Use narcotics, if prescribed, only when tylenol and motrin is not enough to control pain.  325-650mg every 8hrs to max of 3000mg/24hrs (including the 325mg in every norco dose) for the tylenol.    Advil up to 800mg per dose every 8hrs as needed for pain.   Keep all follow-up visits as told by your health care provider. This is important. Contact a health care provider if: You have redness, swelling, or pain around your site. You have fluid or blood coming from your site. Your site feels warm to  the touch. You have pus or a bad smell coming from your site. You have a fever. Your sutures, skin glue, or adhesive strips loosen or come off sooner than expected. Get help right away if: You have bleeding that does not stop with pressure or a dressing. Summary After the procedure, it is common to have some soreness, bruising, and itching at the site. Follow instructions from your health care provider about how to take care of your site. Check your site every day for signs of infection. Contact a health care provider if you have redness, swelling, or pain around your site, or your site feels warm to the touch. Keep all follow-up visits as told by your health care provider. This is important. This information is not intended to replace advice given to you by your health care provider. Make sure you discuss any questions you have with your health care provider. Document Released: 06/16/2015 Document Revised: 11/17/2017 Document Reviewed: 11/17/2017 Elsevier Interactive Patient Education  2019 [employer]  AMBULATORY SURGERY  DISCHARGE INSTRUCTIONS   The drugs that you were given will stay in your system until tomorrow so for the next 24 hours you should not:  Drive an automobile Make any legal decisions Drink any alcoholic beverage   You may resume regular meals tomorrow.  Today it is better to start with liquids and gradually work up to solid foods.  You may eat anything you prefer, but it is better to start with liquids, then soup and crackers, and gradually   work up to solid foods.   Please notify your doctor immediately if you have any unusual bleeding, trouble breathing, redness and pain at the surgery site, drainage, fever, or pain not relieved by medication.    Additional Instructions:        Please contact your physician with any problems or Same Day Surgery at [phone], Monday through Friday 6 am to 4 pm, or [facility] [place] Main number at  [phone]. 

## 2023-01-01 NOTE — Anesthesia Postprocedure Evaluation (Signed)
Anesthesia Post Note  Patient: [name] [name]  Procedure(s) Performed: EXCISION OF SKIN TAG epidermal cyst (Chest)  Patient location during evaluation: PACU Anesthesia Type: General Level of consciousness: awake and alert Pain management: pain level controlled Vital Signs Assessment: post-procedure vital signs reviewed and stable Respiratory status: spontaneous breathing, nonlabored ventilation, respiratory function stable and patient connected to nasal cannula oxygen Cardiovascular status: blood pressure returned to baseline and stable Postop Assessment: no apparent nausea or vomiting Anesthetic complications: no  No notable events documented.   Last Vitals:  Vitals:   01/01/23 0930 01/01/23 0941  BP: (!) 109/58 (!) 110/58  Pulse: 64 (!) 53  Resp: 12 16  Temp: (!) 36.1 C (!) 36.3 C  SpO2: 97% 97%    Last Pain:  Vitals:   01/01/23 0941  TempSrc: Temporal  PainSc: 4                  Stephanie Coup

## 2023-01-01 NOTE — Anesthesia Preprocedure Evaluation (Signed)
Anesthesia Evaluation  Patient identified by MRN, date of birth, ID band Patient awake    Reviewed: Allergy & Precautions, NPO status , Patient's Chart, lab work & pertinent test results  History of Anesthesia Complications Negative for: history of anesthetic complications  Airway Mallampati: III  TM Distance: <3 FB Neck ROM: full    Dental  (+) Poor Dentition, Missing, Dental Advidsory Given   Pulmonary neg shortness of breath, Current Smoker, former smoker   Pulmonary exam normal        Cardiovascular hypertension, (-) angina + CAD, + Past MI, + CABG and + Peripheral Vascular Disease  Normal cardiovascular exam(-) dysrhythmias      Neuro/Psych TIA Neuromuscular disease CVA  negative psych ROS   GI/Hepatic Neg liver ROS, hiatal hernia,GERD  Controlled and Medicated,,  Endo/Other  diabetes, Type 2    Renal/GU      Musculoskeletal   Abdominal   Peds  Hematology negative hematology ROS (+)   Anesthesia Other Findings Patient has cardiac clearance for this procedure.   Past Medical History: No date: Anginal pain (HCC) No date: Arthritis No date: Carpal tunnel syndrome of right wrist No date: CHF (congestive heart failure) (HCC)     Comment:  a.)  TTE 05/08/2016: EF 55%; trivial MR, mild TR/PR;               G1DD.  b.)  TTE 11/10/2017: EF 55 to 60%; trivial MR;               apical HK; G1DD. c.)  TTE 12/13/2018: EF 50-55%;               periapical HK; mild LA enlargement. d.)  TTE 05/11/2020               EF 50 to 60%; mild MR. e.)  TTE 08/03/2021: EF >55%; triv              MR/TR, mild PR; G1DD 08/09/2009: Coronary artery disease     Comment:  a.) NSTEMI 08/09/2009 --> LHC: 30% pLAD, 100% mLAD, 30%               mLCx, 70% OM1, 20% pRCA, 20% mRCA, 30% dRCA, 100% RPDA;               no intervention. b.) LHC 08/11/2013: 100% mLAD, 90% pLCx,              75% OM1, 100% RPDA; refer to CVTS. c.) 5v CABG 08/20/2013 No  date: DDD (degenerative disc disease), lumbar No date: Diverticulosis No date: Full dentures No date: GERD (gastroesophageal reflux disease) No date: Hiatal hernia No date: HLD (hyperlipidemia) No date: HTN (hypertension) 05/10/2020: Lacunar infarct, acute (HCC) No date: Long term current use of antithrombotics/antiplatelets     Comment:  a.) dipyridamole-aspirin No date: Lumbar radiculopathy 11/10/2017: Mural thrombus of cardiac apex     Comment:  a.) TTE 11/10/2017: small LV apex thrombus measuring 10               x 10 mm No date: Neuropathy 08/09/2009: NSTEMI (non-ST elevated myocardial infarction) [facility])     Comment:  a.) LHC 08/09/2009: 30% pLAD, 100% mLAD, 30% mLCx, 70%               OM1, 20% pRCA, 20% mRCA, 30% dRCA, 100% RPDA;               intervention deferred opting for aggressive medical  management. 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 Rosita Kea office 10/20/2014: CARPAL TUNNEL RELEASE; Left     Comment:  Procedure: CARPAL TUNNEL RELEASE;  Surgeon: Kennedy Bucker, MD;  Location: [facility];  Service: Orthopedics;                Laterality: Left; No date: CATARACT EXTRACTION W/ INTRAOCULAR LENS IMPLANT; Bilateral 08/20/2013: CORONARY ARTERY BYPASS GRAFT; N/A     Comment:  Procedure: 5v CORONARY ARTERY BYPASS GRAFT; Location:               [facility]; Surgeon: Wilburt Finlay, MD 10/26/2015: ESOPHAGOGASTRODUODENOSCOPY  (EGD) WITH PROPOFOL; N/A     Comment:  Procedure: ESOPHAGOGASTRODUODENOSCOPY (EGD) WITH               PROPOFOL with dialation;  Surgeon: [name], MD;                Location: [facility];  Service: Endoscopy;                Laterality: N/A; 08/09/2009: LEFT HEART CATH AND CORONARY ANGIOGRAPHY; Left     Comment:  Procedure: LEFT HEART CATH AND CORONARY ANGIOGRAPHY;               Location: [facility]; Surgeon: Arnoldo Hooker, MD 08/11/2013: LEFT HEART CATH AND CORONARY ANGIOGRAPHY; Left     Comment:  Procedure: LEFT HEART CATH AND CORONARY ANGIOGRAPHY;               Location: [facility]; Surgeon: Harold Hedge, MD 05/06/2018: LUMBAR LAMINECTOMY/DECOMPRESSION MICRODISCECTOMY;  Bilateral     Comment:  Procedure: Bilateral Lumbar Four-Five               Laminectomy/Foraminotomy;  Surgeon: Barnett Abu, MD;                Location: [facility];  Service: Neurosurgery;  Laterality:               Bilateral;  Bilateral Lumbar 4-5 Laminectomy/Foraminotomy No date: NECK SURGERY; N/A     Comment:  neck x 2, one discectomy and one shaved disc. 06/06/2015: SHOULDER ARTHROSCOPY; Right     Comment:  Procedure: right shoulder arthroscopy, arthroscopic               debridement, subacromial decompression, SLAP repair,               biotenodesis, mini open rotator cuff repair;  Surgeon:               Christena Flake, MD;  Location: [facility];  Service:               Orthopedics;  Laterality: Right; No date: SHOULDER OPEN ROTATOR CUFF REPAIR; Left  BMI  Body Mass Index: 29.95 kg/m      Reproductive/Obstetrics negative OB ROS                             Anesthesia Physical Anesthesia Plan  ASA: 3  Anesthesia Plan:    Post-op Pain Management:    Induction: Intravenous  PONV Risk Score and Plan: 2 and Ondansetron, Dexamethasone and Midazolam  Airway Management Planned: LMA  Additional Equipment:   Intra-op Plan:   Post-operative Plan: Extubation in OR  Informed  Consent: I have reviewed the patients History and Physical, chart, labs and discussed the procedure including the risks, benefits and alternatives for the proposed anesthesia with the patient or authorized representative who has indicated his/her understanding and acceptance.     Dental Advisory Given  Plan Discussed with: Anesthesiologist, CRNA and Surgeon  Anesthesia Plan Comments: (Patient consented for risks of anesthesia including but not limited to:  - adverse reactions to medications - damage to eyes, teeth, lips or other oral mucosa - nerve damage due to positioning  - sore throat or hoarseness - Damage to heart, brain, nerves, lungs, other parts of body or loss of life  Patient voiced understanding.)       Anesthesia Quick Evaluation

## 2023-01-03 ENCOUNTER — Other Ambulatory Visit: Payer: Medicare HMO

## 2023-05-09 ENCOUNTER — Other Ambulatory Visit: Payer: Self-pay

## 2023-05-09 ENCOUNTER — Emergency Department: Payer: Medicare HMO

## 2023-05-09 ENCOUNTER — Observation Stay
Admission: EM | Admit: 2023-05-09 | Discharge: 2023-05-12 | Disposition: A | Discharge status: Home / Self Care | Payer: Medicare HMO | Attending: Emergency Medicine | Admitting: Emergency Medicine

## 2023-05-09 ENCOUNTER — Encounter: Payer: Self-pay | Admitting: Emergency Medicine

## 2023-05-09 DIAGNOSIS — Z8679 Personal history of other diseases of the circulatory system: Secondary | ICD-10-CM | POA: Diagnosis not present

## 2023-05-09 DIAGNOSIS — I5030 Unspecified diastolic (congestive) heart failure: Secondary | ICD-10-CM | POA: Insufficient documentation

## 2023-05-09 DIAGNOSIS — F17210 Nicotine dependence, cigarettes, uncomplicated: Secondary | ICD-10-CM | POA: Insufficient documentation

## 2023-05-09 DIAGNOSIS — E119 Type 2 diabetes mellitus without complications: Secondary | ICD-10-CM

## 2023-05-09 DIAGNOSIS — W1830XA Fall on same level, unspecified, initial encounter: Secondary | ICD-10-CM | POA: Diagnosis present

## 2023-05-09 DIAGNOSIS — G894 Chronic pain syndrome: Secondary | ICD-10-CM | POA: Diagnosis not present

## 2023-05-09 DIAGNOSIS — E118 Type 2 diabetes mellitus with unspecified complications: Secondary | ICD-10-CM | POA: Diagnosis present

## 2023-05-09 DIAGNOSIS — R55 Syncope and collapse: Principal | ICD-10-CM

## 2023-05-09 DIAGNOSIS — Z8673 Personal history of transient ischemic attack (TIA), and cerebral infarction without residual deficits: Secondary | ICD-10-CM | POA: Diagnosis not present

## 2023-05-09 DIAGNOSIS — E1142 Type 2 diabetes mellitus with diabetic polyneuropathy: Secondary | ICD-10-CM | POA: Insufficient documentation

## 2023-05-09 DIAGNOSIS — Z951 Presence of aortocoronary bypass graft: Secondary | ICD-10-CM | POA: Insufficient documentation

## 2023-05-09 DIAGNOSIS — I2510 Atherosclerotic heart disease of native coronary artery without angina pectoris: Secondary | ICD-10-CM | POA: Diagnosis present

## 2023-05-09 DIAGNOSIS — R001 Bradycardia, unspecified: Secondary | ICD-10-CM

## 2023-05-09 DIAGNOSIS — I110 Hypertensive heart disease with heart failure: Secondary | ICD-10-CM | POA: Insufficient documentation

## 2023-05-09 DIAGNOSIS — I959 Hypotension, unspecified: Secondary | ICD-10-CM | POA: Diagnosis not present

## 2023-05-09 DIAGNOSIS — G629 Polyneuropathy, unspecified: Secondary | ICD-10-CM

## 2023-05-09 DIAGNOSIS — E785 Hyperlipidemia, unspecified: Secondary | ICD-10-CM | POA: Insufficient documentation

## 2023-05-09 LAB — COMPREHENSIVE METABOLIC PANEL
ALT: 23 U/L (ref 0–44)
AST: 23 U/L (ref 15–41)
Albumin: 3.8 g/dL (ref 3.5–5.0)
Alkaline Phosphatase: 75 U/L (ref 38–126)
Anion gap: 7 (ref 5–15)
BUN: 14 mg/dL (ref 8–23)
CO2: 28 mmol/L (ref 22–32)
Calcium: 9.4 mg/dL (ref 8.9–10.3)
Chloride: 101 mmol/L (ref 98–111)
Creatinine, Ser: 0.76 mg/dL (ref 0.61–1.24)
GFR, Estimated: >60 mL/min (ref >60)
Glucose, Bld: 143 mg/dL — ABNORMAL HIGH (ref 70–99)
Potassium: 4 mmol/L (ref 3.5–5.1)
Sodium: 136 mmol/L (ref 135–145)
Total Bilirubin: 1 mg/dL (ref <1.2)
Total Protein: 6.9 g/dL (ref 6.5–8.1)

## 2023-05-09 LAB — URINALYSIS, W/ REFLEX TO CULTURE (INFECTION SUSPECTED)
Bilirubin Urine: NEGATIVE
Glucose, UA: 50 mg/dL — AB
Hgb urine dipstick: NEGATIVE
Ketones, ur: NEGATIVE mg/dL
Leukocytes,Ua: NEGATIVE
Nitrite: NEGATIVE
Protein, ur: NEGATIVE mg/dL
Specific Gravity, Urine: 1.012 (ref 1.005–1.030)
Squamous Epithelial / HPF: 0 /[HPF] (ref 0–5)
pH: 7 (ref 5.0–8.0)

## 2023-05-09 LAB — CBC WITH DIFFERENTIAL/PLATELET
Abs Immature Granulocytes: 0.04 10*3/uL (ref 0.00–0.07)
Basophils Absolute: 0.1 10*3/uL (ref 0.0–0.1)
Basophils Relative: 1 %
Eosinophils Absolute: 0.2 10*3/uL (ref 0.0–0.5)
Eosinophils Relative: 3 %
HCT: 40 % (ref 39.0–52.0)
Hemoglobin: 14.3 g/dL (ref 13.0–17.0)
Immature Granulocytes: 1 %
Lymphocytes Relative: 43 %
Lymphs Abs: 2.8 10*3/uL (ref 0.7–4.0)
MCH: 31.2 pg (ref 26.0–34.0)
MCHC: 35.8 g/dL (ref 30.0–36.0)
MCV: 87.1 fL (ref 80.0–100.0)
Monocytes Absolute: 0.7 10*3/uL (ref 0.1–1.0)
Monocytes Relative: 11 %
Neutro Abs: 2.7 10*3/uL (ref 1.7–7.7)
Neutrophils Relative %: 41 %
Platelets: 222 10*3/uL (ref 150–400)
RBC: 4.59 MIL/uL (ref 4.22–5.81)
RDW: 13 % (ref 11.5–15.5)
WBC: 6.4 10*3/uL (ref 4.0–10.5)
nRBC: 0 % (ref 0.0–0.2)

## 2023-05-09 LAB — TSH: TSH: 1.919 u[IU]/mL (ref 0.350–4.500)

## 2023-05-09 LAB — HEMOGLOBIN A1C
Hgb A1c MFr Bld: 8.1 % — ABNORMAL HIGH (ref 4.8–5.6)
Mean Plasma Glucose: 185.77 mg/dL

## 2023-05-09 LAB — GLUCOSE, CAPILLARY: Glucose-Capillary: 238 mg/dL — ABNORMAL HIGH (ref 70–99)

## 2023-05-09 LAB — TROPONIN I (HIGH SENSITIVITY)
Troponin I (High Sensitivity): 9 ng/L (ref <18)
Troponin I (High Sensitivity): 10 ng/L (ref <18)

## 2023-05-09 LAB — MAGNESIUM: Magnesium: 2.1 mg/dL (ref 1.7–2.4)

## 2023-05-09 MED ORDER — SENNA 8.6 MG PO TABS
1.0000 | ORAL_TABLET | Freq: Every day | ORAL | Status: DC
  Administered 2023-05-09 – 2023-05-10 (×2): 8.6 mg via ORAL
  Filled 2023-05-09 (×3): qty 1

## 2023-05-09 MED ORDER — TAMSULOSIN HCL 0.4 MG PO CAPS
0.4000 mg | ORAL_CAPSULE | Freq: Every day | ORAL | Status: DC
  Administered 2023-05-09 – 2023-05-11 (×3): 0.4 mg via ORAL
  Filled 2023-05-09 (×3): qty 1

## 2023-05-09 MED ORDER — ACETAMINOPHEN 325 MG RE SUPP: 650.0000 mg | Freq: Four times a day (QID) | RECTAL | Status: DC | PRN

## 2023-05-09 MED ORDER — GABAPENTIN 300 MG PO CAPS
300.0000 mg | ORAL_CAPSULE | Freq: Every morning | ORAL | Status: DC
  Administered 2023-05-10 – 2023-05-12 (×3): 300 mg via ORAL
  Filled 2023-05-09 (×3): qty 1

## 2023-05-09 MED ORDER — ACETAMINOPHEN 325 MG PO TABS
650.0000 mg | ORAL_TABLET | Freq: Four times a day (QID) | ORAL | Status: DC
  Administered 2023-05-09 – 2023-05-12 (×10): 650 mg via ORAL
  Filled 2023-05-09 (×11): qty 2

## 2023-05-09 MED ORDER — PANTOPRAZOLE SODIUM 40 MG PO TBEC
40.0000 mg | DELAYED_RELEASE_TABLET | Freq: Every morning | ORAL | Status: DC
  Administered 2023-05-10 – 2023-05-12 (×3): 40 mg via ORAL
  Filled 2023-05-09 (×3): qty 1

## 2023-05-09 MED ORDER — SODIUM CHLORIDE 0.9 % IV BOLUS
500.0000 mL | Freq: Once | INTRAVENOUS | Status: AC
  Administered 2023-05-09: 500 mL via INTRAVENOUS

## 2023-05-09 MED ORDER — OXYCODONE-ACETAMINOPHEN 5-325 MG PO TABS
2.0000 | ORAL_TABLET | Freq: Once | ORAL | Status: AC
Start: 2023-05-09 — End: 2023-05-09
  Administered 2023-05-09: 2 via ORAL
  Filled 2023-05-09: qty 2

## 2023-05-09 MED ORDER — ONDANSETRON HCL 4 MG PO TABS: 4.0000 mg | ORAL_TABLET | Freq: Four times a day (QID) | ORAL | Status: DC | PRN

## 2023-05-09 MED ORDER — ENOXAPARIN SODIUM 40 MG/0.4ML IJ SOSY
40.0000 mg | PREFILLED_SYRINGE | INTRAMUSCULAR | Status: DC
Start: 2023-05-09 — End: 2023-05-12
  Administered 2023-05-09 – 2023-05-11 (×3): 40 mg via SUBCUTANEOUS
  Filled 2023-05-09 (×3): qty 0.4

## 2023-05-09 MED ORDER — GABAPENTIN 300 MG PO CAPS
600.0000 mg | ORAL_CAPSULE | Freq: Every evening | ORAL | Status: DC
  Administered 2023-05-09 – 2023-05-11 (×3): 600 mg via ORAL
  Filled 2023-05-09 (×3): qty 2

## 2023-05-09 MED ORDER — MORPHINE SULFATE (PF) 4 MG/ML IV SOLN
4.0000 mg | Freq: Once | INTRAVENOUS | Status: AC
  Administered 2023-05-09: 4 mg via INTRAVENOUS
  Filled 2023-05-09: qty 1

## 2023-05-09 MED ORDER — ASPIRIN-DIPYRIDAMOLE ER 25-200 MG PO CP12
1.0000 | ORAL_CAPSULE | Freq: Two times a day (BID) | ORAL | Status: DC
Start: 2023-05-09 — End: 2023-05-12
  Administered 2023-05-09 – 2023-05-12 (×6): 1 via ORAL
  Filled 2023-05-09 (×7): qty 1

## 2023-05-09 MED ORDER — GABAPENTIN 300 MG PO CAPS: 300.0000 mg | ORAL_CAPSULE | Freq: Two times a day (BID) | ORAL | Status: DC

## 2023-05-09 MED ORDER — ACETAMINOPHEN 325 MG PO TABS: 650.0000 mg | ORAL_TABLET | Freq: Four times a day (QID) | ORAL | Status: DC | PRN

## 2023-05-09 MED ORDER — DICYCLOMINE HCL 10 MG PO CAPS
10.0000 mg | ORAL_CAPSULE | Freq: Two times a day (BID) | ORAL | Status: DC
  Administered 2023-05-09 – 2023-05-12 (×6): 10 mg via ORAL
  Filled 2023-05-09 (×7): qty 1

## 2023-05-09 MED ORDER — OXYCODONE HCL 5 MG PO TABS
10.0000 mg | ORAL_TABLET | Freq: Four times a day (QID) | ORAL | Status: DC | PRN
  Administered 2023-05-09 – 2023-05-12 (×6): 10 mg via ORAL
  Filled 2023-05-09 (×7): qty 2

## 2023-05-09 MED ORDER — POLYETHYLENE GLYCOL 3350 17 G PO PACK: 17.0000 g | PACK | Freq: Every day | ORAL | Status: DC | PRN

## 2023-05-09 MED ORDER — SODIUM CHLORIDE 0.9% FLUSH
3.0000 mL | Freq: Two times a day (BID) | INTRAVENOUS | Status: DC
  Administered 2023-05-09 – 2023-05-11 (×6): 3 mL via INTRAVENOUS

## 2023-05-09 MED ORDER — INSULIN ASPART 100 UNIT/ML IJ SOLN
0.0000 [IU] | Freq: Three times a day (TID) | INTRAMUSCULAR | Status: DC
  Administered 2023-05-10: 3 [IU] via SUBCUTANEOUS
  Administered 2023-05-10 – 2023-05-12 (×6): 2 [IU] via SUBCUTANEOUS
  Filled 2023-05-09 (×7): qty 1

## 2023-05-09 MED ORDER — EZETIMIBE 10 MG PO TABS
10.0000 mg | ORAL_TABLET | Freq: Every day | ORAL | Status: DC
  Administered 2023-05-10 – 2023-05-12 (×3): 10 mg via ORAL
  Filled 2023-05-09 (×3): qty 1

## 2023-05-09 MED ORDER — CYCLOBENZAPRINE HCL 10 MG PO TABS
10.0000 mg | ORAL_TABLET | Freq: Two times a day (BID) | ORAL | Status: DC | PRN
  Administered 2023-05-09: 10 mg via ORAL
  Filled 2023-05-09: qty 1

## 2023-05-09 MED ORDER — ONDANSETRON HCL 4 MG/2ML IJ SOLN
4.0000 mg | Freq: Four times a day (QID) | INTRAMUSCULAR | Status: DC | PRN
Start: 2023-05-09 — End: 2023-05-12

## 2023-05-09 NOTE — ED Notes (Signed)
Pt placed on zoll pads d/t low HR dipping to 40s

## 2023-05-09 NOTE — Assessment & Plan Note (Signed)
-   Continue home oxycodone with additional doses available given acute pain
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
## 2023-05-09 NOTE — ED Notes (Signed)
RN attempting to assist pt with using urinal in bed. Pt unable to do so but states urgency to still urinate. This RN and paramedic student assisted pt to standing position at bedside. Pt with difficulty standing and reports needing to sit back down due to hip pain. Pt UO 

## 2023-05-09 NOTE — Assessment & Plan Note (Signed)
-   Continue home regimen 

## 2023-05-09 NOTE — Assessment & Plan Note (Addendum)
On arrival, patient was noted to be bradycardic with sinus rhythm.  No prior history of such.  Although he is on oxycodone, he has been on this for many years so would not expect this to cause bradycardia now.  No other AV nodal blocking agents.  No chest pain.  EKG with some subtle changes, however EDP discussed with STEMI; does not meet STEMI criteria.  - Telemetry monitoring - Cardiology consulted; appreciate their recommendations - Atropine as needed if symptomatic or heart rate below 40 - Echocardiogram ordered - Check TSH and magnesium

## 2023-05-09 NOTE — ED Triage Notes (Signed)
Patient brought in from home tonight via [facility] after a fall tonight. Patient is unsure what caused him to fall, states he is unsure if he hit his head. Patient is not on blood thinners. Currently complaints of right hip pain down to the knee. Pulses intact.

## 2023-05-09 NOTE — ED Notes (Addendum)
Pt had been given urinal in bed by other staff. This RN entering room to pt at end of bed attempting to exit and puilling all monitoring cords off. Pt assisted back in back. Monitoring cords back in place. Bed alarm activated due to pt being high fall risk. Pt educated on importance to stay in bed unless assisted by staff. Pt HR once back in bed noted to in the 40s again. Pt denies new symptoms. EDP made aware.

## 2023-05-09 NOTE — ED Provider Triage Note (Signed)
Emergency Medicine Provider Triage Evaluation Note  [name] [name] , a 77 y.o. male  was evaluated in triage.  Pt complains of fall that occurred last night. Got up to go to the bathroom and fell. Wife helped him to the chair and patient reports that his right leg hurt so he couldn't stand. Patient reports pain in his right hip and right knee, reports that he is supposed to get hip surgery but was told he needs to bring his A1C down below 8. Denies paresthesias or weakness. Reports right leg pain now "from the hip to the knee." Thinks he hit his right hip on a "steel desk" in his room.   Review of Systems  Positive: Hip pain, knee pain Negative: Chest pain, sob, abd pain, n/v/d  Physical Exam  BP 128/69 (BP Location: Right Arm)   Pulse (!) 54   Temp 97.7 F (36.5 C) (Oral)   Resp 18   Ht 5\' 9"  (1.753 m)   Wt 89.8 kg   SpO2 95%   BMI 29.24 kg/m  Gen:   Awake, no distress   Resp:  Normal effort  MSK:   Pain with moving right hip, negative log roll bilaterally. Able to plantar flex bilaterally with normal strength Other:    Medical Decision Making  Medically screening exam initiated at 7:44 AM.  Appropriate orders placed.  [facility] [address] was informed that the remainder of the evaluation will be completed by another provider, this initial triage assessment does not replace that evaluation, and the importance of remaining in the ED until their evaluation is complete.     Jackelyn Hoehn, PA-C 05/09/23 0800

## 2023-05-09 NOTE — ED Notes (Signed)
Pt taken to CT.

## 2023-05-09 NOTE — Assessment & Plan Note (Signed)
History of CAD s/p CABG.  No reported chest pain at this time.  Troponins are negative x 2.  - Continue home regimen

## 2023-05-09 NOTE — Assessment & Plan Note (Signed)
Uncontrolled diabetes with last A1c of 8.4% approximately 1 month prior.  - Hold home regimen - SSI, moderate

## 2023-05-09 NOTE — Plan of Care (Signed)

## 2023-05-09 NOTE — Assessment & Plan Note (Signed)
Patient is presenting after ground-level fall in the middle of the night.  He states that he does not believe he lost consciousness, and if he did, it must have been very brief.  He is uncertain if he lost his footing, stating he had just woken up to use the restroom and was previously asleep.  He denies any prodromal symptoms.  - Telemetry monitoring - Echocardiogram ordered - PT/OT - Oxycodone and Tylenol as needed for hip pain; no evidence of fracture on imaging

## 2023-05-09 NOTE — ED Provider Notes (Signed)
[facility] Provider Note    Event Date/Time   First MD Initiated Contact with Patient 05/09/23 [phone]     (approximate)   History   Chief Complaint: Fall   HPI  [name] [name] is a 77 y.o. male with a history of CHF, CAD, diabetes, stroke who comes to the ED due to fall.  Patient was at home, reports getting up at night to go to the bathroom and then fell next to his bed.  Not sure why he fell, has poor recollection of events. Complains of pain in the right hip.  Denies chest pain or shortness of breath.          Physical Exam   Triage Vital Signs: ED Triage Vitals  Encounter Vitals Group     BP 05/09/23 0441 128/69     Systolic BP Percentile --      Diastolic BP Percentile --      Pulse Rate 05/09/23 0441 (!) 54     Resp 05/09/23 0441 18     Temp 05/09/23 0441 97.7 F (36.5 C)     Temp Source 05/09/23 0441 Oral     SpO2 05/09/23 0441 95 %     Weight 05/09/23 0444 198 lb (89.8 kg)     Height 05/09/23 0444 5\' 9"  (1.753 m)     Head Circumference --      Peak Flow --      Pain Score 05/09/23 0444 8     Pain Loc --      Pain Education --      Exclude from Growth Chart --     Most recent vital signs: Vitals:   05/09/23 1115 05/09/23 1124  BP: 112/65   Pulse:    Resp: 12   Temp:  98.1 F (36.7 C)  SpO2:  100%    General: Awake, no distress.  CV:  Good peripheral perfusion.  Regular rate and rhythm, heart rate 60.  Normal distal pulses Resp:  Normal effort.  Clear to auscultation bilaterally Abd:  No distention.  Soft nontender Other:  Mild tenderness at the right hip.  Intact range of motion.  No limb shortening.   ED Results / Procedures / Treatments   Labs (all labs ordered are listed, but only abnormal results are displayed) Labs Reviewed  COMPREHENSIVE METABOLIC PANEL - Abnormal; Notable for the following components:      Result Value   Glucose, Bld 143 (*)    All other components within normal limits  URINALYSIS, W/  REFLEX TO CULTURE (INFECTION SUSPECTED) - Abnormal; Notable for the following components:   Color, Urine YELLOW (*)    APPearance HAZY (*)    Glucose, UA 50 (*)    Bacteria, UA RARE (*)    All other components within normal limits  CBC WITH DIFFERENTIAL/PLATELET  TROPONIN I (HIGH SENSITIVITY)  TROPONIN I (HIGH SENSITIVITY)     EKG Interpreted by me Sinus rhythm rate of 64.  Left axis, normal intervals.  Normal QRS.  Slight ST elevation in 3 and aVF with aVL ST depression, similar in appearance to prior EKG on November 19, 2022  Repeat EKG in the ED shows no evolving ST changes.  Patient now in sinus bradycardia with a heart rate of 41.   RADIOLOGY X-ray right hip interpreted by me, negative for fracture, radiology report reviewed.  CT head and cervical spine unremarkable.   PROCEDURES:  Procedures   MEDICATIONS ORDERED IN ED: Medications  oxyCODONE-acetaminophen (PERCOCET/ROXICET)  5-325 MG per tablet 2 tablet (2 tablets Oral Given 05/09/23 0931)  sodium chloride 0.9 % bolus 500 mL (0 mLs Intravenous Stopped 05/09/23 1045)  morphine (PF) 4 MG/ML injection 4 mg (4 mg Intravenous Given 05/09/23 1207)     IMPRESSION / MDM / ASSESSMENT AND PLAN / ED COURSE  I reviewed the triage vital signs and the nursing notes.  DDx: Arrhythmia, dehydration, electrolyte abnormality, hyperglycemia, non-STEMI, intracranial hemorrhage, C-spine fracture, hip fracture  Patient's presentation is most consistent with acute presentation with potential threat to life or bodily function.  Patient with poorly controlled diabetes comes to the ED complaining of fall at home, suspected syncope.  He also feels dehydrated and reports chronic poorly controlled diabetes, recently started Ozempic.  Will check labs.  Trauma workup unremarkable, but still complaining of 8/10 pain despite negative x-ray of the right hip, will obtain CT of the hip.   Clinical Course as of 05/09/23 1228  Fri May 09, 2023  1134  Initial labs unremarkable, patient having persistent bradycardia with heart rate in the low 40s.  Suspect this may have caused his fall.   [PS]    Clinical Course User Index [PS] Sharman Cheek, MD   ----------------------------------------- 12:29 PM on 05/09/2023 ----------------------------------------- CT head unremarkable, negative for fracture.  Initial troponin negative.  EKGs discussed with STEMI cardiologist, nondiagnostic for acute STEMI and without chest pain or shortness of breath, would continue to monitor symptoms and trend troponin.  Case discussed with hospitalist for further evaluation.   FINAL CLINICAL IMPRESSION(S) / ED DIAGNOSES   Final diagnoses:  Syncope, unspecified syncope type  Bradycardia  Type 2 diabetes mellitus without complication, without long-term current use of insulin (HCC)     Rx / DC Orders   ED Discharge Orders     None        Note:  This document was prepared using Dragon voice recognition software and may include unintentional dictation errors.   Sharman Cheek, MD 05/09/23 1230

## 2023-05-09 NOTE — ED Notes (Signed)
Pt HR showing 40s on monitor. Pt denies new complaints. New EKG obtained. EDP made aware.
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
## 2023-05-10 ENCOUNTER — Observation Stay (HOSPITAL_BASED_OUTPATIENT_CLINIC_OR_DEPARTMENT_OTHER)
Admit: 2023-05-10 | Discharge: 2023-05-10 | Disposition: A | Discharge status: Home / Self Care | Payer: Medicare HMO | Attending: Internal Medicine

## 2023-05-10 DIAGNOSIS — I5030 Unspecified diastolic (congestive) heart failure: Secondary | ICD-10-CM | POA: Diagnosis not present

## 2023-05-10 DIAGNOSIS — R001 Bradycardia, unspecified: Secondary | ICD-10-CM | POA: Diagnosis not present

## 2023-05-10 DIAGNOSIS — G894 Chronic pain syndrome: Secondary | ICD-10-CM | POA: Diagnosis not present

## 2023-05-10 DIAGNOSIS — W1830XA Fall on same level, unspecified, initial encounter: Secondary | ICD-10-CM | POA: Diagnosis not present

## 2023-05-10 DIAGNOSIS — E118 Type 2 diabetes mellitus with unspecified complications: Secondary | ICD-10-CM | POA: Diagnosis not present

## 2023-05-10 DIAGNOSIS — I2510 Atherosclerotic heart disease of native coronary artery without angina pectoris: Secondary | ICD-10-CM

## 2023-05-10 DIAGNOSIS — R55 Syncope and collapse: Secondary | ICD-10-CM

## 2023-05-10 LAB — ECHOCARDIOGRAM COMPLETE
AR max vel: 3.43 cm2
AV Area VTI: 3.02 cm2
AV Area mean vel: 3.1 cm2
AV Mean grad: 2 mm[Hg]
AV Peak grad: 3.9 mm[Hg]
Ao pk vel: 0.99 m/s
Area-P 1/2: 3.65 cm2
Height: 69 in
S' Lateral: 3.1 cm
Weight: 2962.98 [oz_av]

## 2023-05-10 LAB — GLUCOSE, CAPILLARY
Glucose-Capillary: 133 mg/dL — ABNORMAL HIGH (ref 70–99)
Glucose-Capillary: 164 mg/dL — ABNORMAL HIGH (ref 70–99)
Glucose-Capillary: 146 mg/dL — ABNORMAL HIGH (ref 70–99)

## 2023-05-10 LAB — BASIC METABOLIC PANEL
Anion gap: 7 (ref 5–15)
BUN: 15 mg/dL (ref 8–23)
CO2: 27 mmol/L (ref 22–32)
Calcium: 9.3 mg/dL (ref 8.9–10.3)
Chloride: 102 mmol/L (ref 98–111)
Creatinine, Ser: 0.79 mg/dL (ref 0.61–1.24)
GFR, Estimated: >60 mL/min (ref >60)
Glucose, Bld: 131 mg/dL — ABNORMAL HIGH (ref 70–99)
Potassium: 4 mmol/L (ref 3.5–5.1)
Sodium: 136 mmol/L (ref 135–145)

## 2023-05-10 MED ORDER — PERFLUTREN LIPID MICROSPHERE
1.0000 mL | INTRAVENOUS | Status: AC | PRN
  Administered 2023-05-10: 4 mL via INTRAVENOUS

## 2023-05-10 MED ORDER — MIDODRINE HCL 5 MG PO TABS
10.0000 mg | ORAL_TABLET | Freq: Three times a day (TID) | ORAL | Status: DC
  Administered 2023-05-10 – 2023-05-12 (×8): 10 mg via ORAL
  Filled 2023-05-10 (×8): qty 2

## 2023-05-10 MED ORDER — CYCLOBENZAPRINE HCL 10 MG PO TABS
10.0000 mg | ORAL_TABLET | Freq: Once | ORAL | Status: AC
  Administered 2023-05-10: 10 mg via ORAL
  Filled 2023-05-10: qty 1

## 2023-05-10 MED ORDER — CYCLOBENZAPRINE HCL 10 MG PO TABS: 5.0000 mg | ORAL_TABLET | Freq: Once | ORAL | Status: DC

## 2023-05-10 MED ORDER — MUSCLE RUB 10-15 % EX CREA
1.0000 | TOPICAL_CREAM | CUTANEOUS | Status: DC | PRN
  Administered 2023-05-10: 1 via TOPICAL
  Filled 2023-05-10: qty 85

## 2023-05-10 NOTE — Progress Notes (Signed)
[facility] Progress Note  Patient: [name] [name]    UJW:[id number]  DOA: 05/09/2023     Date of Service: the patient was seen and examined on 05/10/2023  Chief Complaint  Patient presents with   Fall   Brief hospital course: [name] [name] is a 77 y.o. male with medical history significant of CAD s/p CABG (2015), HFpEF, PAD, CVA, type 2 diabetes with peripheral neuropathy, hypertension, hyperlipidemia, chronic pain syndrome on Oxycodone, who presents to the ED due to ground-level fall.   [name] [name] states he was in his usual state of health when he got up in the middle of the night to use the restroom.  After getting up and taking a few steps, he noted that he fell to the ground landing onto his right side.  He does not believe he lost consciousness, and states if he did it must have been a very brief.  He denies any head trauma.  He was able to help himself up using a chair but noticed he was having significant right leg pain and hip pain.  Due to this, he was unable to bear weight.  Prior to or after the fall, he denies any dizziness, shortness of breath, chest pain or palpitations.  He denies any recent illnesses, fever, chills, nausea, vomiting, diarrhea, dysuria, urinary frequency.   ED course: On arrival to the ED, patient was normotensive at 128/69 with heart rate of 54.  He was saturating at 95% on room air.  He was afebrile at 97.7.  Initial workup notable for normal CBC, troponin x 2, CMP with the exception of a glucose of 143.  Imaging including femur x-ray, CT head, CT hip, and CT C-spine with no acute findings.  Urinalysis with glucosuria and rare bacteria but no WBC, leukocytes or nitrites.  Due to persistent bradycardia, [facility] contacted for admission.   Assessment and Plan: Ground-level fall Patient is presenting after ground-level fall in the middle of the night.  He states that he does not believe he lost consciousness, and if he did, it must have been very brief.   He is uncertain if he lost his footing, stating he had just woken up to use the restroom and was previously asleep.  He denies any prodromal symptoms.   - Telemetry monitoring - Echocardiogram ordered - PT/OT - Oxycodone and Tylenol as needed for hip pain; no evidence of fracture on imaging   Sinus bradycardia On arrival, patient was noted to be bradycardic with sinus rhythm.  No prior history of such.  Although he is on oxycodone, he has been on this for many years so would not expect this to cause bradycardia now.  No other AV nodal blocking agents.  No chest pain.  EKG with some subtle changes, however EDP discussed with STEMI; does not meet STEMI criteria.   - Telemetry monitoring - Cardiology consulted; appreciate their recommendations - Atropine as needed if symptomatic or heart rate below 40 - Echocardiogram ordered - TSH and magnesium wnl   Hypotension Started midodrine 10 mg p.o. 3 times daily with holding parameters Monitor BP and titrate medication accordingly   Chronic pain syndrome - Continue home oxycodone with additional doses available given acute pain   Type 2 diabetes mellitus with complication, without long-term current use of insulin (HCC) Uncontrolled diabetes with last A1c of 8.4% approximately 1 month prior.   - Hold home regimen - SSI, moderate   Acquired polyneuropathy - Continue home regimen   CAD in native artery  History of CAD s/p CABG.  No reported chest pain at this time.  Troponins are negative x 2.   - Continue home regimen    Body mass index is 27.35 kg/m.  Interventions:  Diet: Heart healthy/carb modified diet DVT Prophylaxis: Subcutaneous Lovenox   Advance goals of care discussion: Full code  Family Communication: family was not present at bedside, at the time of interview.  The pt provided permission to discuss medical plan with the family. Opportunity was given to ask question and all questions were answered satisfactorily.    Disposition:  Pt is from Home, admitted with symptomatic bradycardia, still has bradycardia, which precludes a safe discharge. Discharge to home, when stable and cleared by cardiology.  Subjective: No significant events overnight, patient denies any chest pain or palpitation, just complaining of feeling weak in his legs overall he is feeling fine.   Physical Exam: General: NAD, lying comfortably Appear in no distress, affect appropriate Eyes: PERRLA ENT: Oral Mucosa Clear, moist  Neck: no JVD,  Cardiovascular: Bradycardia, S1 and S2 Present, no Murmur,  Respiratory: good respiratory effort, Bilateral Air entry equal and Decreased, no Crackles, no wheezes Abdomen: Bowel Sound present, Soft and no tenderness,  Skin: no rashes Extremities: no Pedal edema, no calf tenderness Neurologic: without any new focal findings Gait not checked due to patient safety concerns  Vitals:   05/10/23 0315 05/10/23 0500 05/10/23 0815 05/10/23 1303  BP: (!) 106/55  (!) 93/56 (!) 100/52  Pulse: (!) 58  (!) 46 (!) 53  Resp: 18  16 16   Temp: 98.1 F (36.7 C)  97.6 F (36.4 C) 97.7 F (36.5 C)  TempSrc: Oral   Oral  SpO2: 99%  97% 97%  Weight:  84 kg    Height:       No intake or output data in the 24 hours ending 05/10/23 1542 Filed Weights   05/09/23 0444 05/10/23 0500  Weight: 89.8 kg 84 kg    Data Reviewed: I have personally reviewed and interpreted daily labs, tele strips, imagings as discussed above. I reviewed all nursing notes, pharmacy notes, vitals, pertinent old records I have discussed plan of care as described above with RN and patient/family.  CBC: Recent Labs  Lab 05/09/23 0930  WBC 6.4  NEUTROABS 2.7  HGB 14.3  HCT 40.0  MCV 87.1  PLT 222   Basic Metabolic Panel: Recent Labs  Lab 05/09/23 0930 05/09/23 1127 05/10/23 0533  NA 136  --  136  K 4.0  --  4.0  CL 101  --  102  CO2 28  --  27  GLUCOSE 143*  --  131*  BUN 14  --  15  CREATININE 0.76  --  0.79   CALCIUM 9.4  --  9.3  MG  --  2.1  --     Studies: ECHOCARDIOGRAM COMPLETE  Result Date: 05/10/2023    ECHOCARDIOGRAM REPORT   Patient Name:   [name] [name] Date of Exam: 05/10/2023 Medical Rec #:  [id number]         Height:       69.0 in Accession #:    [id number]        Weight:       185.2 lb Date of Birth:  01-10-1946         BSA:          2.000 m Patient Age:    77 years          BP:  142/67 mmHg Patient Gender: M                 HR:           49 bpm. Exam Location:  [facility] Procedure: 2D Echo, Cardiac Doppler, Color Doppler and Intracardiac            Opacification Agent Indications:     Syncope; CAD  History:         Patient has prior history of Echocardiogram examinations, most                  recent 05/11/2020. Cardiomyopathy, CAD and Previous Myocardial                  Infarction, Prior CABG, PAD, TIA and Stroke; Risk                  Factors:Diabetes and Current Smoker.  Sonographer:     Dondra Prader RVT RCS Referring Phys:  [id number] Verdene Lennert Diagnosing Phys: Rozell Searing Custovic  Sonographer Comments: Suboptimal apical window. IMPRESSIONS  1. Left ventricular ejection fraction, by estimation, is 50 to 55%. The left ventricle has low normal function. The left ventricle has no regional wall motion abnormalities. Left ventricular diastolic parameters are consistent with Grade II diastolic dysfunction (pseudonormalization).  2. Right ventricular systolic function is normal. The right ventricular size is normal.  3. The mitral valve is normal in structure. Trivial mitral valve regurgitation. No evidence of mitral stenosis.  4. The aortic valve is normal in structure. Aortic valve regurgitation is not visualized. No aortic stenosis is present.  5. The inferior vena cava is normal in size with greater than 50% respiratory variability, suggesting right atrial pressure of 3 mmHg. FINDINGS  Left Ventricle: Left ventricular ejection fraction, by estimation, is 50 to 55%. The left ventricle has low  normal function. The left ventricle has no regional wall motion abnormalities. Definity contrast agent was given IV to delineate the left ventricular endocardial borders. The left ventricular internal cavity size was normal in size. There is no left ventricular hypertrophy. Left ventricular diastolic parameters are consistent with Grade II diastolic dysfunction (pseudonormalization). Right Ventricle: The right ventricular size is normal. No increase in right ventricular wall thickness. Right ventricular systolic function is normal. Left Atrium: Left atrial size was normal in size. Right Atrium: Right atrial size was normal in size. Pericardium: There is no evidence of pericardial effusion. Mitral Valve: The mitral valve is normal in structure. Trivial mitral valve regurgitation. No evidence of mitral valve stenosis. Tricuspid Valve: The tricuspid valve is normal in structure. Tricuspid valve regurgitation is trivial. No evidence of tricuspid stenosis. Aortic Valve: The aortic valve is normal in structure. Aortic valve regurgitation is not visualized. No aortic stenosis is present. Aortic valve mean gradient measures 2.0 mmHg. Aortic valve peak gradient measures 3.9 mmHg. Aortic valve area, by VTI measures 3.02 cm. Pulmonic Valve: The pulmonic valve was normal in structure. Pulmonic valve regurgitation is trivial. No evidence of pulmonic stenosis. Aorta: The aortic root is normal in size and structure. Venous: The inferior vena cava is normal in size with greater than 50% respiratory variability, suggesting right atrial pressure of 3 mmHg. IAS/Shunts: No atrial level shunt detected by color flow Doppler.  LEFT VENTRICLE PLAX 2D LVIDd:         4.20 cm   Diastology LVIDs:         3.10 cm   LV e' medial:    5.59 cm/s LV PW:  1.00 cm   LV E/e' medial:  15.6 LV IVS:        1.00 cm   LV e' lateral:   8.35 cm/s LVOT diam:     2.00 cm   LV E/e' lateral: 10.4 LV SV:         75 LV SV Index:   38 LVOT Area:     3.14 cm   RIGHT VENTRICLE             IVC RV S prime:     11.80 cm/s  IVC diam: 1.80 cm TAPSE (M-mode): 1.6 cm LEFT ATRIUM           Index        RIGHT ATRIUM           Index LA diam:      3.90 cm 1.95 cm/m   RA Area:     10.10 cm LA Vol (A2C): 32.7 ml 16.35 ml/m  RA Volume:   23.10 ml  11.55 ml/m LA Vol (A4C): 48.8 ml 24.41 ml/m  AORTIC VALVE                    PULMONIC VALVE AV Area (Vmax):    3.43 cm     PV Vmax:          1.10 m/s AV Area (Vmean):   3.10 cm     PV Peak grad:     4.8 mmHg AV Area (VTI):     3.02 cm     PR End Diast Vel: 2.73 msec AV Vmax:           98.80 cm/s AV Vmean:          66.300 cm/s AV VTI:            0.249 m AV Peak Grad:      3.9 mmHg AV Mean Grad:      2.0 mmHg LVOT Vmax:         108.00 cm/s LVOT Vmean:        65.500 cm/s LVOT VTI:          0.239 m LVOT/AV VTI ratio: 0.96  AORTA Ao Root diam: 3.50 cm Ao Asc diam:  3.60 cm MITRAL VALVE MV Area (PHT): 3.65 cm    SHUNTS MV Decel Time: 208 msec    Systemic VTI:  0.24 m MV E velocity: 87.20 cm/s  Systemic Diam: 2.00 cm MV A velocity: 95.20 cm/s MV E/A ratio:  0.92 Designer, multimedia signed by Clotilde Dieter Signature Date/Time: 05/10/2023/11:43:49 AM    Final     Scheduled Meds:  acetaminophen  650 mg Oral Q6H   dicyclomine  10 mg Oral BID   dipyridamole-aspirin  1 capsule Oral BID   enoxaparin (LOVENOX) injection  40 mg Subcutaneous Q24H   ezetimibe  10 mg Oral Daily   gabapentin  300 mg Oral q morning   And   gabapentin  600 mg Oral QPM   insulin aspart  0-15 Units Subcutaneous TID WC   midodrine  10 mg Oral TID WC   pantoprazole  40 mg Oral q morning   senna  1 tablet Oral QHS   sodium chloride flush  3 mL Intravenous Q12H   tamsulosin  0.4 mg Oral QPC supper   Continuous Infusions: PRN Meds: cyclobenzaprine, Muscle Rub, ondansetron **OR** ondansetron (ZOFRAN) IV, oxyCODONE, polyethylene glycol  Time spent: 35 minutes  Author: Gillis Santa. MD [facility] 05/10/2023 3:42 PM  To reach  On-call, see  care teams to locate the attending and reach out to them via [email or web address]. If 7PM-7AM, please contact night-coverage If you still have difficulty reaching the attending provider, please page the [facility] (Director on Call) for [facility] on amion for assistance.

## 2023-05-10 NOTE — Progress Notes (Signed)
*  PRELIMINARY RESULTS* Echocardiogram 2D Echocardiogram has been performed.  [name] [name] 05/10/2023, 10:32 AM

## 2023-05-10 NOTE — Evaluation (Signed)
Physical Therapy Evaluation Patient Details Name: [name] [name] MRN: [id number] DOB: 1945-06-16 Today's Date: 05/10/2023  History of Present Illness  77 y.o. male with medical history significant of CAD s/p CABG (2015), HFpEF, PAD, CVA, type 2 diabetes with peripheral neuropathy, hypertension, hyperlipidemia, chronic pain syndrome on Oxycodone, who presents to the ED due to ground-level fall.  Clinical Impression  Pt did relatively well with all aspects of PT exam, he needed only supervison/CGA for all mobility and did not have any overt LOBs or safety issues.  He has slow but safe gait, mild R limp that increased with attempts at single UE use ambulation (uses QC at baseline).  Of note pt's HR 49-60 t/o the effort, no lightheadness or other issues.  Pt will benefit from continued PT to address functional limitations.        If plan is discharge home, recommend the following: A little help with walking and/or transfers;A little help with bathing/dressing/bathroom;Assistance with cooking/housework;Assist for transportation;Help with stairs or ramp for entrance   Can travel by private vehicle        Equipment Recommendations Rolling walker (2 wheels)  Recommendations for Other Services       Functional Status Assessment Patient has had a recent decline in their functional status and demonstrates the ability to make significant improvements in function in a reasonable and predictable amount of time.     Precautions / Restrictions Precautions Precautions: Fall Restrictions Weight Bearing Restrictions: No      Mobility  Bed Mobility Overal bed mobility: Modified Independent             General bed mobility comments: able to get to sitting EOB w/o assist, dons socks b/l with minimal set up    Transfers Overall transfer level: Modified independent Equipment used: Rolling walker (2 wheels) Transfers: Sit to/from Stand Sit to Stand: Contact guard assist            General transfer comment: appropriate use of UEs, able to rise with relative confidence, no LOBs    Ambulation/Gait Ambulation/Gait assistance: Supervision Gait Distance (Feet): 100 Feet Assistive device: Rolling walker (2 wheels)         General Gait Details: slow but consistent cadence, no LOBs - ~20 ft with single HHA/hallway rail with increased R limp - recommending continued use of walker over baseline QC  Stairs            Wheelchair Mobility     Tilt Bed    Modified Rankin (Stroke Patients Only)       Balance Overall balance assessment: Mild deficits observed, not formally tested                                           Pertinent Vitals/Pain Pain Assessment Pain Assessment: No/denies pain    Home Living Family/patient expects to be discharged to:: Private residence Living Arrangements: Spouse/significant other Available Help at Discharge: Available PRN/intermittently Type of Home: House Home Access: Stairs to enter Entrance Stairs-Rails: Right Entrance Stairs-Number of Steps: 3   Home Layout: One level Home Equipment: Rollator (4 wheels);Cane - quad      Prior Function Prior Level of Function : Needs assist             Mobility Comments: No other falls in the last 6 months, typically uses QC but can do some ambulation w/o AD, will use 4WW when  hip is hurting ADLs Comments: Able to do self care, etc but manages all shopping, laundry, meals, etc     Extremity/Trunk Assessment   Upper Extremity Assessment Upper Extremity Assessment: Generalized weakness    Lower Extremity Assessment Lower Extremity Assessment: Generalized weakness    Cervical / Trunk Assessment Cervical / Trunk Assessment:  (forward head rounded houlses)  Communication   Communication Communication: Hearing impairment  Cognition Arousal: Alert Behavior During Therapy: WFL for tasks assessed/performed Overall Cognitive Status: History of cognitive  impairments - at baseline (mild cognitive impairment, wife reports "getting a little worse")                                          General Comments General comments (skin integrity, edema, etc.): HR 49-60 t/o the ambulation effort    Exercises     Assessment/Plan    PT Assessment Patient needs continued PT services  PT Problem List Decreased strength;Decreased range of motion;Decreased activity tolerance;Decreased balance;Decreased mobility;Decreased knowledge of use of DME;Decreased safety awareness;Cardiopulmonary status limiting activity       PT Treatment Interventions DME instruction;Gait training;Stair training;Therapeutic activities;Therapeutic exercise;Functional mobility training;Balance training;Cognitive remediation;Patient/family education    PT Goals (Current goals can be found in the Care Plan section)  Acute Rehab PT Goals Patient Stated Goal: go home PT Goal Formulation: With patient/family Time For Goal Achievement: 05/23/23 Potential to Achieve Goals: Good    Frequency Min 1X/week     Co-evaluation               AM-PAC PT "6 Clicks" Mobility  Outcome Measure Help needed turning from your back to your side while in a flat bed without using bedrails?: None Help needed moving from lying on your back to sitting on the side of a flat bed without using bedrails?: None Help needed moving to and from a bed to a chair (including a wheelchair)?: None Help needed standing up from a chair using your arms (e.g., wheelchair or bedside chair)?: None Help needed to walk in hospital room?: A Little Help needed climbing 3-5 steps with a railing? : A Little 6 Click Score: 22    End of Session Equipment Utilized During Treatment: Gait belt Activity Tolerance: Patient tolerated treatment well Patient left: with call bell/phone within reach Nurse Communication: Mobility status PT Visit Diagnosis: Difficulty in walking, not elsewhere classified  (R26.2);Unsteadiness on feet (R26.81);Muscle weakness (generalized) (M62.81)    Time: 0865-7846 PT Time Calculation (min) (ACUTE ONLY): 20 min   Charges:   PT Evaluation $PT Eval Low Complexity: 1 Low PT Treatments $Gait Training: 8-22 mins PT General Charges $$ ACUTE PT VISIT: 1 Visit         Malachi Pro, DPT 05/10/2023, 5:02 PM

## 2023-05-10 NOTE — Progress Notes (Signed)
OT Cancellation Note  Patient Details Name: KEEAN HILYARD MRN: [id number] DOB: Sep 26, 1945   Cancelled Treatment:    Reason Eval/Treat Not Completed: Patient at procedure or test/ unavailable. Chart reviewed, discussed with RN. Pt currently having an ECHO complete. Will re-attempt as able.  Taos Tapp L. Gabrielly Mccrystal, OTR/L  05/10/23, 10:07 AM

## 2023-05-10 NOTE — Evaluation (Signed)
Occupational Therapy Evaluation Patient Details Name: [name] [name] MRN: [id number] DOB: Jan 09, 1946 Today's Date: 05/10/2023   History of Present Illness 77 y.o. male with medical history significant of CAD s/p CABG (2015), HFpEF, PAD, CVA, type 2 diabetes with peripheral neuropathy, hypertension, hyperlipidemia, chronic pain syndrome on Oxycodone, who presents to the ED due to ground-level fall.   Clinical Impression   Prior to hospital admission, pt was using [facility] for mobility, mod independent for ADLs. Pt lives with spouse who performs IADLs and reports gradual cognitive decline. Pt currently requires setup for UB ADLs, CGA for LB ADLs, transfers and functional mobility with multimodal cuing (follows one step directions inconsistently).  Pt would benefit from skilled OT services to address noted impairments and functional limitations (see below for any additional details) in order to maximize safety and independence while minimizing falls risk and caregiver burden. Anticipate the need for follow up [facility] OT services upon acute hospital DC. Recommending continued assist for medication and financial management due to cognitive deficits.       If plan is discharge home, recommend the following: A little help with walking and/or transfers;A little help with bathing/dressing/bathroom;Assistance with cooking/housework;Direct supervision/assist for medications management;Direct supervision/assist for financial management;Assist for transportation;Supervision due to cognitive status;Help with stairs or ramp for entrance    Functional Status Assessment  Patient has had a recent decline in their functional status and demonstrates the ability to make significant improvements in function in a reasonable and predictable amount of time.  Equipment Recommendations  BSC/3in1       Precautions / Restrictions Precautions Precautions: Fall Restrictions Weight Bearing Restrictions: No      Mobility Bed  Mobility Overal bed mobility: Needs Assistance             General bed mobility comments: sitting EOB    Transfers Overall transfer level: Needs assistance Equipment used: Rolling walker (2 wheels) Transfers: Sit to/from Stand             General transfer comment: able to stand using RW from seated EOB, takes steps forwards/backwards/laterally CGA, cues for sequencing, poor RW mgmt and technique (ignores OT cues to push up with mutimodal cuing)      Balance Overall balance assessment: Mild deficits observed, not formally tested                                         ADL either performed or assessed with clinical judgement   ADL Overall ADL's : Needs assistance/impaired     Grooming: Set up;Sitting   Upper Body Bathing: Sitting;Set up   Lower Body Bathing: Sitting/lateral leans;Sit to/from stand;Contact guard assist   Upper Body Dressing : Set up;Cueing for sequencing;Sitting   Lower Body Dressing: Contact guard assist;Set up;Sit to/from stand;Sitting/lateral leans   Toilet Transfer: Contact guard assist;Rolling walker (2 wheels)   Toileting- Clothing Manipulation and Hygiene: Set up;Sit to/from stand;Sitting/lateral lean       Functional mobility during ADLs: Contact guard assist;Rolling walker (2 wheels)       Vision Baseline Vision/History: 1 Wears glasses Ability to See in Adequate Light: 0 Adequate Patient Visual Report: No change from baseline              Pertinent Vitals/Pain Pain Assessment Pain Assessment: No/denies pain     Extremity/Trunk Assessment Upper Extremity Assessment Upper Extremity Assessment: Generalized weakness   Lower Extremity Assessment Lower Extremity Assessment: Generalized  weakness   Cervical / Trunk Assessment Cervical / Trunk Assessment:  (forward head rounded houlses)   Communication Communication Communication: Hearing impairment   Cognition Arousal: Alert Behavior During Therapy: WFL  for tasks assessed/performed Overall Cognitive Status: History of cognitive impairments - at baseline (wife says cogniton getting a little worse over time)                                                  Home Living Family/patient expects to be discharged to:: Private residence Living Arrangements: Spouse/significant other Available Help at Discharge: Available PRN/intermittently Type of Home: House Home Access: Stairs to enter [employer] of Steps: 3 Entrance Stairs-Rails: Right Home Layout: One level     Bathroom Shower/Tub: Tub/shower unit;Walk-in shower   Bathroom Toilet: Handicapped height     Home Equipment: Rollator (4 wheels);Cane - quad          Prior Functioning/Environment Prior Level of Function : Needs assist             Mobility Comments: No other falls in the last 6 months, typically uses QC but can do some ambulation w/o AD, will use 4WW when hip is hurting ADLs Comments: Able to do self care, etc but manages all shopping, laundry, meals, etc        OT Problem List: Cardiopulmonary status limiting activity;Decreased strength;Decreased activity tolerance;Impaired balance (sitting and/or standing);Decreased cognition;Decreased safety awareness      OT Treatment/Interventions: Self-care/ADL training;Neuromuscular education;Energy conservation;DME and/or AE instruction;Therapeutic activities;Cognitive remediation/compensation;Patient/family education;Balance training    OT Goals(Current goals can be found in the care plan section) Acute Rehab OT Goals OT Goal Formulation: With patient/family Time For Goal Achievement: 05/24/23 Potential to Achieve Goals: Good  OT Frequency: Min 1X/week       AM-PAC OT "6 Clicks" Daily Activity     Outcome Measure Help from another person eating meals?: None Help from another person taking care of personal grooming?: None Help from another person toileting, which includes using toliet,  bedpan, or urinal?: A Little Help from another person bathing (including washing, rinsing, drying)?: A Little Help from another person to put on and taking off regular upper body clothing?: A Little Help from another person to put on and taking off regular lower body clothing?: A Little 6 Click Score: 20   End of Session Equipment Utilized During Treatment: Rolling walker (2 wheels) Nurse Communication: Mobility status  Activity Tolerance: Patient tolerated treatment well Patient left: in bed;with call bell/phone within reach;with nursing/sitter in room;with family/visitor present;with bed alarm set  OT Visit Diagnosis: Unsteadiness on feet (R26.81);History of falling (Z91.81);Muscle weakness (generalized) (M62.81)                Time: 1610-9604 OT Time Calculation (min): 8 min Charges:  OT General Charges $OT Visit: 1 Visit OT Evaluation $OT Eval Low Complexity: 1 Low  Alisan Dokes L. Garnetta Fedrick, OTR/L  05/10/23, 4:50 PM

## 2023-05-11 DIAGNOSIS — E118 Type 2 diabetes mellitus with unspecified complications: Secondary | ICD-10-CM | POA: Diagnosis not present

## 2023-05-11 DIAGNOSIS — G894 Chronic pain syndrome: Secondary | ICD-10-CM | POA: Diagnosis not present

## 2023-05-11 DIAGNOSIS — W1830XA Fall on same level, unspecified, initial encounter: Secondary | ICD-10-CM | POA: Diagnosis not present

## 2023-05-11 DIAGNOSIS — R001 Bradycardia, unspecified: Secondary | ICD-10-CM | POA: Diagnosis not present

## 2023-05-11 LAB — GLUCOSE, CAPILLARY
Glucose-Capillary: 145 mg/dL — ABNORMAL HIGH (ref 70–99)
Glucose-Capillary: 121 mg/dL — ABNORMAL HIGH (ref 70–99)
Glucose-Capillary: 137 mg/dL — ABNORMAL HIGH (ref 70–99)
Glucose-Capillary: 115 mg/dL — ABNORMAL HIGH (ref 70–99)

## 2023-05-11 LAB — BASIC METABOLIC PANEL
Anion gap: 9 (ref 5–15)
BUN: 15 mg/dL (ref 8–23)
CO2: 24 mmol/L (ref 22–32)
Calcium: 8.9 mg/dL (ref 8.9–10.3)
Chloride: 101 mmol/L (ref 98–111)
Creatinine, Ser: 0.78 mg/dL (ref 0.61–1.24)
GFR, Estimated: >60 mL/min (ref >60)
Glucose, Bld: 161 mg/dL — ABNORMAL HIGH (ref 70–99)
Potassium: 3.8 mmol/L (ref 3.5–5.1)
Sodium: 134 mmol/L — ABNORMAL LOW (ref 135–145)

## 2023-05-11 LAB — CBC
HCT: 36.5 % — ABNORMAL LOW (ref 39.0–52.0)
Hemoglobin: 13.3 g/dL (ref 13.0–17.0)
MCH: 30.9 pg (ref 26.0–34.0)
MCHC: 36.4 g/dL — ABNORMAL HIGH (ref 30.0–36.0)
MCV: 84.9 fL (ref 80.0–100.0)
Platelets: 221 10*3/uL (ref 150–400)
RBC: 4.3 MIL/uL (ref 4.22–5.81)
RDW: 12.8 % (ref 11.5–15.5)
WBC: 6 10*3/uL (ref 4.0–10.5)
nRBC: 0 % (ref 0.0–0.2)

## 2023-05-11 LAB — MAGNESIUM: Magnesium: 2.2 mg/dL (ref 1.7–2.4)

## 2023-05-11 LAB — PHOSPHORUS: Phosphorus: 3.4 mg/dL (ref 2.5–4.6)

## 2023-05-11 NOTE — Progress Notes (Signed)
This RN responded to bed alarm. Patient found walking into bathroom without assistance. Patient educated on purpose of calling staff before exiting bed. Patient refusing bed alarm at this time. Charge RN aware as this RN is charge at this time. Primary RN Earney Mallet notified.

## 2023-05-11 NOTE — Progress Notes (Signed)
[facility] Progress Note  Patient: [name] [name]    NWG:[id number]  DOA: 05/09/2023     Date of Service: the patient was seen and examined on 05/11/2023  Chief Complaint  Patient presents with   Fall   Brief hospital course: MARCIANO MEGA is a 77 y.o. male with medical history significant of CAD s/p CABG (2015), HFpEF, PAD, CVA, type 2 diabetes with peripheral neuropathy, hypertension, hyperlipidemia, chronic pain syndrome on Oxycodone, who presents to the ED due to ground-level fall.   Mr. Gaton states he was in his usual state of health when he got up in the middle of the night to use the restroom.  After getting up and taking a few steps, he noted that he fell to the ground landing onto his right side.  He does not believe he lost consciousness, and states if he did it must have been a very brief.  He denies any head trauma.  He was able to help himself up using a chair but noticed he was having significant right leg pain and hip pain.  Due to this, he was unable to bear weight.  Prior to or after the fall, he denies any dizziness, shortness of breath, chest pain or palpitations.  He denies any recent illnesses, fever, chills, nausea, vomiting, diarrhea, dysuria, urinary frequency.   ED course: On arrival to the ED, patient was normotensive at 128/69 with heart rate of 54.  He was saturating at 95% on room air.  He was afebrile at 97.7.  Initial workup notable for normal CBC, troponin x 2, CMP with the exception of a glucose of 143.  Imaging including femur x-ray, CT head, CT hip, and CT C-spine with no acute findings.  Urinalysis with glucosuria and rare bacteria but no WBC, leukocytes or nitrites.  Due to persistent bradycardia, [facility] contacted for admission.   Assessment and Plan:  # Ground-level fall, most likely due to symptomatic bradycardia Patient fell in the middle of the night while using restroom, no LOC, denied any symptoms. - Telemetry monitoring TTE LVEF 50 to  55%, grade 2 diastolic dysfunction, no any other significant findings. - PT/OT - Oxycodone and Tylenol as needed for hip pain; no evidence of fracture on imaging   # Sinus bradycardia Patient had sinus bradycardia, no prior history, not on BB/CCB, does not take any other AV nodal blocker.  Denies any chest pain or palpitations. EKG with some subtle changes, however EDP discussed with STEMI; does not meet STEMI criteria. Continue telemetry monitoring - Cardiology consulted; appreciate their recommendations - Atropine as needed if symptomatic or heart rate below 40 - TTE as above - TSH and magnesium wnl   # Hypotension 12/7 Started midodrine 10 mg p.o. 3 times daily with holding parameters Monitor BP and titrate medication accordingly   # Chronic pain syndrome - Continue home oxycodone with additional doses available given acute pain   # Type 2 diabetes mellitus with complication, without long-term current use of insulin (HCC) Uncontrolled diabetes with last A1c of 8.4% approximately 1 month prior.   - Hold home regimen - SSI, moderate   # Acquired polyneuropathy - Continue home regimen   # CAD in native artery History of CAD s/p CABG.  No reported chest pain at this time.  Troponins are negative x 2. - Continue home regimen  Body mass index is 26.79 kg/m.  Interventions:  Diet: Heart healthy/carb modified diet DVT Prophylaxis: Subcutaneous Lovenox   Advance goals of care discussion: Full  code  Family Communication: family was not present at bedside, at the time of interview.  The pt provided permission to discuss medical plan with the family. Opportunity was given to ask question and all questions were answered satisfactorily.   Disposition:  Pt is from Home, admitted with symptomatic bradycardia, still has bradycardia, which precludes a safe discharge. Discharge to home, when stable and cleared by cardiology.  Subjective: No significant events overnight, patient  denies any chest pain or palpitation, sitting comfortably on the bed.   Physical Exam: General: NAD, lying comfortably Appear in no distress, affect appropriate Eyes: PERRLA ENT: Oral Mucosa Clear, moist  Neck: no JVD,  Cardiovascular: Bradycardia, S1 and S2 Present, no Murmur,  Respiratory: good respiratory effort, Bilateral Air entry equal and Decreased, no Crackles, no wheezes Abdomen: Bowel Sound present, Soft and no tenderness,  Skin: no rashes Extremities: no Pedal edema, no calf tenderness Neurologic: without any new focal findings Gait not checked due to patient safety concerns  Vitals:   05/11/23 0400 05/11/23 0600 05/11/23 0755 05/11/23 1250  BP: (!) 106/56  (!) 98/43 105/66  Pulse: 69  62 66  Resp: 16  16 16   Temp: 97.9 F (36.6 C)  98 F (36.7 C) 98 F (36.7 C)  TempSrc: Oral  Oral Oral  SpO2: 99%  96% 95%  Weight:  82.3 kg    Height:        Intake/Output Summary (Last 24 hours) at 05/11/2023 1329 Last data filed at 05/10/2023 1400 Gross per 24 hour  Intake 240 ml  Output --  Net 240 ml   Filed Weights   05/09/23 0444 05/10/23 0500 05/11/23 0600  Weight: 89.8 kg 84 kg 82.3 kg    Data Reviewed: I have personally reviewed and interpreted daily labs, tele strips, imagings as discussed above. I reviewed all nursing notes, pharmacy notes, vitals, pertinent old records I have discussed plan of care as described above with RN and patient/family.  CBC: Recent Labs  Lab 05/09/23 0930 05/11/23 0641  WBC 6.4 6.0  NEUTROABS 2.7  --   HGB 14.3 13.3  HCT 40.0 36.5*  MCV 87.1 84.9  PLT 222 221   Basic Metabolic Panel: Recent Labs  Lab 05/09/23 0930 05/09/23 1127 05/10/23 0533 05/11/23 0641  NA 136  --  136 134*  K 4.0  --  4.0 3.8  CL 101  --  102 101  CO2 28  --  27 24  GLUCOSE 143*  --  131* 161*  BUN 14  --  15 15  CREATININE 0.76  --  0.79 0.78  CALCIUM 9.4  --  9.3 8.9  MG  --  2.1  --  2.2  PHOS  --   --   --  3.4    Studies: No  results found.  Scheduled Meds:  acetaminophen  650 mg Oral Q6H   dicyclomine  10 mg Oral BID   dipyridamole-aspirin  1 capsule Oral BID   enoxaparin (LOVENOX) injection  40 mg Subcutaneous Q24H   ezetimibe  10 mg Oral Daily   gabapentin  300 mg Oral q morning   And   gabapentin  600 mg Oral QPM   insulin aspart  0-15 Units Subcutaneous TID WC   midodrine  10 mg Oral TID WC   pantoprazole  40 mg Oral q morning   senna  1 tablet Oral QHS   sodium chloride flush  3 mL Intravenous Q12H   tamsulosin  0.4 mg Oral  QPC supper   Continuous Infusions: PRN Meds: cyclobenzaprine, Muscle Rub, ondansetron **OR** ondansetron (ZOFRAN) IV, oxyCODONE, polyethylene glycol  Time spent: 35 minutes  Author: Gillis Santa. MD [facility] 05/11/2023 1:29 PM  To reach On-call, see care teams to locate the attending and reach out to them via [email or web address]. If 7PM-7AM, please contact night-coverage If you still have difficulty reaching the attending provider, please page the [facility] (Director on Call) for [facility] on amion for assistance.

## 2023-05-11 NOTE — Plan of Care (Signed)

## 2023-05-11 NOTE — Progress Notes (Signed)
Patient is refusing bed alarm. When bed alarm went off this RN went into room and found patient turning off the bed alarm. Patient stated that he does not need the bed alarm on. This RN educated patient on importance of keeping the bed alarm on for safety.

## 2023-05-11 NOTE — Plan of Care (Signed)
  Problem: Education: Goal: Knowledge of General Education information will improve Description: Including pain rating scale, medication(s)/side effects and non-pharmacologic comfort measures Outcome: Progressing   Problem: Health Behavior/Discharge Planning: Goal: Ability to manage health-related needs will improve Outcome: Progressing   Problem: Clinical Measurements: Goal: Ability to maintain clinical measurements within normal limits will improve Outcome: Progressing Goal: Will remain free from infection Outcome: Progressing Goal: Diagnostic test results will improve Outcome: Progressing Goal: Respiratory complications will improve Outcome: Progressing Goal: Cardiovascular complication will be avoided Outcome: Progressing   Problem: Activity: Goal: Risk for activity intolerance will decrease Outcome: Progressing   Problem: Nutrition: Goal: Adequate nutrition will be maintained Outcome: Progressing   Problem: Coping: Goal: Level of anxiety will decrease Outcome: Progressing   Problem: Elimination: Goal: Will not experience complications related to bowel motility Outcome: Progressing Goal: Will not experience complications related to urinary retention Outcome: Progressing   Problem: Pain Management: Goal: General experience of comfort will improve Outcome: Progressing   Problem: Skin Integrity: Goal: Risk for impaired skin integrity will decrease Outcome: Progressing   Problem: Safety: Goal: Ability to remain free from injury will improve Outcome: Progressing   Problem: Education: Goal: Ability to describe self-care measures that may prevent or decrease complications (Diabetes Survival Skills Education) will improve Outcome: Progressing Goal: Individualized Educational Video(s) Outcome: Progressing   Problem: Coping: Goal: Ability to adjust to condition or change in health will improve Outcome: Progressing   Problem: Health Behavior/Discharge  Planning: Goal: Ability to identify and utilize available resources and services will improve Outcome: Progressing Goal: Ability to manage health-related needs will improve Outcome: Progressing   Problem: Fluid Volume: Goal: Ability to maintain a balanced intake and output will improve Outcome: Progressing   Problem: Metabolic: Goal: Ability to maintain appropriate glucose levels will improve Outcome: Progressing   Problem: Nutritional: Goal: Maintenance of adequate nutrition will improve Outcome: Progressing Goal: Progress toward achieving an optimal weight will improve Outcome: Progressing   Problem: Skin Integrity: Goal: Risk for impaired skin integrity will decrease Outcome: Progressing   Problem: Tissue Perfusion: Goal: Adequacy of tissue perfusion will improve Outcome: Progressing

## 2023-05-11 NOTE — Progress Notes (Addendum)
Transition of Care [facility]) - Inpatient Brief Assessment   Patient Details  Name: [name] [name] MRN: [id number] Date of Birth: May 09, 1946  [facility] CM/SW Contact:    [name] Quarry, RN Phone Number: 05/11/2023, 10:40 AM   Clinical Narrative:12/8: ON 12/6 from home to ED with ground level fall. Significant PMH for CAD s/p CABG (2015), HFpEF, PAD, CVA, type 2 diabetes with peripheral neuropathy, hypertension, hyperlipidemia, chronic pain syndrome on Oxycodone.  Admitted with dx of symptomatic bradycardia and fall, still has bradycardia, which precludes a safe discharge.Lives with spouse, MOD I with ADL's, spouse reports some cognitive issues.   New general TOC consult for HH/SNF/DME needs/ PT/OT recs in late yesterday for [facility] post discharge. Left VM for spouse given some memory/cognitive impairment noted on patient intake PT/OT notes.   1100 am UPDATE: Spouse returned call from home and indicated patient had been active in the past with both [place] and [facility], most recently with [facility] and was agreeable to [facility] post discharge. She wishes to be the contact person as she says patient is a "difficult patient" and won't want any help.   On the recommended DME she wants to wait as patient may have cardiac procedure for bradycardia and may not need it. Patient has a rollator at home but PT said not to use  that while a fall risk and recommends FWRW.   Explained there may be a charge depending on when rollator was covered by insurance--another reason she wants to wait to see on his medical conditions.   [name] [name], [name] [name] (Spouse) [phone] (Mobile)   [name] Cirri MSN RN CM  Care Management Department.  [place]  [facility] Direct Dial: [phone] Main Office Phone: [phone] Weekends Only      Transition of Care Asessment: Insurance and Status: Insurance coverage has been reviewed Patient has primary care physician: Yes Home environment has been reviewed: From  home with spouse. Prior level of function:: Mod I with quad cane. Some cognitive impairment but IADL's and shopping laundry and emals.   Social Determinants of Health Reivew: SDOH reviewed no interventions necessary Readmission risk has been reviewed: Yes (No calculated yet.) Transition of care needs: transition of care needs identified, TOC will continue to follow

## 2023-05-12 ENCOUNTER — Other Ambulatory Visit: Payer: Self-pay | Admitting: Student

## 2023-05-12 DIAGNOSIS — E118 Type 2 diabetes mellitus with unspecified complications: Secondary | ICD-10-CM | POA: Diagnosis not present

## 2023-05-12 DIAGNOSIS — R54 Age-related physical debility: Secondary | ICD-10-CM

## 2023-05-12 DIAGNOSIS — G894 Chronic pain syndrome: Secondary | ICD-10-CM | POA: Diagnosis not present

## 2023-05-12 DIAGNOSIS — R001 Bradycardia, unspecified: Secondary | ICD-10-CM | POA: Diagnosis not present

## 2023-05-12 DIAGNOSIS — W1830XA Fall on same level, unspecified, initial encounter: Secondary | ICD-10-CM | POA: Diagnosis not present

## 2023-05-12 LAB — CBC
HCT: 38.1 % — ABNORMAL LOW (ref 39.0–52.0)
Hemoglobin: 13.9 g/dL (ref 13.0–17.0)
MCH: 31.1 pg (ref 26.0–34.0)
MCHC: 36.5 g/dL — ABNORMAL HIGH (ref 30.0–36.0)
MCV: 85.2 fL (ref 80.0–100.0)
Platelets: 224 10*3/uL (ref 150–400)
RBC: 4.47 MIL/uL (ref 4.22–5.81)
RDW: 12.7 % (ref 11.5–15.5)
WBC: 6 10*3/uL (ref 4.0–10.5)
nRBC: 0 % (ref 0.0–0.2)

## 2023-05-12 LAB — BASIC METABOLIC PANEL
Anion gap: 9 (ref 5–15)
BUN: 14 mg/dL (ref 8–23)
CO2: 21 mmol/L — ABNORMAL LOW (ref 22–32)
Calcium: 9.2 mg/dL (ref 8.9–10.3)
Chloride: 106 mmol/L (ref 98–111)
Creatinine, Ser: 0.67 mg/dL (ref 0.61–1.24)
GFR, Estimated: >60 mL/min (ref >60)
Glucose, Bld: 127 mg/dL — ABNORMAL HIGH (ref 70–99)
Potassium: 3.7 mmol/L (ref 3.5–5.1)
Sodium: 136 mmol/L (ref 135–145)

## 2023-05-12 LAB — PHOSPHORUS: Phosphorus: 3.4 mg/dL (ref 2.5–4.6)

## 2023-05-12 LAB — GLUCOSE, CAPILLARY
Glucose-Capillary: 138 mg/dL — ABNORMAL HIGH (ref 70–99)
Glucose-Capillary: 141 mg/dL — ABNORMAL HIGH (ref 70–99)

## 2023-05-12 LAB — MAGNESIUM: Magnesium: 2.1 mg/dL (ref 1.7–2.4)

## 2023-05-12 MED ORDER — MIDODRINE HCL 10 MG PO TABS: 10.0000 mg | ORAL_TABLET | Freq: Three times a day (TID) | ORAL | 2 refills | Status: DC

## 2023-05-12 NOTE — Progress Notes (Signed)
Physical Therapy Treatment Patient Details Name: [name] [name] MRN: [id number] DOB: 1945/12/31 Today's Date: 05/12/2023   History of Present Illness 77 y.o. male with medical history significant of CAD s/p CABG (2015), HFpEF, PAD, CVA, type 2 diabetes with peripheral neuropathy, hypertension, hyperlipidemia, chronic pain syndrome on Oxycodone, who presents to the ED due to ground-level fall.    PT Comments  Patient is agreeable to PT session. He was seated in the chair on arrival to room and reports he is hopeful to return home today. The patient ambulated with and without rolling walker. Gait pattern and posture is improved using rolling walker which is recommended for safety with longer distance ambulation. He walked the lap around nursing station with no shortness of breath, no dizziness reported. Heart rate 57-72bpm with standing activity. Recommend to continue PT to maximize independence and facilitate return to prior level of function.    If plan is discharge home, recommend the following: A little help with walking and/or transfers;A little help with bathing/dressing/bathroom;Assistance with cooking/housework;Assist for transportation;Help with stairs or ramp for entrance   Can travel by private vehicle        Equipment Recommendations  Rolling walker (2 wheels)    Recommendations for Other Services       Precautions / Restrictions Precautions Precautions: Fall Restrictions Weight Bearing Restrictions: No     Mobility  Bed Mobility               General bed mobility comments: not assessed    Transfers Overall transfer level: Needs assistance Equipment used: None Transfers: Sit to/from Stand Sit to Stand: Supervision           General transfer comment: increased time with heavy use of arm rests for standing    Ambulation/Gait Ambulation/Gait assistance: Supervision Gait Distance (Feet): 175 Feet Assistive device: Rolling walker (2 wheels), None Gait  Pattern/deviations: Decreased stride length, Trunk flexed Gait velocity: decreased     General Gait Details: patient ambulated  short distance without device with increased flexed posture, decreased step length, and reaching out for furniture furniture for support. with rolling walker, posture and gait kinematics improved. encourged rolling walker for longer distance ambulation for safety. no dizziness reported with walking   Stairs             Wheelchair Mobility     Tilt Bed    Modified Rankin (Stroke Patients Only)       Balance Overall balance assessment: Mild deficits observed, not formally tested                                          Cognition Arousal: Alert Behavior During Therapy: WFL for tasks assessed/performed Overall Cognitive Status: History of cognitive impairments - at baseline                                 General Comments: patient is able to follow all commands without difficulty. he has decreased safety awareness (has refused bed alarms) and is moving around in the room without staff assistance        Exercises      General Comments General comments (skin integrity, edema, etc.): heart rate 57-72bpm with hallway ambulation with no dizziness or adverse symptoms noted. educated patient on energy conservation strategies      Pertinent Vitals/Pain Pain Assessment  Pain Assessment: No/denies pain    Home Living                          Prior Function            PT Goals (current goals can now be found in the care plan section) Acute Rehab PT Goals Patient Stated Goal: go home PT Goal Formulation: With patient/family Time For Goal Achievement: 05/23/23 Potential to Achieve Goals: Good Progress towards PT goals: Progressing toward goals    Frequency    Min 1X/week      PT Plan      Co-evaluation              AM-PAC PT "6 Clicks" Mobility   Outcome Measure  Help needed turning  from your back to your side while in a flat bed without using bedrails?: None Help needed moving from lying on your back to sitting on the side of a flat bed without using bedrails?: None Help needed moving to and from a bed to a chair (including a wheelchair)?: None Help needed standing up from a chair using your arms (e.g., wheelchair or bedside chair)?: A Little Help needed to walk in hospital room?: A Little Help needed climbing 3-5 steps with a railing? : A Little 6 Click Score: 21    End of Session         PT Visit Diagnosis: Difficulty in walking, not elsewhere classified (R26.2);Unsteadiness on feet (R26.81);Muscle weakness (generalized) (M62.81)     Time: 4098-1191 PT Time Calculation (min) (ACUTE ONLY): 12 min  Charges:    $Therapeutic Activity: 8-22 mins PT General Charges $$ ACUTE PT VISIT: 1 Visit                     Donna Bernard, PT, MPT    Ina Homes 05/12/2023, 10:38 AM

## 2023-05-12 NOTE — Progress Notes (Signed)
Heart Failure Navigator Progress Note  Assessed for Heart & Vascular TOC clinic readiness.  Patient does not meet criteria due to [facility] Patient.   Navigator will sign off at this time.  Roxy Horseman, RN, BSN [facility] Heart Failure Navigator Secure Chat Only

## 2023-05-12 NOTE — Progress Notes (Signed)
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 shortness of breath, with negative troponin x 2 (9, 10), unlikely to represent inferior STEMI 2.  CABG x 1, currently without chest pain 3.  Fall, patient fell ambulating to the bathroom, without presyncope or syncope 4.  Intermittent sinus bradycardia, appears clinically and hemodynamically stable  Recommendations  1.  Bradycardia on tele, patient without symptoms of dizziness/lightheadedness, presyncope/syncope 2.  Will place 14 day monitor for additional evaluation on discharge 3.  Avoid AV nodal blockers 4.  Plan for follow up in 2-3 weeks with Dr. Penne Lash for discharge today from a cardiac perspective. Will arrange for follow up in clinic with Dr. [name] in 2-3 weeks.    This patient's plan of care was discussed and created with Dr. Juliann Pares and he is in agreement.    Signed: Gale Journey, PA-C  05/12/2023, 10:49 AM [facility]

## 2023-05-12 NOTE — Progress Notes (Signed)
Patient with no IV present
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
## 2023-05-12 NOTE — Progress Notes (Signed)
Patient alert and oriented, vss.  D/c telemetry.  Escorted out of hospital via wheelchair by volunteers  [facility] had placed holter monitor on patient prior to discharge -- to be worn 14 days and return to [facility].

## 2023-05-12 NOTE — TOC Transition Note (Signed)
Transition of Care [facility]) - CM/SW Discharge Note   Patient Details  Name: [name] MRN: [id number] Date of Birth: April 05, 1946  Transition of Care [facility]) CM/SW Contact:  Truddie Hidden, RN Phone Number: 05/12/2023, 3:08 PM   Clinical Narrative:    Attempt to reach the patient and his wife several times regarding discharge plan for [facility] PT/ OT. Patient's wife requested [facility] previously with weekend TOC.   Spoke with [employer] from [place]. Referral accepted.   HH orders requested from MD.   TOC signing off.          Patient Goals and CMS Choice      Discharge Placement                         Discharge Plan and Services Additional resources added to the After Visit Summary for                                       Social Determinants of Health (SDOH) Interventions SDOH Screenings   Food Insecurity: No Food Insecurity (05/09/2023)  Housing: Low Risk  (05/09/2023)  Transportation Needs: No Transportation Needs (05/09/2023)  Utilities: Not At Risk (05/09/2023)  Financial Resource Strain: Low Risk  (12/11/2022)   Received from [facility]  Tobacco Use: High Risk (05/09/2023)     Readmission Risk Interventions     No data to display

## 2023-07-21 ENCOUNTER — Encounter
Admission: RE | Admit: 2023-07-21 | Discharge: 2023-07-21 | Disposition: A | Discharge status: Home / Self Care | Payer: Medicare HMO | Source: Ambulatory Visit | Attending: Surgery | Admitting: Surgery

## 2023-07-21 ENCOUNTER — Other Ambulatory Visit: Payer: Self-pay

## 2023-07-21 ENCOUNTER — Other Ambulatory Visit: Payer: Self-pay | Admitting: Surgery

## 2023-07-21 DIAGNOSIS — Z01812 Encounter for preprocedural laboratory examination: Secondary | ICD-10-CM

## 2023-07-21 DIAGNOSIS — M1612 Unilateral primary osteoarthritis, left hip: Secondary | ICD-10-CM

## 2023-07-21 HISTORY — DX: Unilateral primary osteoarthritis, left hip: M16.12

## 2023-07-21 NOTE — Patient Instructions (Addendum)
 Your procedure is scheduled on: 07/24/2023 Thursday Report to the Registration Desk on the 1st floor of the [facility]. To find out your arrival time, please call [phone] between 1PM - 3PM on: 07/23/2023 Wednesday  If your arrival time is 6:00 am, do not arrive before that time as the [facility] entrance doors do not open until 6:00 am.  REMEMBER: Instructions that are not followed completely may result in serious medical risk, up to and including death; or upon the discretion of your surgeon and anesthesiologist your surgery may need to be rescheduled.  Do not eat food after midnight the night before surgery.  No gum chewing or hard candies.  You may however, drink water  up to 2 hours before you are scheduled to arrive for your surgery. Do not drink anything within 2 hours of your scheduled arrival time.  In addition, your doctor has ordered for you to drink the provided:  Gatorade G2 Drinking this carbohydrate drink up to two hours before surgery helps to reduce insulin resistance and improve patient outcomes. Please complete drinking 2 hours before scheduled arrival time.  One week prior to surgery: Stop Anti-inflammatories (NSAIDS) such as Advil, Aleve, Ibuprofen, Motrin, Naproxen, Naprosyn and Aspirin based products such as Excedrin, Goody's Powder, BC Powder. Stop ANY OVER THE COUNTER supplements until after surgery.  You may however, continue to take Tylenol if needed for pain up until the day of surgery.  Do NOT TAKE  Semaglutide, 1 MG/DOSE, 4 MG/3ML seven 7 days prior to surgery. Latest dose would have been Wednesday, FEB 12.   Contact you Cardiologist  for the recommendation regarding aggrenox. Remember their instructions and the last dose taken.   Continue taking all of your other prescription medications up until the day of surgery.  ON THE DAY OF SURGERY ONLY TAKE THESE MEDICATIONS WITH SIPS OF WATER:  pantoprazole (PROTONIX)  gabapentin (NEURONTIN) 300 MG  capsule    No Alcohol for 24 hours before or after surgery.  No Smoking including e-cigarettes for 24 hours before surgery.  No chewable tobacco products for at least 6 hours before surgery.  No nicotine patches on the day of surgery.  Do not use any "recreational" drugs for at least a week (preferably 2 weeks) before your surgery.  Please be advised that the combination of cocaine and anesthesia may have negative outcomes, up to and including death. If you test positive for cocaine, your surgery will be cancelled.  On the morning of surgery brush your teeth with toothpaste and water, you may rinse your mouth with mouthwash if you wish. Do not swallow any toothpaste or mouthwash.  Use CHG Soap or wipes as directed on instruction sheet.-Provided for you  Do not wear jewelry, make-up, hairpins, clips or nail polish.  For welded (permanent) jewelry: bracelets, anklets, waist bands, etc.  Please have this removed prior to surgery.  If it is not removed, there is a chance that hospital personnel will need to cut it off on the day of surgery.  Do not wear lotions, powders, or perfumes.   Do not shave body hair from the neck down 48 hours before surgery.  Contact lenses, hearing aids and dentures may not be worn into surgery.  Do not bring valuables to the hospital. [facility] is not responsible for any missing/lost belongings or valuables.   Notify your doctor if there is any change in your medical condition (cold, fever, infection).  Wear comfortable clothing (specific to your surgery type) to  the hospital.  After surgery, you can help prevent lung complications by doing breathing exercises.  Take deep breaths and cough every 1-2 hours. Your doctor may order a device called an Incentive Spirometer to help you take deep breaths. If you are being admitted to the hospital overnight, leave your suitcase in the car. After surgery it may be brought to your room.    Please call the  Pre-admissions Testing Dept. at [phone] if you have any questions about these instructions.  Surgery Visitation Policy:  Patients having surgery or a procedure may have two visitors.  Children under the age of 52 must have an adult with them who is not the patient.  Temporary Visitor Restrictions Due to increasing cases of flu, RSV and COVID-19: Children ages 12 and under will not be able to visit patients in [facility] hospitals under most circumstances.  Inpatient Visitation:    Visiting hours are 7 a.m. to 8 p.m. Up to four visitors are allowed at one time in a patient room. The visitors may rotate out with other people during the day.  One visitor age 75 or older may stay with the patient overnight and must be in the room by 8 p.m.    Pre-operative 5 CHG Bath Instructions   You can play a key role in reducing the risk of infection after surgery. Your skin needs to be as free of germs as possible. You can reduce the number of germs on your skin by washing with CHG (chlorhexidine gluconate) soap before surgery. CHG is an antiseptic soap that kills germs and continues to kill germs even after washing.   DO NOT use if you have an allergy to chlorhexidine/CHG or antibacterial soaps. If your skin becomes reddened or irritated, stop using the CHG and notify one of our RNs at [phone].   Please shower with the CHG soap starting 4 days before surgery using the following schedule:        Please keep in mind the following:  DO NOT shave, including legs and underarms, starting the day of your first shower.   You may shave your face at any point before/day of surgery.  Place clean sheets on your bed the day you start using CHG soap. Use a clean washcloth (not used since being washed) for each shower. DO NOT sleep with pets once you start using the CHG.   CHG Shower Instructions:  If you choose to wash your hair and private area, wash first with your normal shampoo/soap.   After you use shampoo/soap, rinse your hair and body thoroughly to remove shampoo/soap residue.  Turn the water OFF and apply about 3 tablespoons (45 ml) of CHG soap to a CLEAN washcloth.  Apply CHG soap ONLY FROM YOUR NECK DOWN TO YOUR TOES (washing for 3-5 minutes)  DO NOT use CHG soap on face, private areas, open wounds, or sores.  Pay special attention to the area where your surgery is being performed.  If you are having back surgery, having someone wash your back for you may be helpful. Wait 2 minutes after CHG soap is applied, then you may rinse off the CHG soap.  Pat dry with a clean towel  Put on clean clothes/pajamas   If you choose to wear lotion, please use ONLY the CHG-compatible lotions on the back of this paper.     Additional instructions for the day of surgery: DO NOT APPLY any lotions, deodorants, cologne, or perfumes.   Put on clean/comfortable clothes.  Brush your teeth.  Ask your nurse before applying any prescription medications to the skin.      CHG Compatible Lotions   Aveeno Moisturizing lotion  Cetaphil Moisturizing Cream  Cetaphil Moisturizing Lotion  Clairol Herbal Essence Moisturizing Lotion, Dry Skin  Clairol Herbal Essence Moisturizing Lotion, Extra Dry Skin  Clairol Herbal Essence Moisturizing Lotion, Normal Skin  Curel Age Defying Therapeutic Moisturizing Lotion with Alpha Hydroxy  Curel Extreme Care Body Lotion  Curel Soothing Hands Moisturizing Hand Lotion  Curel Therapeutic Moisturizing Cream, Fragrance-Free  Curel Therapeutic Moisturizing Lotion, Fragrance-Free  Curel Therapeutic Moisturizing Lotion, Original Formula  Eucerin Daily Replenishing Lotion  Eucerin Dry Skin Therapy Plus Alpha Hydroxy Crme  Eucerin Dry Skin Therapy Plus Alpha Hydroxy Lotion  Eucerin Original Crme  Eucerin Original Lotion  Eucerin Plus Crme Eucerin Plus Lotion  Eucerin TriLipid Replenishing Lotion  Keri Anti-Bacterial Hand Lotion  Keri Deep Conditioning  Original Lotion Dry Skin Formula Softly Scented  Keri Deep Conditioning Original Lotion, Fragrance Free Sensitive Skin Formula  Keri Lotion Fast Absorbing Fragrance Free Sensitive Skin Formula  Keri Lotion Fast Absorbing Softly Scented Dry Skin Formula  Keri Original Lotion  Keri Skin Renewal Lotion Keri Silky Smooth Lotion  Keri Silky Smooth Sensitive Skin Lotion  Nivea Body Creamy Conditioning Oil  Nivea Body Extra Enriched Lotion  Nivea Body Original Lotion  Nivea Body Sheer Moisturizing Lotion Nivea Crme  Nivea Skin Firming Lotion  NutraDerm 30 Skin Lotion  NutraDerm Skin Lotion  NutraDerm Therapeutic Skin Cream  NutraDerm Therapeutic Skin Lotion  ProShield Protective Hand Cream  Provon moisturizing lotion     How to Use an Incentive Spirometer  An incentive spirometer is a tool that measures how well you are filling your lungs with each breath. Learning to take long, deep breaths using this tool can help you keep your lungs clear and active. This may help to reverse or lessen your chance of developing breathing (pulmonary) problems, especially infection. You may be asked to use a spirometer: After a surgery. If you have a lung problem or a history of smoking. After a long period of time when you have been unable to move or be active. If the spirometer includes an indicator to show the highest number that you have reached, your health care provider or respiratory therapist will help you set a goal. Keep a log of your progress as told by your health care provider. What are the risks? Breathing too quickly may cause dizziness or cause you to pass out. Take your time so you do not get dizzy or light-headed. If you are in pain, you may need to take pain medicine before doing incentive spirometry. It is harder to take a deep breath if you are having pain. How to use your incentive spirometer  Sit up on the edge of your bed or on a chair. Hold the incentive spirometer so that it is  in an upright position. Before you use the spirometer, breathe out normally. Place the mouthpiece in your mouth. Make sure your lips are closed tightly around it. Breathe in slowly and as deeply as you can through your mouth, causing the piston or the ball to rise toward the top of the chamber. Hold your breath for 3-5 seconds, or for as long as possible. If the spirometer includes a coach indicator, use this to guide you in breathing. Slow down your breathing if the indicator goes above the marked areas. Remove the mouthpiece from your mouth and breathe out normally. The  piston or ball will return to the bottom of the chamber. Rest for a few seconds, then repeat the steps 10 or more times. Take your time and take a few normal breaths between deep breaths so that you do not get dizzy or light-headed. Do this every 1-2 hours when you are awake. If the spirometer includes a goal marker to show the highest number you have reached (best effort), use this as a goal to work toward during each repetition. After each set of 10 deep breaths, cough a few times. This will help to make sure that your lungs are clear. If you have an incision on your chest or abdomen from surgery, place a pillow or a rolled-up towel firmly against the incision when you cough. This can help to reduce pain while taking deep breaths and coughing. General tips When you are able to get out of bed: Walk around often. Continue to take deep breaths and cough in order to clear your lungs. Keep using the incentive spirometer until your health care provider says it is okay to stop using it. If you have been in the hospital, you may be told to keep using the spirometer at home. Contact a health care provider if: You are having difficulty using the spirometer. You have trouble using the spirometer as often as instructed. Your pain medicine is not giving enough relief for you to use the spirometer as told. You have a fever. Get help  right away if: You develop shortness of breath. You develop a cough with bloody mucus from the lungs. You have fluid or blood coming from an incision site after you cough. Summary An incentive spirometer is a tool that can help you learn to take long, deep breaths to keep your lungs clear and active. You may be asked to use a spirometer after a surgery, if you have a lung problem or a history of smoking, or if you have been inactive for a long period of time. Use your incentive spirometer as instructed every 1-2 hours while you are awake. If you have an incision on your chest or abdomen, place a pillow or a rolled-up towel firmly against your incision when you cough. This will help to reduce pain. Get help right away if you have shortness of breath, you cough up bloody mucus, or blood comes from your incision when you cough. This information is not intended to replace advice given to you by your health care provider. Make sure you discuss any questions you have with your health care provider. Document Revised: 08/09/2019 Document Reviewed: 08/09/2019 Elsevier Patient Education  2023 [employer]     Preoperative Educational Videos for Total Hip, Knee and Shoulder Replacements  To better prepare for surgery, please view our videos that explain the physical activity and discharge planning required to have the best surgical recovery at [facility].  [email or web address]  Questions? Call [phone] or email [email or web address]

## 2023-07-23 ENCOUNTER — Encounter: Payer: Self-pay | Admitting: Surgery

## 2023-07-23 ENCOUNTER — Encounter
Admission: RE | Admit: 2023-07-23 | Discharge: 2023-07-23 | Discharge status: Home / Self Care | Payer: Medicare HMO | Source: Ambulatory Visit | Attending: Surgery

## 2023-07-23 ENCOUNTER — Encounter: Payer: Self-pay | Admitting: Urgent Care

## 2023-07-23 DIAGNOSIS — Z01812 Encounter for preprocedural laboratory examination: Secondary | ICD-10-CM | POA: Insufficient documentation

## 2023-07-23 DIAGNOSIS — M1612 Unilateral primary osteoarthritis, left hip: Secondary | ICD-10-CM | POA: Diagnosis not present

## 2023-07-23 LAB — TYPE AND SCREEN
ABO/RH(D): O POS
Antibody Screen: NEGATIVE

## 2023-07-23 LAB — SURGICAL PCR SCREEN
MRSA, PCR: NEGATIVE
Staphylococcus aureus: NEGATIVE
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
## 2023-07-23 NOTE — Progress Notes (Signed)
  Perioperative Services Pre-Admission/Anesthesia Testing    Date: 07/23/23  Name: [name] [name] MRN:   [id number]  Re: GLP-1 clearance and provider recommendations   Planned Surgical Procedure(s):    Case: [id number] Date/Time: 07/24/23 0715   Procedure: TOTAL HIP ARTHROPLASTY (Left: Hip)   Anesthesia type: Choice   Pre-op diagnosis: PRIMARY OSTEOARTHRITIS OF LEFT HIP.   Location: [facility] OR ROOM 02 / [facility]   Surgeons: Christena Flake, MD      Clinical Notes:  Patient is scheduled for the above procedure with the indicated provider/surgeon. In review of his medication reconciliation it was noted that patient is on a prescribed GLP-1 medication. Per guidelines issued by the American Society of Anesthesiologists (ASA), it is recommended that these medications be held for 7 days prior to the patient undergoing any type of elective surgical procedure. The patient is taking the following GLP-1 medication:  [x]  SEMAGLUTIDE   []  EXENATIDE  []  LIRAGLUTIDE   []  LIXISENATIDE  []  DULAGLUTIDE     []  TIRZEPATIDE (GLP-1/GIP)  Reached out to prescribing provider Marcello Fennel, MD) to make them aware of the guidelines from anesthesia. Given that this patient takes the prescribed GLP-1 medication for his  diabetes diagnosis, rather than for weight loss, recommendations from the prescribing provider were solicited. Prescribing provider made aware of the following so that informed decision/POC can be developed for this patient that may be taking medications belonging to these drug classes:  Oral GLP-1 medications will be held 1 day prior to surgery.  Injectable GLP-1 medications will be held 7 days prior to surgery.  Metformin is routinely held 48 hours prior to surgery due to renal concerns, potential need for contrasted imaging perioperatively, and the potential for tissue hypoxia leading to drug induced lactic acidosis.  All SGLT2i medications are held 72 hours prior to surgery  as they can be associated with the increased potential for developing euglycemic diabetic ketoacidosis (EDKA).   Impression and Plan:  [name] is on a prescribed GLP-1 medication, which induces the known side effect of decreased gastric emptying. Efforts are bring made to mitigate the risk of perioperative hyperglycemic events, as elevated blood glucose levels have been found to contribute to intra/postoperative complications. Additionally, hyperglycemic extremes can potentially necessitate the postponing of a patient's elective case in order to better optimize perioperative glycemic control, again with the aforementioned guidelines in place. With this in mind, recommendations have been sought from the prescribing provider, who has cleared patient to proceed with holding the prescribed GLP-1 as per the guidelines from the ASA.   Provider recommending: no further recommendations received from the prescribing provider.  Copy of signed clearance and recommendations placed on patient's chart for inclusion in their medical record and for review by the surgical/anesthetic team on the day of his procedure.   Quentin Mulling, MSN, APRN, FNP-C, CEN [facility]  Perioperative Services Nurse Practitioner Phone: [phone] Fax: [phone] 07/23/23 7:42 AM  NOTE: This note has been prepared using Dragon dictation software. Despite my best ability to proofread, there is always the potential that unintentional transcriptional errors may still occur from this process.

## 2023-07-24 ENCOUNTER — Ambulatory Visit: Payer: Medicare HMO

## 2023-07-24 ENCOUNTER — Ambulatory Visit: Payer: Self-pay | Admitting: Urgent Care

## 2023-07-24 ENCOUNTER — Other Ambulatory Visit: Payer: Self-pay

## 2023-07-24 ENCOUNTER — Telehealth (HOSPITAL_COMMUNITY): Payer: Self-pay | Admitting: Pharmacy Technician

## 2023-07-24 ENCOUNTER — Ambulatory Visit
Admission: RE | Admit: 2023-07-24 | Discharge: 2023-07-25 | Disposition: A | Discharge status: Home / Self Care | Payer: Medicare HMO | Source: Ambulatory Visit | Attending: Surgery | Admitting: Surgery

## 2023-07-24 ENCOUNTER — Encounter: Payer: Self-pay | Admitting: Surgery

## 2023-07-24 ENCOUNTER — Other Ambulatory Visit (HOSPITAL_COMMUNITY): Payer: Self-pay

## 2023-07-24 ENCOUNTER — Encounter
Admission: RE | Disposition: A | Discharge status: Home / Self Care | Payer: Self-pay | Source: Ambulatory Visit | Attending: Surgery

## 2023-07-24 DIAGNOSIS — K219 Gastro-esophageal reflux disease without esophagitis: Secondary | ICD-10-CM | POA: Insufficient documentation

## 2023-07-24 DIAGNOSIS — I5032 Chronic diastolic (congestive) heart failure: Secondary | ICD-10-CM | POA: Insufficient documentation

## 2023-07-24 DIAGNOSIS — Z79899 Other long term (current) drug therapy: Secondary | ICD-10-CM | POA: Insufficient documentation

## 2023-07-24 DIAGNOSIS — Z7902 Long term (current) use of antithrombotics/antiplatelets: Secondary | ICD-10-CM | POA: Insufficient documentation

## 2023-07-24 DIAGNOSIS — I69354 Hemiplegia and hemiparesis following cerebral infarction affecting left non-dominant side: Secondary | ICD-10-CM | POA: Diagnosis not present

## 2023-07-24 DIAGNOSIS — E1140 Type 2 diabetes mellitus with diabetic neuropathy, unspecified: Secondary | ICD-10-CM | POA: Insufficient documentation

## 2023-07-24 DIAGNOSIS — Z7984 Long term (current) use of oral hypoglycemic drugs: Secondary | ICD-10-CM | POA: Diagnosis not present

## 2023-07-24 DIAGNOSIS — E1151 Type 2 diabetes mellitus with diabetic peripheral angiopathy without gangrene: Secondary | ICD-10-CM | POA: Diagnosis not present

## 2023-07-24 DIAGNOSIS — I110 Hypertensive heart disease with heart failure: Secondary | ICD-10-CM | POA: Insufficient documentation

## 2023-07-24 DIAGNOSIS — I2510 Atherosclerotic heart disease of native coronary artery without angina pectoris: Secondary | ICD-10-CM | POA: Insufficient documentation

## 2023-07-24 DIAGNOSIS — Z96642 Presence of left artificial hip joint: Secondary | ICD-10-CM

## 2023-07-24 DIAGNOSIS — Z7982 Long term (current) use of aspirin: Secondary | ICD-10-CM | POA: Insufficient documentation

## 2023-07-24 DIAGNOSIS — I252 Old myocardial infarction: Secondary | ICD-10-CM | POA: Insufficient documentation

## 2023-07-24 DIAGNOSIS — Z01812 Encounter for preprocedural laboratory examination: Secondary | ICD-10-CM

## 2023-07-24 DIAGNOSIS — Z7901 Long term (current) use of anticoagulants: Secondary | ICD-10-CM | POA: Insufficient documentation

## 2023-07-24 DIAGNOSIS — G894 Chronic pain syndrome: Secondary | ICD-10-CM | POA: Diagnosis not present

## 2023-07-24 DIAGNOSIS — M1612 Unilateral primary osteoarthritis, left hip: Secondary | ICD-10-CM | POA: Insufficient documentation

## 2023-07-24 DIAGNOSIS — Z951 Presence of aortocoronary bypass graft: Secondary | ICD-10-CM | POA: Insufficient documentation

## 2023-07-24 DIAGNOSIS — E118 Type 2 diabetes mellitus with unspecified complications: Secondary | ICD-10-CM

## 2023-07-24 DIAGNOSIS — Z7985 Long-term (current) use of injectable non-insulin antidiabetic drugs: Secondary | ICD-10-CM | POA: Insufficient documentation

## 2023-07-24 DIAGNOSIS — F17210 Nicotine dependence, cigarettes, uncomplicated: Secondary | ICD-10-CM | POA: Diagnosis not present

## 2023-07-24 DIAGNOSIS — K449 Diaphragmatic hernia without obstruction or gangrene: Secondary | ICD-10-CM | POA: Insufficient documentation

## 2023-07-24 HISTORY — DX: Other cerebrovascular disease: I67.89

## 2023-07-24 HISTORY — PX: TOTAL HIP ARTHROPLASTY: SHX124

## 2023-07-24 HISTORY — DX: Unspecified diastolic (congestive) heart failure: I50.30

## 2023-07-24 HISTORY — DX: Other nonspecific abnormal finding of lung field: R91.8

## 2023-07-24 HISTORY — DX: Other specified disease of esophagus: K22.89

## 2023-07-24 HISTORY — DX: Other congenital malformations of spine, not associated with scoliosis: Q76.49

## 2023-07-24 HISTORY — DX: Cervicalgia: M54.2

## 2023-07-24 HISTORY — DX: Unspecified hearing loss, unspecified ear: H91.90

## 2023-07-24 HISTORY — DX: Residual foreign body in soft tissue: M79.5

## 2023-07-24 HISTORY — DX: Atherosclerosis of aorta: I70.0

## 2023-07-24 HISTORY — DX: Ischemic cardiomyopathy: I25.5

## 2023-07-24 LAB — GLUCOSE, CAPILLARY
Glucose-Capillary: 110 mg/dL — ABNORMAL HIGH (ref 70–99)
Glucose-Capillary: 153 mg/dL — ABNORMAL HIGH (ref 70–99)
Glucose-Capillary: 200 mg/dL — ABNORMAL HIGH (ref 70–99)
Glucose-Capillary: 245 mg/dL — ABNORMAL HIGH (ref 70–99)

## 2023-07-24 LAB — ABO/RH: ABO/RH(D): O POS

## 2023-07-24 SURGERY — ARTHROPLASTY, HIP, TOTAL,POSTERIOR APPROACH
Anesthesia: General | Site: Hip | Laterality: Left

## 2023-07-24 MED ORDER — SODIUM CHLORIDE 0.9 % IV SOLN: INTRAVENOUS | Status: AC

## 2023-07-24 MED ORDER — ONDANSETRON HCL 4 MG/2ML IJ SOLN: 4.0000 mg | Freq: Four times a day (QID) | INTRAMUSCULAR | Status: DC | PRN

## 2023-07-24 MED ORDER — CEFAZOLIN SODIUM-DEXTROSE 2-4 GM/100ML-% IV SOLN
INTRAVENOUS | Status: AC
  Filled 2023-07-24: qty 100

## 2023-07-24 MED ORDER — CHLORHEXIDINE GLUCONATE 0.12 % MT SOLN
OROMUCOSAL | Status: AC
  Filled 2023-07-24: qty 15

## 2023-07-24 MED ORDER — OXYCODONE HCL 5 MG PO TABS: 5.0000 mg | ORAL_TABLET | Freq: Once | ORAL | Status: DC | PRN

## 2023-07-24 MED ORDER — PROPOFOL 1000 MG/100ML IV EMUL
INTRAVENOUS | Status: AC
  Filled 2023-07-24: qty 100

## 2023-07-24 MED ORDER — TAMSULOSIN HCL 0.4 MG PO CAPS
0.4000 mg | ORAL_CAPSULE | Freq: Every day | ORAL | Status: DC
  Administered 2023-07-24 – 2023-07-25 (×2): 0.4 mg via ORAL
  Filled 2023-07-24 (×2): qty 1

## 2023-07-24 MED ORDER — ONDANSETRON HCL 4 MG PO TABS
4.0000 mg | ORAL_TABLET | Freq: Four times a day (QID) | ORAL | Status: DC | PRN
Start: 2023-07-24 — End: 2023-07-25

## 2023-07-24 MED ORDER — ONDANSETRON HCL 4 MG/2ML IJ SOLN
INTRAMUSCULAR | Status: DC | PRN
  Administered 2023-07-24: 4 mg via INTRAVENOUS

## 2023-07-24 MED ORDER — HYDROMORPHONE HCL 1 MG/ML IJ SOLN: 0.5000 mg | INTRAMUSCULAR | Status: DC | PRN

## 2023-07-24 MED ORDER — OXYCODONE HCL 5 MG/5ML PO SOLN: 5.0000 mg | Freq: Once | ORAL | Status: DC | PRN

## 2023-07-24 MED ORDER — STERILE WATER FOR IRRIGATION IR SOLN
Status: DC | PRN
Start: 2023-07-24 — End: 2023-07-24
  Administered 2023-07-24: 1000 mL

## 2023-07-24 MED ORDER — GABAPENTIN 300 MG PO CAPS
300.0000 mg | ORAL_CAPSULE | Freq: Every day | ORAL | Status: DC
  Administered 2023-07-25: 300 mg via ORAL
  Filled 2023-07-24: qty 1

## 2023-07-24 MED ORDER — OXYCODONE HCL 5 MG PO TABS
5.0000 mg | ORAL_TABLET | ORAL | Status: DC | PRN
  Administered 2023-07-24: 10 mg via ORAL
  Filled 2023-07-24: qty 2

## 2023-07-24 MED ORDER — GABAPENTIN 300 MG PO CAPS
600.0000 mg | ORAL_CAPSULE | Freq: Every day | ORAL | Status: DC
  Administered 2023-07-24: 600 mg via ORAL
  Filled 2023-07-24: qty 2

## 2023-07-24 MED ORDER — TRIAMCINOLONE ACETONIDE 40 MG/ML IJ SUSP
INTRAMUSCULAR | Status: DC | PRN
  Administered 2023-07-24: 93 mL

## 2023-07-24 MED ORDER — 0.9 % SODIUM CHLORIDE (POUR BTL) OPTIME
TOPICAL | Status: DC | PRN
  Administered 2023-07-24: 500 mL

## 2023-07-24 MED ORDER — PROPOFOL 10 MG/ML IV BOLUS
INTRAVENOUS | Status: DC | PRN
  Administered 2023-07-24: 80 mg via INTRAVENOUS

## 2023-07-24 MED ORDER — METOCLOPRAMIDE HCL 5 MG PO TABS: 5.0000 mg | ORAL_TABLET | Freq: Three times a day (TID) | ORAL | Status: DC | PRN

## 2023-07-24 MED ORDER — PHENYLEPHRINE 80 MCG/ML (10ML) SYRINGE FOR IV PUSH (FOR BLOOD PRESSURE SUPPORT)
PREFILLED_SYRINGE | INTRAVENOUS | Status: DC | PRN
  Administered 2023-07-24 (×5): 80 ug via INTRAVENOUS

## 2023-07-24 MED ORDER — CHLORHEXIDINE GLUCONATE 0.12 % MT SOLN
15.0000 mL | Freq: Once | OROMUCOSAL | Status: AC
  Administered 2023-07-24: 15 mL via OROMUCOSAL

## 2023-07-24 MED ORDER — KETOROLAC TROMETHAMINE 15 MG/ML IJ SOLN
INTRAMUSCULAR | Status: AC
  Filled 2023-07-24: qty 1

## 2023-07-24 MED ORDER — FENTANYL CITRATE (PF) 100 MCG/2ML IJ SOLN
INTRAMUSCULAR | Status: DC | PRN
  Administered 2023-07-24 (×2): 25 ug via INTRAVENOUS
  Administered 2023-07-24: 50 ug via INTRAVENOUS

## 2023-07-24 MED ORDER — FLEET ENEMA RE ENEM: 1.0000 | ENEMA | Freq: Once | RECTAL | Status: DC | PRN

## 2023-07-24 MED ORDER — DEXAMETHASONE SODIUM PHOSPHATE 10 MG/ML IJ SOLN
INTRAMUSCULAR | Status: DC | PRN
  Administered 2023-07-24: 4 mg via INTRAVENOUS

## 2023-07-24 MED ORDER — PHENYLEPHRINE HCL-NACL 20-0.9 MG/250ML-% IV SOLN
INTRAVENOUS | Status: AC
  Filled 2023-07-24: qty 250

## 2023-07-24 MED ORDER — SUGAMMADEX SODIUM 200 MG/2ML IV SOLN
INTRAVENOUS | Status: DC | PRN
  Administered 2023-07-24: 200 mg via INTRAVENOUS

## 2023-07-24 MED ORDER — ROCURONIUM BROMIDE 100 MG/10ML IV SOLN
INTRAVENOUS | Status: DC | PRN
  Administered 2023-07-24: 10 mg via INTRAVENOUS
  Administered 2023-07-24: 50 mg via INTRAVENOUS
  Administered 2023-07-24: 20 mg via INTRAVENOUS

## 2023-07-24 MED ORDER — PROPOFOL 10 MG/ML IV BOLUS
INTRAVENOUS | Status: AC
  Filled 2023-07-24: qty 40

## 2023-07-24 MED ORDER — SEMAGLUTIDE (1 MG/DOSE) 4 MG/3ML ~~LOC~~ SOPN: 1.0000 mg | PEN_INJECTOR | SUBCUTANEOUS | Status: DC

## 2023-07-24 MED ORDER — HYDROMORPHONE HCL 1 MG/ML IJ SOLN
INTRAMUSCULAR | Status: AC
  Filled 2023-07-24: qty 1

## 2023-07-24 MED ORDER — ORAL CARE MOUTH RINSE: 15.0000 mL | Freq: Once | OROMUCOSAL | Status: AC

## 2023-07-24 MED ORDER — DICYCLOMINE HCL 10 MG PO CAPS
10.0000 mg | ORAL_CAPSULE | Freq: Two times a day (BID) | ORAL | Status: DC
  Administered 2023-07-24 – 2023-07-25 (×2): 10 mg via ORAL
  Filled 2023-07-24 (×2): qty 1

## 2023-07-24 MED ORDER — LIDOCAINE HCL (CARDIAC) PF 100 MG/5ML IV SOSY
PREFILLED_SYRINGE | INTRAVENOUS | Status: DC | PRN
  Administered 2023-07-24: 80 mg via INTRAVENOUS

## 2023-07-24 MED ORDER — DIPHENHYDRAMINE HCL 12.5 MG/5ML PO ELIX: 12.5000 mg | ORAL_SOLUTION | ORAL | Status: DC | PRN

## 2023-07-24 MED ORDER — KETOROLAC TROMETHAMINE 15 MG/ML IJ SOLN
15.0000 mg | Freq: Once | INTRAMUSCULAR | Status: AC
  Administered 2023-07-24: 15 mg via INTRAVENOUS

## 2023-07-24 MED ORDER — ACETAMINOPHEN 325 MG PO TABS: 325.0000 mg | ORAL_TABLET | Freq: Four times a day (QID) | ORAL | Status: DC | PRN

## 2023-07-24 MED ORDER — APIXABAN 2.5 MG PO TABS
2.5000 mg | ORAL_TABLET | Freq: Two times a day (BID) | ORAL | Status: DC
  Administered 2023-07-25: 2.5 mg via ORAL
  Filled 2023-07-24: qty 1

## 2023-07-24 MED ORDER — ASPIRIN-DIPYRIDAMOLE ER 25-200 MG PO CP12
1.0000 | ORAL_CAPSULE | Freq: Two times a day (BID) | ORAL | Status: DC
  Administered 2023-07-24 – 2023-07-25 (×2): 1 via ORAL
  Filled 2023-07-24 (×3): qty 1

## 2023-07-24 MED ORDER — DOCUSATE SODIUM 100 MG PO CAPS
100.0000 mg | ORAL_CAPSULE | Freq: Two times a day (BID) | ORAL | Status: DC
  Administered 2023-07-24 – 2023-07-25 (×3): 100 mg via ORAL
  Filled 2023-07-24 (×3): qty 1

## 2023-07-24 MED ORDER — METOCLOPRAMIDE HCL 5 MG/ML IJ SOLN: 5.0000 mg | Freq: Three times a day (TID) | INTRAMUSCULAR | Status: DC | PRN

## 2023-07-24 MED ORDER — MAGNESIUM HYDROXIDE 400 MG/5ML PO SUSP: 30.0000 mL | Freq: Every day | ORAL | Status: DC | PRN

## 2023-07-24 MED ORDER — BUPIVACAINE LIPOSOME 1.3 % IJ SUSP
INTRAMUSCULAR | Status: AC
Start: 2023-07-24 — End: ?
  Filled 2023-07-24: qty 20

## 2023-07-24 MED ORDER — HYDROMORPHONE HCL 1 MG/ML IJ SOLN
INTRAMUSCULAR | Status: DC | PRN
  Administered 2023-07-24: .4 mg via INTRAVENOUS

## 2023-07-24 MED ORDER — ACETAMINOPHEN 10 MG/ML IV SOLN
INTRAVENOUS | Status: AC
  Filled 2023-07-24: qty 100

## 2023-07-24 MED ORDER — TRIAMCINOLONE ACETONIDE 40 MG/ML IJ SUSP
INTRAMUSCULAR | Status: AC
  Filled 2023-07-24: qty 2

## 2023-07-24 MED ORDER — FENTANYL CITRATE (PF) 100 MCG/2ML IJ SOLN
INTRAMUSCULAR | Status: AC
Start: 2023-07-24 — End: ?
  Filled 2023-07-24: qty 2

## 2023-07-24 MED ORDER — KETOROLAC TROMETHAMINE 15 MG/ML IJ SOLN
7.5000 mg | Freq: Four times a day (QID) | INTRAMUSCULAR | Status: AC
  Administered 2023-07-24 – 2023-07-25 (×4): 7.5 mg via INTRAVENOUS
  Filled 2023-07-24 (×4): qty 1

## 2023-07-24 MED ORDER — INSULIN ASPART 100 UNIT/ML IJ SOLN
0.0000 [IU] | Freq: Three times a day (TID) | INTRAMUSCULAR | Status: DC
  Administered 2023-07-24: 3 [IU] via SUBCUTANEOUS
  Administered 2023-07-25: 5 [IU] via SUBCUTANEOUS
  Administered 2023-07-25: 3 [IU] via SUBCUTANEOUS
  Filled 2023-07-24 (×3): qty 1

## 2023-07-24 MED ORDER — ACETAMINOPHEN 10 MG/ML IV SOLN
INTRAVENOUS | Status: DC | PRN
  Administered 2023-07-24: 1000 mg via INTRAVENOUS

## 2023-07-24 MED ORDER — BUPIVACAINE-EPINEPHRINE (PF) 0.5% -1:200000 IJ SOLN
INTRAMUSCULAR | Status: AC
  Filled 2023-07-24: qty 30

## 2023-07-24 MED ORDER — SODIUM CHLORIDE 0.9 % IR SOLN
Status: DC | PRN
  Administered 2023-07-24: 3000 mL

## 2023-07-24 MED ORDER — SODIUM CHLORIDE 0.9 % IV SOLN: INTRAVENOUS | Status: DC

## 2023-07-24 MED ORDER — ACETAMINOPHEN 500 MG PO TABS
1000.0000 mg | ORAL_TABLET | Freq: Four times a day (QID) | ORAL | Status: AC
  Administered 2023-07-24 – 2023-07-25 (×4): 1000 mg via ORAL
  Filled 2023-07-24 (×4): qty 2

## 2023-07-24 MED ORDER — TRANEXAMIC ACID-NACL 1000-0.7 MG/100ML-% IV SOLN
INTRAVENOUS | Status: AC
  Filled 2023-07-24: qty 100

## 2023-07-24 MED ORDER — SODIUM CHLORIDE (PF) 0.9 % IJ SOLN
INTRAMUSCULAR | Status: AC
  Filled 2023-07-24: qty 40

## 2023-07-24 MED ORDER — CEFAZOLIN SODIUM-DEXTROSE 2-4 GM/100ML-% IV SOLN
2.0000 g | Freq: Four times a day (QID) | INTRAVENOUS | Status: AC
  Administered 2023-07-24 (×2): 2 g via INTRAVENOUS
  Filled 2023-07-24 (×2): qty 100

## 2023-07-24 MED ORDER — CEFAZOLIN SODIUM-DEXTROSE 2-4 GM/100ML-% IV SOLN
2.0000 g | INTRAVENOUS | Status: AC
  Administered 2023-07-24: 2 g via INTRAVENOUS

## 2023-07-24 MED ORDER — CYCLOBENZAPRINE HCL 10 MG PO TABS: 5.0000 mg | ORAL_TABLET | Freq: Three times a day (TID) | ORAL | Status: DC | PRN

## 2023-07-24 MED ORDER — GLIMEPIRIDE 4 MG PO TABS
4.0000 mg | ORAL_TABLET | Freq: Every day | ORAL | Status: DC
  Administered 2023-07-24: 4 mg via ORAL
  Filled 2023-07-24 (×2): qty 1

## 2023-07-24 MED ORDER — TRANEXAMIC ACID-NACL 1000-0.7 MG/100ML-% IV SOLN
1000.0000 mg | INTRAVENOUS | Status: AC
  Administered 2023-07-24: 1000 mg via INTRAVENOUS

## 2023-07-24 MED ORDER — PANTOPRAZOLE SODIUM 40 MG PO TBEC
40.0000 mg | DELAYED_RELEASE_TABLET | Freq: Every morning | ORAL | Status: DC
  Administered 2023-07-25: 40 mg via ORAL
  Filled 2023-07-24: qty 1

## 2023-07-24 MED ORDER — BISACODYL 10 MG RE SUPP: 10.0000 mg | Freq: Every day | RECTAL | Status: DC | PRN

## 2023-07-24 MED ORDER — FENTANYL CITRATE (PF) 100 MCG/2ML IJ SOLN: 25.0000 ug | INTRAMUSCULAR | Status: DC | PRN

## 2023-07-24 SURGICAL SUPPLY — 55 items
ACE SHELL 54 4H HIP (Shell) ×1 IMPLANT
ACETAB SHELL 4H 54 F HIP (Shell) ×1 IMPLANT
BIT DRILL JC 5IN 2.4M 127 24FL (BIT) IMPLANT
BLADE SAGITTAL WIDE XTHICK NO (BLADE) ×1 IMPLANT
BLADE SURG SZ20 CARB STEEL (BLADE) ×1 IMPLANT
CHLORAPREP W/TINT 26 (MISCELLANEOUS) ×1 IMPLANT
DRAPE IMP U-DRAPE 54X76 (DRAPES) IMPLANT
DRAPE INCISE IOBAN 66X60 STRL (DRAPES) ×1 IMPLANT
DRAPE SHEET LG 3/4 BI-LAMINATE (DRAPES) ×1 IMPLANT
DRAPE SURG 17X11 SM STRL (DRAPES) ×2 IMPLANT
DRSG MEPILEX SACRM 8.7X9.8 (GAUZE/BANDAGES/DRESSINGS) ×1 IMPLANT
DRSG OPSITE POSTOP 4X10 (GAUZE/BANDAGES/DRESSINGS) ×1 IMPLANT
ELECT CAUTERY BLADE 6.4 (BLADE) ×1 IMPLANT
GAUZE 4X4 16PLY ~~LOC~~+RFID DBL (SPONGE) ×1 IMPLANT
GAUZE XEROFORM 1X8 LF (GAUZE/BANDAGES/DRESSINGS) ×1 IMPLANT
GLOVE BIO SURGEON STRL SZ7.5 (GLOVE) ×4 IMPLANT
GLOVE BIO SURGEON STRL SZ8 (GLOVE) ×4 IMPLANT
GLOVE BIOGEL PI IND STRL 8 (GLOVE) ×1 IMPLANT
GLOVE INDICATOR 8.0 STRL GRN (GLOVE) ×1 IMPLANT
GOWN STRL REUS W/ TWL LRG LVL3 (GOWN DISPOSABLE) ×1 IMPLANT
GOWN STRL REUS W/ TWL XL LVL3 (GOWN DISPOSABLE) ×1 IMPLANT
HANDLE YANKAUER SUCT OPEN TIP (MISCELLANEOUS) ×1 IMPLANT
HEAD CERAMIC BIOLOX 36 T1 STD (Head) IMPLANT
HOLSTER ELECTROSUGICAL PENCIL (MISCELLANEOUS) ×1 IMPLANT
HOOD PEEL AWAY T7 (MISCELLANEOUS) ×3 IMPLANT
IV NS IRRIG 3000ML ARTHROMATIC (IV SOLUTION) ×1 IMPLANT
KIT TURNOVER KIT A (KITS) ×1 IMPLANT
LINER ACE G7 36 SZF HIGH WALL (Liner) IMPLANT
MANIFOLD NEPTUNE II (INSTRUMENTS) ×1 IMPLANT
NDL FILTER BLUNT 18X1 1/2 (NEEDLE) ×1 IMPLANT
NDL SAFETY ECLIPSE 18X1.5 (NEEDLE) ×2 IMPLANT
NDL SPNL 20GX3.5 QUINCKE YW (NEEDLE) ×1 IMPLANT
NEEDLE FILTER BLUNT 18X1 1/2 (NEEDLE) ×1 IMPLANT
NEEDLE SPNL 20GX3.5 QUINCKE YW (NEEDLE) ×1 IMPLANT
PACK HIP PROSTHESIS (MISCELLANEOUS) ×1 IMPLANT
PENCIL SMOKE EVACUATOR (MISCELLANEOUS) ×1 IMPLANT
PIN STEIN SMOOTH 3/16X9 (Pin) ×1 IMPLANT
PIN STEIN SMOOTH 4.8X9 (Pin) ×1 IMPLANT
PLUG APICAL HOLE HIP G7 (Hips) IMPLANT
PULSAVAC PLUS IRRIG FAN TIP (DISPOSABLE) ×1 IMPLANT
SHELL ACETAB 54 4H HIP (Shell) IMPLANT
SPONGE T-LAP 18X18 ~~LOC~~+RFID (SPONGE) ×5 IMPLANT
STAPLER SKIN PROX 35W (STAPLE) ×1 IMPLANT
STEM COLLARLESS FULL 14X150MM (Stem) IMPLANT
SUT TICRON 2-0 30IN 311381 (SUTURE) ×3 IMPLANT
SUT VIC AB 0 CT1 36 (SUTURE) ×1 IMPLANT
SUT VIC AB 1 CT1 36 (SUTURE) ×1 IMPLANT
SUT VIC AB 2-0 CT1 (SUTURE) ×3 IMPLANT
SYR 10ML LL (SYRINGE) ×1 IMPLANT
SYR 20ML LL LF (SYRINGE) ×1 IMPLANT
SYR 30ML LL (SYRINGE) ×2 IMPLANT
TIP FAN IRRIG PULSAVAC PLUS (DISPOSABLE) ×1 IMPLANT
TRAP FLUID SMOKE EVACUATOR (MISCELLANEOUS) ×2 IMPLANT
WATER STERILE IRR 1000ML POUR (IV SOLUTION) ×1 IMPLANT
WATER STERILE IRR 500ML POUR (IV SOLUTION) ×1 IMPLANT

## 2023-07-24 NOTE — Progress Notes (Signed)
 PT Cancellation Note  Patient Details Name: [name] [name] MRN: [id number] DOB: 1946-02-04   Cancelled Treatment:    Reason Eval/Treat Not Completed: Other (comment). Evaluation attempted, however pt very lethargic. Unable to awaken enough despite change in position, verbal, and tactile cues. Does open eyes briefly- unable to state name and then goes back to sleep. Not appropriate for full evaluation this date. Will re-attempt next date.   Alexander Mcauley 07/24/2023, 3:35 PM Elizabeth Palau, PT, DPT, GCS [phone]

## 2023-07-24 NOTE — Discharge Instructions (Signed)
 [facility].  [address] , [place], [place], [address]. [phone] They will call you to arrange when they can come to see you

## 2023-07-24 NOTE — Anesthesia Postprocedure Evaluation (Signed)
 Anesthesia Post Note  Patient: ZYIRE EIDSON  Procedure(s) Performed: TOTAL HIP ARTHROPLASTY (Left: Hip)  Patient location during evaluation: PACU Anesthesia Type: General Level of consciousness: awake and alert Pain management: pain level controlled Vital Signs Assessment: post-procedure vital signs reviewed and stable Respiratory status: spontaneous breathing, nonlabored ventilation, respiratory function stable and patient connected to nasal cannula oxygen Cardiovascular status: blood pressure returned to baseline and stable Postop Assessment: no apparent nausea or vomiting Anesthetic complications: no  No notable events documented.   Last Vitals:  Vitals:   07/24/23 1115 07/24/23 1133  BP: 99/65 101/64  Pulse: 76 72  Resp: 11 12  Temp:  36.6 C  SpO2: 95% 96%    Last Pain:  Vitals:   07/24/23 1133  TempSrc: Oral  PainSc:                  Stephanie Coup

## 2023-07-24 NOTE — Anesthesia Procedure Notes (Signed)
 Procedure Name: Intubation Date/Time: 07/24/2023 7:40 AM  Performed by: Henrietta Hoover, CRNAPre-anesthesia Checklist: Patient identified, Emergency Drugs available, Suction available and Patient being monitored Patient Re-evaluated:Patient Re-evaluated prior to induction Oxygen Delivery Method: Circle system utilized Preoxygenation: Pre-oxygenation with 100% oxygen Induction Type: IV induction Ventilation: Oral airway inserted - appropriate to patient size Laryngoscope Size: 4 and McGrath Grade View: Grade I Tube type: Oral Tube size: 7.0 mm Number of attempts: 1 Airway Equipment and Method: Stylet and Video-laryngoscopy Placement Confirmation: ETT inserted through vocal cords under direct vision, positive ETCO2 and breath sounds checked- equal and bilateral Secured at: 21 cm Tube secured with: Tape Dental Injury: Teeth and Oropharynx as per pre-operative assessment

## 2023-07-24 NOTE — Anesthesia Preprocedure Evaluation (Addendum)
 Anesthesia Evaluation  Patient identified by MRN, date of birth, ID band Patient awake    Reviewed: Allergy & Precautions, NPO status , Patient's Chart, lab work & pertinent test results  History of Anesthesia Complications Negative for: history of anesthetic complications  Airway Mallampati: III  TM Distance: <3 FB Neck ROM: full    Dental  (+) Poor Dentition, Missing, Dental Advidsory Given   Pulmonary neg shortness of breath, Current Smoker and Patient abstained from smoking., former smoker   Pulmonary exam normal        Cardiovascular hypertension, (-) angina + CAD, + Past MI, + CABG, + Peripheral Vascular Disease and +CHF  Normal cardiovascular exam(-) dysrhythmias      Neuro/Psych TIA Neuromuscular disease CVA  negative psych ROS   GI/Hepatic Neg liver ROS, hiatal hernia,GERD  Controlled and Medicated,,  Endo/Other  diabetes, Type 2    Renal/GU      Musculoskeletal   Abdominal   Peds  Hematology negative hematology ROS (+)   Anesthesia Other Findings Patient states he does not want a spinal for this case. Echo from 05/10/23:  FINDINGS   Left Ventricle: Left ventricular ejection fraction, by estimation, is 50  to 55%. The left ventricle has low normal function. The left ventricle has  no regional wall motion abnormalities. Definity contrast agent was given  IV to delineate the left  ventricular endocardial borders. The left ventricular internal cavity size  was normal in size. There is no left ventricular hypertrophy. Left  ventricular diastolic parameters are consistent with Grade II diastolic  dysfunction (pseudonormalization).   Right Ventricle: The right ventricular size is normal. No increase in  right ventricular wall thickness. Right ventricular systolic function is  normal.   Left Atrium: Left atrial size was normal in size.   Right Atrium: Right atrial size was normal in size.   Pericardium:  There is no evidence of pericardial effusion.   Mitral Valve: The mitral valve is normal in structure. Trivial mitral  valve regurgitation. No evidence of mitral valve stenosis.   Tricuspid Valve: The tricuspid valve is normal in structure. Tricuspid  valve regurgitation is trivial. No evidence of tricuspid stenosis.   Aortic Valve: The aortic valve is normal in structure. Aortic valve  regurgitation is not visualized. No aortic stenosis is present. Aortic  valve mean gradient measures 2.0 mmHg. Aortic valve peak gradient measures  3.9 mmHg. Aortic valve area, by VTI  measures 3.02 cm.   Pulmonic Valve: The pulmonic valve was normal in structure. Pulmonic valve  regurgitation is trivial. No evidence of pulmonic stenosis.   Aorta: The aortic root is normal in size and structure.   Venous: The inferior vena cava is normal in size with greater than 50%  respiratory variability, suggesting right atrial pressure of 3 mmHg.   IAS/Shunts: No atrial level shunt detected by color flow Doppler.     Past Medical History: No date: Anginal pain (HCC) No date: Arthritis No date: Carpal tunnel syndrome of right wrist No date: CHF (congestive heart failure) (HCC)     Comment:  a.)  TTE 05/08/2016: EF 55%; trivial MR, mild TR/PR;               G1DD.  b.)  TTE 11/10/2017: EF 55 to 60%; trivial MR;               apical HK; G1DD. c.)  TTE 12/13/2018: EF 50-55%;  periapical HK; mild LA enlargement. d.)  TTE 05/11/2020               EF 50 to 60%; mild MR. e.)  TTE 08/03/2021: EF >55%; triv              MR/TR, mild PR; G1DD 08/09/2009: Coronary artery disease     Comment:  a.) NSTEMI 08/09/2009 --> LHC: 30% pLAD, 100% mLAD, 30%               mLCx, 70% OM1, 20% pRCA, 20% mRCA, 30% dRCA, 100% RPDA;               no intervention. b.) LHC 08/11/2013: 100% mLAD, 90% pLCx,              75% OM1, 100% RPDA; refer to CVTS. c.) 5v CABG 08/20/2013 No date: DDD (degenerative disc disease),  lumbar No date: Diverticulosis No date: Full dentures No date: GERD (gastroesophageal reflux disease) No date: Hiatal hernia No date: HLD (hyperlipidemia) No date: HTN (hypertension) 05/10/2020: Lacunar infarct, acute (HCC) No date: Long term current use of antithrombotics/antiplatelets     Comment:  a.) dipyridamole-aspirin No date: Lumbar radiculopathy 11/10/2017: Mural thrombus of cardiac apex     Comment:  a.) TTE 11/10/2017: small LV apex thrombus measuring 10               x 10 mm No date: Neuropathy 08/09/2009: NSTEMI (non-ST elevated myocardial infarction) [facility])     Comment:  a.) LHC 08/09/2009: 30% pLAD, 100% mLAD, 30% mLCx, 70%               OM1, 20% pRCA, 20% mRCA, 30% dRCA, 100% RPDA;               intervention deferred opting for aggressive medical               management. 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 Rosita Kea office 10/20/2014: CARPAL TUNNEL RELEASE; Left     Comment:  Procedure: CARPAL TUNNEL RELEASE;  Surgeon: Kennedy Bucker, MD;  Location: [facility];  Service: Orthopedics;                Laterality: Left; No date: CATARACT EXTRACTION W/ INTRAOCULAR LENS IMPLANT; Bilateral 08/20/2013: CORONARY ARTERY BYPASS GRAFT; N/A     Comment:  Procedure: 5v CORONARY ARTERY BYPASS GRAFT; Location:               [facility]; Surgeon: Wilburt Finlay, MD 10/26/2015: ESOPHAGOGASTRODUODENOSCOPY (EGD) WITH PROPOFOL; N/A     Comment:   Procedure: ESOPHAGOGASTRODUODENOSCOPY (EGD) WITH               PROPOFOL with dialation;  Surgeon: [name], MD;  Location: [facility];  Service: Endoscopy;                Laterality: N/A; 08/09/2009: LEFT HEART CATH AND CORONARY ANGIOGRAPHY; Left     Comment:  Procedure: LEFT HEART CATH AND CORONARY ANGIOGRAPHY;               Location: [facility]; Surgeon: Arnoldo Hooker, MD 08/11/2013: LEFT HEART CATH AND CORONARY ANGIOGRAPHY; Left     Comment:  Procedure: LEFT HEART CATH AND CORONARY ANGIOGRAPHY;               Location: [facility]; Surgeon: Harold Hedge, MD 05/06/2018: LUMBAR LAMINECTOMY/DECOMPRESSION MICRODISCECTOMY;  Bilateral     Comment:  Procedure: Bilateral Lumbar Four-Five               Laminectomy/Foraminotomy;  Surgeon: Barnett Abu, MD;                Location: [facility];  Service: Neurosurgery;  Laterality:               Bilateral;  Bilateral Lumbar 4-5 Laminectomy/Foraminotomy No date: NECK SURGERY; N/A     Comment:  neck x 2, one discectomy and one shaved disc. 06/06/2015: SHOULDER ARTHROSCOPY; Right     Comment:  Procedure: right shoulder arthroscopy, arthroscopic               debridement, subacromial decompression, SLAP repair,               biotenodesis, mini open rotator cuff repair;  Surgeon:               Christena Flake, MD;  Location: [facility];  Service:               Orthopedics;  Laterality: Right; No date: SHOULDER OPEN ROTATOR CUFF REPAIR; Left  BMI    Body Mass Index: 29.95 kg/m      Reproductive/Obstetrics negative OB ROS                             Anesthesia Physical Anesthesia Plan  ASA: 3  Anesthesia Plan: General ETT   Post-op Pain Management:    Induction: Intravenous  PONV Risk Score and Plan: 2 and Ondansetron and Dexamethasone  Airway Management Planned: Oral ETT  Additional Equipment:   Intra-op Plan:   Post-operative Plan: Extubation in OR  Informed Consent: I have reviewed the patients  History and Physical, chart, labs and discussed the procedure including the risks, benefits and alternatives for the proposed anesthesia with the patient or authorized representative who has indicated his/her understanding and acceptance.     Dental Advisory Given  Plan Discussed with: Anesthesiologist, CRNA and Surgeon  Anesthesia Plan Comments: (Patient consented for risks of anesthesia including but not limited to:  - adverse reactions to medications - damage to eyes, teeth, lips or other oral mucosa - nerve damage due to positioning  - sore throat or hoarseness - Damage to heart, brain, nerves, lungs, other parts of body or loss of life  Patient voiced understanding and assent.)       Anesthesia Quick Evaluation

## 2023-07-24 NOTE — H&P (Signed)
 History of Present Illness: [name] [name] is a 78 y.o. male who presents today for his surgical history and physical for upcoming left total hip arthroplasty. Surgery scheduled with Dr. Joice Lofts on 07/24/2023. The patient was initially scheduled for the surgery in the past however A1c was 8.4, his most recent A1c was 7.6. The patient does to report moderate discomfort in his left hip. He does take chronic oxycodone and has been taking Tylenol as well. He reports a 6 out of 10 pain score at today's visit. He denies any recent trauma or injury affecting his left lower extremity since his last evaluation. The patient denies any personal history of asthma or COPD. He does have a history of a CVA in the past and is status post bypass surgery. He denies any history of DVT. Denies any recent fevers or chills.  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 No repeat per [facility])  CORONARY ARTERY BYPASS GRAFT 08/19/2013 (5 artery)  CORONARY ARTERY BYPASS W/SINGLE ARTERY GRAFT N/A 08/20/2013 (Procedure: CORONARY ARTERY BYPASS W/SINGLE ARTERY GRAFT, Left internal mammary artery harvest; Surgeon: Beverely Pace., MD;  Location: [facility]; Service: Cardiothoracic) CORONARY ARTERY BYPASS W/VENOUS & ARTERIAL GRAFTS N/A 08/20/2013 (Procedure: CORONARY ARTERY BYPASS W/VENOUS & ARTERIAL GRAFTS; Surgeon: Beverely Pace., MD; Location: [facility]; Service: Cardiothoracic) TRANSESOPHAGEAL ECHOCARDIOGRAPHY N/A 08/20/2013  Procedure: TRANSESOPHAGEAL ECHOCARDIOGRAPHY; Surgeon: Beverely Pace., MD; Location: [facility]; Service: Cardiothoracic; Laterality: N/A;  ENDOSCOPIC CARPAL TUNNEL RELEASE Left 10/20/2014  Impingement/tendinopathy with full-thickness rotator cuff tear, biceps tendinopathy, and SLAP tear, right shoulder Right 06/06/2015 (Dr. Joice Lofts)  back surgery 05/06/2018 (L4 & L5)  Left Long and Ring Trigger Finger Release Left 05/29/2018 (Dr. Rosita Kea)  KNEE ARTHROSCOPY Right 08/07/2021 (Dr. Rosita Kea)  Excision of epidermal cyst 01/01/2023 (Dr. Tonna Boehringer)  ANTERIOR FUSION CERVICAL SPINE  CATARACT EXTRACTION Bilateral  CHOLECYSTECTOMY  MICRODISCECTOMY LUMBAR x3  ORIF ANKLE FRACTURE Left  POSTERIOR LUMBAR SPINE FUSION ONE LEVEL   Past Family History: Liver disease Mother  Lung cancer Father  No Known Problems Sister  No Known Problems Sister  No Known Problems Sister  No Known Problems Daughter  No Known Problems Sister  No Known Problems Sister  Anesthesia problems Neg Hx  Malignant hyperthermia Neg Hx   Medications: aspirin-dipyridamole (AGGRENOX) 25-200 mg ER capsule Take 1 capsule by mouth 2 (two) times daily 60 capsule 5  blood glucose diagnostic test strip 1 each (1 strip total) once daily Use as instructed. 100 each 3  blood glucose meter kit as directed Use to monitor blood sugar once daily as directed. Please dispense preferred product based on patient's insurance. Test strips + Lancets 1 each 0  blood glucose meter kit as directed 1 each 0  clotrimazole-betamethasone (LOTRISONE) 1-0.05 % cream Apply topically 2 (two) times daily To left foot but not between toes 30  g  3  cyclobenzaprine (FLEXERIL) 10 MG tablet TAKE 1 TABLET BY MOUTH 3 TIMES DAILY AS NEEDED FOR MUSCLE SPASMS 90 tablet 3  dicyclomine (BENTYL) 10 mg capsule TAKE 1 CAPSULE BY MOUTH TWICE DAILY 60 capsule 1  docusate (COLACE) 100 MG capsule Take 100 mg by mouth at bedtime  gabapentin (NEURONTIN) 300 MG capsule TAKE 1 CAPSULE BY MOUTH 3 TIMES DAILY 270 capsule 1  glimepiride (AMARYL) 4 MG tablet Take 1 tablet (4 mg total) by mouth daily with breakfast 90 tablet 1  hyoscyamine (LEVBID) 0.375 mg ER tablet Take 1 tablet (0.375 mg total) by mouth every 12 (twelve) hours as needed for Cramping for up to 15 days 30 tablet 1  lancing device with lancets kit Use 1 each once daily Use as instructed. 100 each 3  midodrine (PROAMATINE) 10 MG tablet Take 10 mg by mouth 3 (three) times daily  oxyCODONE (OXYIR) 10 mg immediate release tablet TAKE 1 TABLET BY MOUTH TWICE DAILY AS NEEDED 60 tablet 0  pantoprazole (PROTONIX) 40 MG DR tablet Take 1 tablet (40 mg total) by mouth once daily 90 tablet 1  polyethylene glycol (MIRALAX) powder Take 17 g by mouth once daily Mix in 4-8ounces of fluid prior to taking. Take 2-3 times weekly  REPATHA SURECLICK 140 mg/mL PnIj INJECT 140MG  SUBCUTANEOUSLY EVERY 14 DAYS 2 mL 11  semaglutide (OZEMPIC) 1 mg/dose (4 mg/3 mL) pen injector Inject 0.75 mLs (1 mg total) subcutaneously once a week 3 mL 11  tamsulosin (FLOMAX) 0.4 mg capsule Take 0.4 mg by mouth once daily   Allergies: Statins-Hmg-Coa Reductase Inhibitors (Muscle Pain)  Amoxicillin-Pot Clavulanate Diarrhea (Gave him C. Diff)  Cucumber Fruit Extract Other (GI upset)  Parafon Forte [Chlorzoxazone] (Severe Itching)  Clopidogrel Bisulfate (Rash)   Review of Systems:  A comprehensive 14 point ROS was performed, reviewed by me today, and the pertinent orthopaedic findings are documented in the HPI.  Physical Exam: BP 130/70  Ht 175.3 cm (5\' 9" )  Wt 82.1 kg (181 lb)  BMI 26.73 kg/m  General/Constitutional: The  patient appears to be well-nourished, well-developed, and in no acute distress. Neuro/Psych: Normal mood and affect, oriented to person, place and time. Eyes: Non-icteric. Pupils are equal, round, and reactive to light, and exhibit synchronous movement. ENT: Unremarkable. Lymphatic: No palpable adenopathy. Respiratory: Lungs clear to auscultation, Normal chest excursion, No wheezes, and Non-labored breathing Cardiovascular: Regular rate and rhythm. No murmurs. and No edema, swelling or tenderness, except as noted in detailed exam. Integumentary: No impressive skin lesions present, except as noted in detailed exam. Musculoskeletal: Unremarkable, except as noted in detailed exam.  Hip exam: The patient presents today in a wheelchair. He has moderate pain with left hip flexion, internal rotation, and external rotation. He exhibits flexion to 105 degrees, internal rotation to 20 degrees, and external rotation to 50 degrees on the right and flexion to 100 degrees, internal rotation to 10 degrees, and external rotation to 45 degrees on the left. He is neurovascularly intact to both lower extremities. He has negative sitting straight leg raises bilaterally.  X-rays/MRI/Lab data:  Recent x-rays of the pelvis and left hip are available for review and have been reviewed by myself. These films demonstrate mild degenerative changes as manifest by a small femoral head and acetabular osteophytes with mild narrowing of the clear space. No fractures or lytic lesions are identified.  A recent CT scan of the left hip also is available for review and has been reviewed by myself. These films demonstrate evidence  of "mild left hip and sacroiliac degenerative changes without acute osseous findings."  Impression: 1. Primary osteoarthritis of left hip. 2. Type 2 diabetes mellitus with hyperglycemia.  Plan:  1. Treatment options were discussed today with the patient. 2. The patient is scheduled to undergo a left  total hip arthroplasty with Dr. Joice Lofts on 07/24/2023. 3. The patient was instructed on the risk and benefits of surgical intervention and wishes to proceed at this time. 4. This document will serve as a surgical history and physical for the patient. 5. The patient will follow-up per standard postop protocol. They can call the clinic they have any questions, new symptoms develop or symptoms worsen.  The procedure was discussed with the patient, as were the potential risks (including bleeding, infection, nerve and/or blood vessel injury, persistent or recurrent pain, failure of the hardware, leg length inequality, dislocation, need for further surgery, blood clots, strokes, heart attacks and/or arhythmias, pneumonia, etc.) and benefits. The patient states his understanding and wishes to proceed.    H&P reviewed and patient re-examined. No changes.

## 2023-07-24 NOTE — Transfer of Care (Signed)
 Immediate Anesthesia Transfer of Care Note  Patient: [name] [name]  Procedure(s) Performed: TOTAL HIP ARTHROPLASTY (Left: Hip)  Patient Location: PACU  Anesthesia Type:General  Level of Consciousness: awake, drowsy, and patient cooperative  Airway & Oxygen Therapy: Patient Spontanous Breathing and Patient connected to face mask oxygen  Post-op Assessment: Report given to RN, Post -op Vital signs reviewed and stable, and Patient moving all extremities  Post vital signs: Reviewed and stable  Last Vitals:  Vitals Value Taken Time  BP 104/56 07/24/23 0945  Temp 36.8 C 07/24/23 0935  Pulse 82 07/24/23 0945  Resp 15 07/24/23 0945  SpO2 98 % 07/24/23 0945  Vitals shown include unfiled device data.  Last Pain:  Vitals:   07/24/23 0656  TempSrc: Temporal  PainSc: 0-No pain         Complications: No notable events documented.

## 2023-07-24 NOTE — Telephone Encounter (Signed)
 Patient Product/process development scientist completed.    The patient is insured through [employer]. Patient has Medicare and is not eligible for a copay card, but may be able to apply for patient assistance or Medicare RX Payment Plan (Patient Must reach out to their plan, if eligible for payment plan), if available.    Ran test claim for Eliquis 2.5 mg and the current 30 day co-pay is $0.00.   This test claim was processed through [facility]- copay amounts may vary at other pharmacies due to pharmacy/plan contracts, or as the patient moves through the different stages of their insurance plan.     Roland Earl, CPHT Pharmacy Technician III Certified Patient Advocate [facility] Patient Advocate Team Direct Number: [phone]  Fax: [phone]

## 2023-07-24 NOTE — Op Note (Signed)
 07/24/2023  9:27 AM  Patient:   [name] [name]  Pre-Op Diagnosis:   Degenerative joint disease, left hip.  Post-Op Diagnosis:   Same.  Procedure:   Left total hip arthroplasty.  Surgeon:   Maryagnes Amos, MD  Assistant:   Horris Latino, PA-C  Anesthesia:   GET  Findings:   As above.  Complications:   None  EBL:   250 cc  Fluids:   500 cc crystalloid  UOP:   None  TT:   None  Drains:   None  Closure:   Staples  Implants:   Biomet press-fit system with a # #14 laterally offset Echo femoral stem, a 54 mm acetabular shell with an E-poly hi-wall liner, and a 36 mm ceramic head with a +0 mm neck.  Brief Clinical Note:   The patient is a 78 year old male with a history of progressively worsening left hip/groin pain. His symptoms have progressed despite medications, activity modification, a steroid injection, etc. His history and examination are consistent with degenerative joint disease confirmed by plain radiographs and subsequent CT scanning. He presents at this time for a left total hip arthroplasty.  Procedure:   The patient was brought into the operating room. After adequate general endotracheal intubation and anesthesia was obtained, the patient was repositioned in the right lateral decubitus position and secured using a lateral hip positioner. The left hip and lower extremity were prepped with ChloroPrep solution before being draped sterilely. Preoperative antibiotics were administered. A timeout was performed to verify the appropriate surgical site.    A standard posterior approach to the hip was made through an approximately 4-5 inch incision. The incision was carried down through the subcutaneous tissues to expose the gluteal fascia and proximal end of the iliotibial band. These structures were split the length of the incision and the Charnley self-retaining hip retractor placed. The bursal tissues were swept posteriorly to expose the short external rotators. The  anterior border of the piriformis tendon was identified and this plane developed down through the capsule to enter the joint. A flap of tissue was elevated off the posterior aspect of the femoral neck and greater trochanter and retracted posteriorly. This flap included the piriformis tendon, the short external rotators, and the posterior capsule. The soft tissues were elevated off the lateral aspect of the ilium and a large Steinmann pin placed bicortically.   With the left leg aligned over the right, a drill bit was placed into the greater trochanter parallel to the Steinmann pin and the distance between these two pins measured in order to optimize leg lengths postoperatively. The drill bit was removed and the hip dislocated. The piriformis fossa was debrided of soft tissues before the intramedullary canal was accessed through this point using a triple step reamer. The canal was reamed sequentially beginning with a #7 tapered reamer and progressing to a #14 tapered reamer. This provided excellent circumferential chatter. Using the appropriate guide, a femoral neck cut was made 10-12 mm above the lesser trochanter. The femoral head was removed.  Attention was directed to the acetabular side. The labrum was debrided circumferentially before the ligamentum teres was removed using a large curette. A line was drawn on the drapes corresponding to the native version of the acetabulum. This line was used as a guide while the acetabulum was reamed sequentially beginning with a 47 mm reamer and progressing to a 53 mm reamer. This provided excellent circumferential chatter. The 53 mm trial acetabulum was positioned and found to  fit quite well. Therefore, the 54 mm acetabular shell was selected and impacted into place with care taken to maintain the appropriate version. The trial high wall liner was inserted.  Attention was redirected to the femoral side. A box osteotome was used to establish version before the canal was  broached sequentially beginning with a #7 broach and progressing to a #14 broach. This was left in place and several trial reductions performed using both the standard and laterally offset neck options, as well as the -6 mm and +0 mm neck lengths. After removing the trial components, the "manhole cover" was placed into the apex of the acetabular shell and tightened securely. The permanent E-polyethylene hi-wall liner was impacted into the acetabular shell and its locking mechanism verified using a quarter-inch osteotome. Next, the # 14 laterally offset femoral stem was impacted into place with care taken to maintain the appropriate version. A repeat trial reduction was performed using the +0 mm neck length. The +0 mm neck length demonstrated excellent stability both in extension and external rotation as well as with flexion to 90 and internal rotation beyond 70. It also was stable in the position of sleep. In addition, leg lengths appeared to be restored appropriately, both by reassessing the position of the right leg over the left, as well as by measuring the distance between the Steinmann pin and the drill bit. The 36 mm ceramic head with the +0 mm neck was impacted onto the stem of the femoral component. The Morse taper locking mechanism was verified using manual distraction before the head was relocated and placed through a range of motion with the findings as described above.  The wound was copiously irrigated with sterile saline solution via the jet lavage system before the peri-incisional and pericapsular tissues were injected with a "cocktail" of 20 cc of Exparel, 30 cc of 0.5% Sensorcaine, 2 cc of Kenalog 40 (80 mg), and 30 mg of Toradol diluted out to 90 cc with normal saline to help with postoperative analgesia. The posterior flap was reapproximated to the posterior aspect of the greater trochanter using #2 Tycron interrupted sutures placed through drill holes. Several additional #2 Tycron interrupted  sutures were used to reinforce this layer of closure. The iliotibial band was reapproximated using #1 Vicryl interrupted sutures before the gluteal fascia was closed using a running #1 Vicryl suture. At this point, 1 g of transexemic acid in 10 cc of normal saline was injected into the joint to help reduce postoperative bleeding. The subcutaneous tissues were closed in several layers using 2-0 Vicryl interrupted sutures before the skin was closed using staples. A sterile occlusive dressing was applied to the wound. The patient was then rolled back into the supine position on his/her hospital bed before being awakened, extubated, and returned to the recovery room in satisfactory condition after tolerating the procedure well.

## 2023-07-25 ENCOUNTER — Encounter: Payer: Self-pay | Admitting: Surgery

## 2023-07-25 DIAGNOSIS — M1612 Unilateral primary osteoarthritis, left hip: Secondary | ICD-10-CM | POA: Diagnosis not present

## 2023-07-25 LAB — CBC
HCT: 31.1 % — ABNORMAL LOW (ref 39.0–52.0)
Hemoglobin: 11.5 g/dL — ABNORMAL LOW (ref 13.0–17.0)
MCH: 31.9 pg (ref 26.0–34.0)
MCHC: 37 g/dL — ABNORMAL HIGH (ref 30.0–36.0)
MCV: 86.1 fL (ref 80.0–100.0)
Platelets: 202 10*3/uL (ref 150–400)
RBC: 3.61 MIL/uL — ABNORMAL LOW (ref 4.22–5.81)
RDW: 12.6 % (ref 11.5–15.5)
WBC: 17.9 10*3/uL — ABNORMAL HIGH (ref 4.0–10.5)
nRBC: 0 % (ref 0.0–0.2)

## 2023-07-25 LAB — BASIC METABOLIC PANEL
Anion gap: 8 (ref 5–15)
BUN: 20 mg/dL (ref 8–23)
CO2: 22 mmol/L (ref 22–32)
Calcium: 9 mg/dL (ref 8.9–10.3)
Chloride: 101 mmol/L (ref 98–111)
Creatinine, Ser: 0.81 mg/dL (ref 0.61–1.24)
GFR, Estimated: >60 mL/min (ref >60)
Glucose, Bld: 205 mg/dL — ABNORMAL HIGH (ref 70–99)
Potassium: 4.1 mmol/L (ref 3.5–5.1)
Sodium: 131 mmol/L — ABNORMAL LOW (ref 135–145)

## 2023-07-25 LAB — GLUCOSE, CAPILLARY
Glucose-Capillary: 167 mg/dL — ABNORMAL HIGH (ref 70–99)
Glucose-Capillary: 223 mg/dL — ABNORMAL HIGH (ref 70–99)

## 2023-07-25 MED ORDER — ONDANSETRON HCL 4 MG PO TABS
4.0000 mg | ORAL_TABLET | Freq: Four times a day (QID) | ORAL | 0 refills | Status: DC | PRN
Start: 2023-07-25 — End: 2023-10-01

## 2023-07-25 MED ORDER — OXYCODONE HCL 5 MG PO TABS: 5.0000 mg | ORAL_TABLET | ORAL | 0 refills | Status: AC | PRN

## 2023-07-25 MED ORDER — APIXABAN 2.5 MG PO TABS: 2.5000 mg | ORAL_TABLET | Freq: Two times a day (BID) | ORAL | 0 refills | Status: DC

## 2023-07-25 NOTE — Progress Notes (Signed)
 Physical Therapy Treatment Patient Details Name: [name] [name] MRN: [id number] DOB: December 07, 1945 Today's Date: 07/25/2023   History of Present Illness [name] [name] is a 78 y.o. male with PMH significant for CAD, CHF, MI, reflux disease, diabetes, neuropathy, prior stroke with left sided weakness, prior hx of apical cardiac thrombus who is now s/p L THA, posterior approach.    PT Comments  Pt was pleasant and motivated to participate during the session and put forth good effort throughout. Pt's spouse present throughout the session with training provided verbally and with cues during functional training on proper sequencing for compliance with posterior hip precautions.  Pt was steady throughout the session including during amb >200 feet with a RW and during stair training per below.  Pt reported no adverse symptoms during the session other than mild L hip pain and was eager to ambulate.  Pt and pt's spouse both reported being ready for discharge home with home health PT with no additional questions or concerns for this PT.  Pt will benefit from continued PT services upon discharge to safely address deficits listed in patient problem list for decreased caregiver assistance and eventual return to PLOF.      If plan is discharge home, recommend the following: A little help with walking and/or transfers;A little help with bathing/dressing/bathroom;Assistance with cooking/housework;Help with stairs or ramp for entrance;Assist for transportation   Can travel by private vehicle        Equipment Recommendations  Rolling walker (2 wheels);BSC/3in1    Recommendations for Other Services       Precautions / Restrictions Precautions Precautions: Fall;Posterior Hip Precaution Booklet Issued: Yes (comment) Recall of Precautions/Restrictions: Impaired Precaution/Restrictions Comments: Multi-modal cueing needed for hip precaution compliance Restrictions Weight Bearing Restrictions Per Provider  Order: Yes LLE Weight Bearing Per Provider Order: Weight bearing as tolerated     Mobility  Bed Mobility Overal bed mobility: Modified Independent             General bed mobility comments: NT    Transfers Overall transfer level: Needs assistance Equipment used: Rolling walker (2 wheels) Transfers: Sit to/from Stand Sit to Stand: Contact guard assist, From elevated surface           General transfer comment: Mod multi-modal cuing for proper sequencing    Ambulation/Gait Ambulation/Gait assistance: Contact guard assist Gait Distance (Feet): 200 Feet Assistive device: Rolling walker (2 wheels) Gait Pattern/deviations: Step-through pattern, Decreased step length - right, Decreased step length - left Gait velocity: decreased     General Gait Details: Mildly reduced cadence, steady throughout with no overt LOB, mod multi-modal cues for sequencing during 90 deg L turn training to prevent CKC L hip IR   Stairs Stairs: Yes Stairs assistance: Contact guard assist Stair Management: Two rails Number of Stairs: 4 General stair comments: Min multi-modal cues for sequencing with good control and stability throughout   Wheelchair Mobility     Tilt Bed    Modified Rankin (Stroke Patients Only)       Balance Overall balance assessment: Needs assistance Sitting-balance support: Feet supported, No upper extremity supported Sitting balance-Leahy Scale: Normal     Standing balance support: During functional activity, Bilateral upper extremity supported Standing balance-Leahy Scale: Good                              Communication Communication Communication: No apparent difficulties Factors Affecting Communication: Hearing impaired  Cognition Arousal: Alert Behavior During Therapy: [facility]  for tasks assessed/performed, Impulsive   PT - Cognitive impairments: Memory, Awareness                       PT - Cognition Comments: Pt required frequent  multi-modal cuing during the session for proper sequencing with functional tasks for posterior hip precaution compliance; able to follow commands well with cuing Following commands: Intact      Cueing Cueing Techniques: Verbal cues, Tactile cues, Visual cues  Exercises Other Exercises Other Exercises: Posterior hip precaution education verbally, with handout, and during functional task training Other Exercises: Car transfer sequencing education provided    General Comments        Pertinent Vitals/Pain Pain Assessment Pain Assessment: 0-10 Pain Score: 4  Pain Location: L hip Pain Descriptors / Indicators: Sore Pain Intervention(s): Premedicated before session, Monitored during session    Home Living Family/patient expects to be discharged to:: Private residence Living Arrangements: Spouse/significant other (spouse currently undergoing chemo and radiation) Available Help at Discharge: Available PRN/intermittently;Family Type of Home: House Home Access: Stairs to enter Entrance Stairs-Rails: Right;Left;Can reach both Entrance Stairs-Number of Steps: 4   Home Layout: One level Home Equipment: Rollator (4 wheels);Cane - quad      Prior Function            PT Goals (current goals can now be found in the care plan section) Acute Rehab PT Goals Patient Stated Goal: To return home PT Goal Formulation: With patient Time For Goal Achievement: 08/07/23 Potential to Achieve Goals: Good Progress towards PT goals: Progressing toward goals    Frequency    BID      PT Plan      Co-evaluation              AM-PAC PT "6 Clicks" Mobility   Outcome Measure  Help needed turning from your back to your side while in a flat bed without using bedrails?: A Little Help needed moving from lying on your back to sitting on the side of a flat bed without using bedrails?: A Little Help needed moving to and from a bed to a chair (including a wheelchair)?: A Little Help needed  standing up from a chair using your arms (e.g., wheelchair or bedside chair)?: A Little Help needed to walk in hospital room?: A Little Help needed climbing 3-5 steps with a railing? : A Little 6 Click Score: 18    End of Session Equipment Utilized During Treatment: Gait belt Activity Tolerance: Patient tolerated treatment well Patient left: in bed;with call bell/phone within reach;Other (comment) (Pt and spouse declined bed alarm) Nurse Communication: Mobility status;Weight bearing status;Precautions PT Visit Diagnosis: Other abnormalities of gait and mobility (R26.89);Muscle weakness (generalized) (M62.81);Pain Pain - Right/Left: Left Pain - part of body: Hip     Time: [id number] (and then 14:12 to 14:30 with break for lunch) PT Time Calculation (min) (ACUTE ONLY): 16 min  Charges:    $Gait Training: 8-22 mins $Therapeutic Activity: 8-22 mins PT General Charges $$ ACUTE PT VISIT: 1 Visit                    D. Scott Alver Leete PT, DPT 07/25/23, 3:32 PM

## 2023-07-25 NOTE — Evaluation (Signed)
 Occupational Therapy Evaluation Patient Details Name: [name] [name] MRN: [id number] DOB: 10/21/1945 Today's Date: 07/25/2023   History of Present Illness   [name] [name] is a 78 y.o. male with PMH significant for CAD, CHF, MI, reflux disease, diabetes, neuropathy, prior stroke with left sided weakness, prior hx of apical cardiac thrombus who is now s/p L THA, posterior approach.     Clinical Impressions [name] [name] was seen for OT evaluation this date, POD#1 from above surgery. Pt was MOD I in all ADLs prior to surgery, using his QC for household and community distances as well as his 4WW when his hip was sore. Pt is eager to return to PLOF with less pain and improved safety and functional independence. Pt currently requires minimal assist for LB dressing while in seated position due to pain and limited AROM of L hip. Pt able to recall 1/3 posterior total hip precautions at start of session and unable to verbalize how to implement during ADL and mobility. Pt instructed in posterior total hip precautions and how to implement, self care skills, falls prevention strategies, home/routines modifications, DME/AE for LB bathing and dressing tasks, compression stocking mgt strategies, and car transfer techniques. At end of session, pt able to independently recall 2/3 posterior total hip precautions. Pt would benefit from additional instruction in self care skills and techniques to help maintain precautions with or without assistive devices to support recall and carryover prior to discharge. Do not anticipate the need for follow up OT services upon acute hospital DC.       If plan is discharge home, recommend the following:   Assistance with cooking/housework;A little help with bathing/dressing/bathroom;Help with stairs or ramp for entrance;Assist for transportation     Functional Status Assessment   Patient has had a recent decline in their functional status and demonstrates the ability to  make significant improvements in function in a reasonable and predictable amount of time.     Equipment Recommendations   BSC/3in1     Recommendations for Other Services         Precautions/Restrictions   Precautions Precautions: Fall;Posterior Hip Precaution Booklet Issued: Yes (comment) Recall of Precautions/Restrictions: Impaired Precaution/Restrictions Comments: Needs cueing t/o OT eval. Restrictions Weight Bearing Restrictions Per Provider Order: Yes LLE Weight Bearing Per Provider Order: Weight bearing as tolerated     Mobility Bed Mobility               General bed mobility comments: NT pt in recliner at start/end of session.    Transfers Overall transfer level: Needs assistance Equipment used: Rolling walker (2 wheels) Transfers: Sit to/from Stand Sit to Stand: Supervision                  Balance Overall balance assessment: Needs assistance Sitting-balance support: Feet supported, No upper extremity supported Sitting balance-Leahy Scale: Normal     Standing balance support: During functional activity, No upper extremity supported Standing balance-Leahy Scale: Good Standing balance comment: steady static standing at sink to wash hands.                           ADL either performed or assessed with clinical judgement   ADL Overall ADL's : Needs assistance/impaired                                       General ADL Comments: SUPERVISION  for safety with STS t/fs, toilet transfer, and standing HH at sink. MIN A for LB ADL management with consistent cueing for adherence to posterior THPs.     Vision Ability to See in Adequate Light: 1 Impaired Patient Visual Report: No change from baseline       Perception         Praxis         Pertinent Vitals/Pain Pain Assessment Pain Assessment: No/denies pain     Extremity/Trunk Assessment Upper Extremity Assessment Upper Extremity Assessment: Overall WFL  for tasks assessed   Lower Extremity Assessment Lower Extremity Assessment: Defer to PT evaluation;LLE deficits/detail LLE Deficits / Details: s/p L THA Post THPs       Communication Communication Communication: No apparent difficulties Factors Affecting Communication: Hearing impaired   Cognition Arousal: Alert Behavior During Therapy: WFL for tasks assessed/performed Cognition: No apparent impairments             OT - Cognition Comments: A&O to person, place, situation. Did not assess for date. Generally able to follow VCs but does need increased cueing for posterior THP's t/o session.                 Following commands: Intact       Cueing  General Comments   Cueing Techniques: Verbal cues      Exercises Other Exercises Other Exercises: Pt educated on role of OT in acute setting, safety, falls prevention strategies for home and hospital, posterior THP's, safe use of AE/DME for ADL Management, and compensatory ADL management strategies.   Shoulder Instructions      Home Living Family/patient expects to be discharged to:: Private residence Living Arrangements: Spouse/significant other (wife currently undergoing chemo) Available Help at Discharge: Available PRN/intermittently Type of Home: House Home Access: Stairs to enter [employer] of Steps: 4 Entrance Stairs-Rails: Right Home Layout: One level     Bathroom Shower/Tub: Tub/shower unit;Walk-in shower   Bathroom Toilet: Handicapped height     Home Equipment: Rollator (4 wheels);Cane - quad          Prior Functioning/Environment Prior Level of Function : Needs assist;Driving             Mobility Comments: Using QC for community distances. Uses 4WW when his hip is hurting ADLs Comments: Indep with ADL/IADL. Will run quick errands, wife primarily does driving.    OT Problem List: Decreased safety awareness;Impaired balance (sitting and/or standing);Decreased knowledge of use of  DME or AE;Decreased knowledge of precautions   OT Treatment/Interventions: Self-care/ADL training;Therapeutic exercise;Balance training;Neuromuscular education;Therapeutic activities;Patient/family education;DME and/or AE instruction      OT Goals(Current goals can be found in the care plan section)   Acute Rehab OT Goals Patient Stated Goal: To go home OT Goal Formulation: With patient Time For Goal Achievement: 08/08/23 Potential to Achieve Goals: Good ADL Goals Pt Will Perform Lower Body Dressing: sit to/from stand;with modified independence;with adaptive equipment (with independent adherence to posterior THPs) Pt Will Transfer to Toilet: bedside commode;with modified independence;with set-up;ambulating (with independent adherence to posterior THPs) Pt Will Perform Toileting - Clothing Manipulation and hygiene: sit to/from stand;with modified independence;with adaptive equipment (with independent adherence to posterior THPs)   OT Frequency:  Min 1X/week    Co-evaluation              AM-PAC OT "6 Clicks" Daily Activity     Outcome Measure Help from another person eating meals?: None Help from another person taking care of personal grooming?: None Help from  another person toileting, which includes using toliet, bedpan, or urinal?: A Little Help from another person bathing (including washing, rinsing, drying)?: A Little Help from another person to put on and taking off regular upper body clothing?: None Help from another person to put on and taking off regular lower body clothing?: A Little 6 Click Score: 21   End of Session Equipment Utilized During Treatment: Rolling walker (2 wheels);Gait belt  Activity Tolerance: Patient tolerated treatment well Patient left: in chair;with chair alarm set;with call bell/phone within reach  OT Visit Diagnosis: Other abnormalities of gait and mobility (R26.89)                Time: 1610-9604 OT Time Calculation (min): 30 min Charges:   OT General Charges $OT Visit: 1 Visit OT Evaluation $OT Eval Moderate Complexity: 1 Mod OT Treatments $Self Care/Home Management : 8-22 mins  Rockney Ghee, M.S., OTR/L 07/25/23, 11:56 AM

## 2023-07-25 NOTE — Plan of Care (Signed)
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
## 2023-07-25 NOTE — Progress Notes (Signed)
 Patient is not able to walk the distance required to go the bathroom, or he/she is unable to safely negotiate stairs required to access the bathroom.  A 3in1 BSC will alleviate this problem
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
## 2023-07-25 NOTE — TOC Progression Note (Signed)
 Transition of Care [facility]) - Progression Note    Patient Details  Name: [name] [name] MRN: [id number] Date of Birth: March 30, 1946  [facility] CM/SW Contact  Marlowe Sax, RN Phone Number: 07/25/2023, 11:25 AM  Clinical Narrative:    [employer] to deliver a 3 in 1 and RW to the bedside, wife coming at noon [facility] set up for [facility]   Expected Discharge Plan: Home w Home Health Services Barriers to Discharge: Barriers Resolved  Expected Discharge Plan and Services   Discharge Planning Services: CM Consult   Living arrangements for the past 2 months: Single Family Home                 DME Arranged: Dan Humphreys rolling, 3-N-1 DME Agency: [employer] Date DME Agency Contacted: 07/25/23 Time DME Agency Contacted: [phone] Representative spoke with at DME Agency: Cletis [place] HH Arranged: PT, OT [facility] Agency: [facility] Date [facility] Agency Contacted: 07/25/23 Time HH Agency Contacted: [phone] Representative spoke with at [facility] Agency: [place]   Social Determinants of Health (SDOH) Interventions SDOH Screenings   Food Insecurity: No Food Insecurity (07/25/2023)  Housing: Low Risk  (07/25/2023)  Transportation Needs: No Transportation Needs (07/25/2023)  Utilities: Not At Risk (07/25/2023)  Financial Resource Strain: Low Risk  (12/11/2022)   Received from [facility]  Social Connections: Moderately Integrated (07/25/2023)  Tobacco Use: High Risk (07/24/2023)    Readmission Risk Interventions     No data to display

## 2023-07-25 NOTE — Evaluation (Signed)
 Physical Therapy Evaluation Patient Details Name: [name] [name] MRN: [id number] DOB: 12-Nov-1945 Today's Date: 07/25/2023  History of Present Illness  Anthem Frazer is a 78 y.o. male with PMH significant for CAD, CHF, MI, reflux disease, diabetes, neuropathy, prior stroke with left sided weakness, prior hx of apical cardiac thrombus who is now s/p L THA, posterior approach.   Clinical Impression  Pt was pleasant and motivated to participate during the session and put forth good effort throughout. Pt required no physical assistance during the session but did require multi-modal cuing for proper sequencing during functional tasks training to maintain posterior hip precaution compliance.  Pt reported no adverse symptoms during the session other than moderate L hip pain that did not worsen with activity.  Pt will benefit from continued PT services upon discharge to safely address deficits listed in patient problem list for decreased caregiver assistance and eventual return to PLOF.          If plan is discharge home, recommend the following: A little help with walking and/or transfers;A little help with bathing/dressing/bathroom;Assistance with cooking/housework;Help with stairs or ramp for entrance;Assist for transportation   Can travel by private vehicle        Equipment Recommendations Rolling walker (2 wheels);BSC/3in1  Recommendations for Other Services       Functional Status Assessment Patient has had a recent decline in their functional status and demonstrates the ability to make significant improvements in function in a reasonable and predictable amount of time.     Precautions / Restrictions Precautions Precautions: Fall;Posterior Hip Precaution Booklet Issued: Yes (comment) Recall of Precautions/Restrictions: Impaired Precaution/Restrictions Comments: Multi-modal cueing needed for hip precaution compliance Restrictions Weight Bearing Restrictions Per Provider Order:  Yes LLE Weight Bearing Per Provider Order: Weight bearing as tolerated      Mobility  Bed Mobility Overal bed mobility: Modified Independent             General bed mobility comments: Min extra time and effort only    Transfers Overall transfer level: Needs assistance Equipment used: Rolling walker (2 wheels) Transfers: Sit to/from Stand Sit to Stand: Contact guard assist, From elevated surface           General transfer comment: Mod multi-modal cuing for proper sequencing    Ambulation/Gait Ambulation/Gait assistance: Contact guard assist Gait Distance (Feet): 125 Feet Assistive device: Rolling walker (2 wheels) Gait Pattern/deviations: Step-through pattern, Decreased step length - right, Decreased step length - left Gait velocity: decreased     General Gait Details: Mildly reduced cadence, steady throughout with no overt LOB, mod multi-modal cues for sequencing during 90 deg L turn training to prevent CKC L hip IR  Stairs Stairs: Yes Stairs assistance: Contact guard assist Stair Management: Two rails Number of Stairs: 4 General stair comments: Mod multi-modal cues for sequencing with good control and stability  Wheelchair Mobility     Tilt Bed    Modified Rankin (Stroke Patients Only)       Balance Overall balance assessment: Needs assistance Sitting-balance support: Feet supported, No upper extremity supported Sitting balance-Leahy Scale: Normal     Standing balance support: During functional activity, Bilateral upper extremity supported Standing balance-Leahy Scale: Good                               Pertinent Vitals/Pain Pain Assessment Pain Assessment: 0-10 Pain Score: 5  Pain Location: L hip Pain Descriptors / Indicators: Sore Pain Intervention(s): Repositioned, Premedicated  before session, Monitored during session, RN gave pain meds during session    Home Living Family/patient expects to be discharged to:: Private  residence Living Arrangements: Spouse/significant other (spouse currently undergoing chemo and radiation) Available Help at Discharge: Available PRN/intermittently;Family Type of Home: House Home Access: Stairs to enter Entrance Stairs-Rails: Right;Left;Can reach both Entrance Stairs-Number of Steps: 4   Home Layout: One level Home Equipment: Rollator (4 wheels);Cane - quad      Prior Function Prior Level of Function : Independent/Modified Independent             Mobility Comments: Mod Ind amb mostly with a QC but uses a rollator PRN ADLs Comments: Indep with ADL/IADL. Will run quick errands, wife primarily does driving.     Extremity/Trunk Assessment   Upper Extremity Assessment Upper Extremity Assessment: Overall WFL for tasks assessed    Lower Extremity Assessment Lower Extremity Assessment: Generalized weakness;LLE deficits/detail LLE Deficits / Details: s/p L THA Post THPs LLE: Unable to fully assess due to pain       Communication   Communication Communication: No apparent difficulties Factors Affecting Communication: Hearing impaired    Cognition Arousal: Alert Behavior During Therapy: WFL for tasks assessed/performed, Impulsive   PT - Cognitive impairments: Memory, Awareness                       PT - Cognition Comments: Pt required frequent multi-modal cuing during the session for proper sequencing with functional tasks for posterior hip precaution compliance; able to follow commands well with cuing Following commands: Intact       Cueing Cueing Techniques: Verbal cues, Tactile cues, Visual cues     General Comments      Exercises Other Exercises Other Exercises: Posterior hip precaution education verbally, with handout, and during functional task training Other Exercises: Car transfer sequencing education provided   Assessment/Plan    PT Assessment Patient needs continued PT services  PT Problem List Decreased strength;Decreased  activity tolerance;Decreased balance;Decreased mobility;Decreased knowledge of use of DME;Decreased safety awareness;Decreased knowledge of precautions;Pain       PT Treatment Interventions DME instruction;Gait training;Stair training;Functional mobility training;Therapeutic activities;Therapeutic exercise;Balance training;Patient/family education    PT Goals (Current goals can be found in the Care Plan section)  Acute Rehab PT Goals Patient Stated Goal: To return home PT Goal Formulation: With patient Time For Goal Achievement: 08/07/23 Potential to Achieve Goals: Good    Frequency BID     Co-evaluation               AM-PAC PT "6 Clicks" Mobility  Outcome Measure Help needed turning from your back to your side while in a flat bed without using bedrails?: A Little Help needed moving from lying on your back to sitting on the side of a flat bed without using bedrails?: A Little Help needed moving to and from a bed to a chair (including a wheelchair)?: A Little Help needed standing up from a chair using your arms (e.g., wheelchair or bedside chair)?: A Little Help needed to walk in hospital room?: A Little Help needed climbing 3-5 steps with a railing? : A Little 6 Click Score: 18    End of Session Equipment Utilized During Treatment: Gait belt Activity Tolerance: Patient tolerated treatment well Patient left: in bed;with call bell/phone within reach;with bed alarm set;with SCD's reapplied (Pt declined up in chair) Nurse Communication: Mobility status;Weight bearing status;Precautions PT Visit Diagnosis: Other abnormalities of gait and mobility (R26.89);Muscle weakness (generalized) (M62.81);Pain Pain -  Right/Left: Left Pain - part of body: Hip    Time: 1610-9604 PT Time Calculation (min) (ACUTE ONLY): 37 min   Charges:   PT Evaluation $PT Eval Moderate Complexity: 1 Mod PT Treatments $Gait Training: 8-22 mins $Therapeutic Activity: 8-22 mins PT General Charges $$  ACUTE PT VISIT: 1 Visit    D. Elly Modena PT, DPT 07/25/23, 12:50 PM

## 2023-09-17 ENCOUNTER — Emergency Department

## 2023-09-17 ENCOUNTER — Other Ambulatory Visit: Payer: Self-pay

## 2023-09-17 ENCOUNTER — Encounter: Payer: Self-pay | Admitting: *Deleted

## 2023-09-17 ENCOUNTER — Inpatient Hospital Stay
Admission: EM | Admit: 2023-09-17 | Discharge: 2023-09-19 | DRG: 683 | Disposition: A | Discharge status: Home / Self Care | Attending: Family Medicine | Admitting: Family Medicine

## 2023-09-17 DIAGNOSIS — W182XXA Fall in (into) shower or empty bathtub, initial encounter: Secondary | ICD-10-CM | POA: Diagnosis present

## 2023-09-17 DIAGNOSIS — Z7984 Long term (current) use of oral hypoglycemic drugs: Secondary | ICD-10-CM

## 2023-09-17 DIAGNOSIS — R310 Gross hematuria: Secondary | ICD-10-CM | POA: Diagnosis present

## 2023-09-17 DIAGNOSIS — N179 Acute kidney failure, unspecified: Principal | ICD-10-CM | POA: Diagnosis present

## 2023-09-17 DIAGNOSIS — N1330 Unspecified hydronephrosis: Secondary | ICD-10-CM | POA: Insufficient documentation

## 2023-09-17 DIAGNOSIS — I252 Old myocardial infarction: Secondary | ICD-10-CM

## 2023-09-17 DIAGNOSIS — Z888 Allergy status to other drugs, medicaments and biological substances status: Secondary | ICD-10-CM

## 2023-09-17 DIAGNOSIS — R32 Unspecified urinary incontinence: Secondary | ICD-10-CM | POA: Diagnosis present

## 2023-09-17 DIAGNOSIS — Z96642 Presence of left artificial hip joint: Secondary | ICD-10-CM | POA: Diagnosis present

## 2023-09-17 DIAGNOSIS — E1165 Type 2 diabetes mellitus with hyperglycemia: Secondary | ICD-10-CM | POA: Diagnosis not present

## 2023-09-17 DIAGNOSIS — Z91018 Allergy to other foods: Secondary | ICD-10-CM

## 2023-09-17 DIAGNOSIS — M1990 Unspecified osteoarthritis, unspecified site: Secondary | ICD-10-CM | POA: Diagnosis present

## 2023-09-17 DIAGNOSIS — I2510 Atherosclerotic heart disease of native coronary artery without angina pectoris: Secondary | ICD-10-CM | POA: Diagnosis present

## 2023-09-17 DIAGNOSIS — Z951 Presence of aortocoronary bypass graft: Secondary | ICD-10-CM

## 2023-09-17 DIAGNOSIS — L03116 Cellulitis of left lower limb: Secondary | ICD-10-CM | POA: Diagnosis not present

## 2023-09-17 DIAGNOSIS — Z880 Allergy status to penicillin: Secondary | ICD-10-CM

## 2023-09-17 DIAGNOSIS — R109 Unspecified abdominal pain: Secondary | ICD-10-CM

## 2023-09-17 DIAGNOSIS — Z833 Family history of diabetes mellitus: Secondary | ICD-10-CM

## 2023-09-17 DIAGNOSIS — Y92002 Bathroom of unspecified non-institutional (private) residence single-family (private) house as the place of occurrence of the external cause: Secondary | ICD-10-CM

## 2023-09-17 DIAGNOSIS — R338 Other retention of urine: Secondary | ICD-10-CM

## 2023-09-17 DIAGNOSIS — K5730 Diverticulosis of large intestine without perforation or abscess without bleeding: Secondary | ICD-10-CM | POA: Diagnosis present

## 2023-09-17 DIAGNOSIS — Y93E1 Activity, personal bathing and showering: Secondary | ICD-10-CM

## 2023-09-17 DIAGNOSIS — N131 Hydronephrosis with ureteral stricture, not elsewhere classified: Secondary | ICD-10-CM | POA: Diagnosis present

## 2023-09-17 DIAGNOSIS — Z9049 Acquired absence of other specified parts of digestive tract: Secondary | ICD-10-CM

## 2023-09-17 DIAGNOSIS — I513 Intracardiac thrombosis, not elsewhere classified: Secondary | ICD-10-CM | POA: Diagnosis present

## 2023-09-17 DIAGNOSIS — Z79891 Long term (current) use of opiate analgesic: Secondary | ICD-10-CM

## 2023-09-17 DIAGNOSIS — I110 Hypertensive heart disease with heart failure: Secondary | ICD-10-CM | POA: Diagnosis present

## 2023-09-17 DIAGNOSIS — R339 Retention of urine, unspecified: Secondary | ICD-10-CM

## 2023-09-17 DIAGNOSIS — Z974 Presence of external hearing-aid: Secondary | ICD-10-CM

## 2023-09-17 DIAGNOSIS — E1142 Type 2 diabetes mellitus with diabetic polyneuropathy: Secondary | ICD-10-CM | POA: Diagnosis present

## 2023-09-17 DIAGNOSIS — Z9841 Cataract extraction status, right eye: Secondary | ICD-10-CM

## 2023-09-17 DIAGNOSIS — E66812 Obesity, class 2: Secondary | ICD-10-CM | POA: Diagnosis present

## 2023-09-17 DIAGNOSIS — Z79899 Other long term (current) drug therapy: Secondary | ICD-10-CM

## 2023-09-17 DIAGNOSIS — I5032 Chronic diastolic (congestive) heart failure: Secondary | ICD-10-CM | POA: Diagnosis present

## 2023-09-17 DIAGNOSIS — F17210 Nicotine dependence, cigarettes, uncomplicated: Secondary | ICD-10-CM | POA: Diagnosis present

## 2023-09-17 DIAGNOSIS — Z8673 Personal history of transient ischemic attack (TIA), and cerebral infarction without residual deficits: Secondary | ICD-10-CM

## 2023-09-17 DIAGNOSIS — E785 Hyperlipidemia, unspecified: Secondary | ICD-10-CM | POA: Diagnosis present

## 2023-09-17 DIAGNOSIS — Z6835 Body mass index (BMI) 35.0-35.9, adult: Secondary | ICD-10-CM

## 2023-09-17 DIAGNOSIS — G894 Chronic pain syndrome: Secondary | ICD-10-CM | POA: Diagnosis present

## 2023-09-17 DIAGNOSIS — E11649 Type 2 diabetes mellitus with hypoglycemia without coma: Secondary | ICD-10-CM | POA: Diagnosis not present

## 2023-09-17 DIAGNOSIS — Z7902 Long term (current) use of antithrombotics/antiplatelets: Secondary | ICD-10-CM

## 2023-09-17 DIAGNOSIS — Z7901 Long term (current) use of anticoagulants: Secondary | ICD-10-CM

## 2023-09-17 DIAGNOSIS — K219 Gastro-esophageal reflux disease without esophagitis: Secondary | ICD-10-CM | POA: Diagnosis present

## 2023-09-17 DIAGNOSIS — I255 Ischemic cardiomyopathy: Secondary | ICD-10-CM | POA: Diagnosis present

## 2023-09-17 DIAGNOSIS — Z7985 Long-term (current) use of injectable non-insulin antidiabetic drugs: Secondary | ICD-10-CM

## 2023-09-17 DIAGNOSIS — Z961 Presence of intraocular lens: Secondary | ICD-10-CM | POA: Diagnosis present

## 2023-09-17 DIAGNOSIS — E1151 Type 2 diabetes mellitus with diabetic peripheral angiopathy without gangrene: Secondary | ICD-10-CM | POA: Diagnosis present

## 2023-09-17 DIAGNOSIS — Z9842 Cataract extraction status, left eye: Secondary | ICD-10-CM

## 2023-09-17 DIAGNOSIS — H9191 Unspecified hearing loss, right ear: Secondary | ICD-10-CM | POA: Diagnosis present

## 2023-09-17 LAB — CBC
HCT: 33.9 % — ABNORMAL LOW (ref 39.0–52.0)
Hemoglobin: 11.5 g/dL — ABNORMAL LOW (ref 13.0–17.0)
MCH: 31.3 pg (ref 26.0–34.0)
MCHC: 33.9 g/dL (ref 30.0–36.0)
MCV: 92.1 fL (ref 80.0–100.0)
Platelets: 242 10*3/uL (ref 150–400)
RBC: 3.68 MIL/uL — ABNORMAL LOW (ref 4.22–5.81)
RDW: 13.6 % (ref 11.5–15.5)
WBC: 5.4 10*3/uL (ref 4.0–10.5)
nRBC: 0 % (ref 0.0–0.2)

## 2023-09-17 LAB — COMPREHENSIVE METABOLIC PANEL WITH GFR
ALT: 10 U/L (ref 0–44)
AST: 14 U/L — ABNORMAL LOW (ref 15–41)
Albumin: 3.7 g/dL (ref 3.5–5.0)
Alkaline Phosphatase: 68 U/L (ref 38–126)
Anion gap: 5 (ref 5–15)
BUN: 14 mg/dL (ref 8–23)
CO2: 28 mmol/L (ref 22–32)
Calcium: 9.7 mg/dL (ref 8.9–10.3)
Chloride: 107 mmol/L (ref 98–111)
Creatinine, Ser: 1.72 mg/dL — ABNORMAL HIGH (ref 0.61–1.24)
GFR, Estimated: 41 mL/min — ABNORMAL LOW (ref >60)
Glucose, Bld: 89 mg/dL (ref 70–99)
Potassium: 4 mmol/L (ref 3.5–5.1)
Sodium: 140 mmol/L (ref 135–145)
Total Bilirubin: 0.5 mg/dL (ref 0.0–1.2)
Total Protein: 6.8 g/dL (ref 6.5–8.1)

## 2023-09-17 LAB — URINALYSIS, ROUTINE W REFLEX MICROSCOPIC
Bacteria, UA: NONE SEEN
Bilirubin Urine: NEGATIVE
Glucose, UA: NEGATIVE mg/dL
Ketones, ur: NEGATIVE mg/dL
Leukocytes,Ua: NEGATIVE
Nitrite: NEGATIVE
Protein, ur: NEGATIVE mg/dL
Specific Gravity, Urine: 1.003 — ABNORMAL LOW (ref 1.005–1.030)
Squamous Epithelial / HPF: 0 /HPF (ref 0–5)
pH: 6 (ref 5.0–8.0)

## 2023-09-17 LAB — TROPONIN I (HIGH SENSITIVITY)
Troponin I (High Sensitivity): 27 ng/L — ABNORMAL HIGH (ref <18)
Troponin I (High Sensitivity): 28 ng/L — ABNORMAL HIGH (ref <18)

## 2023-09-17 LAB — LIPASE, BLOOD: Lipase: 21 U/L (ref 11–51)

## 2023-09-17 MED ORDER — INSULIN ASPART 100 UNIT/ML IJ SOLN: 0.0000 [IU] | Freq: Three times a day (TID) | INTRAMUSCULAR | Status: DC

## 2023-09-17 MED ORDER — ACETAMINOPHEN 650 MG RE SUPP: 650.0000 mg | Freq: Four times a day (QID) | RECTAL | Status: DC | PRN

## 2023-09-17 MED ORDER — ONDANSETRON HCL 4 MG PO TABS
4.0000 mg | ORAL_TABLET | Freq: Four times a day (QID) | ORAL | Status: DC | PRN
Start: 2023-09-17 — End: 2023-09-19

## 2023-09-17 MED ORDER — GABAPENTIN 300 MG PO CAPS
600.0000 mg | ORAL_CAPSULE | Freq: Every day | ORAL | Status: DC
  Administered 2023-09-18: 600 mg via ORAL
  Filled 2023-09-17: qty 2

## 2023-09-17 MED ORDER — SODIUM CHLORIDE 0.9 % IV BOLUS
1000.0000 mL | Freq: Once | INTRAVENOUS | Status: AC
  Administered 2023-09-17: 1000 mL via INTRAVENOUS

## 2023-09-17 MED ORDER — TAMSULOSIN HCL 0.4 MG PO CAPS
0.4000 mg | ORAL_CAPSULE | Freq: Every day | ORAL | Status: DC
  Administered 2023-09-18 – 2023-09-19 (×2): 0.4 mg via ORAL
  Filled 2023-09-17 (×2): qty 1

## 2023-09-17 MED ORDER — APIXABAN 2.5 MG PO TABS: 2.5000 mg | ORAL_TABLET | Freq: Two times a day (BID) | ORAL | Status: DC

## 2023-09-17 MED ORDER — MORPHINE SULFATE (PF) 2 MG/ML IV SOLN
2.0000 mg | INTRAVENOUS | Status: DC | PRN
  Administered 2023-09-19: 2 mg via INTRAVENOUS
  Filled 2023-09-17: qty 1

## 2023-09-17 MED ORDER — SODIUM CHLORIDE 0.9 % IV SOLN
2.0000 g | Freq: Once | INTRAVENOUS | Status: AC
  Administered 2023-09-17: 2 g via INTRAVENOUS
  Filled 2023-09-17: qty 20

## 2023-09-17 MED ORDER — ALBUTEROL SULFATE (2.5 MG/3ML) 0.083% IN NEBU: 2.5000 mg | INHALATION_SOLUTION | RESPIRATORY_TRACT | Status: DC | PRN

## 2023-09-17 MED ORDER — SODIUM CHLORIDE 0.9 % IV SOLN
2.0000 g | INTRAVENOUS | Status: DC
  Administered 2023-09-18: 2 g via INTRAVENOUS
  Filled 2023-09-17 (×2): qty 20

## 2023-09-17 MED ORDER — GABAPENTIN 300 MG PO CAPS
300.0000 mg | ORAL_CAPSULE | Freq: Every day | ORAL | Status: DC
  Administered 2023-09-18 – 2023-09-19 (×2): 300 mg via ORAL
  Filled 2023-09-17 (×2): qty 1

## 2023-09-17 MED ORDER — GABAPENTIN 300 MG PO CAPS
300.0000 mg | ORAL_CAPSULE | ORAL | Status: DC
Start: 2023-09-17 — End: 2023-09-17

## 2023-09-17 MED ORDER — ONDANSETRON HCL 4 MG/2ML IJ SOLN
4.0000 mg | Freq: Four times a day (QID) | INTRAMUSCULAR | Status: DC | PRN
  Administered 2023-09-19: 4 mg via INTRAVENOUS
  Filled 2023-09-17: qty 2

## 2023-09-17 MED ORDER — IOHEXOL 300 MG/ML  SOLN
80.0000 mL | Freq: Once | INTRAMUSCULAR | Status: AC | PRN
  Administered 2023-09-17: 80 mL via INTRAVENOUS

## 2023-09-17 MED ORDER — ACETAMINOPHEN 325 MG PO TABS: 650.0000 mg | ORAL_TABLET | Freq: Four times a day (QID) | ORAL | Status: DC | PRN

## 2023-09-17 MED ORDER — FENTANYL CITRATE PF 50 MCG/ML IJ SOSY
50.0000 ug | PREFILLED_SYRINGE | Freq: Once | INTRAMUSCULAR | Status: AC
  Administered 2023-09-17: 50 ug via INTRAVENOUS
  Filled 2023-09-17: qty 1

## 2023-09-17 MED ORDER — INSULIN ASPART 100 UNIT/ML IJ SOLN: 0.0000 [IU] | Freq: Every day | INTRAMUSCULAR | Status: DC

## 2023-09-17 MED ORDER — ONDANSETRON HCL 4 MG/2ML IJ SOLN
4.0000 mg | Freq: Once | INTRAMUSCULAR | Status: AC
  Administered 2023-09-17: 4 mg via INTRAVENOUS
  Filled 2023-09-17: qty 2

## 2023-09-17 MED ORDER — OXYCODONE HCL 5 MG PO TABS
5.0000 mg | ORAL_TABLET | ORAL | Status: DC | PRN
  Administered 2023-09-18: 10 mg via ORAL
  Administered 2023-09-18: 5 mg via ORAL
  Administered 2023-09-19 (×2): 10 mg via ORAL
  Filled 2023-09-17 (×4): qty 2

## 2023-09-17 MED ORDER — CYCLOBENZAPRINE HCL 10 MG PO TABS: 10.0000 mg | ORAL_TABLET | Freq: Three times a day (TID) | ORAL | Status: DC | PRN

## 2023-09-17 NOTE — ED Notes (Signed)
 Pt incontinent to bladder. Brief changed.

## 2023-09-17 NOTE — ED Triage Notes (Signed)
 Pt brought in via ems from home.  Pt reports dizziness and abd pain .   Pt denies chest pain or sob.  No v/d.   Pt alert  speech clear.

## 2023-09-17 NOTE — Assessment & Plan Note (Signed)
 Left lower extremity swelling Continue Rocephin Follow lower extremity venous ultrasound
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
## 2023-09-17 NOTE — Assessment & Plan Note (Signed)
 Suspect secondary to acute urinary retention Workup otherwise unremarkable for intra-abdominal and intrapelvic pathology or acute infection Monitor for improvement with Foley decompression Pain control

## 2023-09-17 NOTE — ED Triage Notes (Signed)
 Arrives from home via [facility].  C/O abd pain. Vomiting x 3 days, N/Dizzy, loss of appetite.  Felt dizzy today in the shower.  HX:  DM, ?CABG.  Cellulitis to Lower Left Leg, on antibiotics.  98.3 CBG: 108 76 97% RA 156/82

## 2023-09-17 NOTE — ED Notes (Signed)
Bladder scan >999 ml.

## 2023-09-17 NOTE — Assessment & Plan Note (Addendum)
 Bilateral hydroureteronephrosis Gross hematuria -blood clots in urine (on apixaban) Return of 3 L with Foley decompression [facility] Urology consulted-will keep n.p.o. tonight Hold apixaban Monitor H&H

## 2023-09-17 NOTE — ED Provider Notes (Signed)
 [facility] Provider Note    Event Date/Time   First MD Initiated Contact with Patient 09/17/23 1811     (approximate)   History   Abdominal Pain and Dizziness   HPI  [name] [name] is a 78 y.o. male with history of diabetes, prior cholecystectomy, CHF, bypass who comes in with concerns for abdominal pain, dizziness.  I reviewed a note from 08/28/2023 where patient was given some fluids for renal issues.  I reviewed with patient his creatinine was 1.5.  Patient comes in today with vomiting for the past 3 days with abdominal pain then had episode of dizziness in the shower.  Patient currently on antibiotics for cellulitis.  Patient denies any dizziness at this time.  He states the dizziness was when he was showering.  He states however he is not sure if it will come back.  Patient denies any chest pain or shortness of breath.  Physical Exam   Triage Vital Signs: ED Triage Vitals  Encounter Vitals Group     BP 09/17/23 1748 137/84     Systolic BP Percentile --      Diastolic BP Percentile --      Pulse Rate 09/17/23 1748 72     Resp 09/17/23 1748 17     Temp 09/17/23 1748 97.8 F (36.6 C)     Temp Source 09/17/23 1748 Oral     SpO2 09/17/23 1748 100 %     Weight 09/17/23 1751 240 lb (108.9 kg)     Height 09/17/23 1751 5\' 9"  (1.753 m)     Head Circumference --      Peak Flow --      Pain Score 09/17/23 1751 5     Pain Loc --      Pain Education --      Exclude from Growth Chart --     Most recent vital signs: Vitals:   09/17/23 1748  BP: 137/84  Pulse: 72  Resp: 17  Temp: 97.8 F (36.6 C)  SpO2: 100%     General: Awake, no distress.  CV:  Good peripheral perfusion.  Resp:  Normal effort.  Abd:  No distention.  Tender in the lower abdomen Other:  Left leg with cellulitis good distal pulse swelling noted at the leg. nIHSS zero  ED Results / Procedures / Treatments   Labs (all labs ordered are listed, but only abnormal results are  displayed) Labs Reviewed  CBC - Abnormal; Notable for the following components:      Result Value   RBC 3.68 (*)    Hemoglobin 11.5 (*)    HCT 33.9 (*)    All other components within normal limits  LIPASE, BLOOD  COMPREHENSIVE METABOLIC PANEL WITH GFR  URINALYSIS, ROUTINE W REFLEX MICROSCOPIC  TROPONIN I (HIGH SENSITIVITY)     EKG  My interpretation of EKG:  Normal sinus rate of 72 without any ST elevation or T wave inversions, normal intervals  RADIOLOGY I have reviewed the CT personally and interpreted no ICH    PROCEDURES:  Critical Care performed: No  .1-3 Lead EKG Interpretation  Performed by: Concha Se, MD Authorized by: Concha Se, MD     Interpretation: normal     ECG rate:  70   ECG rate assessment: normal     Rhythm: sinus rhythm     Ectopy: none     Conduction: normal      MEDICATIONS ORDERED IN ED: Medications  cefTRIAXone (ROCEPHIN)  2 g in sodium chloride 0.9 % 100 mL IVPB (2 g Intravenous New Bag/Given 09/17/23 2113)  sodium chloride 0.9 % bolus 1,000 mL (1,000 mLs Intravenous New Bag/Given 09/17/23 1847)  fentaNYL (SUBLIMAZE) injection 50 mcg (50 mcg Intravenous Given 09/17/23 1848)  ondansetron (ZOFRAN) injection 4 mg (4 mg Intravenous Given 09/17/23 1849)  iohexol (OMNIPAQUE) 300 MG/ML solution 80 mL (80 mLs Intravenous Contrast Given 09/17/23 1911)     IMPRESSION / MDM / ASSESSMENT AND PLAN / ED COURSE  I reviewed the triage vital signs and the nursing notes.   Patient's presentation is most consistent with acute presentation with potential threat to life or bodily function.  Dehydration, AKI, abdominal pathology such as appendicitis, diverticulitis.  Troponin is slightly elevated but could be due to renal issues.  His lipase is normal.  CMP shows elevated creatinine up to 1.7.  Patient will get some IV fluids.  His hemoglobin is stable.  I reviewed the CT personally and patient has distended bladder with looks like bilateral  hydronephrosis.  Will see if patient can urinate  Patient unable to urinate.  Foley was placed.  Concerns for urinary retention. Reviewed imaged of CT with Dr Rosea Conch and no concerns for other surgical processes.  Patient reports continued left leg redness even with antibiotics therefore given he failed outpatient treatment as well as with new urinary retention and AKI and dizziness we will discuss the hospital team for admission.  CT final read and ultrasound read are pending at time of disposition  The patient is on the cardiac monitor to evaluate for evidence of arrhythmia and/or significant heart rate changes.      FINAL CLINICAL IMPRESSION(S) / ED DIAGNOSES   Final diagnoses:  Cellulitis of left lower extremity  AKI (acute kidney injury) (HCC)  Urinary retention     Rx / DC Orders   ED Discharge Orders     None        Note:  This document was prepared using Dragon voice recognition software and may include unintentional dictation errors.   [name], MD 09/17/23 2124

## 2023-09-18 DIAGNOSIS — Y93E1 Activity, personal bathing and showering: Secondary | ICD-10-CM | POA: Diagnosis not present

## 2023-09-18 DIAGNOSIS — R1030 Lower abdominal pain, unspecified: Secondary | ICD-10-CM

## 2023-09-18 DIAGNOSIS — E1165 Type 2 diabetes mellitus with hyperglycemia: Secondary | ICD-10-CM | POA: Diagnosis not present

## 2023-09-18 DIAGNOSIS — K5730 Diverticulosis of large intestine without perforation or abscess without bleeding: Secondary | ICD-10-CM | POA: Diagnosis present

## 2023-09-18 DIAGNOSIS — E1151 Type 2 diabetes mellitus with diabetic peripheral angiopathy without gangrene: Secondary | ICD-10-CM | POA: Diagnosis present

## 2023-09-18 DIAGNOSIS — R339 Retention of urine, unspecified: Secondary | ICD-10-CM

## 2023-09-18 DIAGNOSIS — L03116 Cellulitis of left lower limb: Secondary | ICD-10-CM | POA: Diagnosis present

## 2023-09-18 DIAGNOSIS — Y92002 Bathroom of unspecified non-institutional (private) residence single-family (private) house as the place of occurrence of the external cause: Secondary | ICD-10-CM | POA: Diagnosis not present

## 2023-09-18 DIAGNOSIS — Z6835 Body mass index (BMI) 35.0-35.9, adult: Secondary | ICD-10-CM | POA: Diagnosis not present

## 2023-09-18 DIAGNOSIS — N131 Hydronephrosis with ureteral stricture, not elsewhere classified: Secondary | ICD-10-CM | POA: Diagnosis present

## 2023-09-18 DIAGNOSIS — R338 Other retention of urine: Secondary | ICD-10-CM | POA: Diagnosis not present

## 2023-09-18 DIAGNOSIS — N179 Acute kidney failure, unspecified: Secondary | ICD-10-CM | POA: Diagnosis present

## 2023-09-18 DIAGNOSIS — Z8673 Personal history of transient ischemic attack (TIA), and cerebral infarction without residual deficits: Secondary | ICD-10-CM | POA: Diagnosis not present

## 2023-09-18 DIAGNOSIS — I2510 Atherosclerotic heart disease of native coronary artery without angina pectoris: Secondary | ICD-10-CM | POA: Diagnosis present

## 2023-09-18 DIAGNOSIS — E11649 Type 2 diabetes mellitus with hypoglycemia without coma: Secondary | ICD-10-CM | POA: Diagnosis not present

## 2023-09-18 DIAGNOSIS — I110 Hypertensive heart disease with heart failure: Secondary | ICD-10-CM | POA: Diagnosis present

## 2023-09-18 DIAGNOSIS — N134 Hydroureter: Secondary | ICD-10-CM

## 2023-09-18 DIAGNOSIS — Z7901 Long term (current) use of anticoagulants: Secondary | ICD-10-CM | POA: Diagnosis not present

## 2023-09-18 DIAGNOSIS — E785 Hyperlipidemia, unspecified: Secondary | ICD-10-CM | POA: Diagnosis present

## 2023-09-18 DIAGNOSIS — N1330 Unspecified hydronephrosis: Secondary | ICD-10-CM | POA: Diagnosis not present

## 2023-09-18 DIAGNOSIS — I513 Intracardiac thrombosis, not elsewhere classified: Secondary | ICD-10-CM | POA: Diagnosis present

## 2023-09-18 DIAGNOSIS — H9191 Unspecified hearing loss, right ear: Secondary | ICD-10-CM | POA: Diagnosis present

## 2023-09-18 DIAGNOSIS — R310 Gross hematuria: Secondary | ICD-10-CM | POA: Diagnosis present

## 2023-09-18 DIAGNOSIS — E1142 Type 2 diabetes mellitus with diabetic polyneuropathy: Secondary | ICD-10-CM | POA: Diagnosis present

## 2023-09-18 DIAGNOSIS — E66812 Obesity, class 2: Secondary | ICD-10-CM | POA: Diagnosis present

## 2023-09-18 DIAGNOSIS — Z951 Presence of aortocoronary bypass graft: Secondary | ICD-10-CM | POA: Diagnosis not present

## 2023-09-18 DIAGNOSIS — F17210 Nicotine dependence, cigarettes, uncomplicated: Secondary | ICD-10-CM | POA: Diagnosis present

## 2023-09-18 DIAGNOSIS — W182XXA Fall in (into) shower or empty bathtub, initial encounter: Secondary | ICD-10-CM | POA: Diagnosis present

## 2023-09-18 DIAGNOSIS — I255 Ischemic cardiomyopathy: Secondary | ICD-10-CM | POA: Diagnosis present

## 2023-09-18 DIAGNOSIS — G894 Chronic pain syndrome: Secondary | ICD-10-CM | POA: Diagnosis present

## 2023-09-18 DIAGNOSIS — I5032 Chronic diastolic (congestive) heart failure: Secondary | ICD-10-CM | POA: Diagnosis present

## 2023-09-18 DIAGNOSIS — R109 Unspecified abdominal pain: Secondary | ICD-10-CM | POA: Diagnosis present

## 2023-09-18 DIAGNOSIS — Z7984 Long term (current) use of oral hypoglycemic drugs: Secondary | ICD-10-CM | POA: Diagnosis not present

## 2023-09-18 LAB — GLUCOSE, CAPILLARY
Glucose-Capillary: 108 mg/dL — ABNORMAL HIGH (ref 70–99)
Glucose-Capillary: 136 mg/dL — ABNORMAL HIGH (ref 70–99)
Glucose-Capillary: 142 mg/dL — ABNORMAL HIGH (ref 70–99)
Glucose-Capillary: 165 mg/dL — ABNORMAL HIGH (ref 70–99)
Glucose-Capillary: 59 mg/dL — ABNORMAL LOW (ref 70–99)
Glucose-Capillary: 80 mg/dL (ref 70–99)
Glucose-Capillary: 83 mg/dL (ref 70–99)
Glucose-Capillary: 89 mg/dL (ref 70–99)
Glucose-Capillary: 99 mg/dL (ref 70–99)

## 2023-09-18 LAB — CBC
HCT: 31.4 % — ABNORMAL LOW (ref 39.0–52.0)
Hemoglobin: 11.1 g/dL — ABNORMAL LOW (ref 13.0–17.0)
MCH: 31.5 pg (ref 26.0–34.0)
MCHC: 35.4 g/dL (ref 30.0–36.0)
MCV: 89.2 fL (ref 80.0–100.0)
Platelets: 226 10*3/uL (ref 150–400)
RBC: 3.52 MIL/uL — ABNORMAL LOW (ref 4.22–5.81)
RDW: 13.5 % (ref 11.5–15.5)
WBC: 6.3 10*3/uL (ref 4.0–10.5)
nRBC: 0 % (ref 0.0–0.2)

## 2023-09-18 LAB — BASIC METABOLIC PANEL WITH GFR
Anion gap: 10 (ref 5–15)
BUN: 13 mg/dL (ref 8–23)
CO2: 25 mmol/L (ref 22–32)
Calcium: 9.2 mg/dL (ref 8.9–10.3)
Chloride: 107 mmol/L (ref 98–111)
Creatinine, Ser: 1.43 mg/dL — ABNORMAL HIGH (ref 0.61–1.24)
GFR, Estimated: 51 mL/min — ABNORMAL LOW (ref >60)
Glucose, Bld: 107 mg/dL — ABNORMAL HIGH (ref 70–99)
Potassium: 3.5 mmol/L (ref 3.5–5.1)
Sodium: 142 mmol/L (ref 135–145)

## 2023-09-18 MED ORDER — DEXTROSE 50 % IV SOLN: 1.0000 | INTRAVENOUS | Status: DC | PRN

## 2023-09-18 MED ORDER — ORAL CARE MOUTH RINSE: 15.0000 mL | OROMUCOSAL | Status: DC | PRN

## 2023-09-18 MED ORDER — CHLORHEXIDINE GLUCONATE CLOTH 2 % EX PADS
6.0000 | MEDICATED_PAD | Freq: Every day | CUTANEOUS | Status: DC
  Administered 2023-09-18: 6 via TOPICAL

## 2023-09-18 MED ORDER — DEXTROSE-SODIUM CHLORIDE 5-0.9 % IV SOLN
INTRAVENOUS | Status: DC
Start: 2023-09-18 — End: 2023-09-18

## 2023-09-18 NOTE — Progress Notes (Signed)
 PROGRESS NOTE    [name] [name]  BJY:[id number] DOB: Aug 05, 1945 DOA: 09/17/2023 PCP: Barbette Reichmann, MD  Chief Complaint  Patient presents with   Abdominal Pain   Dizziness    Hospital Course:  [name] [name] is 78 y.o. 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  Patient presents on this admission with concern for left lower extremity cellulitis and urinary retention.  Patient reports 3 days of abdominal pain, intermittent dizziness, and syncopal episodes.  In the ED labs are mostly unremarkable, hemoglobin 11.5, troponin 28.  CT head was nonacute.  CT abdomen pelvis revealed marked distended bladder with bilateral hydro ureter nephrosis and nonobstructing stones.  Catheter was placed in the ED and received a 3 L output with some blood clots.  Patient was also started on ceftriaxone for left leg cellulitis and given fentanyl for pain.  He was admitted for further workup  Subjective: On evaluation this morning patient is frustrated about his food options.  Has no acute medical complaints.   Objective: Vitals:   09/17/23 2242 09/18/23 0059 09/18/23 0410 09/18/23 0738  BP: (!) 168/84 (!) 143/70 (!) 152/84 124/71  Pulse: 76 64 75 81  Resp: 16 18 20 18   Temp: 98 F (36.7 C) 98.2 F (36.8 C) 98.4 F (36.9 C) 97.7 F (36.5 C)  TempSrc: Oral Oral Oral   SpO2: 100% 94% 95% 100%  Weight:      Height:        Intake/Output Summary (Last 24 hours) at 09/18/2023 0743 Last data filed at 09/18/2023 0600 Gross per 24 hour  Intake 366.45 ml  Output 7050 ml  Net -6683.55 ml   Filed Weights   09/17/23 1751  Weight: 108.9 kg    Examination: General exam: Appears calm and comfortable, NAD  Respiratory system: No work of breathing, symmetric chest wall expansion Cardiovascular system: S1 & S2 heard, RRR.  Gastrointestinal  system: Abdomen is nondistended, soft and nontender.  Neuro: Alert and oriented.  Does require prompting and repeating to answer questions. GU: Foley catheter in place with red tinted urine Extremities: Symmetric, expected ROM Skin: No rashes, lesions  Assessment & Plan:  Principal Problem:   Left leg cellulitis Active Problems:   Abdominal pain   Acute urinary retention   Bilateral hydronephrosis    Acute urinary retention Bilateral hydronephrosis and hydroureters - 3 L output immediately in ED - Maintain Foley catheter for at least 7 to 10 days for maximal urinary decompression - Urology consulted, will see the patient in the clinic for outpatient voiding trial - Continue Flomax - Did have gross hematuria. - Continue to monitor closely for postobstructive diuresis  Gross hematuria - Per urology likely secondary to obstructed ureter with rapid decompression.  It is resolving without intervention. -Continue to monitor closely - Trend hemoglobin, currently stable - Patient is taking Aggrenox outpatient will resume once hematuria clears  AKI - Baseline creatinine 0.8, 1.72 on arrival - Downtrending now - Likely postrenal from urinary retention, expect further resolution as retention improved  Left leg cellulitis Left lower extremity swelling - Doppler negative for DVT - Improving - On ceftriaxone, continue for now. Keflex at DC  Chronic pain syndrome - Resume home dose oxycodone with additional PRNs  NOT currently on anticoagulation - Initially it was reported that patient was on apixaban.  Patient has only taken 2 weeks of this  medication and stopped taking over a month ago.  Type 2 diabetes with complication, without long-term use of insulin Acquired polyneuropathy - Uncontrolled diabetes, hemoglobin A1c 8.4% - Hold oral antihyperglycemics for now - Continue sliding scale/basal/bolus and titrate up as tolerated  CAD in native artery - Status post CABG 2015 -  Resume home meds - No chest pain currently.  Troponin negative x 2  Sigmoid diverticulosis - No diverticulitis - Incidentally seen on CT - Continue bowel regimen  Heart failure preserved EF - Avoid additional IV fluids - Monitor closely - Resume home meds  History of CVA - On Aggrenox - Continue with home meds for now  DVT prophylaxis: SCDs   Code Status: Full Code Family Communication:  Discussed directly with patient, and his wife Okey Dupre on the phone. Disposition: Inpatient, still hospitalized for AKI and hematuria monitoring.  If creatinine resolves to baseline and hematuria begins to clear we will plan for discharge home tomorrow   Consultants:  Treatment Team:  Consulting Physician: Vanna Scotland, MD  Procedures:    Antimicrobials:  Anti-infectives (From admission, onward)    Start     Dose/Rate Route Frequency Ordered Stop   09/18/23 2000  cefTRIAXone (ROCEPHIN) 2 g in sodium chloride 0.9 % 100 mL IVPB        2 g 200 mL/hr over 30 Minutes Intravenous Every 24 hours 09/17/23 2308     09/17/23 2115  cefTRIAXone (ROCEPHIN) 2 g in sodium chloride 0.9 % 100 mL IVPB        2 g 200 mL/hr over 30 Minutes Intravenous  Once 09/17/23 2108 09/17/23 2250       Data Reviewed: I have personally reviewed following labs and imaging studies CBC: Recent Labs  Lab 09/17/23 1753 09/18/23 0459  WBC 5.4 6.3  HGB 11.5* 11.1*  HCT 33.9* 31.4*  MCV 92.1 89.2  PLT 242 226   Basic Metabolic Panel: Recent Labs  Lab 09/17/23 1753 09/18/23 0459  NA 140 142  K 4.0 3.5  CL 107 107  CO2 28 25  GLUCOSE 89 107*  BUN 14 13  CREATININE 1.72* 1.43*  CALCIUM 9.7 9.2   GFR: Estimated Creatinine Clearance: 54.3 mL/min (A) (by C-G formula based on SCr of 1.43 mg/dL (H)). Liver Function Tests: Recent Labs  Lab 09/17/23 1753  AST 14*  ALT 10  ALKPHOS 68  BILITOT 0.5  PROT 6.8  ALBUMIN 3.7   CBG: Recent Labs  Lab 09/18/23 0020 09/18/23 0102 09/18/23 0212 09/18/23 0248  09/18/23 0402  GLUCAP 59* 89 142* 165* 99    No results found for this or any previous visit (from the past 240 hours).   Radiology Studies: US Venous Img Lower Unilateral Left Result Date: 09/17/2023 CLINICAL DATA:  Left leg swelling EXAM: LEFT LOWER EXTREMITY VENOUS DOPPLER ULTRASOUND TECHNIQUE: Gray-scale sonography with compression, as well as color and duplex ultrasound, were performed to evaluate the deep venous system(s) from the level of the common femoral vein through the popliteal and proximal calf veins. COMPARISON:  None Available. FINDINGS: VENOUS Normal compressibility of the common femoral, superficial femoral, and popliteal veins, as well as the visualized calf veins. Visualized portions of profunda femoral vein and great saphenous vein unremarkable. No filling defects to suggest DVT on grayscale or color Doppler imaging. Doppler waveforms show normal direction of venous flow, normal respiratory plasticity and response to augmentation. Limited views of the contralateral common femoral vein are unremarkable. OTHER None. Limitations: none IMPRESSION: Negative. Electronically Signed   By:  Janeece Mechanic M.D.   On: 09/17/2023 23:23   CT ABDOMEN PELVIS W CONTRAST Result Date: 09/17/2023 CLINICAL DATA:  Abdominal pain, acute, nonlocalized. Dizziness, abdominal pain EXAM: CT ABDOMEN AND PELVIS WITH CONTRAST TECHNIQUE: Multidetector CT imaging of the abdomen and pelvis was performed using the standard protocol following bolus administration of intravenous contrast. RADIATION DOSE REDUCTION: This exam was performed according to the departmental dose-optimization program which includes automated exposure control, adjustment of the mA and/or kV according to patient size and/or use of iterative reconstruction technique. CONTRAST:  80mL OMNIPAQUE IOHEXOL 300 MG/ML  SOLN COMPARISON:  07/10/2015 FINDINGS: Lower chest: No acute abnormality. Coronary artery disease status post CABG. Aortic atherosclerosis.  Hepatobiliary: No focal liver abnormality is seen. Status post cholecystectomy. No biliary dilatation. Pancreas: Atrophy.  No focal abnormality or ductal dilatation. Spleen: Calcifications throughout the spleen compatible with old granulomatous disease. Normal size. Adrenals/Urinary Tract: Moderate bilateral hydronephrosis and hydroureter to the level of the bladder. No obstructing stones. Urinary bladder is distended above the level of the umbilicus. No renal or adrenal mass. Stomach/Bowel: Sigmoid diverticulosis. No active diverticulitis. Moderate stool burden throughout the colon. Normal appendix. Stomach and small bowel decompressed. No bowel obstruction or inflammatory process. Vascular/Lymphatic: Diffuse aortoiliac atherosclerosis. No evidence of aneurysm or adenopathy. Reproductive: Difficult to visualize due to beam hardening artifact from left hip replacement. Other: No free fluid or free air. Musculoskeletal: Prior left hip replacement. Degenerative changes in the lumbar spine. No acute bony abnormality. IMPRESSION: Markedly distended bladder above the level of the umbilicus with moderate bilateral hydroureteronephrosis. No obstructing stones visualized. Sigmoid diverticulosis. Aortic atherosclerosis. Electronically Signed   By: Janeece Mechanic M.D.   On: 09/17/2023 21:29   CT HEAD WO CONTRAST ( ) Result Date: 09/17/2023 CLINICAL DATA:  Head trauma, dizziness EXAM: CT HEAD WITHOUT CONTRAST TECHNIQUE: Contiguous axial images were obtained from the base of the skull through the vertex without intravenous contrast. RADIATION DOSE REDUCTION: This exam was performed according to the departmental dose-optimization program which includes automated exposure control, adjustment of the mA and/or kV according to patient size and/or use of iterative reconstruction technique. COMPARISON:  None Available. FINDINGS: Brain: No acute intracranial hemorrhage. No CT evidence of acute infarct. Nonspecific hypoattenuation in  the periventricular and subcortical white matter favored to reflect chronic microvascular ischemic changes. Generalized parenchymal volume loss. No edema, mass effect, or midline shift. The basilar cisterns are patent. Ventricles: Prominence of the ventricles suggesting underlying parenchymal volume loss. Vascular: No hyperdense vessel or unexpected calcification. Skull: No acute or aggressive finding. Orbits: Orbits are symmetric. Sinuses: Scattered mucosal thickening throughout the bilateral ethmoid sinuses and mucosal thickening in the alveolar recess of the right maxillary sinus. Other: Mastoid air cells are clear. IMPRESSION: No CT evidence of acute intracranial abnormality. Similar chronic microvascular ischemic changes and mild parenchymal volume loss. Electronically Signed   By: Denny Flack M.D.   On: 09/17/2023 20:03    Scheduled Meds:  gabapentin  300 mg Oral Daily   And   gabapentin  600 mg Oral QHS   insulin aspart  0-15 Units Subcutaneous TID WC   insulin aspart  0-5 Units Subcutaneous QHS   tamsulosin  0.4 mg Oral Daily   Continuous Infusions:  cefTRIAXone (ROCEPHIN)  IV     dextrose 5 % and 0.9 % NaCl Stopped (09/18/23 0400)     LOS: 0 days  MDM: Patient is high risk for one or more organ failure.  They necessitate ongoing hospitalization for continued IV therapies and subsequent  lab monitoring. Total time spent interpreting labs and vitals, coordinating care amongst consultants and care team members, directly assessing and discussing care with the patient and/or family: 55 min    Tameem Pullara, DO [facility]  To contact the attending physician between 7A-7P please use Epic Chat. To contact the covering physician during after hours 7P-7A, please review Amion.   09/18/2023, 7:43 AM   *This document has been created with the assistance of dictation software. Please excuse typographical errors. *

## 2023-09-18 NOTE — Plan of Care (Signed)
  Problem: Activity: Goal: Risk for activity intolerance will decrease Outcome: Progressing   Problem: Pain Managment: Goal: General experience of comfort will improve and/or be controlled Outcome: Progressing

## 2023-09-18 NOTE — Plan of Care (Signed)

## 2023-09-18 NOTE — Plan of Care (Signed)
  Problem: Pain Managment: Goal: General experience of comfort will improve and/or be controlled Outcome: Progressing   Problem: Safety: Goal: Ability to remain free from injury will improve Outcome: Progressing   Problem: Skin Integrity: Goal: Risk for impaired skin integrity will decrease Outcome: Progressing   Problem: Health Behavior/Discharge Planning: Goal: Ability to identify and utilize available resources and services will improve Outcome: Progressing

## 2023-09-18 NOTE — Plan of Care (Signed)
       CROSS COVER NOTE  NAME: [name] [name] MRN: [id number] DOB : November 09, 1945    Concern as stated by nurse / staff   just checked patient's blood sugar and it is 59. asymptomatic. he is currently NPO for urology consult in AM. how would you like me to treat the hypoglycemia? no order for any meds      Pertinent findings on chart review:   Assessment and  Interventions   Assessment:  Hypoglycemia  Plan: D5NS and prn D50 while npo for procedure X X
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
## 2023-09-19 ENCOUNTER — Other Ambulatory Visit: Payer: Self-pay

## 2023-09-19 DIAGNOSIS — R1030 Lower abdominal pain, unspecified: Secondary | ICD-10-CM | POA: Diagnosis not present

## 2023-09-19 DIAGNOSIS — L03116 Cellulitis of left lower limb: Secondary | ICD-10-CM | POA: Diagnosis not present

## 2023-09-19 DIAGNOSIS — N179 Acute kidney failure, unspecified: Secondary | ICD-10-CM | POA: Diagnosis not present

## 2023-09-19 DIAGNOSIS — R338 Other retention of urine: Secondary | ICD-10-CM | POA: Diagnosis not present

## 2023-09-19 LAB — CBC WITH DIFFERENTIAL/PLATELET
Abs Immature Granulocytes: 0.02 10*3/uL (ref 0.00–0.07)
Basophils Absolute: 0 10*3/uL (ref 0.0–0.1)
Basophils Relative: 0 %
Eosinophils Absolute: 0.2 10*3/uL (ref 0.0–0.5)
Eosinophils Relative: 4 %
HCT: 31.5 % — ABNORMAL LOW (ref 39.0–52.0)
Hemoglobin: 11 g/dL — ABNORMAL LOW (ref 13.0–17.0)
Immature Granulocytes: 0 %
Lymphocytes Relative: 39 %
Lymphs Abs: 2.6 10*3/uL (ref 0.7–4.0)
MCH: 31.2 pg (ref 26.0–34.0)
MCHC: 34.9 g/dL (ref 30.0–36.0)
MCV: 89.2 fL (ref 80.0–100.0)
Monocytes Absolute: 0.8 10*3/uL (ref 0.1–1.0)
Monocytes Relative: 11 %
Neutro Abs: 3.1 10*3/uL (ref 1.7–7.7)
Neutrophils Relative %: 46 %
Platelets: 198 10*3/uL (ref 150–400)
RBC: 3.53 MIL/uL — ABNORMAL LOW (ref 4.22–5.81)
RDW: 13.3 % (ref 11.5–15.5)
WBC: 6.7 10*3/uL (ref 4.0–10.5)
nRBC: 0 % (ref 0.0–0.2)

## 2023-09-19 LAB — COMPREHENSIVE METABOLIC PANEL WITH GFR
ALT: 10 U/L (ref 0–44)
AST: 12 U/L — ABNORMAL LOW (ref 15–41)
Albumin: 3 g/dL — ABNORMAL LOW (ref 3.5–5.0)
Alkaline Phosphatase: 58 U/L (ref 38–126)
Anion gap: 7 (ref 5–15)
BUN: 11 mg/dL (ref 8–23)
CO2: 27 mmol/L (ref 22–32)
Calcium: 9 mg/dL (ref 8.9–10.3)
Chloride: 104 mmol/L (ref 98–111)
Creatinine, Ser: 1.23 mg/dL (ref 0.61–1.24)
GFR, Estimated: >60 mL/min (ref >60)
Glucose, Bld: 95 mg/dL (ref 70–99)
Potassium: 2.8 mmol/L — ABNORMAL LOW (ref 3.5–5.1)
Sodium: 138 mmol/L (ref 135–145)
Total Bilirubin: 0.7 mg/dL (ref 0.0–1.2)
Total Protein: 6 g/dL — ABNORMAL LOW (ref 6.5–8.1)

## 2023-09-19 LAB — GLUCOSE, CAPILLARY
Glucose-Capillary: 90 mg/dL (ref 70–99)
Glucose-Capillary: 93 mg/dL (ref 70–99)

## 2023-09-19 MED ORDER — POTASSIUM CHLORIDE CRYS ER 20 MEQ PO TBCR
40.0000 meq | EXTENDED_RELEASE_TABLET | ORAL | Status: DC
  Administered 2023-09-19 (×2): 40 meq via ORAL
  Filled 2023-09-19 (×2): qty 2

## 2023-09-19 MED ORDER — CEPHALEXIN 500 MG PO CAPS
500.0000 mg | ORAL_CAPSULE | Freq: Two times a day (BID) | ORAL | 0 refills | Status: AC
  Filled 2023-09-19: qty 6, 3d supply, fill #0

## 2023-09-19 NOTE — Progress Notes (Signed)
  Progress Note   Date: 09/19/2023  Patient Name: [name] [name]        MRN#: [id number]  Review the patient's clinical findings supports the diagnosis of:   Obesity class II

## 2023-09-19 NOTE — Progress Notes (Signed)
 Mobility Specialist - Progress Note     09/19/23 1205  Mobility  Activity Ambulated with assistance in hallway;Stood at bedside  Level of Assistance Contact guard assist, steadying assist  Assistive Device Four point cane (plus HHA)  Distance Ambulated (ft) 170 ft  Range of Motion/Exercises Active  Activity Response Tolerated well  Mobility Referral Yes  Mobility visit 1 Mobility  Mobility Specialist Start Time (ACUTE ONLY) 1146  Mobility Specialist Stop Time (ACUTE ONLY) 1200  Mobility Specialist Time Calculation (min) (ACUTE ONLY) 14 min   Pt walking with nursing in hallway upon entry. Pt socks donned and pt STS and ambulates to hallway around NS for 1 lap with MS (taken over from Nursing). Pt given 4 point cane to ambulate with after walker refusal. Pt returned to bed to set EOB with needs in reach and bed alarm activated.   Guido Rumble Mobility Specialist 09/19/23, 12:08 PM

## 2023-09-19 NOTE — Progress Notes (Addendum)
 Patient is alert and oriented X 2. Pt is discharging home with foley's catheter. Gave education about it.

## 2023-09-19 NOTE — Discharge Summary (Signed)
 Physician Discharge Summary   Patient: [name] [name] MRN: [id number] DOB: 1945-07-16  Admit date:     09/17/2023  Discharge date: 09/19/23  Discharge Physician: Roise Cleaver   PCP: Antonio Baumgarten, MD   Recommendations at discharge:   Follow-up with urology in 1 week  Discharge Diagnoses: Principal Problem:   Left leg cellulitis Active Problems:   Abdominal pain   Acute urinary retention   Bilateral hydronephrosis   AKI (acute kidney injury) (HCC)  Resolved Problems:   * No resolved hospital problems. *  Hospital Course: [name] [name] is 78 y.o. 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  Patient presents on this admission with concern for left lower extremity cellulitis and urinary retention.  Patient reports 3 days of abdominal pain, intermittent dizziness, and syncopal episodes.  In the ED labs are mostly unremarkable, hemoglobin 11.5, troponin 28.  CT head was nonacute.  CT abdomen pelvis revealed marked distended bladder with bilateral hydro ureter nephrosis and nonobstructing stones.  Catheter was placed in the ED and received a 3 L output with some blood clots.  Patient was also started on ceftriaxone  for left leg cellulitis and given fentanyl  for pain.  He was admitted for further workup.  He was evaluated by urology who recommends Foley catheter for 7 to 10 days of urinary decompression.  Source of hematuria thought to be secondary to obstructed ureter with rapid decompression.  It is resolving without intervention.  Stay was further complicated by AKI up to 1.72.  He was monitored closely for postobstructive diuresis and AKI gradually resolved. He was started on ceftriaxone  for his left leg cellulitis which improved quickly.  He will be discharging home with Keflex  for a total of 5 days. Discharging home with  his Foley catheter with an appointment to follow-up in the urology office next week.   Acute urinary retention Bilateral hydronephrosis and hydroureters - 3 L output immediately in ED - Maintain Foley catheter for at least 7 to 10 days for maximal urinary decompression - Urology consulted, will see the patient in the clinic for outpatient voiding trial - Continue Flomax    Gross hematuria - Per urology likely secondary to obstructed ureter with rapid decompression.  It is resolving without intervention. - Trend hemoglobin, currently stable - Patient is taking Aggrenox  outpatient will resume in two days to give additional time for hematuria clearance   AKI - Baseline creatinine 0.8, 1.72 on arrival - Downtrending now - Likely postrenal from urinary retention, expect further resolution as retention improved   Left leg cellulitis Left lower extremity swelling - Doppler negative for DVT - Improved on ceftriaxone . Keflex  at DC   Chronic pain syndrome - Resume home dose oxycodone  with additional PRNs   NOT currently on anticoagulation - Initially it was reported that patient was on apixaban .  Patient has only taken 2 weeks of this medication and stopped taking over a month ago.   Type 2 diabetes with complication, without long-term use of insulin  Acquired polyneuropathy - Uncontrolled diabetes, hemoglobin A1c 8.4% - Resume home meds at DC -- PCP follow up   CAD in native artery - Status post CABG 2015 - Resume home meds - No chest pain currently.  Troponin negative x 2   Sigmoid diverticulosis - No diverticulitis - Incidentally seen on CT - Continue bowel regimen   Heart failure preserved EF -  Avoid additional IV fluids - Monitor closely - Resume home meds   History of CVA - On Aggrenox  - Continue with home meds for now         Consultants: Urology, Dr. Ace Holder Procedures performed: n/a  Disposition: Home Diet recommendation:  Discharge Diet Orders (From  admission, onward)     Start     Ordered   09/19/23 0000  Diet general        09/19/23 1300           Regular diet  Discharge Instructions     Ambulatory referral to Urology   Complete by: As directed    1 week hospital follow up   Call MD for:  difficulty breathing, headache or visual disturbances   Complete by: As directed    Call MD for:  persistant dizziness or light-headedness   Complete by: As directed    Call MD for:  persistant nausea and vomiting   Complete by: As directed    Call MD for:  severe uncontrolled pain   Complete by: As directed    Call MD for:  temperature >100.4   Complete by: As directed    Diet general   Complete by: As directed    Discharge instructions   Complete by: As directed    Follow up with Urology in one week   Increase activity slowly   Complete by: As directed        DISCHARGE MEDICATION: Allergies as of 09/19/2023       Reactions   Parafon Forte Dsc [chlorzoxazone] Itching, Rash   Statins Other (See Comments)   Muscle pain   Augmentin [amoxicillin-pot Clavulanate] Diarrhea   Gave him cdiff   Midodrine  Hcl    Stomach and bowel distress   Cucumber Extract Other (See Comments)   GI upset   Plavix  [clopidogrel  Bisulfate] Rash        Medication List     PAUSE taking these medications    dipyridamole -aspirin  200-25 MG 12hr capsule Wait to take this until: September 21, 2023 Commonly known as: AGGRENOX  Take 1 capsule by mouth 2 (two) times daily.       STOP taking these medications    apixaban  2.5 MG Tabs tablet Commonly known as: ELIQUIS        TAKE these medications    cephALEXin  500 MG capsule Commonly known as: KEFLEX  Take 1 capsule (500 mg total) by mouth 2 times daily at 12 noon and 4 pm for 3 days.   cyclobenzaprine  10 MG tablet Commonly known as: FLEXERIL  Take 1-2 tablets (10-20 mg total) by mouth See admin instructions. 10 mg in the morning, 20 mg in the evening   dicyclomine  10 MG  capsule Commonly known as: BENTYL  Take 1 capsule (10 mg total) by mouth 2 (two) times daily.   docusate sodium  100 MG capsule Commonly known as: COLACE Take 100 mg by mouth every evening.   gabapentin  300 MG capsule Commonly known as: NEURONTIN  Take 1-2 capsules (300-600 mg total) by mouth See admin instructions. 300 mg in the morning, 600 mg in the evening   glimepiride  4 MG tablet Commonly known as: AMARYL  Take 4 mg by mouth every evening.   ondansetron  4 MG tablet Commonly known as: ZOFRAN  Take 1 tablet (4 mg total) by mouth every 6 (six) hours as needed for nausea.   oxyCODONE  5 MG immediate release tablet Commonly known as: Oxy IR/ROXICODONE  Take 1-2 tablets (5-10 mg total) by mouth every 4 (four) hours as  needed for moderate pain (pain score 4-6). What changed: Another medication with the same name was removed. Continue taking this medication, and follow the directions you see here.   pantoprazole  40 MG tablet Commonly known as: PROTONIX  Take 1 tablet (40 mg total) by mouth every morning.   Repatha SureClick 140 MG/ML Soaj Generic drug: Evolocumab Inject 140 mg into the skin every 14 (fourteen) days.   Semaglutide  (1 MG/DOSE) 4 MG/3ML Sopn Inject 1 mg into the skin every 7 (seven) days.   tamsulosin  0.4 MG Caps capsule Commonly known as: FLOMAX  Take 0.4 mg by mouth daily.        Follow-up Information     Antonio Baumgarten, MD Follow up.   Specialty: Internal Medicine Why: Hospital follow up Contact information: [address] [place] [address] [phone]                Discharge Exam: Cleavon Curls Weights   09/17/23 1751  Weight: 108.9 kg   Constitutional:  Normal appearance. Non toxic-appearing.  HENT: Head Normocephalic and atraumatic.  Mucous membranes are moist.  Eyes:  Extraocular intact. Conjunctivae normal. Pupils are equal, round, and reactive to light.  Cardiovascular: Rate and Rhythm: Normal rate and  regular rhythm.  Pulmonary: Non labored, symmetric rise of chest wall.  GU: Pink-tinged urine in Foley bag Musculoskeletal:  Normal range of motion.  Skin: warm and dry. not jaundiced.  Neurological: No focal deficit present. alert. Oriented. Psychiatric: Mood and Affect congruent.    Condition at discharge: stable  The results of significant diagnostics from this hospitalization (including imaging, microbiology, ancillary and laboratory) are listed below for reference.   Imaging Studies: US  Venous Img Lower Unilateral Left Result Date: 09/17/2023 CLINICAL DATA:  Left leg swelling EXAM: LEFT LOWER EXTREMITY VENOUS DOPPLER ULTRASOUND TECHNIQUE: Gray-scale sonography with compression, as well as color and duplex ultrasound, were performed to evaluate the deep venous system(s) from the level of the common femoral vein through the popliteal and proximal calf veins. COMPARISON:  None Available. FINDINGS: VENOUS Normal compressibility of the common femoral, superficial femoral, and popliteal veins, as well as the visualized calf veins. Visualized portions of profunda femoral vein and great saphenous vein unremarkable. No filling defects to suggest DVT on grayscale or color Doppler imaging. Doppler waveforms show normal direction of venous flow, normal respiratory plasticity and response to augmentation. Limited views of the contralateral common femoral vein are unremarkable. OTHER None. Limitations: none IMPRESSION: Negative. Electronically Signed   By: Janeece Mechanic M.D.   On: 09/17/2023 23:23   CT ABDOMEN PELVIS W CONTRAST Result Date: 09/17/2023 CLINICAL DATA:  Abdominal pain, acute, nonlocalized. Dizziness, abdominal pain EXAM: CT ABDOMEN AND PELVIS WITH CONTRAST TECHNIQUE: Multidetector CT imaging of the abdomen and pelvis was performed using the standard protocol following bolus administration of intravenous contrast. RADIATION DOSE REDUCTION: This exam was performed according to the departmental  dose-optimization program which includes automated exposure control, adjustment of the mA and/or kV according to patient size and/or use of iterative reconstruction technique. CONTRAST:  80mL OMNIPAQUE  IOHEXOL  300 MG/ML  SOLN COMPARISON:  07/10/2015 FINDINGS: Lower chest: No acute abnormality. Coronary artery disease status post CABG. Aortic atherosclerosis. Hepatobiliary: No focal liver abnormality is seen. Status post cholecystectomy. No biliary dilatation. Pancreas: Atrophy.  No focal abnormality or ductal dilatation. Spleen: Calcifications throughout the spleen compatible with old granulomatous disease. Normal size. Adrenals/Urinary Tract: Moderate bilateral hydronephrosis and hydroureter to the level of the bladder. No obstructing stones. Urinary bladder is distended  above the level of the umbilicus. No renal or adrenal mass. Stomach/Bowel: Sigmoid diverticulosis. No active diverticulitis. Moderate stool burden throughout the colon. Normal appendix. Stomach and small bowel decompressed. No bowel obstruction or inflammatory process. Vascular/Lymphatic: Diffuse aortoiliac atherosclerosis. No evidence of aneurysm or adenopathy. Reproductive: Difficult to visualize due to beam hardening artifact from left hip replacement. Other: No free fluid or free air. Musculoskeletal: Prior left hip replacement. Degenerative changes in the lumbar spine. No acute bony abnormality. IMPRESSION: Markedly distended bladder above the level of the umbilicus with moderate bilateral hydroureteronephrosis. No obstructing stones visualized. Sigmoid diverticulosis. Aortic atherosclerosis. Electronically Signed   By: Janeece Mechanic M.D.   On: 09/17/2023 21:29   CT HEAD WO CONTRAST ( ) Result Date: 09/17/2023 CLINICAL DATA:  Head trauma, dizziness EXAM: CT HEAD WITHOUT CONTRAST TECHNIQUE: Contiguous axial images were obtained from the base of the skull through the vertex without intravenous contrast. RADIATION DOSE REDUCTION: This exam  was performed according to the departmental dose-optimization program which includes automated exposure control, adjustment of the mA and/or kV according to patient size and/or use of iterative reconstruction technique. COMPARISON:  None Available. FINDINGS: Brain: No acute intracranial hemorrhage. No CT evidence of acute infarct. Nonspecific hypoattenuation in the periventricular and subcortical white matter favored to reflect chronic microvascular ischemic changes. Generalized parenchymal volume loss. No edema, mass effect, or midline shift. The basilar cisterns are patent. Ventricles: Prominence of the ventricles suggesting underlying parenchymal volume loss. Vascular: No hyperdense vessel or unexpected calcification. Skull: No acute or aggressive finding. Orbits: Orbits are symmetric. Sinuses: Scattered mucosal thickening throughout the bilateral ethmoid sinuses and mucosal thickening in the alveolar recess of the right maxillary sinus. Other: Mastoid air cells are clear. IMPRESSION: No CT evidence of acute intracranial abnormality. Similar chronic microvascular ischemic changes and mild parenchymal volume loss. Electronically Signed   By: Denny Flack M.D.   On: 09/17/2023 20:03    Microbiology: Results for orders placed or performed during the hospital encounter of 07/23/23  Surgical pcr screen     Status: None   Collection Time: 07/23/23  9:24 AM   Specimen: Nasal Mucosa; Nasal Swab  Result Value Ref Range Status   MRSA, PCR NEGATIVE NEGATIVE Final   Staphylococcus aureus NEGATIVE NEGATIVE Final    Comment: (NOTE) The Xpert SA Assay (FDA approved for NASAL specimens in patients 56 years of age and older), is one component of a comprehensive surveillance program. It is not intended to diagnose infection nor to guide or monitor treatment. Performed at [facility], [address] [place], [place] [address]     Labs: CBC: Recent Labs  Lab 09/17/23 1753 09/18/23 0459  09/19/23 0853  WBC 5.4 6.3 6.7  NEUTROABS  --   --  3.1  HGB 11.5* 11.1* 11.0*  HCT 33.9* 31.4* 31.5*  MCV 92.1 89.2 89.2  PLT 242 226 198   Basic Metabolic Panel: Recent Labs  Lab 09/17/23 1753 09/18/23 0459 09/19/23 0853  NA 140 142 138  K 4.0 3.5 2.8*  CL 107 107 104  CO2 28 25 27   GLUCOSE 89 107* 95  BUN 14 13 11   CREATININE 1.72* 1.43* 1.23  CALCIUM 9.7 9.2 9.0   Liver Function Tests: Recent Labs  Lab 09/17/23 1753 09/19/23 0853  AST 14* 12*  ALT 10 10  ALKPHOS 68 58  BILITOT 0.5 0.7  PROT 6.8 6.0*  ALBUMIN 3.7 3.0*   CBG: Recent Labs  Lab 09/18/23 1110 09/18/23 1707 09/18/23 2019 09/19/23 0812 09/19/23  1201  GLUCAP 80 108* 136* 90 93    Discharge time spent: 31 minutes.  Signed: Algernon Mundie, DO [facility] 09/19/2023

## 2023-09-19 NOTE — Hospital Course (Addendum)
 [name] [name] is 78 y.o. 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  Patient presents on this admission with concern for left lower extremity cellulitis and urinary retention.  Patient reports 3 days of abdominal pain, intermittent dizziness, and syncopal episodes.  In the ED labs are mostly unremarkable, hemoglobin 11.5, troponin 28.  CT head was nonacute.  CT abdomen pelvis revealed marked distended bladder with bilateral hydro ureter nephrosis and nonobstructing stones.  Catheter was placed in the ED and received a 3 L output with some blood clots.  Patient was also started on ceftriaxone  for left leg cellulitis and given fentanyl  for pain.  He was admitted for further workup.  He was evaluated by urology who recommends Foley catheter for 7 to 10 days of urinary decompression.  Source of hematuria thought to be secondary to obstructed ureter with rapid decompression.  It is resolving without intervention.  Stay was further complicated by AKI up to 1.72.  He was monitored closely for postobstructive diuresis and AKI gradually resolved. He was started on ceftriaxone  for his left leg cellulitis which improved quickly.  He will be discharging home with Keflex  for a total of 5 days. Discharging home with his Foley catheter with an appointment to follow-up in the urology office next week.   Acute urinary retention Bilateral hydronephrosis and hydroureters - 3 L output immediately in ED - Maintain Foley catheter for at least 7 to 10 days for maximal urinary decompression - Urology consulted, will see the patient in the clinic for outpatient voiding trial - Continue Flomax    Gross hematuria - Per urology likely secondary to obstructed ureter with rapid decompression.  It is resolving without intervention. - Trend hemoglobin, currently stable -  Patient is taking Aggrenox  outpatient will resume in two days to give additional time for hematuria clearance   AKI - Baseline creatinine 0.8, 1.72 on arrival - Downtrending now - Likely postrenal from urinary retention, expect further resolution as retention improved   Left leg cellulitis Left lower extremity swelling - Doppler negative for DVT - Improved on ceftriaxone . Keflex  at DC   Chronic pain syndrome - Resume home dose oxycodone  with additional PRNs   NOT currently on anticoagulation - Initially it was reported that patient was on apixaban .  Patient has only taken 2 weeks of this medication and stopped taking over a month ago.   Type 2 diabetes with complication, without long-term use of insulin  Acquired polyneuropathy - Uncontrolled diabetes, hemoglobin A1c 8.4% - Resume home meds at DC -- PCP follow up   CAD in native artery - Status post CABG 2015 - Resume home meds - No chest pain currently.  Troponin negative x 2   Sigmoid diverticulosis - No diverticulitis - Incidentally seen on CT - Continue bowel regimen   Heart failure preserved EF - Avoid additional IV fluids - Monitor closely - Resume home meds   History of CVA - On Aggrenox  - Continue with home meds for now

## 2023-09-25 ENCOUNTER — Emergency Department
Admission: EM | Admit: 2023-09-25 | Discharge: 2023-09-26 | Disposition: A | Discharge status: Home / Self Care | Attending: Emergency Medicine | Admitting: Emergency Medicine

## 2023-09-25 ENCOUNTER — Other Ambulatory Visit: Payer: Self-pay

## 2023-09-25 DIAGNOSIS — R110 Nausea: Secondary | ICD-10-CM | POA: Insufficient documentation

## 2023-09-25 DIAGNOSIS — I509 Heart failure, unspecified: Secondary | ICD-10-CM | POA: Diagnosis not present

## 2023-09-25 DIAGNOSIS — K8689 Other specified diseases of pancreas: Secondary | ICD-10-CM | POA: Insufficient documentation

## 2023-09-25 DIAGNOSIS — K5730 Diverticulosis of large intestine without perforation or abscess without bleeding: Secondary | ICD-10-CM | POA: Insufficient documentation

## 2023-09-25 DIAGNOSIS — E871 Hypo-osmolality and hyponatremia: Secondary | ICD-10-CM | POA: Insufficient documentation

## 2023-09-25 DIAGNOSIS — E119 Type 2 diabetes mellitus without complications: Secondary | ICD-10-CM | POA: Diagnosis not present

## 2023-09-25 DIAGNOSIS — I110 Hypertensive heart disease with heart failure: Secondary | ICD-10-CM | POA: Diagnosis not present

## 2023-09-25 DIAGNOSIS — R531 Weakness: Secondary | ICD-10-CM | POA: Diagnosis present

## 2023-09-25 LAB — CBC
HCT: 30.4 % — ABNORMAL LOW (ref 39.0–52.0)
Hemoglobin: 10.6 g/dL — ABNORMAL LOW (ref 13.0–17.0)
MCH: 31.1 pg (ref 26.0–34.0)
MCHC: 34.9 g/dL (ref 30.0–36.0)
MCV: 89.1 fL (ref 80.0–100.0)
Platelets: 238 10*3/uL (ref 150–400)
RBC: 3.41 MIL/uL — ABNORMAL LOW (ref 4.22–5.81)
RDW: 12.9 % (ref 11.5–15.5)
WBC: 7.4 10*3/uL (ref 4.0–10.5)
nRBC: 0 % (ref 0.0–0.2)

## 2023-09-25 LAB — COMPREHENSIVE METABOLIC PANEL WITH GFR
ALT: 10 U/L (ref 0–44)
AST: 17 U/L (ref 15–41)
Albumin: 3.3 g/dL — ABNORMAL LOW (ref 3.5–5.0)
Alkaline Phosphatase: 60 U/L (ref 38–126)
Anion gap: 8 (ref 5–15)
BUN: 11 mg/dL (ref 8–23)
CO2: 25 mmol/L (ref 22–32)
Calcium: 9.2 mg/dL (ref 8.9–10.3)
Chloride: 100 mmol/L (ref 98–111)
Creatinine, Ser: 0.81 mg/dL (ref 0.61–1.24)
GFR, Estimated: >60 mL/min (ref >60)
Glucose, Bld: 70 mg/dL (ref 70–99)
Potassium: 3.8 mmol/L (ref 3.5–5.1)
Sodium: 133 mmol/L — ABNORMAL LOW (ref 135–145)
Total Bilirubin: 0.6 mg/dL (ref 0.0–1.2)
Total Protein: 6.5 g/dL (ref 6.5–8.1)

## 2023-09-25 LAB — URINALYSIS, COMPLETE (UACMP) WITH MICROSCOPIC
Bacteria, UA: NONE SEEN
Bilirubin Urine: NEGATIVE
Glucose, UA: NEGATIVE mg/dL
Hgb urine dipstick: NEGATIVE
Ketones, ur: NEGATIVE mg/dL
Leukocytes,Ua: NEGATIVE
Nitrite: NEGATIVE
Protein, ur: NEGATIVE mg/dL
Specific Gravity, Urine: 1.006 (ref 1.005–1.030)
Squamous Epithelial / HPF: 0 /HPF (ref 0–5)
pH: 8 (ref 5.0–8.0)

## 2023-09-25 LAB — TROPONIN I (HIGH SENSITIVITY): Troponin I (High Sensitivity): 16 ng/L (ref <18)

## 2023-09-25 MED ORDER — SODIUM CHLORIDE 0.9 % IV BOLUS
1000.0000 mL | Freq: Once | INTRAVENOUS | Status: AC
  Administered 2023-09-25: 1000 mL via INTRAVENOUS

## 2023-09-25 MED ORDER — ONDANSETRON HCL 4 MG/2ML IJ SOLN
4.0000 mg | Freq: Once | INTRAMUSCULAR | Status: AC
  Administered 2023-09-25: 4 mg via INTRAVENOUS
  Filled 2023-09-25: qty 2

## 2023-09-25 MED ORDER — ONDANSETRON 4 MG PO TBDP: 4.0000 mg | ORAL_TABLET | Freq: Three times a day (TID) | ORAL | 0 refills | Status: DC | PRN

## 2023-09-25 NOTE — ED Provider Notes (Signed)
 Emergency Medicine Observation Re-evaluation Note  [name] [name] is a 78 y.o. male, seen on rounds today.  Pt initially presented to the ED for complaints of Weakness  Plan  Current plan is for follow-up on patient's status including troponin result which is normal.      Iver Marker, MD 09/26/23 [phone]

## 2023-09-25 NOTE — ED Provider Notes (Signed)
 [facility] Provider Note    Event Date/Time   First MD Initiated Contact with Patient 09/25/23 2046     (approximate)  History   Chief Complaint: Weakness  HPI  [name] [name] is a 78 y.o. male with a past medical history of CHF, arthritis hypertension hyperlipidemia, diabetes, presents to the emergency department for generalized weakness and nausea.  According to the patient several weeks ago he was admitted to the hospital for cellulitis.  He states since going home he completed his course of antibiotics and he was feeling somewhat better but over the past week or so he has been feeling very nauseated and weak at times.  States tonight he tried to drink water  and vomited so he came to the emergency department for evaluation.  Patient denies any specific pain complaint denies any chest pain or abdominal pain.  Patient states some nausea currently.  Physical Exam   Triage Vital Signs: ED Triage Vitals  Encounter Vitals Group     BP 09/25/23 2050 110/84     Systolic BP Percentile --      Diastolic BP Percentile --      Pulse Rate 09/25/23 2050 63     Resp 09/25/23 2050 12     Temp 09/25/23 2050 98.1 F (36.7 C)     Temp Source 09/25/23 2050 Oral     SpO2 09/25/23 2050 99 %     Weight 09/25/23 2053 240 lb (108.9 kg)     Height 09/25/23 2053 5\' 9"  (1.753 m)     Head Circumference --      Peak Flow --      Pain Score 09/25/23 2051 8     Pain Loc --      Pain Education --      Exclude from Growth Chart --     Most recent vital signs: Vitals:   09/25/23 2050  BP: 110/84  Pulse: 63  Resp: 12  Temp: 98.1 F (36.7 C)  SpO2: 99%    General: Awake, no distress.  CV:  Good peripheral perfusion.  Regular rate and rhythm  Resp:  Normal effort.  Equal breath sounds bilaterally.  Abd:  No distention.  Soft, nontender.  Benign abdomen.  ED Results / Procedures / Treatments   EKG  EKG viewed and interpreted by myself shows sinus arrhythmia 61 bpm  with a narrow QRS, normal axis, largely normal intervals with no concerning ST changes.   MEDICATIONS ORDERED IN ED: Medications  ondansetron  (ZOFRAN ) injection 4 mg (has no administration in time range)  sodium chloride  0.9 % bolus 1,000 mL (1,000 mLs Intravenous New Bag/Given 09/25/23 2129)     IMPRESSION / MDM / ASSESSMENT AND PLAN / ED COURSE  I reviewed the triage vital signs and the nursing notes.  Patient's presentation is most consistent with acute presentation with potential threat to life or bodily function.  Patient presents emergency department for generalized weakness and nausea ongoing over the past 1 week or so.  Overall the patient appears well, reassuring vital signs, reassuring lab work with a normal urinalysis, normal CBC with a normal white blood cell count, reassuring chemistry with normal renal function.  I have added on a troponin given the patient's nausea and weakness as a precaution.  If the troponin is negative I believe the patient could be discharged home with a nausea medication.  Patient receiving IV fluids and states he is already feeling better.  If troponin is negative and patient  is able to tolerate fluids orally I believe the patient can be safely discharged home.  Patient care signed out to oncoming provider.  FINAL CLINICAL IMPRESSION(S) / ED DIAGNOSES   Weakness Nausea   Note:  This document was prepared using Dragon voice recognition software and may include unintentional dictation errors.   Ruth Cove, MD 09/25/23 [phone]

## 2023-09-25 NOTE — ED Triage Notes (Signed)
 Patient arrives from home by [facility] after becoming dizzy and weak.  He says he hurts all over.  He was recently hospitalized for cellulitis.  Patient has completed his course of antibiotics.  Wife reported to EMS that patient  vomited what appeared to be water .  Patient says nausea has passed, he says it was because he drank water  on an empty stomach.  Patient only complains of generalized pain, but cannot specify, and complains of being weak.

## 2023-09-25 NOTE — ED Notes (Signed)
 Patient given water  to drink and it was tolerated well.  Patient is irate that the ER is not finding out why he doesn't feel well.  Patient says that if he is sent home, he'll be back again to make him feel better.  Patient was educated on the role of and ED in healthcare, and how important primary care follow up is to his well being.  Dr. Azalee Bolds was informed.

## 2023-09-25 NOTE — Discharge Instructions (Addendum)
 Please drink plenty of fluids and take your nausea medication as needed but only as prescribed.  Please follow-up with your primary care doctor for recheck/reevaluation in the next 2 to 3 days.  Return to the emergency department for any significant weakness or any other symptom personally concerning to yourself.

## 2023-09-26 ENCOUNTER — Emergency Department

## 2023-09-26 NOTE — ED Notes (Signed)
 Patient's catheter bag has been emptied.

## 2023-09-26 NOTE — ED Provider Notes (Signed)
 Vitals:   09/25/23 2050 09/25/23 2305  BP: 110/84 107/83  Pulse: 63 100  Resp: 12 18  Temp: 98.1 F (36.7 C)   SpO2: 99% 99%     Patient does not appear to be in acute distress.  He has been drinking small amounts of water .  He continues to report that for about 3 to 4 weeks now he has no appetite, and he has been having difficulty getting in communication to see a specialist through Dr. Dr. Arman Lamprey office, who he has spoken with.  He reports he wants to figure out why this is happening.  He is hemodynamically stable in no acute distress.  Previous imaging did noted to have pretty significant signs of possible urinary retention but now has a Foley catheter that is draining well.  Will repeat CT without contrast.  Evaluate for gross obstructive pathology or concern, if none noted discussed with the patient plan for discharge and need to continue to follow-up closely with PCP and recommended he follow-up with a GI specialist.  Patient understanding    CT ABDOMEN PELVIS WO CONTRAST Result Date: 09/26/2023 CLINICAL DATA:  Bowel obstruction suspected EXAM: CT ABDOMEN AND PELVIS WITHOUT CONTRAST TECHNIQUE: Multidetector CT imaging of the abdomen and pelvis was performed following the standard protocol without IV contrast. RADIATION DOSE REDUCTION: This exam was performed according to the departmental dose-optimization program which includes automated exposure control, adjustment of the mA and/or kV according to patient size and/or use of iterative reconstruction technique. COMPARISON:  09/17/2023 FINDINGS: Lower Chest: Normal. Hepatobiliary: Normal hepatic contours. No intra- or extrahepatic biliary dilatation. Status post cholecystectomy. Pancreas: Atrophic appearance of the pancreas. No focal abnormality. Spleen: Multiple calcified granulomata within the spleen. Adrenals/Urinary Tract: The adrenal glands are normal. No hydronephrosis, nephroureterolithiasis or solid renal mass. The urinary bladder is  normal for degree of distention Stomach/Bowel: There is no hiatal hernia. Normal duodenal course and caliber. No small bowel dilatation or inflammation. Rectosigmoid diverticulosis without acute inflammation. Normal appendix. Vascular/Lymphatic: There is calcific atherosclerosis of the abdominal aorta. No lymphadenopathy. Reproductive: Poor visualization due to streak artifact from hip arthroplasty. Other: None. Musculoskeletal: Grade 1 anterolisthesis at L4-5. IMPRESSION: 1. No acute abnormality of the abdomen or pelvis. Aortic Atherosclerosis (ICD10-I70.0). Electronically Signed   By: Juanetta Nordmann M.D.   On: 09/26/2023 02:31     ----------------------------------------- 2:58 AM on 09/26/2023 ----------------------------------------- Patient is resting comfortably without distress.  He is rather upset that he has seen multiple physicians in thus far they have not found an answer to his problems.  I discussed with him, and will make referral to GI.  He is already communicated he reports with his primary doctor on this.  He is awake alert in no distress able to take water  by mouth.  He is appropriate for discharge, patient is agreeable to discharge but voices frustration and time it takes to get in contact with physicians, arrange appointments etc.   Return precautions and treatment recommendations and follow-up discussed with the patient who is agreeable with the plan.    Iver Marker, MD 09/26/23 [phone]

## 2023-09-26 NOTE — ED Notes (Signed)
 When patient was presented discharge paperwork, patient began to rail on about how this hospital is run poorly and that it was asinine to discharge a person home at 0300.  He wanted to stay.  He criticized the ED for having patients in the hallway, and he called the doctor an idiot.  Patient was calling his wife for a ride home.

## 2023-09-26 NOTE — ED Notes (Addendum)
 Fall precautions in place for Pt. This RN placed fall band, fall grip socks, bed alarm and fall sign. However Pt is voicing aggravation with fall precautions.

## 2023-09-27 ENCOUNTER — Emergency Department (HOSPITAL_COMMUNITY)
Admission: EM | Admit: 2023-09-27 | Discharge: 2023-09-27 | Disposition: A | Discharge status: Home / Self Care | Attending: Emergency Medicine | Admitting: Emergency Medicine

## 2023-09-27 ENCOUNTER — Other Ambulatory Visit: Payer: Self-pay

## 2023-09-27 ENCOUNTER — Encounter (HOSPITAL_COMMUNITY): Payer: Self-pay

## 2023-09-27 DIAGNOSIS — R112 Nausea with vomiting, unspecified: Secondary | ICD-10-CM | POA: Diagnosis not present

## 2023-09-27 DIAGNOSIS — L03116 Cellulitis of left lower limb: Secondary | ICD-10-CM | POA: Diagnosis not present

## 2023-09-27 DIAGNOSIS — R2242 Localized swelling, mass and lump, left lower limb: Secondary | ICD-10-CM | POA: Diagnosis present

## 2023-09-27 LAB — COMPREHENSIVE METABOLIC PANEL WITH GFR
ALT: 11 U/L (ref 0–44)
AST: 17 U/L (ref 15–41)
Albumin: 3.5 g/dL (ref 3.5–5.0)
Alkaline Phosphatase: 67 U/L (ref 38–126)
Anion gap: 8 (ref 5–15)
BUN: 13 mg/dL (ref 8–23)
CO2: 24 mmol/L (ref 22–32)
Calcium: 9.9 mg/dL (ref 8.9–10.3)
Chloride: 99 mmol/L (ref 98–111)
Creatinine, Ser: 0.97 mg/dL (ref 0.61–1.24)
GFR, Estimated: >60 mL/min (ref >60)
Glucose, Bld: 127 mg/dL — ABNORMAL HIGH (ref 70–99)
Potassium: 3.9 mmol/L (ref 3.5–5.1)
Sodium: 131 mmol/L — ABNORMAL LOW (ref 135–145)
Total Bilirubin: 0.6 mg/dL (ref 0.0–1.2)
Total Protein: 6.8 g/dL (ref 6.5–8.1)

## 2023-09-27 LAB — CBC WITH DIFFERENTIAL/PLATELET
Abs Immature Granulocytes: 0.05 10*3/uL (ref 0.00–0.07)
Basophils Absolute: 0 10*3/uL (ref 0.0–0.1)
Basophils Relative: 1 %
Eosinophils Absolute: 0.2 10*3/uL (ref 0.0–0.5)
Eosinophils Relative: 4 %
HCT: 32.6 % — ABNORMAL LOW (ref 39.0–52.0)
Hemoglobin: 11.2 g/dL — ABNORMAL LOW (ref 13.0–17.0)
Immature Granulocytes: 1 %
Lymphocytes Relative: 37 %
Lymphs Abs: 2.5 10*3/uL (ref 0.7–4.0)
MCH: 31 pg (ref 26.0–34.0)
MCHC: 34.4 g/dL (ref 30.0–36.0)
MCV: 90.3 fL (ref 80.0–100.0)
Monocytes Absolute: 0.8 10*3/uL (ref 0.1–1.0)
Monocytes Relative: 13 %
Neutro Abs: 3 10*3/uL (ref 1.7–7.7)
Neutrophils Relative %: 44 %
Platelets: 257 10*3/uL (ref 150–400)
RBC: 3.61 MIL/uL — ABNORMAL LOW (ref 4.22–5.81)
RDW: 13 % (ref 11.5–15.5)
WBC: 6.6 10*3/uL (ref 4.0–10.5)
nRBC: 0 % (ref 0.0–0.2)

## 2023-09-27 LAB — I-STAT CG4 LACTIC ACID, ED: Lactic Acid, Venous: 0.8 mmol/L (ref 0.5–1.9)

## 2023-09-27 LAB — LIPASE, BLOOD: Lipase: 24 U/L (ref 11–51)

## 2023-09-27 MED ORDER — SODIUM CHLORIDE 0.9 % IV SOLN
1.0000 g | Freq: Once | INTRAVENOUS | Status: AC
  Administered 2023-09-27: 1 g via INTRAVENOUS
  Filled 2023-09-27: qty 10

## 2023-09-27 MED ORDER — ONDANSETRON 4 MG PO TBDP: 4.0000 mg | ORAL_TABLET | ORAL | 0 refills | Status: AC | PRN

## 2023-09-27 MED ORDER — CEPHALEXIN 500 MG PO CAPS: 500.0000 mg | ORAL_CAPSULE | Freq: Four times a day (QID) | ORAL | 0 refills | Status: DC

## 2023-09-27 MED ORDER — LACTATED RINGERS IV BOLUS
1000.0000 mL | Freq: Once | INTRAVENOUS | Status: AC
  Administered 2023-09-27: 1000 mL via INTRAVENOUS

## 2023-09-27 NOTE — Discharge Instructions (Signed)
 1.  Your nausea and vomiting may be due to medications.  Antibiotics can cause this and your diabetes medications may cause the symptoms.  You will need close follow-up with your doctor.  If your symptoms do not go away once you have completed antibiotic therapy you will need referral to a gastroenterologist for further testing such as an upper endoscopy. 2.  The cellulitis, infection of your foot, has improved but is still present.  You were given 1 IV dose of medication in the emergency department and you will continue an additional 4 days of Keflex  as prescribed. 3.  Take the nausea medication Zofran  if needed. 4.  You have been instructed to continue your Protonix  from your last hospital visit.  Continue this medication as instructed.  This helps with symptoms of reflux and stomach inflammation. 5.  Only bland foods such as plain rice, potatoes with minimal amount of added butter or fat, oatmeal, you may also use patient supplements such as Ensure.  Eat these things in small amounts.  Do this for the next week until you have completed your antibiotic therapy and increased your dose of your reflux medication. 6.  Return to the emergency department if you are having worsening symptoms.

## 2023-09-27 NOTE — ED Provider Notes (Signed)
 [place] EMERGENCY DEPARTMENT AT [facility] Provider Note   CSN: [id number] Arrival date & time: 09/27/23  1639     History  Chief Complaint  Patient presents with   Abdominal Pain    [name] [name] is a 78 y.o. male.  HPI Patient was hospitalized for left leg cellulitis and abdominal pain and urinary retention discharge [phone].  Patient reports that he has continued to have problems with nausea and vomiting and general weakness.  He reports anytime he eats something he is throwing up shortly afterwards.  He has not had a fever.  He has some epigastric discomfort but not significant pain.  He has a Foley catheter in place from prior hospitalization whereupon he had urinary retention.  He has has follow-up with urology established.  The patient reports that he ate some crackers and cheese and ham this morning and within about 20 minutes he threw it back up.  This has happened several times since his discharge.  He has been able to drink fluids.  Patient was treated with Keflex  for 4 days postop hospitalization which he finished.  Patient reports that the redness and swelling in his foot is significantly improved although not gone.    Home Medications Prior to Admission medications   Medication Sig Start Date End Date Taking? Authorizing Provider  ondansetron  (ZOFRAN -ODT) 4 MG disintegrating tablet Take 1 tablet (4 mg total) by mouth every 4 (four) hours as needed. 09/27/23  Yes Wynetta Heckle, MD  cephALEXin  (KEFLEX ) 500 MG capsule Take 1 capsule (500 mg total) by mouth 4 (four) times daily. 09/27/23   Wynetta Heckle, MD  cyclobenzaprine  (FLEXERIL ) 10 MG tablet Take 1-2 tablets (10-20 mg total) by mouth See admin instructions. 10 mg in the morning, 20 mg in the evening 01/01/23   Rosea Conch, Isami, DO  dicyclomine  (BENTYL ) 10 MG capsule Take 1 capsule (10 mg total) by mouth 2 (two) times daily. 07/02/17   Ulysees Gander, MD  dipyridamole -aspirin  (AGGRENOX ) 200-25 MG 12hr  capsule Take 1 capsule by mouth 2 (two) times daily. 04/06/20   [provider]  docusate sodium  (COLACE) 100 MG capsule Take 100 mg by mouth every evening.    [provider]  Evolocumab (REPATHA SURECLICK) 140 MG/ML SOAJ Inject 140 mg into the skin every 14 (fourteen) days. 09/23/19   [provider]  gabapentin  (NEURONTIN ) 300 MG capsule Take 1-2 capsules (300-600 mg total) by mouth See admin instructions. 300 mg in the morning, 600 mg in the evening 01/01/23   Sakai, Isami, DO  glimepiride  (AMARYL ) 4 MG tablet Take 4 mg by mouth every evening. 03/27/23   [provider]  ondansetron  (ZOFRAN ) 4 MG tablet Take 1 tablet (4 mg total) by mouth every 6 (six) hours as needed for nausea. 07/25/23   Rojelio Clement, PA-C  ondansetron  (ZOFRAN -ODT) 4 MG disintegrating tablet Take 1 tablet (4 mg total) by mouth every 8 (eight) hours as needed for nausea or vomiting. 09/25/23   Ruth Cove, MD  oxyCODONE  (OXY IR/ROXICODONE ) 5 MG immediate release tablet Take 1-2 tablets (5-10 mg total) by mouth every 4 (four) hours as needed for moderate pain (pain score 4-6). 07/25/23   Rojelio Clement, PA-C  pantoprazole  (PROTONIX ) 40 MG tablet Take 1 tablet (40 mg total) by mouth every morning. 03/28/17   Ulysees Gander, MD  Semaglutide , 1 MG/DOSE, 4 MG/3ML SOPN Inject 1 mg into the skin every 7 (seven) days. 05/20/23   [provider]  tamsulosin  (FLOMAX ) 0.4 MG CAPS capsule Take 0.4 mg by mouth daily. 02/17/23   [provider]      Allergies    Parafon forte dsc [chlorzoxazone], Statins, Augmentin [amoxicillin-pot clavulanate], Midodrine  hcl, Cucumber extract, and Plavix  [clopidogrel  bisulfate]    Review of Systems   Review of Systems  Physical Exam Updated Vital Signs BP 134/82 (BP Location: Left Arm)   Pulse 97   Temp 98.2 F (36.8 C) (Oral)   Resp 16   Ht 5\' 9"  (1.753 m)   Wt 109 kg   SpO2 100%   BMI 35.49 kg/m  Physical  Exam Constitutional:      Comments: Patient is alert and nontoxic.  No respiratory distress  HENT:     Head: Normocephalic and atraumatic.     Mouth/Throat:     Pharynx: Oropharynx is clear.  Eyes:     Extraocular Movements: Extraocular movements intact.  Cardiovascular:     Rate and Rhythm: Normal rate and regular rhythm.  Pulmonary:     Effort: Pulmonary effort is normal.     Breath sounds: Normal breath sounds.  Abdominal:     General: There is no distension.     Palpations: Abdomen is soft.     Tenderness: There is no abdominal tenderness. There is no guarding.  Genitourinary:    Comments: Patient has Foley catheter in place.  Clear urine in the bag. Musculoskeletal:        General: Normal range of motion.     Comments: Patient has some blanching erythema of the left foot and lower leg.  He reports this is improved relative to previously.  See attached images.  Skin:    General: Skin is warm and dry.  Neurological:     General: No focal deficit present.     Mental Status: He is oriented to person, place, and time.     Comments: No focal weakness.  Patient is hard of hearing but speech is situationally appropriate with normal content.     ED Results / Procedures / Treatments   Labs (all labs ordered are listed, but only abnormal results are displayed) Labs Reviewed  COMPREHENSIVE METABOLIC PANEL WITH GFR - Abnormal; Notable for the following components:      Result Value   Sodium 131 (*)    Glucose, Bld 127 (*)    All other components within normal limits  CBC WITH DIFFERENTIAL/PLATELET - Abnormal; Notable for the following components:   RBC 3.61 (*)    Hemoglobin 11.2 (*)    HCT 32.6 (*)    All other components within normal limits  LIPASE, BLOOD  I-STAT CG4 LACTIC ACID, ED    EKG None  Radiology CT ABDOMEN PELVIS WO CONTRAST Result Date: 09/26/2023 CLINICAL DATA:  Bowel obstruction suspected EXAM: CT ABDOMEN AND PELVIS WITHOUT CONTRAST TECHNIQUE:  Multidetector CT imaging of the abdomen and pelvis was performed following the standard protocol without IV contrast. RADIATION DOSE REDUCTION: This exam was performed according to the departmental dose-optimization program which includes automated exposure control, adjustment of the mA and/or kV according to patient size and/or use of iterative reconstruction technique. COMPARISON:  09/17/2023 FINDINGS: Lower Chest: Normal. Hepatobiliary: Normal hepatic contours. No intra- or extrahepatic biliary dilatation. Status post cholecystectomy. Pancreas: Atrophic appearance of the pancreas. No focal abnormality. Spleen: Multiple calcified granulomata within the spleen. Adrenals/Urinary Tract: The adrenal glands are normal. No hydronephrosis, nephroureterolithiasis or solid renal mass. The urinary bladder is normal for degree of distention Stomach/Bowel: There is no  hiatal hernia. Normal duodenal course and caliber. No small bowel dilatation or inflammation. Rectosigmoid diverticulosis without acute inflammation. Normal appendix. Vascular/Lymphatic: There is calcific atherosclerosis of the abdominal aorta. No lymphadenopathy. Reproductive: Poor visualization due to streak artifact from hip arthroplasty. Other: None. Musculoskeletal: Grade 1 anterolisthesis at L4-5. IMPRESSION: 1. No acute abnormality of the abdomen or pelvis. Aortic Atherosclerosis (ICD10-I70.0). Electronically Signed   By: Juanetta Nordmann M.D.   On: 09/26/2023 02:31    Procedures Procedures    Medications Ordered in ED Medications  cefTRIAXone  (ROCEPHIN ) 1 g in sodium chloride  0.9 % 100 mL IVPB (0 g Intravenous Stopped 09/27/23 1923)  lactated ringers  bolus 1,000 mL (0 mLs Intravenous Stopped 09/27/23 2143)    ED Course/ Medical Decision Making/ A&P                                 Medical Decision Making Amount and/or Complexity of Data Reviewed Labs: ordered.  Risk Prescription drug management.   Patient presents as outlined.  He had  his wife have concerned because he was just recently seen at the emergency department at [facility] and feels like he still has the same symptoms.  CT scan was done yesterday that shows no obstruction or any significant acute findings.  Patient's vital signs are stable.  Will proceed with basic lab work.  At this time however I do not think repeat CT imaging for the abdomen is indicated.  I-STAT 0.8 lipase 24 sodium 131 GFR greater than 60 glucose 127 potassium 3.9 white count 6.6 H&H 11.2 and 32 normal differential  I have personally reviewed CT images from yesterday.  No obstruction no appearance of stranding or inflammatory change, the CT has been interpreted by radiology without acute findings.  At this time, on exam patient does still have blanching erythema and warmth of the left foot.  He qualifies this is much better than it was previously.  At this time however I do feel he has persistent cellulitis that has not yet resolved.  Will administer 1 dose of Rocephin  and continue an additional 5 days of Keflex .  Clinically patient does not appear dehydrated, vital signs are stable, lab work does not indicate any significant abnormalities.  We discussed different potential causes for nausea and vomiting.  Patient has had antibiotic therapy and may be experiencing some GI symptoms.  He will be given Zofran  to take as needed.  It is already been recommended to him to increase his Protonix  dose in the event he has gastritis or severe GERD.  Patient voices understanding for proceeding with this recommendation.  We discussed the possibility of nausea in the setting of taking Ozempic  and also possible diabetic gastroparesis.  Patient will need follow-up with GI if symptoms are not resolving once he has completed all  antibiotic therapy and recovered from more recent hospitalization.  At this time however patient does not appear to need hospitalization.  He is maintaining hydration and able to take  medications at home.  Return precautions reviewed.        Final Clinical Impression(s) / ED Diagnoses Final diagnoses:  Cellulitis of left lower extremity  Nausea and vomiting, unspecified vomiting type    Rx / DC Orders ED Discharge Orders          Ordered    cephALEXin  (KEFLEX ) 500 MG capsule  4 times daily,   Status:  Discontinued  09/27/23 2111    ondansetron  (ZOFRAN -ODT) 4 MG disintegrating tablet  Every 4 hours PRN        09/27/23 2115    cephALEXin  (KEFLEX ) 500 MG capsule  4 times daily        09/27/23 2157              Wynetta Heckle, MD 09/27/23 2205

## 2023-09-27 NOTE — ED Triage Notes (Signed)
 Patient has had right upper quadrant abdominal pain for a few days. No vomiting or diarrhea.

## 2023-10-01 ENCOUNTER — Encounter: Payer: Self-pay | Admitting: Physician Assistant

## 2023-10-01 ENCOUNTER — Ambulatory Visit: Admitting: Physician Assistant

## 2023-10-01 VITALS — BP 87/54 | HR 57 | Ht 69.0 in

## 2023-10-01 DIAGNOSIS — R339 Retention of urine, unspecified: Secondary | ICD-10-CM | POA: Diagnosis not present

## 2023-10-01 LAB — BLADDER SCAN AMB NON-IMAGING: Scan Result: 201 mL

## 2023-10-01 NOTE — Progress Notes (Signed)
 Fill and Pull Catheter Removal  Patient is present today for a catheter removal.  10ml of water  was then drained from the balloon.  A 16FR foley cath was removed from the bladder no complications were noted .   Performed by: S.Ebonye Reade, CMA  Follow up/ Additional notes: later today
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
## 2023-10-01 NOTE — Patient Instructions (Signed)
 Please call Dr. Arman Lamprey office to schedule outpatient follow up with him after your recent hospitalization.

## 2023-10-03 NOTE — Progress Notes (Signed)
 Chief Complaint: Chief Complaint  Patient presents with  . Post Operative Visit    PO - LT THA - 02.20.25/POGGI    [name] [name] is a 78 y.o. male who presents today for repeat evaluation status post left total hip arthroplasty performed by Dr. Edie on 07/24/2023.  The patient is now over 2 months status post surgery.  Overall the patient feels that he is doing well at today's visit.  He denies any pain in the left hip at today's appointment.  He does take oxycodone  for chronic pain.  He has not suffered any falls or injury.  He denies any signs of infection at home such as fevers chills or any drainage from the left hip incision site.  The patient is no longer doing any formal physical therapy at this time.  The patient does still use a rollator and also a cane for assistance with ambulation.  The patient is ambulating around his house well at this time.  He feels that he is doing well pertaining to his left hip.  Past Medical History: Past Medical History:  Diagnosis Date  . Allergy    Parafon Forte, plavix , augmenten  . CAD (coronary artery disease) 08/19/2013   MI in 08/2009  . Cataract cortical, senile   . Cerebrovascular accident (CMS/HHS-HCC) 05/10/2020  . CHF (congestive heart failure) (CMS/HHS-HCC) 08/19/2013  . 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 GERD (gastroesophageal reflux disease) 08/19/2013  . Hearing impaired 08/19/2013   Profound hearing loss in right ear, wears hearing aide in left ear  . Hyperlipidemia 08/19/2013  . Hypertension 08/19/2013  . Lumbar radiculopathy 08/19/2013  . Myocardial infarction (CMS/HHS-HCC)   . Neck pain   . Osteoarthritis   . Stroke (CMS/HHS-HCC) 11/09/2017  . Type 2 diabetes mellitus with complication, without long-term current use of insulin  (CMS/HHS-HCC) 05/10/2020  . Type 2 diabetes mellitus with complication, without long-term current use of insulin  (CMS/HHS-HCC) 05/10/2020    Past Surgical  History: Past Surgical History:  Procedure Laterality Date  . LAPAROSCOPIC CHOLECYSTECTOMY  2010  . COLONOSCOPY  03/30/2009  . EGD  05/21/2010   04/09/2010. No repeat per [facility].  SABRA CORONARY ARTERY BYPASS GRAFT  08/19/2013   5 artery  . CORONARY ARTERY BYPASS W/SINGLE ARTERY GRAFT N/A 08/20/2013   Procedure: CORONARY ARTERY BYPASS W/SINGLE ARTERY GRAFT, Left internal mammary artery harvest;  Surgeon: Lamar Royal Nicholaus Mickey., MD;  Location: [facility];  Service: Cardiothoracic;  Laterality: N/A;  . CORONARY ARTERY BYPASS W/VENOUS & ARTERIAL GRAFTS N/A 08/20/2013   Procedure: CORONARY ARTERY BYPASS W/VENOUS & ARTERIAL GRAFTS;  Surgeon: Lamar Royal Nicholaus Mickey., MD;  Location: [facility];  Service: Cardiothoracic;  Laterality: N/A;  . TRANSESOPHAGEAL ECHOCARDIOGRAPHY N/A 08/20/2013   Procedure: TRANSESOPHAGEAL ECHOCARDIOGRAPHY;  Surgeon: Lamar Royal Nicholaus Mickey., MD;  Location: [facility];  Service: Cardiothoracic;  Laterality: N/A;  . ENDOSCOPIC CARPAL TUNNEL RELEASE Left 10/20/2014  . Impingement/tendinopathy with full-thickness rotator cuff tear, biceps tendinopathy, and SLAP tear, right shoulder  Right 06/06/2015   Dr.Poggi   . back surgery  05/06/2018   L4 & L5   . Left Long and Ring Trigger Finger Release Left 05/29/2018   Ozell Flake, MD  . KNEE ARTHROSCOPY Right 08/07/2021   By Dr. Flake  . excision of epidermal cyst  01/01/2023   Dr. Henriette Pierre  . ARTHROPLASTY HIP TOTAL Left 07/24/2023   Dr. Edie  . ANTERIOR FUSION CERVICAL SPINE    .  CATARACT EXTRACTION Bilateral   . CHOLECYSTECTOMY    . MICRODISCECTOMY LUMBAR     x3  . ORIF ANKLE FRACTURE Left   . POSTERIOR LUMBAR SPINE FUSION ONE LEVEL      Past Family History: Family History  Problem Relation Age of Onset  . Liver disease Mother   . Lung cancer Father   . No Known Problems Sister   . No Known Problems Sister   . No Known Problems Sister   . No Known Problems Daughter   . No Known Problems  Sister   . No Known Problems Sister   . Anesthesia problems Neg Hx   . Malignant hyperthermia Neg Hx     Medications: Current Outpatient Medications  Medication Sig Dispense Refill  . ondansetron  (ZOFRAN -ODT) 4 MG disintegrating tablet Take 1 tablet (4 mg total) by mouth every 4 (four) hours as needed.    . aspirin -dipyridamole  (AGGRENOX ) 25-200 mg ER capsule Take 1 capsule by mouth 2 (two) times daily 60 capsule 5  . blood glucose diagnostic test strip 1 each (1 strip total) once daily Use as instructed. 100 each 3  . blood glucose meter kit as directed Use to monitor blood sugar once daily as directed. Please dispense preferred product based on patient's insurance. Test strips + Lancets 1 each 0  . blood glucose meter kit as directed 1 each 0  . clotrimazole-betamethasone  (LOTRISONE) 1-0.05 % cream Apply topically 2 (two) times daily To left foot but not between toes 30 g 3  . cyanocobalamin (VITAMIN B12) 1000 MCG tablet Take 1 tablet (1,000 mcg total) by mouth once daily 90 tablet 1  . cyclobenzaprine  (FLEXERIL ) 10 MG tablet TAKE 1 TABLET BY MOUTH 3 TIMES DAILY AS NEEDED FOR MUSCLE SPASMS 90 tablet 3  . dicyclomine  (BENTYL ) 10 mg capsule TAKE 1 CAPSULE BY MOUTH TWICE DAILY 60 capsule 1  . docusate (COLACE) 100 MG capsule Take 100 mg by mouth at bedtime    . finasteride (PROSCAR) 5 mg tablet Take 1 tablet (5 mg total) by mouth once daily 90 tablet 1  . gabapentin  (NEURONTIN ) 300 MG capsule TAKE 1 CAPSULE BY MOUTH 3 TIMES DAILY 270 capsule 1  . glimepiride  (AMARYL ) 4 MG tablet Take 1 tablet (4 mg total) by mouth daily with breakfast 90 tablet 1  . lancing device with lancets kit Use 1 each once daily Use as instructed. 100 each 3  . midodrine  (PROAMATINE ) 10 MG tablet Take 10 mg by mouth 3 (three) times daily    . oxyCODONE  (OXYIR) 10 mg immediate release tablet TAKE 1 TABLET BY MOUTH TWICE DAILY AS NEEDED 60 tablet 0  . pantoprazole  (PROTONIX ) 40 MG DR tablet Take 1 tablet (40 mg total) by  mouth 2 (two) times daily before meals 180 tablet 1  . polyethylene glycol (MIRALAX ) powder Take 17 g by mouth once daily Mix in 4-8ounces of fluid prior to taking. Take 2-3 times weekly    . REPATHA SURECLICK 140 mg/mL PnIj INJECT 140MG  SUBCUTANEOUSLY EVERY 14 DAYS 2 mL 11  . semaglutide  (OZEMPIC ) 0.25 mg or 0.5 mg(2 mg/1.5 mL) pen injector Inject 0.375 mLs (0.5 mg total) subcutaneously once a week 3 mL 0  . tamsulosin  (FLOMAX ) 0.4 mg capsule Take 2 capsules (0.8 mg total) by mouth once daily Take 30 minutes after same meal each day. 180 capsule 1   No current facility-administered medications for this visit.    Allergies: Allergies  Allergen Reactions  . Statins-Hmg-Coa Reductase Inhibitors Muscle Pain  .  Amoxicillin-Pot Clavulanate Diarrhea    Gave him cdiff  . Cucumber Fruit Extract Other (See Comments)    GI upset  . Parafon Forte [Chlorzoxazone] Itching    severe  . Clopidogrel  Bisulfate Rash     Review of Systems:  A comprehensive 14 point ROS was performed, reviewed by me today, and the pertinent orthopaedic findings are documented in the HPI.   Exam: BP 110/62   Ht 175.3 cm (5' 9)   Wt 81.6 kg (180 lb)   BMI 26.58 kg/m  General/Constitutional: The patient appears to be well-nourished, well-developed, and in no acute distress. Neuro/Psych: Normal mood and affect, oriented to person, place and time.  General: Well developed, well nourished 78 y.o. male in no apparent distress.  Normal affect.  Normal communication.  Patient answers questions appropriately.  The patient presents today in a wheelchair.  Left Lower Extremity: Examination of the left hip reveals the incision to be intact.  The incision is well-healed at this time without any evidence for infection.  There is no erythema, warmth, or drainage. The patient had no pain with hip internal and external rotation. 4/5 strength with hip abduction and adduction.  The ankle and knee had full active range of motion with  no pain.  There was less than 2 second capillary refill. The patient had a negative straight leg raise.  The sensation was intact to light touch.  The patient has a negative Homan's test.  There is good skin warmth.   Imaging: AP and lateral views of the left hip were obtained today in the office and reviewed by me.  These x-rays demonstrate no evidence of fracture or dislocation.  The total hip arthroplasty hardware is in good position without any evidence of loosening.  The femoral component does not demonstrate any evidence of settling.  The femoral component is located centrally within the acetabular component.  Impression: Status post total hip replacement, left [Z96.642] Status post total hip replacement, left  (primary encounter diagnosis) Primary osteoarthritis of left hip  Plan:  1.  Treatment options were discussed today with the patient. 2.  Skin examination left hip demonstrates a well-healed surgical incision without any evidence for infection. 3.  X-rays at today's visit demonstrate no acute changes to the left hip when compared to postoperative x-rays. 4.  He may continue to perform activities as tolerated.  Continue to use a cane or rollator for assistance with ambulation. 5.  The patient will follow-up with me or Dr. Edie in 3 months, x-rays of the left hip will be obtained at this time.  They can call the clinic they have any questions, new symptoms develop or symptoms worsen.  This office visit took 15 minutes, of which >50% involved patient counseling/education.  Review of the [place] CSRS was performed in accordance of the [facility] prior to dispensing any controlled drugs.  This note was generated in part with voice recognition software and I apologize for any typographical errors that were not detected and corrected.  DOROTHA Gustavo Level, PA-C, CAQ-OS [facility]

## 2023-11-10 ENCOUNTER — Encounter: Payer: Self-pay | Admitting: Physician Assistant

## 2023-11-10 ENCOUNTER — Ambulatory Visit: Admitting: Physician Assistant

## 2023-11-10 VITALS — BP 112/63 | HR 61 | Ht 69.0 in | Wt 191.0 lb

## 2023-11-10 DIAGNOSIS — R110 Nausea: Secondary | ICD-10-CM | POA: Diagnosis not present

## 2023-11-10 DIAGNOSIS — R339 Retention of urine, unspecified: Secondary | ICD-10-CM

## 2023-11-10 LAB — BLADDER SCAN AMB NON-IMAGING

## 2023-11-10 NOTE — Patient Instructions (Addendum)
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
## 2023-11-14 ENCOUNTER — Encounter: Payer: Self-pay | Admitting: Gastroenterology

## 2023-11-25 ENCOUNTER — Other Ambulatory Visit: Payer: Self-pay | Admitting: Internal Medicine

## 2023-11-25 DIAGNOSIS — R222 Localized swelling, mass and lump, trunk: Secondary | ICD-10-CM

## 2023-11-25 DIAGNOSIS — R9389 Abnormal findings on diagnostic imaging of other specified body structures: Secondary | ICD-10-CM

## 2023-11-25 DIAGNOSIS — R918 Other nonspecific abnormal finding of lung field: Secondary | ICD-10-CM

## 2023-12-02 ENCOUNTER — Other Ambulatory Visit: Payer: Self-pay

## 2023-12-02 ENCOUNTER — Ambulatory Visit: Admitting: Urology

## 2023-12-02 VITALS — BP 113/67 | HR 60 | Ht 69.0 in

## 2023-12-02 DIAGNOSIS — N401 Enlarged prostate with lower urinary tract symptoms: Secondary | ICD-10-CM | POA: Diagnosis not present

## 2023-12-02 DIAGNOSIS — R339 Retention of urine, unspecified: Secondary | ICD-10-CM | POA: Diagnosis not present

## 2023-12-02 DIAGNOSIS — R3914 Feeling of incomplete bladder emptying: Secondary | ICD-10-CM | POA: Diagnosis not present

## 2023-12-02 MED ORDER — CEPHALEXIN 250 MG PO CAPS
500.0000 mg | ORAL_CAPSULE | Freq: Once | ORAL | Status: AC
  Administered 2023-12-02: 500 mg via ORAL

## 2023-12-02 MED ORDER — LIDOCAINE HCL URETHRAL/MUCOSAL 2 % EX GEL
1.0000 | Freq: Once | CUTANEOUS | Status: AC
  Administered 2023-12-02: 1 via URETHRAL

## 2023-12-02 NOTE — Progress Notes (Signed)
 Cystoscopy Procedure Note:  Indication: Urinary retention, incomplete emptying, overflow incontinence  Keflex  given for prophylaxis  After informed consent and discussion of the procedure and its risks, [name] [name] was positioned and prepped in the standard fashion. Cystoscopy was performed with a flexible cystoscope. The urethra, bladder neck and entire bladder was visualized in a standard fashion. The prostate was moderate in size with lateral lobe hypertrophy. The ureteral orifices were visualized in their normal location and orientation.  Debris in the urine limited vision, massively distended bladder  Imaging: I personally viewed and interpreted the CT scan dated 09/26/2023 showing a 50 g prostate, distended bladder, bilateral hydronephrosis down to the bladder  Findings: Moderate size prostate, massively distended bladder  ------------------------------------------  Assessment and Plan: 78 year old male with likely overflow incontinence refractory to medical management, incomplete emptying.  He is interested in surgical options.  We discussed alternatives including Foley catheter placement, intermittent catheterization, or HOLEP with best chance of resuming spontaneous voiding.  He refuses Foley catheter placement today and understands risk of retention, infection, sepsis.  We discussed the less than 10% chance after HOLEP of persistent incomplete emptying requiring intermittent catheterization or Foley placement.  We discussed the risks and benefits of HoLEP at length.  The procedure requires general anesthesia and takes 1 to 2 hours, and a holmium laser is used to enucleate the prostate and push this tissue into the bladder.  A morcellator is then used to remove this tissue, which is sent for pathology.  The vast majority(>95%) of patients are able to discharge the same day with a catheter in place for 2 to 3 days, and will follow-up in clinic for a voiding trial.  We specifically  discussed the risks of bleeding, infection, retrograde ejaculation, temporary urgency and urge incontinence, very low risk of long-term incontinence, urethral stricture/bladder neck contracture, pathologic evaluation of prostate tissue and possible detection of prostate cancer or other malignancy, and possible need for additional procedures.  Schedule HOLEP  [name] Burnet, MD 12/02/2023

## 2023-12-02 NOTE — Progress Notes (Unsigned)
 Surgical Physician Order Form [facility]  Dr. Redell Burnet, MD  * Scheduling expectation : Next Available  *Length of Case: 1.5 hours  *Clearance needed: no  *Anticoagulation Instructions: Hold all anticoagulants  *Aspirin  Instructions: Hold Aspirin   *Post-op visit Date/Instructions:  1-3 day cath removal, 4 months MD PVR  *Diagnosis: BPH w/urinary obstruction  *Procedure:  HOLEP (47350)   Additional orders: N/A  -Admit type: OUTpatient  -Anesthesia: General  -VTE Prophylaxis Standing Order SCD's       Other:   -Standing Lab Orders Per Anesthesia    Lab other: UA&Urine Culture  -Standing Test orders EKG/Chest x-ray per Anesthesia       Test other:   - Medications:  Ancef  2gm IV  -Other orders:  N/A

## 2023-12-02 NOTE — Patient Instructions (Signed)

## 2023-12-04 ENCOUNTER — Telehealth: Payer: Self-pay

## 2023-12-04 NOTE — Telephone Encounter (Signed)
 Per Dr. Francisca, Patient is to be scheduled for  Holmium Laser Enucleation of the Prostate   [name] [name] was contacted and possible surgical dates were discussed, Monday August 4th, 2025 was agreed upon for surgery.   Patient was instructed that Dr. Francisca will require them to provide a pre-op UA & CX prior to surgery. This was ordered and scheduled drop off appointment was made for 12/26/2023.    Patient was directed to call [phone] between 1-3pm the day before surgery to find out surgical arrival time.  Instructions were given not to eat or drink from midnight on the night before surgery and have a driver for the day of surgery. On the surgery day patient was instructed to enter through the Medical Mall entrance of [facility] report the Same Day Surgery desk.   Pre-Admit Testing will be in contact via phone to set up an interview with the anesthesia team to review your history and medications prior to surgery.   Reminder of this information was sent via Mail to the patient.

## 2023-12-04 NOTE — Progress Notes (Signed)
   [facility] Surgical Posting Form  Surgery Date: Date: 01/05/2024  Surgeon: Dr. Redell Burnet, MD  Inpt ( No  )   Outpt (Yes)   Obs ( No  )   Diagnosis: N40.1, N13.8 Benign Prostatic Hyperplasia with Urinary Obstruction  -CPT: 47350  Surgery: Holmium Laser Enucleation of the Prostate  Stop Anticoagulations: Yes and also hold ASA  Cardiac/Medical/Pulmonary Clearance needed: yes  Clearance needed from Dr: [facility]  Clearance request sent on: Date: 12/04/23   *Orders entered into EPIC  Date: 12/04/23   *Case booked in EPIC  Date: 12/03/2023  *Notified pt of Surgery: Date: 12/03/2023  PRE-OP UA & CX: Yes, will obtain in clinic on 12/26/2023  *Placed into Prior Authorization Work [place] Date: 12/04/23  Assistant/laser/rep:No

## 2023-12-04 NOTE — Progress Notes (Signed)
  Phone Number: [phone] for Surgical Coordinator Fax Number: [phone]  REQUEST FOR SURGICAL CLEARANCE       Date: Date: 12/04/23  Faxed to: Dr. Florencio, [facility] Cards  Surgeon: Dr. Redell Burnet, MD     Date of Surgery: 01/05/2024  Operation:  Holmium Laser Enucleation of the Prostate   Anesthesia Type: General   Diagnosis: Benign Prostatic Hyperplasia with Urinary Obstruction  Patient Requires:   Cardiac / Vascular Clearance : Yes   Risk Assessment:    Low   []       Moderate   []     High   []           This patient is optimized for surgery  YES []       NO   []    I recommend further assessment/workup prior to surgery. YES []      NO  []   Appointment scheduled for: _______________________   Further recommendations: ____________________________________     Physician Signature:__________________________________   Printed Name: ________________________________________   Date: _________________

## 2023-12-12 ENCOUNTER — Ambulatory Visit
Admission: RE | Admit: 2023-12-12 | Discharge: 2023-12-12 | Disposition: A | Discharge status: Home / Self Care | Source: Ambulatory Visit | Attending: Internal Medicine | Admitting: Internal Medicine

## 2023-12-12 DIAGNOSIS — R222 Localized swelling, mass and lump, trunk: Secondary | ICD-10-CM | POA: Diagnosis present

## 2023-12-12 DIAGNOSIS — R9389 Abnormal findings on diagnostic imaging of other specified body structures: Secondary | ICD-10-CM | POA: Diagnosis present

## 2023-12-12 DIAGNOSIS — R918 Other nonspecific abnormal finding of lung field: Secondary | ICD-10-CM | POA: Insufficient documentation

## 2023-12-26 ENCOUNTER — Other Ambulatory Visit

## 2023-12-29 ENCOUNTER — Telehealth: Payer: Self-pay | Admitting: Urology

## 2023-12-29 NOTE — Telephone Encounter (Signed)
 LMOM for pt to call office to r/s pre-op urine sample.

## 2023-12-30 ENCOUNTER — Encounter
Admission: RE | Admit: 2023-12-30 | Discharge: 2023-12-30 | Disposition: A | Discharge status: Home / Self Care | Source: Ambulatory Visit | Attending: Urology | Admitting: Urology

## 2023-12-30 ENCOUNTER — Other Ambulatory Visit: Payer: Self-pay

## 2023-12-30 DIAGNOSIS — E118 Type 2 diabetes mellitus with unspecified complications: Secondary | ICD-10-CM

## 2023-12-30 DIAGNOSIS — R2990 Unspecified symptoms and signs involving the nervous system: Secondary | ICD-10-CM

## 2023-12-30 NOTE — Pre-Procedure Instructions (Signed)
 Patient was advised to stop AGGRENOX ) 200-25 MG today, per Dr. Francisca. Until after the procedure.

## 2023-12-30 NOTE — Patient Instructions (Addendum)
 Your procedure is scheduled on: Monday 01/05/24 Report to the Registration Desk on the 1st floor of the Medical Mall. To find out your arrival time, please call [phone] between 1PM - 3PM on: Friday 01/02/24 If your arrival time is 6:00 am, do not arrive before that time as the Medical Mall entrance doors do not open until 6:00 am.  REMEMBER: Instructions that are not followed completely may result in serious medical risk, up to and including death; or upon the discretion of your surgeon and anesthesiologist your surgery may need to be rescheduled.  Do not eat food or drink fluids after midnight the night before surgery.  No gum chewing or hard candies.  One week prior to surgery: Stop Anti-inflammatories (NSAIDS) such as Advil, Aleve, Ibuprofen, Motrin, Naproxen, Naprosyn and Aspirin  based products such as Excedrin, Goody's Powder, BC Powder. Stop ANY OVER THE COUNTER supplements until after surgery.  You may however, continue to take Tylenol  if needed for pain up until the day of surgery.   Stop Semaglutide ,0.25 or 0.5MG /DOS, 2 MG/3ML SOPN 1 week prior to surgery.  Stop dipyridamole -aspirin  (AGGRENOX ) 200-25 MG 5 days prior to surgery (Take last dose Tuesday 12/30/23) as instructed by your surgeon.   Continue taking all of your other prescription medications up until the day of surgery.  ON THE DAY OF SURGERY ONLY TAKE THESE MEDICATIONS WITH SIPS OF WATER :  gabapentin  (NEURONTIN ) 300 MG  pantoprazole  (PROTONIX ) 40 MG  tamsulosin  (FLOMAX ) 0.4 MG CAPS    No Alcohol for 24 hours before or after surgery.  No Smoking including e-cigarettes for 24 hours before surgery.  No chewable tobacco products for at least 6 hours before surgery.  No nicotine patches on the day of surgery.  Do not use any recreational drugs for at least a week (preferably 2 weeks) before your surgery.  Please be advised that the combination of cocaine and anesthesia may have negative outcomes, up to and  including death. If you test positive for cocaine, your surgery will be cancelled.  On the morning of surgery brush your teeth with toothpaste and water , you may rinse your mouth with mouthwash if you wish. Do not swallow any toothpaste or mouthwash.  Use CHG Soap or wipes as directed on instruction sheet.  Do not wear jewelry, make-up, hairpins, clips or nail polish.  For welded (permanent) jewelry: bracelets, anklets, waist bands, etc.  Please have this removed prior to surgery.  If it is not removed, there is a chance that hospital personnel will need to cut it off on the day of surgery.  Do not wear lotions, powders, or perfumes.   Do not shave body hair from the neck down 48 hours before surgery.  Contact lenses, hearing aids and dentures may not be worn into surgery.  Do not bring valuables to the hospital. [facility] is not responsible for any missing/lost belongings or valuables.   Notify your doctor if there is any change in your medical condition (cold, fever, infection).  Wear comfortable clothing (specific to your surgery type) to the hospital.  After surgery, you can help prevent lung complications by doing breathing exercises.  Take deep breaths and cough every 1-2 hours. Your doctor may order a device called an Incentive Spirometer to help you take deep breaths. When coughing or sneezing, hold a pillow firmly against your incision with both hands. This is called "splinting." Doing this helps protect your incision. It also decreases belly discomfort.  If you are being admitted to the  hospital overnight, leave your suitcase in the car. After surgery it may be brought to your room.  In case of increased patient census, it may be necessary for you, the patient, to continue your postoperative care in the Same Day Surgery department.  If you are being discharged the day of surgery, you will not be allowed to drive home. You will need a responsible individual to drive you  home and stay with you for 24 hours after surgery.   If you are taking public transportation, you will need to have a responsible individual with you.  Please call the Pre-admissions Testing Dept. at [phone] if you have any questions about these instructions.  Surgery Visitation Policy:  Patients having surgery or a procedure may have two visitors.  Children under the age of 86 must have an adult with them who is not the patient.  Inpatient Visitation:    Visiting hours are 7 a.m. to 8 p.m. Up to four visitors are allowed at one time in a patient room. The visitors may rotate out with other people during the day.  One visitor age 28 or older may stay with the patient overnight and must be in the room by 8 p.m.   Merchandiser, retail to address health-related social needs:  [email or web address]

## 2023-12-31 ENCOUNTER — Inpatient Hospital Stay: Admission: RE | Admit: 2023-12-31 | Source: Ambulatory Visit

## 2023-12-31 ENCOUNTER — Ambulatory Visit: Admitting: Gastroenterology

## 2023-12-31 NOTE — Telephone Encounter (Signed)
 LMTRC on both numbers and pts wifes cell.   Pt missed preop ua/ucx appt this week. Secure chat from pre admit states he missed that appt also.   Does he want to proceed with surgery??    8/1- Called pt regarding his surgery for 8/4. He did his u/a and culture today.   Pt states he s/w preadmit today as well.   Ok to proceed with surg??

## 2023-12-31 NOTE — Telephone Encounter (Signed)
 Patient's wife returned call. Patient would like to proceed with surgery and will come in Friday 01/02/24 to leave preop ua/ucx at 9:15am.

## 2024-01-01 ENCOUNTER — Encounter: Payer: Self-pay | Admitting: Urology
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
## 2024-01-02 ENCOUNTER — Other Ambulatory Visit

## 2024-01-02 ENCOUNTER — Ambulatory Visit: Payer: Self-pay

## 2024-01-02 DIAGNOSIS — N138 Other obstructive and reflux uropathy: Secondary | ICD-10-CM

## 2024-01-02 DIAGNOSIS — R3989 Other symptoms and signs involving the genitourinary system: Secondary | ICD-10-CM

## 2024-01-02 LAB — MICROSCOPIC EXAMINATION: WBC, UA: >30 /HPF — AB (ref 0–5)

## 2024-01-02 LAB — URINALYSIS, COMPLETE
Bilirubin, UA: NEGATIVE
Glucose, UA: NEGATIVE
Ketones, UA: NEGATIVE
Nitrite, UA: NEGATIVE
Protein,UA: NEGATIVE
Specific Gravity, UA: 1.01 (ref 1.005–1.030)
Urobilinogen, Ur: 0.2 mg/dL (ref 0.2–1.0)
pH, UA: 6 (ref 5.0–7.5)

## 2024-01-02 MED ORDER — SULFAMETHOXAZOLE-TRIMETHOPRIM 800-160 MG PO TABS: 1.0000 | ORAL_TABLET | Freq: Two times a day (BID) | ORAL | 0 refills | Status: AC

## 2024-01-02 NOTE — Telephone Encounter (Signed)
-----   Message from Redell JAYSON Burnet sent at 01/02/2024 12:19 PM EDT ----- Regarding: Pre-Op antibiotics Please start Bactrim DS twice daily x 5 days, his surgery is Monday, thanks  Redell Burnet, MD 01/02/2024 ----- Message ----- From: Interface, [facility] Lab Results In Sent: 01/02/2024  11:36 AM EDT To: Redell JAYSON Burnet, MD

## 2024-01-02 NOTE — Telephone Encounter (Signed)
Called pt informed him of the information below. Pt voiced understanding. RX sent.  °

## 2024-01-04 MED ORDER — CHLORHEXIDINE GLUCONATE 0.12 % MT SOLN
15.0000 mL | Freq: Once | OROMUCOSAL | Status: AC
  Administered 2024-01-05: 15 mL via OROMUCOSAL

## 2024-01-04 MED ORDER — CEFAZOLIN SODIUM-DEXTROSE 2-4 GM/100ML-% IV SOLN
2.0000 g | INTRAVENOUS | Status: AC
  Administered 2024-01-05: 2 g via INTRAVENOUS

## 2024-01-04 MED ORDER — SODIUM CHLORIDE 0.9 % IV SOLN: INTRAVENOUS | Status: DC

## 2024-01-04 MED ORDER — ORAL CARE MOUTH RINSE: 15.0000 mL | Freq: Once | OROMUCOSAL | Status: AC

## 2024-01-05 ENCOUNTER — Other Ambulatory Visit: Payer: Self-pay

## 2024-01-05 ENCOUNTER — Encounter
Admission: RE | Disposition: A | Discharge status: Home / Self Care | Payer: Self-pay | Source: Home / Self Care | Attending: Urology

## 2024-01-05 ENCOUNTER — Ambulatory Visit: Payer: Self-pay | Admitting: Urgent Care

## 2024-01-05 ENCOUNTER — Encounter: Payer: Self-pay | Admitting: Urology

## 2024-01-05 ENCOUNTER — Ambulatory Visit
Admission: RE | Admit: 2024-01-05 | Discharge: 2024-01-05 | Disposition: A | Discharge status: Home / Self Care | Attending: Urology | Admitting: Urology

## 2024-01-05 DIAGNOSIS — R338 Other retention of urine: Secondary | ICD-10-CM

## 2024-01-05 DIAGNOSIS — Z951 Presence of aortocoronary bypass graft: Secondary | ICD-10-CM | POA: Insufficient documentation

## 2024-01-05 DIAGNOSIS — J439 Emphysema, unspecified: Secondary | ICD-10-CM | POA: Insufficient documentation

## 2024-01-05 DIAGNOSIS — E1142 Type 2 diabetes mellitus with diabetic polyneuropathy: Secondary | ICD-10-CM | POA: Diagnosis not present

## 2024-01-05 DIAGNOSIS — N401 Enlarged prostate with lower urinary tract symptoms: Secondary | ICD-10-CM

## 2024-01-05 DIAGNOSIS — R3914 Feeling of incomplete bladder emptying: Secondary | ICD-10-CM | POA: Diagnosis not present

## 2024-01-05 DIAGNOSIS — Z833 Family history of diabetes mellitus: Secondary | ICD-10-CM | POA: Insufficient documentation

## 2024-01-05 DIAGNOSIS — I252 Old myocardial infarction: Secondary | ICD-10-CM | POA: Insufficient documentation

## 2024-01-05 DIAGNOSIS — I5032 Chronic diastolic (congestive) heart failure: Secondary | ICD-10-CM | POA: Insufficient documentation

## 2024-01-05 DIAGNOSIS — K449 Diaphragmatic hernia without obstruction or gangrene: Secondary | ICD-10-CM | POA: Diagnosis not present

## 2024-01-05 DIAGNOSIS — Z7902 Long term (current) use of antithrombotics/antiplatelets: Secondary | ICD-10-CM | POA: Diagnosis not present

## 2024-01-05 DIAGNOSIS — K219 Gastro-esophageal reflux disease without esophagitis: Secondary | ICD-10-CM | POA: Insufficient documentation

## 2024-01-05 DIAGNOSIS — E1151 Type 2 diabetes mellitus with diabetic peripheral angiopathy without gangrene: Secondary | ICD-10-CM | POA: Diagnosis not present

## 2024-01-05 DIAGNOSIS — I110 Hypertensive heart disease with heart failure: Secondary | ICD-10-CM | POA: Diagnosis not present

## 2024-01-05 DIAGNOSIS — I2510 Atherosclerotic heart disease of native coronary artery without angina pectoris: Secondary | ICD-10-CM | POA: Insufficient documentation

## 2024-01-05 DIAGNOSIS — Z8673 Personal history of transient ischemic attack (TIA), and cerebral infarction without residual deficits: Secondary | ICD-10-CM | POA: Diagnosis not present

## 2024-01-05 DIAGNOSIS — E118 Type 2 diabetes mellitus with unspecified complications: Secondary | ICD-10-CM

## 2024-01-05 DIAGNOSIS — N138 Other obstructive and reflux uropathy: Secondary | ICD-10-CM | POA: Diagnosis present

## 2024-01-05 DIAGNOSIS — F17210 Nicotine dependence, cigarettes, uncomplicated: Secondary | ICD-10-CM | POA: Insufficient documentation

## 2024-01-05 HISTORY — PX: HOLEP-LASER ENUCLEATION OF THE PROSTATE WITH MORCELLATION: SHX6641

## 2024-01-05 HISTORY — DX: Functional disorders of polymorphonuclear neutrophils, unspecified: D71.9

## 2024-01-05 HISTORY — DX: Unspecified cirrhosis of liver: K74.60

## 2024-01-05 HISTORY — DX: Emphysema, unspecified: J43.9

## 2024-01-05 HISTORY — DX: Functional disorders of polymorphonuclear neutrophils: D71

## 2024-01-05 LAB — GLUCOSE, CAPILLARY
Glucose-Capillary: 112 mg/dL — ABNORMAL HIGH (ref 70–99)
Glucose-Capillary: 147 mg/dL — ABNORMAL HIGH (ref 70–99)

## 2024-01-05 SURGERY — ENUCLEATION, PROSTATE, USING LASER, WITH MORCELLATION
Anesthesia: General | Site: Prostate

## 2024-01-05 MED ORDER — FENTANYL CITRATE (PF) 100 MCG/2ML IJ SOLN: 25.0000 ug | INTRAMUSCULAR | Status: DC | PRN

## 2024-01-05 MED ORDER — ROCURONIUM BROMIDE 100 MG/10ML IV SOLN
INTRAVENOUS | Status: DC | PRN
Start: 2024-01-05 — End: 2024-01-05
  Administered 2024-01-05: 50 mg via INTRAVENOUS

## 2024-01-05 MED ORDER — OXYCODONE HCL 5 MG PO TABS: 5.0000 mg | ORAL_TABLET | Freq: Three times a day (TID) | ORAL | 0 refills | Status: DC | PRN

## 2024-01-05 MED ORDER — DROPERIDOL 2.5 MG/ML IJ SOLN: 0.6250 mg | Freq: Once | INTRAMUSCULAR | Status: DC | PRN

## 2024-01-05 MED ORDER — ONDANSETRON HCL 4 MG/2ML IJ SOLN
INTRAMUSCULAR | Status: AC
  Filled 2024-01-05: qty 2

## 2024-01-05 MED ORDER — SUGAMMADEX SODIUM 200 MG/2ML IV SOLN
INTRAVENOUS | Status: DC | PRN
  Administered 2024-01-05: 200 mg via INTRAVENOUS

## 2024-01-05 MED ORDER — CHLORHEXIDINE GLUCONATE 0.12 % MT SOLN
OROMUCOSAL | Status: AC
Start: 2024-01-05 — End: 2024-01-05
  Filled 2024-01-05: qty 15

## 2024-01-05 MED ORDER — PROPOFOL 10 MG/ML IV BOLUS
INTRAVENOUS | Status: DC | PRN
  Administered 2024-01-05: 120 mg via INTRAVENOUS

## 2024-01-05 MED ORDER — FENTANYL CITRATE (PF) 100 MCG/2ML IJ SOLN
INTRAMUSCULAR | Status: DC | PRN
  Administered 2024-01-05: 50 ug via INTRAVENOUS

## 2024-01-05 MED ORDER — EPHEDRINE SULFATE (PRESSORS) 50 MG/ML IJ SOLN
INTRAMUSCULAR | Status: DC | PRN
Start: 2024-01-05 — End: 2024-01-05
  Administered 2024-01-05: 10 mg via INTRAVENOUS
  Administered 2024-01-05 (×2): 5 mg via INTRAVENOUS

## 2024-01-05 MED ORDER — LIDOCAINE HCL (CARDIAC) PF 100 MG/5ML IV SOSY
PREFILLED_SYRINGE | INTRAVENOUS | Status: DC | PRN
  Administered 2024-01-05: 80 mg via INTRAVENOUS

## 2024-01-05 MED ORDER — DEXAMETHASONE SODIUM PHOSPHATE 10 MG/ML IJ SOLN
INTRAMUSCULAR | Status: DC | PRN
Start: 2024-01-05 — End: 2024-01-05
  Administered 2024-01-05: 10 mg via INTRAVENOUS

## 2024-01-05 MED ORDER — PROPOFOL 10 MG/ML IV BOLUS
INTRAVENOUS | Status: AC
  Filled 2024-01-05: qty 20

## 2024-01-05 MED ORDER — PHENYLEPHRINE HCL (PRESSORS) 10 MG/ML IV SOLN
INTRAVENOUS | Status: DC | PRN
  Administered 2024-01-05 (×2): 80 ug via INTRAVENOUS
  Administered 2024-01-05 (×2): 160 ug via INTRAVENOUS

## 2024-01-05 MED ORDER — ONDANSETRON HCL 4 MG/2ML IJ SOLN
INTRAMUSCULAR | Status: DC | PRN
  Administered 2024-01-05: 4 mg via INTRAVENOUS

## 2024-01-05 MED ORDER — SODIUM CHLORIDE 0.9 % IR SOLN
Status: DC | PRN
  Administered 2024-01-05: 15000 mL
  Administered 2024-01-05: 6000 mL

## 2024-01-05 MED ORDER — CEFAZOLIN SODIUM-DEXTROSE 2-4 GM/100ML-% IV SOLN
INTRAVENOUS | Status: AC
  Filled 2024-01-05: qty 100

## 2024-01-05 MED ORDER — FENTANYL CITRATE (PF) 100 MCG/2ML IJ SOLN
INTRAMUSCULAR | Status: AC
  Filled 2024-01-05: qty 2

## 2024-01-05 SURGICAL SUPPLY — 25 items
ADAPTER IRRIG TUBE 2 SPIKE SOL (ADAPTER) ×2 IMPLANT
BAG URO DRAIN 4000ML (MISCELLANEOUS) ×1 IMPLANT
CATH URETL OPEN END 4X70 (CATHETERS) ×1 IMPLANT
CATH URTH STD 24FR FL 3W 2 (CATHETERS) ×1 IMPLANT
CONTAINER COLLECT MORCELLATR (MISCELLANEOUS) ×1 IMPLANT
DRAPE UTILITY 15X26 TOWEL STRL (DRAPES) IMPLANT
ELECTRD BIVAP BIPO 22/24 DONUT (ELECTROSURGICAL) IMPLANT
FIBER LASER MOSES 550 DFL (Laser) ×1 IMPLANT
FILTER OVERFLOW MORCELLATOR (FILTER) ×1 IMPLANT
GLOVE BIOGEL PI IND STRL 7.5 (GLOVE) ×1 IMPLANT
GOWN STRL REUS W/ TWL LRG LVL3 (GOWN DISPOSABLE) ×1 IMPLANT
GOWN STRL REUS W/ TWL XL LVL3 (GOWN DISPOSABLE) ×1 IMPLANT
HOLDER FOLEY CATH W/STRAP (MISCELLANEOUS) ×1 IMPLANT
KIT TURNOVER CYSTO (KITS) ×1 IMPLANT
MEMBRANE SLNG YLW 17 FOR INST (MISCELLANEOUS) ×1 IMPLANT
MORCELLATOR ROTATION 4.75 335 (MISCELLANEOUS) ×1 IMPLANT
PACK CYSTO AR (MISCELLANEOUS) ×1 IMPLANT
SET CYSTO W/LG BORE CLAMP LF (SET/KITS/TRAYS/PACK) ×1 IMPLANT
SET IRRIG Y TYPE TUR BLADDER L (SET/KITS/TRAYS/PACK) ×1 IMPLANT
SLEEVE PROTECTION STRL DISP (MISCELLANEOUS) ×2 IMPLANT
SOL .9 NS 3000ML IRR UROMATIC (IV SOLUTION) ×5 IMPLANT
SURGILUBE 2OZ TUBE FLIPTOP (MISCELLANEOUS) ×1 IMPLANT
SYRINGE TOOMEY IRRIG 70ML (MISCELLANEOUS) ×1 IMPLANT
TUBE PUMP MORCELLATOR PIRANHA (TUBING) ×1 IMPLANT
WATER STERILE IRR 1000ML POUR (IV SOLUTION) ×1 IMPLANT

## 2024-01-05 NOTE — Anesthesia Procedure Notes (Signed)
 Procedure Name: Intubation Date/Time: 01/05/2024 10:44 AM  Performed by: Blair Suzen BRAVO, RNPre-anesthesia Checklist: Patient identified, Emergency Drugs available, Suction available and Patient being monitored Patient Re-evaluated:Patient Re-evaluated prior to induction Oxygen Delivery Method: Circle system utilized Preoxygenation: Pre-oxygenation with 100% oxygen Induction Type: IV induction Ventilation: Mask ventilation without difficulty and Oral airway inserted - appropriate to patient size Laryngoscope Size: Mac and 4 Grade View: Grade I Tube type: Oral Tube size: 7.5 mm Number of attempts: 1 Airway Equipment and Method: Stylet and Oral airway Placement Confirmation: ETT inserted through vocal cords under direct vision, positive ETCO2 and breath sounds checked- equal and bilateral Secured at: 20 cm Tube secured with: Tape Dental Injury: Teeth and Oropharynx as per pre-operative assessment
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
## 2024-01-05 NOTE — Anesthesia Preprocedure Evaluation (Signed)
 Anesthesia Evaluation  Patient identified by MRN, date of birth, ID band Patient awake    Reviewed: Allergy & Precautions, NPO status , Patient's Chart, lab work & pertinent test results  History of Anesthesia Complications Negative for: history of anesthetic complications  Airway Mallampati: III  TM Distance: <3 FB Neck ROM: full    Dental  (+) Poor Dentition, Missing, Dental Advidsory Given   Pulmonary neg shortness of breath, COPD, Current Smoker and Patient abstained from smoking., former smoker   Pulmonary exam normal        Cardiovascular hypertension, (-) angina + CAD, + Past MI, + CABG, + Peripheral Vascular Disease and +CHF  Normal cardiovascular exam(-) dysrhythmias      Neuro/Psych neg Seizures TIA Neuromuscular disease CVA  negative psych ROS   GI/Hepatic Neg liver ROS, hiatal hernia,GERD  Controlled and Medicated,,  Endo/Other  diabetes, Type 2    Renal/GU      Musculoskeletal   Abdominal   Peds  Hematology negative hematology ROS (+)   Anesthesia Other Findings Patient states he does not want a spinal for this case. Echo from 05/10/23:  FINDINGS   Left Ventricle: Left ventricular ejection fraction, by estimation, is 50  to 55%. The left ventricle has low normal function. The left ventricle has  no regional wall motion abnormalities. Definity  contrast agent was given  IV to delineate the left  ventricular endocardial borders. The left ventricular internal cavity size  was normal in size. There is no left ventricular hypertrophy. Left  ventricular diastolic parameters are consistent with Grade II diastolic  dysfunction (pseudonormalization).   Right Ventricle: The right ventricular size is normal. No increase in  right ventricular wall thickness. Right ventricular systolic function is  normal.   Left Atrium: Left atrial size was normal in size.   Right Atrium: Right atrial size was normal in size.    Pericardium: There is no evidence of pericardial effusion.   Mitral Valve: The mitral valve is normal in structure. Trivial mitral  valve regurgitation. No evidence of mitral valve stenosis.   Tricuspid Valve: The tricuspid valve is normal in structure. Tricuspid  valve regurgitation is trivial. No evidence of tricuspid stenosis.   Aortic Valve: The aortic valve is normal in structure. Aortic valve  regurgitation is not visualized. No aortic stenosis is present. Aortic  valve mean gradient measures 2.0 mmHg. Aortic valve peak gradient measures  3.9 mmHg. Aortic valve area, by VTI  measures 3.02 cm.   Pulmonic Valve: The pulmonic valve was normal in structure. Pulmonic valve  regurgitation is trivial. No evidence of pulmonic stenosis.   Aorta: The aortic root is normal in size and structure.   Venous: The inferior vena cava is normal in size with greater than 50%  respiratory variability, suggesting right atrial pressure of 3 mmHg.   IAS/Shunts: No atrial level shunt detected by color flow Doppler.     Past Medical History: No date: Anginal pain (HCC) No date: Arthritis No date: Carpal tunnel syndrome of right wrist No date: CHF (congestive heart failure) (HCC)     Comment:  a.)  TTE 05/08/2016: EF 55%; trivial MR, mild TR/PR;               G1DD.  b.)  TTE 11/10/2017: EF 55 to 60%; trivial MR;               apical HK; G1DD. c.)  TTE 12/13/2018: EF 50-55%;  periapical HK; mild LA enlargement. d.)  TTE 05/11/2020               EF 50 to 60%; mild MR. e.)  TTE 08/03/2021: EF >55%; triv              MR/TR, mild PR; G1DD 08/09/2009: Coronary artery disease     Comment:  a.) NSTEMI 08/09/2009 --> LHC: 30% pLAD, 100% mLAD, 30%               mLCx, 70% OM1, 20% pRCA, 20% mRCA, 30% dRCA, 100% RPDA;               no intervention. b.) LHC 08/11/2013: 100% mLAD, 90% pLCx,              75% OM1, 100% RPDA; refer to CVTS. c.) 5v CABG 08/20/2013 No date: DDD (degenerative  disc disease), lumbar No date: Diverticulosis No date: Full dentures No date: GERD (gastroesophageal reflux disease) No date: Hiatal hernia No date: HLD (hyperlipidemia) No date: HTN (hypertension) 05/10/2020: Lacunar infarct, acute (HCC) No date: Long term current use of antithrombotics/antiplatelets     Comment:  a.) dipyridamole -aspirin  No date: Lumbar radiculopathy 11/10/2017: Mural thrombus of cardiac apex     Comment:  a.) TTE 11/10/2017: small LV apex thrombus measuring 10               x 10 mm No date: Neuropathy 08/09/2009: NSTEMI (non-ST elevated myocardial infarction) [facility])     Comment:  a.) LHC 08/09/2009: 30% pLAD, 100% mLAD, 30% mLCx, 70%               OM1, 20% pRCA, 20% mRCA, 30% dRCA, 100% RPDA;               intervention deferred opting for aggressive medical               management. No date: PAD (peripheral artery disease) (HCC) 08/20/2013: S/P CABG x 5     Comment:  a.)  LIMA-LAD, SVG-diagonal-OM (sequential), SVG-RV               branch-PDA (sequential) No date: Senile cataract 05/10/2020: Type 2 diabetes mellitus with complication, without long- term current use of insulin  (HCC) No date: Wears hearing aid in both ears  Past Surgical History: 1991: ANKLE RECONSTRUCTION; Left No date: APPENDECTOMY No date: BACK SURGERY; N/A     Comment:  lower back x 3 03/2014: CARPAL TUNNEL RELEASE; Left     Comment:  In Dr. Kathlynn office 10/20/2014: CARPAL TUNNEL RELEASE; Left     Comment:  Procedure: CARPAL TUNNEL RELEASE;  Surgeon: Ozell Kathlynn, MD;  Location: [facility];  Service: Orthopedics;                Laterality: Left; No date: CATARACT EXTRACTION W/ INTRAOCULAR LENS IMPLANT; Bilateral 08/20/2013: CORONARY ARTERY BYPASS GRAFT; N/A     Comment:  Procedure: 5v CORONARY ARTERY BYPASS GRAFT; Location:               [facility]; Surgeon: Elidia Tita Mardi Braden, MD 10/26/2015: ESOPHAGOGASTRODUODENOSCOPY (EGD) WITH PROPOFOL ;  N/A     Comment:  Procedure: ESOPHAGOGASTRODUODENOSCOPY (EGD) WITH               PROPOFOL  with dialation;  Surgeon: Rogelia Copping, MD;  Location: [facility];  Service: Endoscopy;                Laterality: N/A; 08/09/2009: LEFT HEART CATH AND CORONARY ANGIOGRAPHY; Left     Comment:  Procedure: LEFT HEART CATH AND CORONARY ANGIOGRAPHY;               Location: [facility]; Surgeon: Wolm Rhyme, MD 08/11/2013: LEFT HEART CATH AND CORONARY ANGIOGRAPHY; Left     Comment:  Procedure: LEFT HEART CATH AND CORONARY ANGIOGRAPHY;               Location: [facility]; Surgeon: Vinie Jude, MD 05/06/2018: LUMBAR LAMINECTOMY/DECOMPRESSION MICRODISCECTOMY;  Bilateral     Comment:  Procedure: Bilateral Lumbar Four-Five               Laminectomy/Foraminotomy;  Surgeon: Colon Shove, MD;                Location: [facility];  Service: Neurosurgery;  Laterality:               Bilateral;  Bilateral Lumbar 4-5 Laminectomy/Foraminotomy No date: NECK SURGERY; N/A     Comment:  neck x 2, one discectomy and one shaved disc. 06/06/2015: SHOULDER ARTHROSCOPY; Right     Comment:  Procedure: right shoulder arthroscopy, arthroscopic               debridement, subacromial decompression, SLAP repair,               biotenodesis, mini open rotator cuff repair;  Surgeon:               Norleen JINNY Maltos, MD;  Location: [facility];  Service:               Orthopedics;  Laterality: Right; No date: SHOULDER OPEN ROTATOR CUFF REPAIR; Left  BMI    Body Mass Index: 29.95 kg/m      Reproductive/Obstetrics negative OB ROS                              Anesthesia Physical Anesthesia Plan  ASA: 3  Anesthesia Plan: General   Post-op Pain Management:    Induction: Intravenous  PONV Risk Score and Plan: 2 and Ondansetron  and Dexamethasone   Airway Management Planned: Oral ETT and LMA  Additional Equipment:   Intra-op Plan:   Post-operative Plan: Extubation in OR  Informed Consent: I have  reviewed the patients History and Physical, chart, labs and discussed the procedure including the risks, benefits and alternatives for the proposed anesthesia with the patient or authorized representative who has indicated his/her understanding and acceptance.     Dental Advisory Given  Plan Discussed with: Anesthesiologist, CRNA and Surgeon  Anesthesia Plan Comments: (Patient consented for risks of anesthesia including but not limited to:  - adverse reactions to medications - damage to eyes, teeth, lips or other oral mucosa - nerve damage due to positioning  - sore throat or hoarseness - Damage to heart, brain, nerves, lungs, other parts of body or loss of life  Patient voiced understanding and assent.)        Anesthesia Quick Evaluation

## 2024-01-05 NOTE — Progress Notes (Signed)
 Catheter teaching provided to patient and spouse. Patient and spouse refused large catheter drainage bag. Leg bag applied and catheter irrigation taught, both voiced understanding. Patient d/c home with spouse.

## 2024-01-05 NOTE — Transfer of Care (Signed)
 Immediate Anesthesia Transfer of Care Note  Patient: [name] [name]  Procedure(s) Performed: ENUCLEATION, PROSTATE, USING LASER, WITH MORCELLATION (Prostate)  Patient Location: PACU  Anesthesia Type:General  Level of Consciousness: awake, alert , and oriented  Airway & Oxygen Therapy: Patient Spontanous Breathing and Patient connected to face mask oxygen  Post-op Assessment: Report given to RN and Post -op Vital signs reviewed and stable  Post vital signs: stable  Last Vitals:  Vitals Value Taken Time  BP 118/54 01/05/24 11:45  Temp    Pulse 64 01/05/24 11:46  Resp 15 01/05/24 11:46  SpO2 98 % 01/05/24 11:46  Vitals shown include unfiled device data.  Last Pain:  Vitals:   01/05/24 0749  TempSrc: Temporal  PainSc: 0-No pain         Complications: No notable events documented.

## 2024-01-05 NOTE — Op Note (Signed)
 Date of procedure: 01/05/24  Preoperative diagnosis:  BPH with retention  Postoperative diagnosis:  Same  Procedure: HoLEP (Holmium Laser Enucleation of the Prostate)  Surgeon: Redell Burnet, MD  Anesthesia: General  Complications: None  Intraoperative findings:  Small, tight, fibrous prostate, moderate to severe bladder trabeculations Uncomplicated HOLEP, excellent hemostasis, ureteral orifices and verumontanum intact at conclusion of case  EBL: Minimal  Specimens: Prostate chips  Enucleation time: 11 minutes  Morcellation time: 12 minutes  Intra-op weight: 20g  Drains: 24 French three-way, 50 cc in balloon  Indication: [name] [name] is a 78 y.o. patient with BPH and overflow incontinence who opted for HOLEP.  After reviewing the management options for treatment, they elected to proceed with the above surgical procedure(s). We have discussed the potential benefits and risks of the procedure, side effects of the proposed treatment, the likelihood of the patient achieving the goals of the procedure, and any potential problems that might occur during the procedure or recuperation.  We specifically discussed the risks of bleeding, infection, hematuria and clot retention, need for additional procedures, possible overnight hospital stay, temporary urgency and incontinence, rare long-term incontinence, and retrograde ejaculation.  Informed consent has been obtained.   Description of procedure:  The patient was taken to the operating room and general anesthesia was induced.  The patient was placed in the dorsal lithotomy position, prepped and draped in the usual sterile fashion, and preoperative antibiotics were administered.  SCDs were placed for DVT prophylaxis.  A preoperative time-out was performed.   Fleeta Needs sounds were used to gently dilated the urethra up to 32F. The 43 French continuous flow resectoscope was inserted into the urethra using the visual obturator  The  prostate was short and tight. The bladder was thoroughly inspected and notable for moderate to severe trabeculations but no suspicious lesions.  The ureteral orifices were located in orthotopic position.    The laser was set to 2 J and 60 Hz and early apical release was performed by making a circumferential mucosal incision proximal to the sphincter.  A lambda incision was then made proximal to the verumontanum.  The prostate was enucleated en bloc circumferentially into the bladder.  The capsule was examined and laser was used for meticulous hemostasis.    The 58 French resectoscope was then switched out for the 26 French nephroscope and prostate tissue was morcellated(Piranha) and the tissue sent to pathology.  There was a 2 cm spherical piece of tissue beach ball that could not be morcellated and a bipolar resectoscope was used to cut this down to smaller pieces that were then evacuated from the scope.  A 24 French three-way catheter was inserted easily with the aid of a catheter guide, and 50 cc were placed in the balloon.  Urine was clear.  The catheter irrigated easily with a Toomey syringe.  CBI was initiated.   The patient tolerated the procedure well without any immediate complications and was extubated and transferred to the recovery room in stable condition.  Urine was clear on fast CBI.  Disposition: Stable to PACU  Plan: Wean CBI in PACU, anticipate discharge home today with Foley removal in clinic in 2-3 days  Redell Burnet, MD 01/05/2024

## 2024-01-06 ENCOUNTER — Ambulatory Visit: Payer: Self-pay | Admitting: Urology

## 2024-01-06 ENCOUNTER — Encounter: Payer: Self-pay | Admitting: Urology

## 2024-01-06 LAB — SURGICAL PATHOLOGY

## 2024-01-06 NOTE — Progress Notes (Unsigned)
    Patient underwent HoLEP with Dr. Francisca on 01/05/2024.  His postoperative course has been as expected and uneventful.   Reviewed post operative course following HoLEP of temporary worsening of irritative voiding symptoms, SUI and retrograde ejaculation.  Surgical pathology was negative for malignancy.    Patient is present today for a catheter removal.  50 ml of water  was drained from the balloon. A 24 FR 3- way foley cath was removed from the bladder no complications were noted . Patient tolerated well.  Performed by: CLOTILDA CORNWALL, PA-C   Follow up/ Additional notes: Follow up on December 3rd with Dr. Francisca

## 2024-01-07 ENCOUNTER — Telehealth: Payer: Self-pay

## 2024-01-07 LAB — CULTURE, URINE COMPREHENSIVE

## 2024-01-07 NOTE — Anesthesia Postprocedure Evaluation (Signed)
 Anesthesia Post Note  Patient: [name] [name]  Procedure(s) Performed: ENUCLEATION, PROSTATE, USING LASER, WITH MORCELLATION (Prostate)  Patient location during evaluation: PACU Anesthesia Type: General Level of consciousness: awake and alert Pain management: pain level controlled Vital Signs Assessment: post-procedure vital signs reviewed and stable Respiratory status: spontaneous breathing, nonlabored ventilation, respiratory function stable and patient connected to nasal cannula oxygen Cardiovascular status: blood pressure returned to baseline and stable Postop Assessment: no apparent nausea or vomiting Anesthetic complications: no   No notable events documented.   Last Vitals:  Vitals:   01/05/24 1215 01/05/24 1245  BP: (!) 118/57 (!) 136/53  Pulse: (!) 57 (!) 53  Resp: 12 15  Temp:  (!) 36.2 C  SpO2: 94% 99%    Last Pain:  Vitals:   01/05/24 1245  TempSrc: Temporal  PainSc: 2                  Prentice Murphy

## 2024-01-07 NOTE — Telephone Encounter (Signed)
 Patients wife called and stated patient wanted to know if having some blood in his urine after having the HOLEP procedure was still normal. Patient does have a catheter still in and scheduled tomorrow for removal. Patient states there are no clots. Just blood tinged urine. Patient was encouraged to drink fluids but patient states discomfort with drinking fluids. I let them know that the catheter can also irritate and cause bladder spasms. Patient seemed fine with keeping F/U for tomorrow.

## 2024-01-08 ENCOUNTER — Ambulatory Visit (INDEPENDENT_AMBULATORY_CARE_PROVIDER_SITE_OTHER): Admitting: Urology

## 2024-01-08 VITALS — BP 112/75 | HR 85 | Ht 68.0 in

## 2024-01-08 DIAGNOSIS — R3914 Feeling of incomplete bladder emptying: Secondary | ICD-10-CM

## 2024-01-08 DIAGNOSIS — N401 Enlarged prostate with lower urinary tract symptoms: Secondary | ICD-10-CM

## 2024-01-08 NOTE — Patient Instructions (Signed)

## 2024-02-24 ENCOUNTER — Emergency Department
Admission: EM | Admit: 2024-02-24 | Discharge: 2024-02-24 | Disposition: A | Discharge status: Home / Self Care | Attending: Emergency Medicine | Admitting: Emergency Medicine

## 2024-02-24 ENCOUNTER — Other Ambulatory Visit: Payer: Self-pay

## 2024-02-24 ENCOUNTER — Ambulatory Visit: Admitting: Gastroenterology

## 2024-02-24 DIAGNOSIS — I110 Hypertensive heart disease with heart failure: Secondary | ICD-10-CM | POA: Diagnosis not present

## 2024-02-24 DIAGNOSIS — N3091 Cystitis, unspecified with hematuria: Secondary | ICD-10-CM | POA: Diagnosis not present

## 2024-02-24 DIAGNOSIS — I509 Heart failure, unspecified: Secondary | ICD-10-CM | POA: Diagnosis not present

## 2024-02-24 DIAGNOSIS — Z96642 Presence of left artificial hip joint: Secondary | ICD-10-CM | POA: Diagnosis not present

## 2024-02-24 DIAGNOSIS — E1142 Type 2 diabetes mellitus with diabetic polyneuropathy: Secondary | ICD-10-CM | POA: Diagnosis not present

## 2024-02-24 DIAGNOSIS — I2510 Atherosclerotic heart disease of native coronary artery without angina pectoris: Secondary | ICD-10-CM | POA: Insufficient documentation

## 2024-02-24 DIAGNOSIS — Z951 Presence of aortocoronary bypass graft: Secondary | ICD-10-CM | POA: Diagnosis not present

## 2024-02-24 DIAGNOSIS — R339 Retention of urine, unspecified: Secondary | ICD-10-CM | POA: Diagnosis present

## 2024-02-24 DIAGNOSIS — N3090 Cystitis, unspecified without hematuria: Secondary | ICD-10-CM

## 2024-02-24 DIAGNOSIS — R319 Hematuria, unspecified: Secondary | ICD-10-CM

## 2024-02-24 LAB — CBC WITH DIFFERENTIAL/PLATELET
Abs Immature Granulocytes: 0.04 K/uL (ref 0.00–0.07)
Basophils Absolute: 0.1 K/uL (ref 0.0–0.1)
Basophils Relative: 1 %
Eosinophils Absolute: 0.1 K/uL (ref 0.0–0.5)
Eosinophils Relative: 1 %
HCT: 31.9 % — ABNORMAL LOW (ref 39.0–52.0)
Hemoglobin: 10.8 g/dL — ABNORMAL LOW (ref 13.0–17.0)
Immature Granulocytes: 1 %
Lymphocytes Relative: 23 %
Lymphs Abs: 2 K/uL (ref 0.7–4.0)
MCH: 30.3 pg (ref 26.0–34.0)
MCHC: 33.9 g/dL (ref 30.0–36.0)
MCV: 89.4 fL (ref 80.0–100.0)
Monocytes Absolute: 0.8 K/uL (ref 0.1–1.0)
Monocytes Relative: 10 %
Neutro Abs: 5.5 K/uL (ref 1.7–7.7)
Neutrophils Relative %: 64 %
Platelets: 215 K/uL (ref 150–400)
RBC: 3.57 MIL/uL — ABNORMAL LOW (ref 4.22–5.81)
RDW: 14.2 % (ref 11.5–15.5)
Smear Review: NORMAL
WBC: 8.5 K/uL (ref 4.0–10.5)
nRBC: 0 % (ref 0.0–0.2)

## 2024-02-24 LAB — URINALYSIS, W/ REFLEX TO CULTURE (INFECTION SUSPECTED)
Bacteria, UA: NONE SEEN
RBC / HPF: >50 RBC/hpf (ref 0–5)
Squamous Epithelial / HPF: 0 /HPF (ref 0–5)
WBC, UA: >50 WBC/hpf (ref 0–5)

## 2024-02-24 LAB — BASIC METABOLIC PANEL WITH GFR
Anion gap: 8 (ref 5–15)
BUN: 19 mg/dL (ref 8–23)
CO2: 25 mmol/L (ref 22–32)
Calcium: 9.4 mg/dL (ref 8.9–10.3)
Chloride: 105 mmol/L (ref 98–111)
Creatinine, Ser: 1.03 mg/dL (ref 0.61–1.24)
GFR, Estimated: >60 mL/min (ref >60)
Glucose, Bld: 162 mg/dL — ABNORMAL HIGH (ref 70–99)
Potassium: 3.7 mmol/L (ref 3.5–5.1)
Sodium: 138 mmol/L (ref 135–145)

## 2024-02-24 MED ORDER — CIPROFLOXACIN HCL 500 MG PO TABS: 500.0000 mg | ORAL_TABLET | Freq: Two times a day (BID) | ORAL | 0 refills | Status: DC

## 2024-02-24 NOTE — ED Triage Notes (Signed)
 Pt arrived from home via EMS d/t urinary rentention for the past 24 hours. Pt has hx of urinary difficulties.

## 2024-02-24 NOTE — Discharge Instructions (Signed)
 Your lab tests show a bladder infection. Your kidney function and other tests are okay. Please start taking antibiotics and make a follow up appointment with Dr. Marval office.
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
## 2024-02-25 ENCOUNTER — Encounter: Payer: Self-pay | Admitting: Urology

## 2024-02-25 ENCOUNTER — Telehealth: Payer: Self-pay

## 2024-02-25 ENCOUNTER — Ambulatory Visit: Admitting: Urology

## 2024-02-25 VITALS — BP 116/65 | HR 77

## 2024-02-25 DIAGNOSIS — R3914 Feeling of incomplete bladder emptying: Secondary | ICD-10-CM | POA: Diagnosis not present

## 2024-02-25 DIAGNOSIS — N401 Enlarged prostate with lower urinary tract symptoms: Secondary | ICD-10-CM

## 2024-02-25 DIAGNOSIS — R310 Gross hematuria: Secondary | ICD-10-CM

## 2024-02-25 DIAGNOSIS — R3989 Other symptoms and signs involving the genitourinary system: Secondary | ICD-10-CM

## 2024-02-25 LAB — URINE CULTURE: Culture: NO GROWTH

## 2024-02-25 LAB — BLADDER SCAN AMB NON-IMAGING: Scan Result: 107

## 2024-02-25 MED ORDER — CEFTRIAXONE SODIUM 1 G IJ SOLR
1.0000 g | Freq: Once | INTRAMUSCULAR | Status: AC
  Administered 2024-02-25: 1 g via INTRAMUSCULAR

## 2024-02-25 NOTE — Telephone Encounter (Signed)
 Returned pts call. Pt has not urinated since last night. Pt was seen in ED yesterday and put on Cipro . After talking with PA we asked patient to come in so we can scan him and treat him here in the clinic. Pts wife agreed to bring him in.
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
## 2024-02-25 NOTE — Progress Notes (Signed)
 Simple Catheter Placement  Due to urinary retention patient is present today for a foley cath placement.  Patient was cleaned and prepped in a sterile fashion with betadine and 2% lidocaine  jelly was instilled into the urethra. A 18 FR foley catheter was inserted, urine return was noted  50ml, urine was brown/red in color.  The balloon was filled with 10cc of sterile water .  A Overnight bag was attached for drainage.  Patient tolerated well, no complications were noted   Performed by: Mathew Pinal, RN  Additional notes/ Follow up: No follow-ups on file.

## 2024-02-26 NOTE — Progress Notes (Signed)
 Established Patient Visit   Chief Complaint: Chief Complaint  Patient presents with  . Hospital Follow Up   Date of Service: 02/26/2024 Date of Birth: [birth year] PCP: Sadie Tamra Cal, MD  History of Present Illness: Mr. Maul is a 78 y.o.male patient with CAD, HTN, and HLD who is here for a follow-up visit.  History of Present Illness DIEZEL MAZUR is a 78 year old male with coronary artery disease who presents for medication management post-bypass surgery.  He has difficulty swallowing large capsules, which is problematic with his current medication, Agrenox. He is considering alternatives due to this difficulty.  He is currently taking Repatha for cholesterol management and has been on Agrenox since his bypass surgery. He recalls being on a baby aspirin  in 2020, followed by aspirin  325 mg, before being switched to Agrenox.  No dizziness, lightheadedness, or recent chest pain.   Past Medical and Surgical History  Past Medical History Past Medical History:  Diagnosis Date  . Allergy    Parafon Forte, plavix , augmenten  . Anemia 01/20/2024  . CAD (coronary artery disease) 08/19/2013   MI in 08/2009  . Cataract cortical, senile   . Cerebrovascular accident (CMS/HHS-HCC) 05/10/2020  . CHF (congestive heart failure) (CMS/HHS-HCC) 08/19/2013  . 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 GERD (gastroesophageal reflux disease) 08/19/2013  . Hearing impaired 08/19/2013   Profound hearing loss in right ear, wears hearing aide in left ear  . Hyperlipidemia 08/19/2013  . Hypertension 08/19/2013  . Lumbar radiculopathy 08/19/2013  . Myocardial infarction (CMS/HHS-HCC)   . Neck pain   . Osteoarthritis   . Stroke (CMS/HHS-HCC) 11/09/2017  . Type 2 diabetes mellitus with complication, without long-term current use of insulin  (CMS/HHS-HCC) 05/10/2020  . Type 2 diabetes mellitus with complication, without long-term current use of insulin   (CMS/HHS-HCC) 05/10/2020    Past Surgical History He has a past surgical history that includes Microdiscectomy lumbar; Posterior Lumbar Spine Fusion One Level; Anterior fusion cervical spine; ORIF ankle fracture (Left); Laparoscopic cholecystectomy (2010); Cataract extraction (Bilateral); coronary artery bypass w/single artery graft (N/A, 08/20/2013); coronary artery bypass w/venous & arterial grafts (N/A, 08/20/2013); transesophageal echocardiography (N/A, 08/20/2013); colonoscopy (03/30/2009); egd (05/21/2010); Endoscopic Carpal Tunnel Release (Left, 10/20/2014); Impingement/tendinopathy with full-thickness rotator cuff tear, biceps tendinopathy, and SLAP tear, right shoulder  (Right, 06/06/2015); Coronary artery bypass graft (08/19/2013); Cholecystectomy; back surgery (05/06/2018); Left Long and Ring Trigger Finger Release (Left, 05/29/2018); Knee arthroscopy (Right, 08/07/2021); excision of epidermal cyst (01/01/2023); and arthroplasty hip total (Left, 07/24/2023).   Medications and Allergies  Current Medications  Current Outpatient Medications on File Prior to Visit  Medication Sig Dispense Refill  . blood glucose diagnostic test strip 1 each (1 strip total) once daily Use as instructed. 100 each 3  . blood glucose meter kit as directed Use to monitor blood sugar once daily as directed. Please dispense preferred product based on patient's insurance. Test strips + Lancets 1 each 0  . blood glucose meter kit as directed 1 each 0  . ciprofloxacin  HCl (CIPRO ) 500 MG tablet Take 500 mg by mouth 2 (two) times daily    . clotrimazole-betamethasone  (LOTRISONE) 1-0.05 % cream Apply topically 2 (two) times daily To left foot but not between toes 30 g 3  . cyanocobalamin (VITAMIN B12) 1000 MCG tablet Take 1 tablet (1,000 mcg total) by mouth once daily 90 tablet 1  . cyclobenzaprine  (FLEXERIL ) 10 MG tablet TAKE 1 TABLET BY MOUTH 3 TIMES DAILY AS  NEEDED FOR MUSCLE SPASMS 90 tablet 1  . dicyclomine  (BENTYL )  10 mg capsule TAKE 1 CAPSULE BY MOUTH TWICE DAILY 60 capsule 1  . docusate (COLACE) 100 MG capsule Take 100 mg by mouth at bedtime    . finasteride (PROSCAR) 5 mg tablet Take 1 tablet (5 mg total) by mouth once daily 90 tablet 1  . gabapentin  (NEURONTIN ) 400 MG capsule Take 1 capsule (400 mg total) by mouth 3 (three) times daily 90 capsule 5  . glimepiride  (AMARYL ) 4 MG tablet Take 1 tablet (4 mg total) by mouth daily with breakfast 90 tablet 1  . lancing device with lancets kit Use 1 each once daily Use as instructed. 100 each 3  . oxyCODONE  (OXYIR) 10 mg immediate release tablet Take 1 tablet (10 mg total) by mouth 2 (two) times daily as needed 60 tablet 0  . pantoprazole  (PROTONIX ) 40 MG DR tablet Take 1 tablet (40 mg total) by mouth 2 (two) times daily before meals 180 tablet 1  . polyethylene glycol (MIRALAX ) powder Take 17 g by mouth once daily Mix in 4-8ounces of fluid prior to taking. Take 2-3 times weekly    . REPATHA SURECLICK 140 mg/mL PnIj INJECT 140MG  SUBCUTANEOUSLY EVERY 14 DAYS 2 mL 11  . semaglutide  (OZEMPIC ) 0.25 mg or 0.5 mg (2 mg/3 mL) pen injector Inject 0.375 mLs (0.25 mg total) subcutaneously once a week 3 mL 3  . tamsulosin  (FLOMAX ) 0.4 mg capsule Take 2 capsules (0.8 mg total) by mouth once daily Take 30 minutes after same meal each day. 180 capsule 1   No current facility-administered medications on file prior to visit.    Allergies: Statins-hmg-coa reductase inhibitors, Amoxicillin-pot clavulanate, Cucumber fruit extract, Midodrine  hcl, Parafon forte [chlorzoxazone], and Clopidogrel  bisulfate  Social and Family History  Social History  reports that he has been smoking cigarettes. He started smoking about 60 years ago. He has been exposed to tobacco smoke. He has never used smokeless tobacco. He reports that he does not drink alcohol and does not use drugs.  Family History Family History  Problem Relation Name Age of Onset  . Liver disease Mother    . Lung cancer  Father    . No Known Problems Sister Elder   . No Known Problems Sister Meade   . No Known Problems Sister Orlean   . No Known Problems Daughter Roxie   . No Known Problems Sister Venus   . No Known Problems Sister Nena   . Anesthesia problems Neg Hx    . Malignant hyperthermia Neg Hx      Review of Systems   Review of Systems  Positive for heel spur   Negative for weight gain weight loss, weakness, vision change, hearing loss, cough, congestion, PND, orthopnea, heartburn, nausea, diaphoresis, vomiting, diarrhea, bloody stool, melena, stomach pain,   leg weakness, leg cramping, leg blood clots, headache, blackouts, nosebleed, trouble swallowing, mouth pain, urinary frequency, urination at night, muscle weakness, skin lesions, skin rashes, tingling ,ulcers, numbness, anxiety, and/or depression Physical Examination   Vitals:BP 98/58 (BP Location: Right upper arm, Patient Position: Sitting)   Pulse 73   Ht 175.3 cm (5' 9)   Wt (!) 0.454 kg (1 lb)   SpO2 99%   BMI 0.15 kg/m  Ht:175.3 cm (5' 9) Wt:(!) 0.454 kg (1 lb) ADJ:Anib surface area is 0.15 meters squared. Body mass index is 0.15 kg/m. Appearance: well appearing in no acute distress HEENT: Pupils equally reactive to light and accomodation, no xanthalasma  Neck: Supple, no apparent thyromegaly, masses, or lymphadenopathy  Lungs: normal respiratory effort; no crackles, no rhonchi, no wheezes Heart: Regular rate and rhythm. Normal S1 S2  No gallops, murmur, no rub, PMI is normal size and placement. carotid upstroke normal without bruit. Jugular venous pressure is normal Abdomen: soft, nontender, not distended with normal bowel sounds. No apparent hepatosplenomegally. Abdominal aorta is normal size without bruit Extremities: no edema, no ulcers, no clubbing, no cyanosis Peripheral Pulses: 2+ in upper extremities, 2+ femoral pulses bilaterally, 2+lower extremity  Musculoskeletal;  Normal muscle tone without  kyphosis Neurological:   Oriented and Alert, Cranial nerves intact  Assessment   78 y.o. male with  Encounter Diagnoses  Name Primary?  . Coronary artery disease involving native coronary artery of native heart without angina pectoris Yes  . Pure hypercholesterolemia   . Primary hypertension   . Bradycardia       12/16/2022 EKG: normal sinus rhythm, rate 56 bpm. Low voltage. Non-specific ST abnormality. No active ischemia    Plan   Primary hypertension Continue current cardiac medications. Encourage low-sodium diet, less than 2000 mg daily. BP very well controlled on current regiment   No orders of the defined types were placed in this encounter.  Assessment & Plan Coronary artery disease of native coronary artery without angina Previously on Aggrenox , likely post-bypass operation, to prevent blood clots. Long-term use rationale unclear. No current evidence of angina or acute coronary symptoms. - Discontinue Aggrenox . - Initiate baby aspirin  81 mg daily.  Pure hypercholesterolemia Continues Repatha for cholesterol management.   Return in about 6 months (around 08/25/2024).     SABINA CUSTOVIC, DO

## 2024-02-27 ENCOUNTER — Telehealth: Payer: Self-pay | Admitting: Urology

## 2024-02-27 NOTE — Telephone Encounter (Signed)
 Notified patient wife  as instructed, patient wife states he is doing better . But he did fall yesterday  but he would not go to ed .

## 2024-02-27 NOTE — Telephone Encounter (Signed)
 Would you let [name] [name] know that his urine culture from the emergency department was negative for infection.  If he is feeling better, I would still complete the Cipro .  If he is not feeling better, please let me know.

## 2024-03-02 ENCOUNTER — Emergency Department

## 2024-03-02 ENCOUNTER — Other Ambulatory Visit: Payer: Self-pay

## 2024-03-02 ENCOUNTER — Observation Stay
Admission: EM | Admit: 2024-03-02 | Discharge: 2024-03-03 | Disposition: A | Discharge status: Home / Self Care | Attending: Internal Medicine | Admitting: Internal Medicine

## 2024-03-02 ENCOUNTER — Encounter: Payer: Self-pay | Admitting: Emergency Medicine

## 2024-03-02 DIAGNOSIS — I2510 Atherosclerotic heart disease of native coronary artery without angina pectoris: Secondary | ICD-10-CM | POA: Diagnosis not present

## 2024-03-02 DIAGNOSIS — G894 Chronic pain syndrome: Secondary | ICD-10-CM | POA: Diagnosis not present

## 2024-03-02 DIAGNOSIS — R2990 Unspecified symptoms and signs involving the nervous system: Secondary | ICD-10-CM | POA: Diagnosis not present

## 2024-03-02 DIAGNOSIS — Z8673 Personal history of transient ischemic attack (TIA), and cerebral infarction without residual deficits: Secondary | ICD-10-CM | POA: Diagnosis not present

## 2024-03-02 DIAGNOSIS — G459 Transient cerebral ischemic attack, unspecified: Secondary | ICD-10-CM | POA: Diagnosis not present

## 2024-03-02 DIAGNOSIS — Z7901 Long term (current) use of anticoagulants: Secondary | ICD-10-CM | POA: Insufficient documentation

## 2024-03-02 DIAGNOSIS — I639 Cerebral infarction, unspecified: Secondary | ICD-10-CM

## 2024-03-02 DIAGNOSIS — Z79899 Other long term (current) drug therapy: Secondary | ICD-10-CM | POA: Insufficient documentation

## 2024-03-02 DIAGNOSIS — M6281 Muscle weakness (generalized): Secondary | ICD-10-CM | POA: Insufficient documentation

## 2024-03-02 DIAGNOSIS — F17210 Nicotine dependence, cigarettes, uncomplicated: Secondary | ICD-10-CM | POA: Insufficient documentation

## 2024-03-02 DIAGNOSIS — R531 Weakness: Principal | ICD-10-CM

## 2024-03-02 DIAGNOSIS — E7800 Pure hypercholesterolemia, unspecified: Secondary | ICD-10-CM | POA: Insufficient documentation

## 2024-03-02 DIAGNOSIS — Z7982 Long term (current) use of aspirin: Secondary | ICD-10-CM | POA: Insufficient documentation

## 2024-03-02 DIAGNOSIS — R262 Difficulty in walking, not elsewhere classified: Secondary | ICD-10-CM | POA: Insufficient documentation

## 2024-03-02 LAB — DIFFERENTIAL
Abs Immature Granulocytes: 0.06 K/uL (ref 0.00–0.07)
Basophils Absolute: 0 K/uL (ref 0.0–0.1)
Basophils Relative: 1 %
Eosinophils Absolute: 0.1 K/uL (ref 0.0–0.5)
Eosinophils Relative: 2 %
Immature Granulocytes: 1 %
Lymphocytes Relative: 45 %
Lymphs Abs: 2.8 K/uL (ref 0.7–4.0)
Monocytes Absolute: 0.4 K/uL (ref 0.1–1.0)
Monocytes Relative: 7 %
Neutro Abs: 2.8 K/uL (ref 1.7–7.7)
Neutrophils Relative %: 44 %

## 2024-03-02 LAB — CBC
HCT: 30.5 % — ABNORMAL LOW (ref 39.0–52.0)
Hemoglobin: 10.3 g/dL — ABNORMAL LOW (ref 13.0–17.0)
MCH: 29.5 pg (ref 26.0–34.0)
MCHC: 33.8 g/dL (ref 30.0–36.0)
MCV: 87.4 fL (ref 80.0–100.0)
Platelets: 261 K/uL (ref 150–400)
RBC: 3.49 MIL/uL — ABNORMAL LOW (ref 4.22–5.81)
RDW: 14.2 % (ref 11.5–15.5)
WBC: 6.2 K/uL (ref 4.0–10.5)
nRBC: 0 % (ref 0.0–0.2)

## 2024-03-02 LAB — COMPREHENSIVE METABOLIC PANEL WITH GFR
ALT: 15 U/L (ref 0–44)
AST: 17 U/L (ref 15–41)
Albumin: 3.4 g/dL — ABNORMAL LOW (ref 3.5–5.0)
Alkaline Phosphatase: 85 U/L (ref 38–126)
Anion gap: 11 (ref 5–15)
BUN: 15 mg/dL (ref 8–23)
CO2: 26 mmol/L (ref 22–32)
Calcium: 9.2 mg/dL (ref 8.9–10.3)
Chloride: 102 mmol/L (ref 98–111)
Creatinine, Ser: 0.79 mg/dL (ref 0.61–1.24)
GFR, Estimated: >60 mL/min (ref >60)
Glucose, Bld: 155 mg/dL — ABNORMAL HIGH (ref 70–99)
Potassium: 3.7 mmol/L (ref 3.5–5.1)
Sodium: 139 mmol/L (ref 135–145)
Total Bilirubin: 0.6 mg/dL (ref 0.0–1.2)
Total Protein: 6.8 g/dL (ref 6.5–8.1)

## 2024-03-02 LAB — GLUCOSE, CAPILLARY: Glucose-Capillary: 98 mg/dL (ref 70–99)

## 2024-03-02 LAB — ETHANOL: Alcohol, Ethyl (B): <15 mg/dL (ref <15)

## 2024-03-02 LAB — PROTIME-INR
INR: 1 (ref 0.8–1.2)
Prothrombin Time: 14.1 s (ref 11.4–15.2)

## 2024-03-02 LAB — APTT: aPTT: 29 s (ref 24–36)

## 2024-03-02 MED ORDER — PANTOPRAZOLE SODIUM 40 MG PO TBEC
40.0000 mg | DELAYED_RELEASE_TABLET | Freq: Every morning | ORAL | Status: DC
  Administered 2024-03-03: 40 mg via ORAL
  Filled 2024-03-02: qty 1

## 2024-03-02 MED ORDER — CYCLOBENZAPRINE HCL 10 MG PO TABS
20.0000 mg | ORAL_TABLET | Freq: Every day | ORAL | Status: DC
  Administered 2024-03-02: 20 mg via ORAL
  Filled 2024-03-02: qty 2

## 2024-03-02 MED ORDER — INSULIN ASPART 100 UNIT/ML IJ SOLN
0.0000 [IU] | Freq: Three times a day (TID) | INTRAMUSCULAR | Status: DC
  Administered 2024-03-03: 3 [IU] via SUBCUTANEOUS
  Administered 2024-03-03: 2 [IU] via SUBCUTANEOUS
  Filled 2024-03-02 (×2): qty 1

## 2024-03-02 MED ORDER — ACETAMINOPHEN 650 MG RE SUPP: 650.0000 mg | RECTAL | Status: DC | PRN

## 2024-03-02 MED ORDER — DICYCLOMINE HCL 10 MG PO CAPS
10.0000 mg | ORAL_CAPSULE | Freq: Two times a day (BID) | ORAL | Status: DC
  Administered 2024-03-02 – 2024-03-03 (×2): 10 mg via ORAL
  Filled 2024-03-02 (×2): qty 1

## 2024-03-02 MED ORDER — ACETAMINOPHEN 325 MG PO TABS
650.0000 mg | ORAL_TABLET | ORAL | Status: DC | PRN
  Administered 2024-03-03: 650 mg via ORAL
  Filled 2024-03-02: qty 2

## 2024-03-02 MED ORDER — CYCLOBENZAPRINE HCL 10 MG PO TABS
10.0000 mg | ORAL_TABLET | Freq: Every day | ORAL | Status: DC
  Administered 2024-03-03: 10 mg via ORAL
  Filled 2024-03-02: qty 1

## 2024-03-02 MED ORDER — GABAPENTIN 400 MG PO CAPS
400.0000 mg | ORAL_CAPSULE | Freq: Two times a day (BID) | ORAL | Status: DC
Start: 2024-03-02 — End: 2024-03-03
  Administered 2024-03-02 – 2024-03-03 (×2): 400 mg via ORAL
  Filled 2024-03-02 (×2): qty 1

## 2024-03-02 MED ORDER — INSULIN ASPART 100 UNIT/ML IJ SOLN: 0.0000 [IU] | Freq: Every day | INTRAMUSCULAR | Status: DC

## 2024-03-02 MED ORDER — ASPIRIN 81 MG PO TBEC
81.0000 mg | DELAYED_RELEASE_TABLET | Freq: Every day | ORAL | Status: DC
  Administered 2024-03-03: 81 mg via ORAL
  Filled 2024-03-02: qty 1

## 2024-03-02 MED ORDER — TAMSULOSIN HCL 0.4 MG PO CAPS
0.8000 mg | ORAL_CAPSULE | Freq: Every day | ORAL | Status: DC
  Administered 2024-03-02 – 2024-03-03 (×2): 0.8 mg via ORAL
  Filled 2024-03-02 (×2): qty 2

## 2024-03-02 MED ORDER — DOCUSATE SODIUM 100 MG PO CAPS
100.0000 mg | ORAL_CAPSULE | Freq: Every evening | ORAL | Status: DC
  Administered 2024-03-02 – 2024-03-03 (×2): 100 mg via ORAL
  Filled 2024-03-02 (×2): qty 1

## 2024-03-02 MED ORDER — ACETAMINOPHEN 160 MG/5ML PO SOLN: 650.0000 mg | ORAL | Status: DC | PRN

## 2024-03-02 MED ORDER — ASPIRIN 81 MG PO CHEW
324.0000 mg | CHEWABLE_TABLET | Freq: Once | ORAL | Status: AC
  Administered 2024-03-02: 324 mg via ORAL
  Filled 2024-03-02: qty 4

## 2024-03-02 MED ORDER — HYDRALAZINE HCL 20 MG/ML IJ SOLN: 5.0000 mg | Freq: Four times a day (QID) | INTRAMUSCULAR | Status: DC | PRN

## 2024-03-02 MED ORDER — STROKE: EARLY STAGES OF RECOVERY BOOK
Freq: Once | Status: AC
Start: 2024-03-02 — End: 2024-03-03

## 2024-03-02 MED ORDER — SENNOSIDES-DOCUSATE SODIUM 8.6-50 MG PO TABS: 1.0000 | ORAL_TABLET | Freq: Every evening | ORAL | Status: DC | PRN

## 2024-03-02 NOTE — Assessment & Plan Note (Signed)
Home aspirin 81 mg daily resumed

## 2024-03-02 NOTE — ED Triage Notes (Signed)
 Patient to ED via [facility] from home for right sided weakness/numbness- started after a fall 5 days ago. HX of stroke.C/o right shoulder pain
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
## 2024-03-02 NOTE — ED Notes (Signed)
 Informed RN bed assigned

## 2024-03-02 NOTE — Assessment & Plan Note (Signed)
 PDMP reviewed, currently active prescription includes gabapentin  400 mg, 270 capsules, 90-day supply was filled on 01/20/2024 Oxycodone  10 mg tablet, 60 tablets, 30-day supply was written and filled on 01/20/2024

## 2024-03-02 NOTE — Hospital Course (Signed)
 [name] is a 78 year old male with history of neuropathy, non-insulin -dependent diabetes mellitus 2, patient on Aggrenox  and aspirin , BPH, who presents ED for chief concerns of right sided weakness and imbalance.  Patient endorses right shoulder pain.  Vitals in the ED showed t of 97.7, respiration rate 17, heart rate 73, blood pressure 106/60, SpO2 100% on room air.  Serum sodium is 139, potassium 3.7, chloride 102, bicarb 26, BUN of 15, serum creatinine of 0.79, eGFR greater than 60, nonfasting blood glucose 155, WBC 6.2, hemoglobin 10.3, platelets of 261.  CT head wo contrast: Read as no acute intracranial abnormality.  Right shoulder x-ray: Osteopenia in the noted changes.  No acute finding.  ED treatment: Aspirin  324 mg p.o. one-time dose.

## 2024-03-02 NOTE — ED Triage Notes (Signed)
 Arrives from home via [facility] c/o fall 5 days ago.  Not evaluated post fall.  Since fall, patient c/o increased weakness and numbness to right side. Has history of stroke with right sided weakness residual, but now loss of sensation x 5 days.  114/55 71 99% CBG:149
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
## 2024-03-03 ENCOUNTER — Observation Stay (HOSPITAL_BASED_OUTPATIENT_CLINIC_OR_DEPARTMENT_OTHER)
Admit: 2024-03-03 | Discharge: 2024-03-03 | Disposition: A | Discharge status: Home / Self Care | Attending: Student in an Organized Health Care Education/Training Program | Admitting: Student in an Organized Health Care Education/Training Program

## 2024-03-03 ENCOUNTER — Observation Stay

## 2024-03-03 DIAGNOSIS — R2990 Unspecified symptoms and signs involving the nervous system: Secondary | ICD-10-CM | POA: Diagnosis not present

## 2024-03-03 DIAGNOSIS — I69398 Other sequelae of cerebral infarction: Secondary | ICD-10-CM

## 2024-03-03 DIAGNOSIS — G459 Transient cerebral ischemic attack, unspecified: Secondary | ICD-10-CM | POA: Diagnosis not present

## 2024-03-03 DIAGNOSIS — R29818 Other symptoms and signs involving the nervous system: Secondary | ICD-10-CM | POA: Diagnosis not present

## 2024-03-03 LAB — GLUCOSE, CAPILLARY
Glucose-Capillary: 120 mg/dL — ABNORMAL HIGH (ref 70–99)
Glucose-Capillary: 167 mg/dL — ABNORMAL HIGH (ref 70–99)
Glucose-Capillary: 138 mg/dL — ABNORMAL HIGH (ref 70–99)

## 2024-03-03 LAB — ECHOCARDIOGRAM COMPLETE
AR max vel: 3.06 cm2
AV Area VTI: 3.06 cm2
AV Area mean vel: 2.95 cm2
AV Mean grad: 2 mmHg
AV Peak grad: 4.5 mmHg
Ao pk vel: 1.06 m/s
Area-P 1/2: 3.37 cm2
Calc EF: 70.3 %
Height: 68 in
MV VTI: 2.37 cm2
S' Lateral: 2.9 cm
Single Plane A2C EF: 65.6 %
Single Plane A4C EF: 74.5 %
Weight: 2426.82 [oz_av]

## 2024-03-03 LAB — LIPID PANEL
Cholesterol: 87 mg/dL (ref 0–200)
HDL: 27 mg/dL — ABNORMAL LOW (ref >40)
LDL Cholesterol: 48 mg/dL (ref 0–99)
Total CHOL/HDL Ratio: 3.2 ratio
Triglycerides: 60 mg/dL (ref <150)
VLDL: 12 mg/dL (ref 0–40)

## 2024-03-03 LAB — HEMOGLOBIN A1C
Hgb A1c MFr Bld: 6.2 % — ABNORMAL HIGH (ref 4.8–5.6)
Mean Plasma Glucose: 131.24 mg/dL

## 2024-03-03 MED ORDER — ENSURE PLUS HIGH PROTEIN PO LIQD
237.0000 mL | Freq: Two times a day (BID) | ORAL | Status: DC
  Administered 2024-03-03: 237 mL via ORAL

## 2024-03-03 MED ORDER — PERFLUTREN LIPID MICROSPHERE
1.0000 mL | INTRAVENOUS | Status: AC | PRN
  Administered 2024-03-03: 2 mL via INTRAVENOUS

## 2024-03-03 MED ORDER — IOHEXOL 350 MG/ML SOLN
75.0000 mL | Freq: Once | INTRAVENOUS | Status: AC | PRN
  Administered 2024-03-03: 75 mL via INTRAVENOUS

## 2024-03-03 NOTE — Progress Notes (Signed)
 Chart review note  CTA head and neck shows no LVO.  Mild atheromatous changes at the origin of the left subclavian, right carotid bifurcation and left carotid bifurcation without hemodynamically significant stenosis.  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 No further inpatient recs.  Follow up outpatient 8-12 weeks.  A Kani Jobson, MD

## 2024-03-03 NOTE — Progress Notes (Deleted)
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 (A) 02/24/2024 1024    TEST NOT REPORTED DUE TO COLOR INTERFERENCE OF URINE PIGMENT   NITRITE (A) 02/24/2024 1024    TEST NOT REPORTED DUE TO COLOR INTERFERENCE OF URINE PIGMENT   LEUKOCYTESUR (A) 02/24/2024 1024    TEST NOT REPORTED DUE TO COLOR INTERFERENCE OF URINE PIGMENT   LEUKOCYTESUR Negative 09/06/2013 2150   Sepsis Labs Recent Labs  Lab 03/02/24 1307  WBC 6.2   Microbiology Recent Results (from the past 240 hours)  Urine Culture     Status: None   Collection Time: 02/24/24 10:24 AM   Specimen: Urine, Random  Result Value Ref Range Status   Specimen Description   Final    URINE, RANDOM Performed at [facility], [address], [place], [place] [address]    Special Requests   Final    NONE Reflexed from [id number] Performed at [facility], [address] [place], [place] [address]    Culture   Final    NO GROWTH Performed at [facility], [address], [place],  [place] [address]    Report Status 02/25/2024 FINAL  Final     Time coordinating discharge: 40 minutes  SIGNED:   Calvin KATHEE Robson, MD  [facility] 03/03/2024, 5:09 PM Pager   If 7PM-7AM, please contact night-coverage

## 2024-03-03 NOTE — Plan of Care (Signed)
  Problem: Education: Goal: Knowledge of disease or condition will improve Outcome: Progressing Goal: Knowledge of secondary prevention will improve (MUST DOCUMENT ALL) 03/03/2024 0431 by Harless Arland POUR, RN Outcome: Progressing 03/03/2024 0430 by Harless Arland POUR, RN Outcome: Progressing Goal: Knowledge of patient specific risk factors will improve (DELETE if not current risk factor) 03/03/2024 0431 by Harless Arland POUR, RN Outcome: Progressing 03/03/2024 0430 by Harless Arland POUR, RN Outcome: Progressing   Problem: Ischemic Stroke/TIA Tissue Perfusion: Goal: Complications of ischemic stroke/TIA will be minimized 03/03/2024 0431 by Harless Arland POUR, RN Outcome: Progressing 03/03/2024 0430 by Harless Arland POUR, RN Outcome: Progressing   Problem: Coping: Goal: Will verbalize positive feelings about self Outcome: Progressing   Problem: Coping: Goal: Will verbalize positive feelings about self Outcome: Progressing
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
## 2024-03-03 NOTE — TOC Initial Note (Signed)
 Transition of Care [facility]) - Initial/Assessment Note    Patient Details  Name: [name] [name] MRN: [id number] Date of Birth: July 26, 1945  Transition of Care [facility]) CM/SW Contact:    Dalia GORMAN Fuse, RN Phone Number: 03/03/2024, 9:33 AM  Clinical Narrative:                 Patient admitted with stroke like symptoms. Medical record reviewed and patient has no TOC needs at this time. Please outreach to [facility] if needs are identified.         Patient Goals and CMS Choice            Expected Discharge Plan and Services                                              Prior Living Arrangements/Services                       Activities of Daily Living   ADL Screening (condition at time of admission) Independently performs ADLs?: Yes (appropriate for developmental age) Is the patient deaf or have difficulty hearing?: Yes Does the patient have difficulty seeing, even when wearing glasses/contacts?: No Does the patient have difficulty concentrating, remembering, or making decisions?: No  Permission Sought/Granted                  Emotional Assessment              Admission diagnosis:  Right sided weakness [R53.1] Stroke-like symptom [R29.90] Patient Active Problem List   Diagnosis Date Noted   Stroke-like symptom 03/02/2024   AKI (acute kidney injury) 09/18/2023   Left leg cellulitis 09/17/2023   Bilateral hydronephrosis 09/17/2023   Acute urinary retention 09/17/2023   Abdominal pain 09/17/2023   Status post total hip replacement, left 07/24/2023   Ground-level fall 05/09/2023   Sinus bradycardia 05/09/2023   Chronic pain syndrome 05/09/2023   History of CVA (cerebrovascular accident) 05/10/2020   Type 2 diabetes mellitus with complication, without long-term current use of insulin  (HCC) 05/10/2020   Hx of decompressive lumbar laminectomy 05/10/2020   Bilateral carotid artery stenosis 12/21/2018   Stroke (HCC) 12/14/2018   TIA (transient  ischemic attack) 12/12/2018   Paresis of left lower extremity (HCC)    Pressure injury of skin 04/30/2018   Acute CVA (cerebrovascular accident) (HCC) 04/29/2018   Obesity (BMI 30.0-34.9) 11/11/2017   Stroke-like episode (HCC) - R brain, s/p tPA 11/09/2017   PAD (peripheral artery disease) 06/26/2016   Pain in limb 06/04/2016   Annual physical exam 05/31/2016   Cerumen impaction 05/31/2016   ERRONEOUS ENCOUNTER--DISREGARD 01/29/2016   Difficulty in swallowing    Hiatal hernia    Gastritis    Heartburn 10/11/2015   Influenza-like illness 08/18/2015   Sinusitis, acute 07/20/2015   Elevation of level of transaminase and lactic acid dehydrogenase (LDH) 05/11/2015   Back pain with radiation 05/11/2015   Cardiomyopathy (HCC) 05/11/2015   Esophageal lesion 05/11/2015   Pain in the wrist 05/11/2015   Low back pain with sciatica 05/11/2015   Heart attack (HCC) 05/11/2015   Absent peripheral pulse 05/11/2015   ERRONEOUS ENCOUNTER--DISREGARD 04/26/2015   Back pain, chronic 02/03/2015   Leg swelling 02/03/2015   Edema leg 02/03/2015   CAD in native artery 02/03/2015   Narrowing of intervertebral disc space 02/03/2015   Acid reflux 02/03/2015  Hypercholesteremia 02/03/2015   BP (high blood pressure) 02/03/2015   Cardiomyopathy, ischemic 02/03/2015   Arthritis of hand, degenerative 02/03/2015   Insomnia secondary to chronic pain 11/14/2014   History of benign esophageal tumor 11/14/2014   Diuretic-induced hypokalemia 11/14/2014   History of decompression of median nerve 10/03/2014   Acquired polyneuropathy 09/28/2014   Carpal tunnel syndrome 09/28/2014   Peripheral neuropathy 09/28/2014   Chest pain 10/20/2013   Other specified cardiac arrhythmias 08/23/2013   H/O coronary artery bypass surgery 08/21/2013   Chronic low back pain with right-sided sciatica 08/19/2013   Difficulty hearing 08/19/2013   Arteriosclerosis of coronary artery 08/19/2013   Esophageal lump 08/19/2013    Hearing impaired 08/19/2013   PCP:  Sadie Manna, MD Pharmacy:   [facility] [place] - [address] [place] [address] Phone: [phone] Fax: [phone]     Social Drivers of Health (SDOH) Social History: SDOH Screenings   Food Insecurity: No Food Insecurity (03/02/2024)  Housing: Low Risk  (03/02/2024)  Transportation Needs: No Transportation Needs (03/02/2024)  Utilities: Not At Risk (03/02/2024)  Financial Resource Strain: Low Risk  (01/20/2024)   Received from [facility]  Social Connections: Moderately Isolated (03/02/2024)  Tobacco Use: High Risk (03/02/2024)   SDOH Interventions:     Readmission Risk Interventions     No data to display

## 2024-03-03 NOTE — Evaluation (Signed)
 Occupational Therapy Evaluation Patient Details Name: ZYAD BOOMER MRN: [id number] DOB: 1945-07-02 Today's Date: 03/03/2024   History of Present Illness   Mr. Zackrey Dyar is a 78 year old male with history of neuropathy, non-insulin -dependent diabetes mellitus 2, patient on Aggrenox  and aspirin , BPH, who presents ED for chief concerns of right sided weakness and imbalance.     Clinical Impressions Patient presenting with decreased Ind in self care,balance, functional mobility/transfers, endurance, and safety awareness. Patient reports living at home with wife and mod I level with use of cane or rollator for mobility. Pt endorses being Mod I for self care tasks. Upon entering the room, pt seated in recliner chair. IV team also arrives to room and asks therapist to assist pt back to bed for them to place IV. Pt stands with min A from recliner chair and step pivot transfer without use of AD at min A. Pt performing supine >sit with min guard- min A. Patient will benefit from acute OT to increase overall independence in the areas of ADLs, functional mobility, and safety awareness in order to safely discharge.     If plan is discharge home, recommend the following:   A little help with walking and/or transfers;A little help with bathing/dressing/bathroom;Assistance with cooking/housework;Assist for transportation;Help with stairs or ramp for entrance     Functional Status Assessment   Patient has had a recent decline in their functional status and demonstrates the ability to make significant improvements in function in a reasonable and predictable amount of time.     Equipment Recommendations   None recommended by OT      Precautions/Restrictions   Precautions Precautions: Fall     Mobility Bed Mobility Overal bed mobility: Needs Assistance Bed Mobility: Sit to Supine       Sit to supine: Min assist        Transfers Overall transfer level: Needs  assistance Equipment used: 1 person hand held assist, None Transfers: Sit to/from Stand, Bed to chair/wheelchair/BSC Sit to Stand: Min assist     Step pivot transfers: Min assist            Balance Overall balance assessment: Needs assistance Sitting-balance support: Feet supported Sitting balance-Leahy Scale: Good     Standing balance support: Single extremity supported Standing balance-Leahy Scale: Fair                             ADL either performed or assessed with clinical judgement   ADL Overall ADL's : Needs assistance/impaired                         Toilet Transfer: Minimal assistance Toilet Transfer Details (indicate cue type and reason): simulated                 Vision Patient Visual Report: No change from baseline              Pertinent Vitals/Pain Pain Assessment Pain Assessment: No/denies pain     Extremity/Trunk Assessment Upper Extremity Assessment Upper Extremity Assessment: Generalized weakness;Right hand dominant;RUE deficits/detail RUE Deficits / Details: decreased shoulder elevation ~ 90 degrees secondary to soreness from fall   Lower Extremity Assessment Lower Extremity Assessment: Generalized weakness   Cervical / Trunk Assessment Cervical / Trunk Assessment: Normal   Communication Communication Communication: Impaired Factors Affecting Communication: Hearing impaired   Cognition Arousal: Alert Behavior During Therapy: WFL for tasks assessed/performed Cognition: No apparent impairments  Following commands: Intact       Cueing  General Comments   Cueing Techniques: Verbal cues;Tactile cues              Home Living Family/patient expects to be discharged to:: Private residence Living Arrangements: Spouse/significant other Available Help at Discharge: Available PRN/intermittently;Family Type of Home: House Home Access: Stairs to enter [employer] of Steps: 4 Entrance Stairs-Rails: Right;Left;Can reach both Home Layout: One level     Bathroom Shower/Tub: Tub/shower unit;Walk-in shower   Bathroom Toilet: Handicapped height     Home Equipment: Rollator (4 wheels);Cane - quad          Prior Functioning/Environment Prior Level of Function : Independent/Modified Independent             Mobility Comments: Mod Ind amb mostly with a rollator but uses a rollator PRN ADLs Comments: Indep with ADL/IADL. Will run quick errands, wife primarily does driving.    OT Problem List: Decreased strength;Impaired balance (sitting and/or standing);Decreased activity tolerance;Decreased safety awareness   OT Treatment/Interventions: Self-care/ADL training;Therapeutic exercise;Patient/family education;Balance training;Energy conservation      OT Goals(Current goals can be found in the care plan section)   Acute Rehab OT Goals Patient Stated Goal: to go home OT Goal Formulation: With patient Time For Goal Achievement: 03/17/24 Potential to Achieve Goals: Fair ADL Goals Pt Will Perform Grooming: with modified independence Pt Will Perform Lower Body Dressing: with modified independence Pt Will Transfer to Toilet: with modified independence;ambulating Pt Will Perform Toileting - Clothing Manipulation and hygiene: with modified independence;sit to/from stand   OT Frequency:  Min 1X/week       AM-PAC OT 6 Clicks Daily Activity     Outcome Measure Help from another person eating meals?: None Help from another person taking care of personal grooming?: None Help from another person toileting, which includes using toliet, bedpan, or urinal?: A Little Help from another person bathing (including washing, rinsing, drying)?: A Little Help from another person to put on and taking off regular upper body clothing?: A Little Help from another person to put on and taking off regular lower body clothing?: A Little 6 Click Score:  20   End of Session Equipment Utilized During Treatment: Rolling walker (2 wheels) Nurse Communication: Mobility status  Activity Tolerance: Patient tolerated treatment well Patient left: in bed;with call bell/phone within reach;with bed alarm set  OT Visit Diagnosis: Unsteadiness on feet (R26.81)                Time: 9075-9061 OT Time Calculation (min): 14 min Charges:  OT General Charges $OT Visit: 1 Visit OT Evaluation $OT Eval Low Complexity: 1 Low  Izetta Claude, MS, OTR/L , CBIS ascom [phone]  03/03/24, 1:55 PM

## 2024-03-03 NOTE — Plan of Care (Signed)
  Problem: Education: Goal: Knowledge of secondary prevention will improve (MUST DOCUMENT ALL) Outcome: Progressing Goal: Knowledge of patient specific risk factors will improve (DELETE if not current risk factor) Outcome: Progressing   Problem: Ischemic Stroke/TIA Tissue Perfusion: Goal: Complications of ischemic stroke/TIA will be minimized Outcome: Progressing   Problem: Coping: Goal: Will verbalize positive feelings about self Outcome: Progressing

## 2024-03-03 NOTE — Care Management Obs Status (Signed)
 MEDICARE OBSERVATION STATUS NOTIFICATION   Patient Details  Name: [name] [name] MRN: [id number] Date of Birth: 16-May-1946   Medicare Observation Status Notification Given:  Chaney BRANDY CHRISTIANE LELON, CMA 03/03/2024, 10:50 AM

## 2024-03-03 NOTE — Evaluation (Signed)
 Physical Therapy Evaluation Patient Details Name: [name] [name] MRN: [id number] DOB: [birth year] Today's Date: 03/03/2024  History of Present Illness  [name] is a 78 year old male with history of neuropathy, non-insulin -dependent diabetes mellitus 2, patient on Aggrenox  and aspirin , BPH, who presents ED for chief concerns of right sided weakness and imbalance.  Clinical Impression  Patient seen for initial PT evaluation due to decline in function. Patient is A&O x 4. Baseline mobility reported as modI requiring use of Rolator, currently requiring minA  for transfers and gait. Gait assessed with minA with no assistive device, limited by decline for further mobility. Pt lives with wife and dog. Per pt. Report that his wife is barely there. Pt has 4 steps to enter home with no rails. Pt report having continued R side numbness in both arm and leg. Clinical impression: patient presents with mild mobility limitations secondary to acute R sided weakness. Recommend skilled acute PT to address safety, mobility, and discharge planning.  PT recommend HHPT at discharge with expected improvement in mobility.       If plan is discharge home, recommend the following: A little help with walking and/or transfers;A little help with bathing/dressing/bathroom   Can travel by private vehicle        Equipment Recommendations    Recommendations for Other Services       Functional Status Assessment Patient has had a recent decline in their functional status and demonstrates the ability to make significant improvements in function in a reasonable and predictable amount of time.     Precautions / Restrictions Precautions Precautions: Fall Restrictions Weight Bearing Restrictions Per Provider Order: No      Mobility  Bed Mobility Overal bed mobility: Needs Assistance Bed Mobility: Rolling, Sidelying to Sit Rolling: Min assist Sidelying to sit: Min assist       General bed mobility  comments: decreased movement speed    Transfers Overall transfer level: Needs assistance Equipment used: Rolling walker (2 wheels) Transfers: Sit to/from Stand Sit to Stand: Min assist           General transfer comment: decreased movement speed    Ambulation/Gait Ambulation/Gait assistance: Min assist Gait Distance (Feet): 10 Feet (forward backwards walking in room) Assistive device: None Gait Pattern/deviations: Step-to pattern, Decreased stride length       General Gait Details: patient declined use of RW  Stairs            Wheelchair Mobility     Tilt Bed    Modified Rankin (Stroke Patients Only)       Balance Overall balance assessment: Needs assistance Sitting-balance support: Feet supported Sitting balance-Leahy Scale: Good     Standing balance support: Single extremity supported Standing balance-Leahy Scale: Fair Standing balance comment: required minA to stand bedside                             Pertinent Vitals/Pain Pain Assessment Pain Assessment: No/denies pain    Home Living Family/patient expects to be discharged to:: Private residence Living Arrangements: Spouse/significant other Available Help at Discharge: Available PRN/intermittently;Family Type of Home: House Home Access: Stairs to enter Entrance Stairs-Rails: Right;Left;Can reach both Entrance Stairs-Number of Steps: 4   Home Layout: One level Home Equipment: Rollator (4 wheels);Cane - quad      Prior Function Prior Level of Function : Independent/Modified Independent             Mobility Comments: Mod Ind amb mostly  with a rollator but uses a rollator PRN ADLs Comments: Indep with ADL/IADL. Will run quick errands, wife primarily does driving.     Extremity/Trunk Assessment        Lower Extremity Assessment Lower Extremity Assessment: Generalized weakness    Cervical / Trunk Assessment Cervical / Trunk Assessment: Normal  Communication    Communication Communication: No apparent difficulties Factors Affecting Communication: Hearing impaired    Cognition Arousal: Alert Behavior During Therapy: WFL for tasks assessed/performed   PT - Cognitive impairments: No apparent impairments                         Following commands: Intact       Cueing       General Comments      Exercises     Assessment/Plan    PT Assessment Patient needs continued PT services  PT Problem List Decreased strength;Decreased range of motion;Decreased activity tolerance;Decreased balance;Decreased mobility;Decreased safety awareness;Decreased knowledge of precautions       PT Treatment Interventions DME instruction;Gait training;Functional mobility training;Therapeutic activities;Therapeutic exercise;Balance training;Neuromuscular re-education;Patient/family education    PT Goals (Current goals can be found in the Care Plan section)  Acute Rehab PT Goals PT Goal Formulation: With patient Time For Goal Achievement: 03/17/24 Potential to Achieve Goals: Good    Frequency Min 2X/week     Co-evaluation               AM-PAC PT 6 Clicks Mobility  Outcome Measure Help needed turning from your back to your side while in a flat bed without using bedrails?: A Little Help needed moving from lying on your back to sitting on the side of a flat bed without using bedrails?: A Little Help needed moving to and from a bed to a chair (including a wheelchair)?: A Little Help needed standing up from a chair using your arms (e.g., wheelchair or bedside chair)?: A Little Help needed to walk in hospital room?: A Little Help needed climbing 3-5 steps with a railing? : A Lot 6 Click Score: 17    End of Session Equipment Utilized During Treatment: Gait belt Activity Tolerance: Patient tolerated treatment well;Other (comment) (treatment limited to decline for further activity)   Nurse Communication: Other (comment);Mobility status PT  Visit Diagnosis: Unsteadiness on feet (R26.81);Other abnormalities of gait and mobility (R26.89);Difficulty in walking, not elsewhere classified (R26.2);Muscle weakness (generalized) (M62.81)    Time: 9151-9090 PT Time Calculation (min) (ACUTE ONLY): 21 min   Charges:   PT Evaluation $PT Eval Low Complexity: 1 Low   PT General Charges $$ ACUTE PT VISIT: 1 Visit         Sherlean Lesches DPT, PT    Sherlean A Jamarl Pew 03/03/2024, 10:53 AM

## 2024-03-03 NOTE — Discharge Summary (Signed)
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 REPORTED DUE TO COLOR INTERFERENCE OF URINE PIGMENT   PROTEINUR (A) 02/24/2024 1024    TEST NOT REPORTED DUE TO COLOR INTERFERENCE OF URINE PIGMENT   NITRITE (A) 02/24/2024 1024    TEST NOT REPORTED DUE TO COLOR INTERFERENCE OF URINE PIGMENT   LEUKOCYTESUR (A) 02/24/2024 1024    TEST NOT REPORTED DUE TO COLOR INTERFERENCE OF URINE PIGMENT   LEUKOCYTESUR Negative 09/06/2013 2150   Sepsis Labs Recent Labs  Lab 03/02/24 1307  WBC 6.2   Microbiology Recent Results (from the past 240 hours)  Urine Culture     Status: None   Collection Time: 02/24/24 10:24 AM   Specimen: Urine, Random  Result Value Ref Range Status   Specimen Description   Final    URINE, RANDOM Performed at [facility], [address], [place], [place] [address]    Special Requests   Final    NONE Reflexed from [phone] [phone] Performed at [facility], [address] [place], [place] [address]    Culture   Final    NO  GROWTH Performed at [facility], [address], [place], [place] [address]    Report Status 02/25/2024 FINAL  Final     Time coordinating discharge: 40 minutes  SIGNED:   Calvin KATHEE Robson, MD  [facility] 03/03/2024, 7:18 PM Pager   If 7PM-7AM, please contact night-coverage

## 2024-05-05 ENCOUNTER — Ambulatory Visit: Admitting: Urology

## 2024-05-05 DIAGNOSIS — N138 Other obstructive and reflux uropathy: Secondary | ICD-10-CM | POA: Diagnosis not present

## 2024-05-05 DIAGNOSIS — N401 Enlarged prostate with lower urinary tract symptoms: Secondary | ICD-10-CM | POA: Diagnosis not present

## 2024-05-05 LAB — BLADDER SCAN AMB NON-IMAGING

## 2024-05-05 NOTE — Progress Notes (Signed)
   05/05/2024 12:06 PM   [name] [name] [birth year] [id number]  Reason for visit: Follow up BPH/overflow incontinence/hydronephrosis status post HOLEP  History: Both he and his wife are very challenging historians Presented with overflow incontinence, massive bladder distention, and bilateral hydronephrosis, opted for HOLEP Underwent HOLEP 01/2024 removal of 26 g benign tissue  Today: Overall doing well, urinary symptoms have improved significantly.  Urinating with a better stream, incontinence has resolved during the day, mild leakage overnight, wearing a depends mostly for safety.  Bladder scan today , but last void was ~2 hours ago  Plan:   BPH/nocturia: Emptying improved after HOLEP, incontinence improved significantly, with his history of hydronephrosis will order renal ultrasound at follow-up to confirm resolution.  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  Return precautions were discussed at length RTC 4 months PVR, renal ultrasound prior to confirm resolution of prior hydronephrosis  [name] JAYSON Burnet, MD  [facility] [address] [place], [place] [address] [phone]

## 2024-05-15 ENCOUNTER — Other Ambulatory Visit: Payer: Self-pay

## 2024-05-15 ENCOUNTER — Emergency Department
Admission: EM | Admit: 2024-05-15 | Discharge: 2024-05-15 | Disposition: A | Discharge status: Home / Self Care | Attending: Emergency Medicine | Admitting: Emergency Medicine

## 2024-05-15 ENCOUNTER — Emergency Department

## 2024-05-15 DIAGNOSIS — I10 Essential (primary) hypertension: Secondary | ICD-10-CM | POA: Insufficient documentation

## 2024-05-15 DIAGNOSIS — S20222A Contusion of left back wall of thorax, initial encounter: Secondary | ICD-10-CM | POA: Insufficient documentation

## 2024-05-15 DIAGNOSIS — I2510 Atherosclerotic heart disease of native coronary artery without angina pectoris: Secondary | ICD-10-CM | POA: Insufficient documentation

## 2024-05-15 DIAGNOSIS — M542 Cervicalgia: Secondary | ICD-10-CM | POA: Insufficient documentation

## 2024-05-15 DIAGNOSIS — E119 Type 2 diabetes mellitus without complications: Secondary | ICD-10-CM | POA: Insufficient documentation

## 2024-05-15 DIAGNOSIS — S0990XA Unspecified injury of head, initial encounter: Secondary | ICD-10-CM | POA: Insufficient documentation

## 2024-05-15 DIAGNOSIS — W01198A Fall on same level from slipping, tripping and stumbling with subsequent striking against other object, initial encounter: Secondary | ICD-10-CM | POA: Insufficient documentation

## 2024-05-15 DIAGNOSIS — W19XXXA Unspecified fall, initial encounter: Secondary | ICD-10-CM

## 2024-05-15 LAB — CBC
HCT: 31.6 % — ABNORMAL LOW (ref 39.0–52.0)
Hemoglobin: 10.7 g/dL — ABNORMAL LOW (ref 13.0–17.0)
MCH: 30.6 pg (ref 26.0–34.0)
MCHC: 33.9 g/dL (ref 30.0–36.0)
MCV: 90.3 fL (ref 80.0–100.0)
Platelets: 184 K/uL (ref 150–400)
RBC: 3.5 MIL/uL — ABNORMAL LOW (ref 4.22–5.81)
RDW: 13.9 % (ref 11.5–15.5)
WBC: 6.9 K/uL (ref 4.0–10.5)
nRBC: 0 % (ref 0.0–0.2)

## 2024-05-15 LAB — BASIC METABOLIC PANEL WITH GFR
Anion gap: 7 (ref 5–15)
BUN: 22 mg/dL (ref 8–23)
CO2: 28 mmol/L (ref 22–32)
Calcium: 9.8 mg/dL (ref 8.9–10.3)
Chloride: 105 mmol/L (ref 98–111)
Creatinine, Ser: 0.7 mg/dL (ref 0.61–1.24)
GFR, Estimated: >60 mL/min (ref >60)
Glucose, Bld: 100 mg/dL — ABNORMAL HIGH (ref 70–99)
Potassium: 4.2 mmol/L (ref 3.5–5.1)
Sodium: 140 mmol/L (ref 135–145)

## 2024-05-15 MED ORDER — SODIUM CHLORIDE 0.9 % IV BOLUS
500.0000 mL | Freq: Once | INTRAVENOUS | Status: AC
  Administered 2024-05-15: 500 mL via INTRAVENOUS

## 2024-05-15 NOTE — ED Provider Notes (Signed)
 [facility] Provider Note    Event Date/Time   First MD Initiated Contact with Patient 05/15/24 1041     (approximate)   History   Fall   HPI  [name] [name] is a 78 y.o. male with a history of diabetes, CAD, hypertension who presents after a fall.  Patient reports he uses a rollator to get around, lost his balance fell backwards hit his head, complains of mild neck tension, thinks he may have also bumped the back of his left chest.  No neurodeficits.  Not on blood thinners.     Physical Exam   Triage Vital Signs: ED Triage Vitals  Encounter Vitals Group     BP 05/15/24 1042 (!) 130/55     Girls Systolic BP Percentile --      Girls Diastolic BP Percentile --      Boys Systolic BP Percentile --      Boys Diastolic BP Percentile --      Pulse Rate 05/15/24 1042 (!) 56     Resp 05/15/24 1042 16     Temp 05/15/24 1042 97.9 F (36.6 C)     Temp Source 05/15/24 1042 Oral     SpO2 05/15/24 1042 100 %     Weight 05/15/24 1042 70.3 kg (155 lb)     Height 05/15/24 1042 1.727 m (5' 8)     Head Circumference --      Peak Flow --      Pain Score 05/15/24 1057 8     Pain Loc --      Pain Education --      Exclude from Growth Chart --     Most recent vital signs: Vitals:   05/15/24 1315 05/15/24 1334  BP:  112/62  Pulse: 62   Resp: 16   Temp:    SpO2: 100%      General: Awake, no distress.  No hematoma CV:  Good peripheral perfusion.  Resp:  Normal effort.  Abd:  No distention.  Other:  No vertebral tenderness to palpation, mild left posterior lateral tenderness, no bony normalities palpated, no bruising, no rash  Good strength in the upper and lower extremities,, normal neurologic exam   ED Results / Procedures / Treatments   Labs (all labs ordered are listed, but only abnormal results are displayed) Labs Reviewed  CBC - Abnormal; Notable for the following components:      Result Value   RBC 3.50 (*)    Hemoglobin 10.7 (*)     HCT 31.6 (*)    All other components within normal limits  BASIC METABOLIC PANEL WITH GFR - Abnormal; Notable for the following components:   Glucose, Bld 100 (*)    All other components within normal limits     EKG     RADIOLOGY CT head viewed by me, no ICH noted, pending radiology review    PROCEDURES:  Critical Care performed:   Procedures   MEDICATIONS ORDERED IN ED: Medications  sodium chloride  0.9 % bolus 500 mL (0 mLs Intravenous Stopped 05/15/24 1300)     IMPRESSION / MDM / ASSESSMENT AND PLAN / ED COURSE  I reviewed the triage vital signs and the nursing notes. Patient's presentation is most consistent with acute presentation with potential threat to life or bodily function.   Patient presents after a fall with head injury.  Complains of mild neck pain as well as back pain as described above.  Differential includes minor head injury, ICH,  concussion, fracture  Lab work is overall reassuring, his anemia is chronic  Imaging obtained including CT head and cervical spine which are unremarkable.  X-rays of the ribs on the left as well as the thoracic spine are unremarkable  He is reassured by this appropriate for discharge at this time, outpatient follow-up as needed, return precautions discussed       FINAL CLINICAL IMPRESSION(S) / ED DIAGNOSES   Final diagnoses:  Fall, initial encounter  Minor head injury, initial encounter  Contusion of left back wall of thorax, initial encounter     Rx / DC Orders   ED Discharge Orders     None        Note:  This document was prepared using Dragon voice recognition software and may include unintentional dictation errors.   Arlander Charleston, MD 05/15/24 [phone]

## 2024-05-15 NOTE — ED Triage Notes (Addendum)
 Pt from home via EMS for trip over rug and fall. Pt hit his head- no LOC no thinners. Pt c/o right sided headache and lower left back pain. Pt states he fell last Sunday as well. No bleeding or obvious deformity noted.

## 2024-05-15 NOTE — ED Notes (Signed)
 Patient transported to CT

## 2024-06-28 ENCOUNTER — Other Ambulatory Visit: Payer: Self-pay

## 2024-07-09 ENCOUNTER — Emergency Department

## 2024-07-09 ENCOUNTER — Emergency Department
Admission: EM | Admit: 2024-07-09 | Discharge: 2024-07-09 | Disposition: A | Discharge status: Home / Self Care | Source: Home / Self Care | Attending: Emergency Medicine | Admitting: Emergency Medicine

## 2024-07-09 DIAGNOSIS — W19XXXA Unspecified fall, initial encounter: Secondary | ICD-10-CM

## 2024-07-09 DIAGNOSIS — S32040A Wedge compression fracture of fourth lumbar vertebra, initial encounter for closed fracture: Secondary | ICD-10-CM

## 2024-07-09 MED ORDER — OXYCODONE-ACETAMINOPHEN 5-325 MG PO TABS
1.0000 | ORAL_TABLET | Freq: Once | ORAL | Status: AC
  Administered 2024-07-09: 1 via ORAL
  Filled 2024-07-09: qty 1

## 2024-07-09 MED ORDER — OXYCODONE-ACETAMINOPHEN 5-325 MG PO TABS: 1.0000 | ORAL_TABLET | Freq: Four times a day (QID) | ORAL | 0 refills | Status: AC | PRN

## 2024-07-09 MED ORDER — OXYCODONE-ACETAMINOPHEN 5-325 MG PO TABS
2.0000 | ORAL_TABLET | Freq: Once | ORAL | Status: DC
  Filled 2024-07-09: qty 2

## 2024-07-09 NOTE — ED Notes (Signed)
 Bed alarm on, fall risk bracelet on, grip socks on

## 2024-07-09 NOTE — Discharge Instructions (Addendum)
 Your evaluated in the ED for back pain following a mechanical fall.  Your CT lumbar scan shows a mild superior endplate compression fracture deformity at L4, favored to be acute, with minimal retropulsion.  This is treated with pain control NSAIDs, limited activity and prevention of further falls.  Please follow-up with neurosurgery for further evaluation.  Call and schedule an appointment with Dr. Penne Sharps.  Pain control:  Ibuprofen (motrin/aleve/advil) - You can take 3 tablets (600 mg) every 6 hours as needed for pain/fever.  Acetaminophen  (tylenol ) - You can take 2 extra strength tablets (1000 mg) every 6 hours as needed for pain/fever.  You can alternate these medications or take them together.  Make sure you eat food/drink water  when taking these medications.

## 2024-07-09 NOTE — ED Triage Notes (Signed)
 Pt complains of back pain after falling 10 days ago. Pt pain is lower and goes up spine. Hx of chronic back pain. Oxycodone  is not helping.

## 2024-07-09 NOTE — ED Triage Notes (Signed)
 First nurse note: pt to ED [facility] from home for fall on 1/26. C/o left lower back pain. Hx chronic back pain. Has been taking oxycodone  at home but does not want to get addicted.

## 2024-07-09 NOTE — ED Provider Triage Note (Signed)
 Emergency Medicine Provider Triage Evaluation Note  [name] [name] , a 79 y.o. male  was evaluated in triage.  Pt complains of back pain 10 days after mechanical, non-syncopal fall. No relief with oxycodone . Has not had imaging post fall.   Physical Exam  BP 123/69   Pulse 76   Temp 98 F (36.7 C)   Resp 18   Wt 70.3 kg   SpO2 100%   BMI 23.57 kg/m  Gen:   Awake, no distress   Resp:  Normal effort  MSK:   Moves extremities without difficulty  Other:    Medical Decision Making  Medically screening exam initiated at 3:58 PM.  Appropriate orders placed.  [name] [name] was informed that the remainder of the evaluation will be completed by another provider, this initial triage assessment does not replace that evaluation, and the importance of remaining in the ED until their evaluation is complete.  Imaging ordered.   [name] Kirk NOVAK, FNP 07/09/24 1559

## 2024-07-09 NOTE — ED Provider Notes (Signed)
 "   [name] [name] [facility] Emergency Department Provider Note     Event Date/Time   First MD Initiated Contact with Patient 07/09/24 1731     (approximate)   History   Back Pain   HPI  [name] [name] is [name] 79 y.o. male presents to the ED for evaluation of [name] mechanical fall 10 days ago.  Patient reports he fell down [name] couple steps and now is having lower back pain that is not relieved with oxycodone .  Patient reports when he walks he feels [name] radiation down his right leg.  He denies head injury.  No LOC.  No other complaint.     Physical Exam   Triage Vital Signs: ED Triage Vitals  Encounter Vitals Group     BP 07/09/24 1555 123/69     Girls Systolic BP Percentile --      Girls Diastolic BP Percentile --      Boys Systolic BP Percentile --      Boys Diastolic BP Percentile --      Pulse Rate 07/09/24 1555 76     Resp 07/09/24 1555 18     Temp 07/09/24 1555 98 F (36.7 C)     Temp src --      SpO2 07/09/24 1555 100 %     Weight 07/09/24 1556 154 lb 15.7 oz (70.3 kg)     Height --      Head Circumference --      Peak Flow --      Pain Score 07/09/24 1555 8     Pain Loc --      Pain Education --      Exclude from Growth Chart --     Most recent vital signs: Vitals:   07/09/24 1555  BP: 123/69  Pulse: 76  Resp: 18  Temp: 98 F (36.7 C)  SpO2: 100%    General Awake, no distress.  HEENT NCAT.  CV:  Good peripheral perfusion.  RESP:  Normal effort.  ABD:  No distention.  Other:  Midline tenderness to lumbar region.  Normal neuroexam.  No neurodeficits.   ED Results / Procedures / Treatments   Labs (all labs ordered are listed, but only abnormal results are displayed) Labs Reviewed - No data to display  RADIOLOGY  I personally viewed and evaluated these images as part of my medical decision making, as well as reviewing the written report by the radiologist.  CT Lumbar Spine Wo Contrast Result Date: 07/09/2024 EXAM: CT OF THE LUMBAR  SPINE WITHOUT CONTRAST 07/09/2024 06:02:56 PM TECHNIQUE: CT of the lumbar spine was performed without the administration of intravenous contrast. Multiplanar reformatted images are provided for review. Automated exposure control, iterative reconstruction, and/or weight based adjustment of the mA/kV was utilized to reduce the radiation dose to as low as reasonably achievable. COMPARISON: CT abdomen/pelvis dated 09/26/2023. CLINICAL HISTORY: Lumbar radiculopathy, trauma. FINDINGS: BONES AND ALIGNMENT: Normal vertebral body heights except for L4. Mild superior endplate compression fracture deformity at L4, with 20% loss of height, new from prior and favored to be acute (sagittal 57). Minimal retropulsion (sagittal image 51). Normal alignment. No suspicious bone lesion. DEGENERATIVE CHANGES: Degenerative changes of the visualized thoracolumbar spine, most prominent at L5-S1. SOFT TISSUES: Vascular calcifications. Trabeculated bladder, incompletely visualized. Sigmoid diverticulosis, without evidence of diverticulitis. Moderate bilateral hydroureteronephrosis, favored to reflect neurogenic bladder versus chronic bladder outlet obstruction. Calcified splenic granulomata. Status post cholecystectomy. No acute abnormality. IMPRESSION: 1. Mild superior endplate compression fracture deformity at  L4, favored to be acute, with minimal retropulsion. 2. Moderate bilateral hydroureteronephrosis with trabeculated bladder, favoring neurogenic bladder versus chronic bladder outlet obstruction. Electronically signed by: [name] Pebbles MD 07/09/2024 06:09 PM EST RP Workstation: [id number]    PROCEDURES:  Critical Care performed: No  Procedures   MEDICATIONS ORDERED IN ED: Medications  oxyCODONE -acetaminophen  (PERCOCET/ROXICET) 5-325 MG per tablet 1 tablet (1 tablet Oral Given 07/09/24 1809)     IMPRESSION / MDM / ASSESSMENT AND PLAN / ED COURSE  I reviewed the triage vital signs and the nursing notes.                               Clinical Course as of 07/09/24 1836  [name] [name] Jul 09, 2024  1824 CT Lumbar Spine Wo Contrast IMPRESSION: 1. Mild superior endplate compression fracture deformity at L4, favored to be acute, with minimal retropulsion. 2. Moderate bilateral hydroureteronephrosis with trabeculated bladder, favoring neurogenic bladder versus chronic bladder outlet obstruction.   [MH]    Clinical Course User Index [MH] [name] Rebbeca LABOR, PA-C    80 y.o. male presents to the emergency department for evaluation and treatment of mechanical fall sustaining low back pain. See HPI for further details.   Differential diagnosis includes, but is not limited to fracture, dislocation, strain  Patient's presentation is most consistent with acute complicated illness / injury requiring diagnostic workup.  Patient is alert and oriented.  He is hemodynamic stable.  Normal neuroexam.  CT scan reveals mild superior endplate compression fracture with minimal retropulsion.  Percocet provided for pain.  Patient is advised to follow-up closely with neurosurgery for further evaluation.  He is in stable condition for discharge home.  ED return precaution discussed.  FINAL CLINICAL IMPRESSION(S) / ED DIAGNOSES   Final diagnoses:  Compression fracture of L4 lumbar vertebra, closed, initial encounter (HCC)  Fall, initial encounter   Rx / DC Orders   ED Discharge Orders          Ordered    oxyCODONE -acetaminophen  (PERCOCET/ROXICET) 5-325 MG tablet  Every 6 hours PRN        07/09/24 1824           Note:  This document was prepared using Dragon voice recognition software and may include unintentional dictation errors.    [name], [name] Lavis A, PA-C 07/09/24 1836    [name] Pae, MD 07/09/24 2355  "

## 2024-09-01 ENCOUNTER — Ambulatory Visit: Admitting: Urology
